# Patient Record
Sex: Female | Born: 1940 | Race: White | Hispanic: No | Smoking: Never smoker
Health system: Southern US, Community
[De-identification: ages and names within clinical notes are randomized; demographics above are authoritative.]

## PROBLEM LIST (undated history)

## (undated) DIAGNOSIS — I10 Essential (primary) hypertension: Secondary | ICD-10-CM

## (undated) DIAGNOSIS — I2510 Atherosclerotic heart disease of native coronary artery without angina pectoris: Secondary | ICD-10-CM

## (undated) DIAGNOSIS — T7840XA Allergy, unspecified, initial encounter: Secondary | ICD-10-CM

## (undated) DIAGNOSIS — E119 Type 2 diabetes mellitus without complications: Secondary | ICD-10-CM

## (undated) DIAGNOSIS — I5020 Unspecified systolic (congestive) heart failure: Secondary | ICD-10-CM

## (undated) DIAGNOSIS — R011 Cardiac murmur, unspecified: Secondary | ICD-10-CM

## (undated) DIAGNOSIS — Z923 Personal history of irradiation: Secondary | ICD-10-CM

## (undated) DIAGNOSIS — C50919 Malignant neoplasm of unspecified site of unspecified female breast: Secondary | ICD-10-CM

## (undated) DIAGNOSIS — B019 Varicella without complication: Secondary | ICD-10-CM

## (undated) HISTORY — DX: Varicella without complication: B01.9

## (undated) HISTORY — DX: Type 2 diabetes mellitus without complications: E11.9

## (undated) HISTORY — DX: Atherosclerotic heart disease of native coronary artery without angina pectoris: I25.10

## (undated) HISTORY — DX: Unspecified systolic (congestive) heart failure: I50.20

## (undated) HISTORY — DX: Allergy, unspecified, initial encounter: T78.40XA

## (undated) HISTORY — DX: Essential (primary) hypertension: I10

## (undated) HISTORY — PX: CARDIAC CATHETERIZATION: SHX172

## (undated) HISTORY — DX: Cardiac murmur, unspecified: R01.1

---

## 2014-07-15 ENCOUNTER — Telehealth: Payer: Self-pay | Admitting: *Deleted

## 2014-07-15 ENCOUNTER — Encounter: Payer: Self-pay | Admitting: Cardiovascular Disease

## 2014-07-15 ENCOUNTER — Ambulatory Visit (INDEPENDENT_AMBULATORY_CARE_PROVIDER_SITE_OTHER): Payer: 59 | Admitting: Cardiovascular Disease

## 2014-07-15 VITALS — BP 198/102 | HR 82 | Ht 65.0 in | Wt 165.8 lb

## 2014-07-15 DIAGNOSIS — I10 Essential (primary) hypertension: Secondary | ICD-10-CM | POA: Insufficient documentation

## 2014-07-15 DIAGNOSIS — R079 Chest pain, unspecified: Secondary | ICD-10-CM

## 2014-07-15 MED ORDER — AMLODIPINE BESYLATE 5 MG PO TABS: 5.0000 mg | ORAL_TABLET | Freq: Every day | ORAL | Status: DC

## 2014-07-15 NOTE — Assessment & Plan Note (Signed)
The patient has exertional chest pain and shortness of breath which is worrisome for new onset angina. She does have nonspecific ST depression on EKG. She is not able to exercise on a treadmill and thus I requested a pharmacologic nuclear stress test.

## 2014-07-15 NOTE — Patient Instructions (Addendum)
[place]  Your caregiver has ordered a Stress Test with nuclear imaging. The purpose of this test is to evaluate the blood supply to your heart muscle. This procedure is referred to as a "Non-Invasive Stress Test." This is because other than having an IV started in your vein, nothing is inserted or "invades" your body. Cardiac stress tests are done to find areas of poor blood flow to the heart by determining the extent of coronary artery disease (CAD). Some patients exercise on a treadmill, which naturally increases the blood flow to your heart, while others who are  unable to walk on a treadmill due to physical limitations have a pharmacologic/chemical stress agent called Lexiscan . This medicine will mimic walking on a treadmill by temporarily increasing your coronary blood flow.   Please note: these test may take anywhere between 2-4 hours to complete  PLEASE REPORT TO [facility] AT THE FIRST DESK WILL DIRECT YOU WHERE TO GO  Date of Procedure:______12/08/15_______________________________  Arrival Time for Procedure:_________0945 am_____________________  Instructions regarding medication:     PLEASE NOTIFY THE OFFICE AT LEAST 24 HOURS IN ADVANCE IF YOU ARE UNABLE TO KEEP YOUR APPOINTMENT.  [phone] AND  PLEASE NOTIFY NUCLEAR MEDICINE AT [facility] AT LEAST 24 HOURS IN ADVANCE IF YOU ARE UNABLE TO KEEP YOUR APPOINTMENT. [phone]  How to prepare for your Myoview test:  1. Do not eat or drink after midnight 2. No caffeine for 24 hours prior to test 3. No smoking 24 hours prior to test. 4. Your medication may be taken with water.  If your doctor stopped a medication because of this test, do not take that medication. 5. Ladies, please do not wear dresses.  Skirts or pants are appropriate. Please wear a short sleeve shirt. 6. No perfume, cologne or lotion. 7. Wear comfortable walking shoes. No heels!       Your physician has recommended you make the  following change in your medication:  Start Amlodipine 5 mg once daily   Your physician wants you to follow-up in: After your stress test

## 2014-07-15 NOTE — Assessment & Plan Note (Signed)
Her blood pressure is significantly elevated. This might be contributing to her chest pain. I added amlodipine 5 mg once daily. She is to follow-up after stress test.

## 2014-07-15 NOTE — Telephone Encounter (Signed)
LVM to schedule patient for follow up appt after stress test with Dr. Fletcher Anon to assess her blood pressure

## 2014-07-15 NOTE — Progress Notes (Signed)
  Primary care physician: Dr. Laurian Brim  HPI  This is a 73 year old female who was referred for evaluation of chest pain. She has no previous cardiac history. She has known history of hypertension. She is not sure about hyperlipidemia. She is not diabetic and there is no history of smoking. She has no family history of coronary artery disease. Over the last 2 weeks, she has experienced recurrent episode of substernal chest tightness with activities but not all the times. It lasts 2-5 minutes and it goes away with rest. It is associated with dyspnea.  She denies orthopnea, PND or lower extremity edema. She reports no previous cardiac workup. She has no chest pain at rest.   No Known Allergies   No current outpatient prescriptions on file prior to visit.   No current facility-administered medications on file prior to visit.     Past Medical History  Diagnosis Date  . Hypertension   . Heart murmur      History reviewed. No pertinent past surgical history.   Family History  Problem Relation Age of Onset  . Heart failure Mother      History   Social History  . Marital Status: Widowed    Spouse Name: N/A    Number of Children: N/A  . Years of Education: N/A   Occupational History  . Not on file.   Social History Main Topics  . Smoking status: Never Smoker   . Smokeless tobacco: Not on file  . Alcohol Use: No  . Drug Use: No  . Sexual Activity: Not on file   Other Topics Concern  . Not on file   Social History Narrative  . No narrative on file     ROS A 10 point review of system was performed. It is negative other than that mentioned in the history of present illness.   PHYSICAL EXAM   BP 198/102 mmHg  Pulse 82  Ht 5\' 5"  (1.651 m)  Wt 165 lb 12 oz (75.184 kg)  BMI 27.58 kg/m2 Constitutional: She is oriented to person, place, and time. She appears well-developed and well-nourished. No distress.  HENT: No nasal discharge.  Head: Normocephalic and  atraumatic.  Eyes: Pupils are equal and round. No discharge.  Neck: Normal range of motion. Neck supple. No JVD present. No thyromegaly present.  Cardiovascular: Normal rate, regular rhythm, normal heart sounds. Exam reveals no gallop and no friction rub. No murmur heard.  Pulmonary/Chest: Effort normal and breath sounds normal. No stridor. No respiratory distress. She has no wheezes. She has no rales. She exhibits no tenderness.  Abdominal: Soft. Bowel sounds are normal. She exhibits no distension. There is no tenderness. There is no rebound and no guarding.  Musculoskeletal: Normal range of motion. She exhibits no edema and no tenderness.  Neurological: She is alert and oriented to person, place, and time. Coordination normal.  Skin: Skin is warm and dry. No rash noted. She is not diaphoretic. No erythema. No pallor.  Psychiatric: She has a normal mood and affect. Her behavior is normal. Judgment and thought content normal.     [id number]  Rhythm  -Nonspecific ST depression  -Nondiagnostic.   ABNORMAL     ASSESSMENT AND PLAN

## 2014-07-19 ENCOUNTER — Ambulatory Visit: Payer: Self-pay | Admitting: Cardiovascular Disease

## 2014-07-19 DIAGNOSIS — R079 Chest pain, unspecified: Secondary | ICD-10-CM

## 2014-07-20 ENCOUNTER — Other Ambulatory Visit: Payer: Self-pay

## 2014-07-20 DIAGNOSIS — R079 Chest pain, unspecified: Secondary | ICD-10-CM

## 2014-08-11 ENCOUNTER — Encounter: Payer: Self-pay | Admitting: Cardiovascular Disease

## 2014-08-11 ENCOUNTER — Ambulatory Visit (INDEPENDENT_AMBULATORY_CARE_PROVIDER_SITE_OTHER): Payer: Medicare PPO | Admitting: Cardiovascular Disease

## 2014-08-11 VITALS — BP 158/86 | HR 83 | Ht 66.0 in | Wt 163.5 lb

## 2014-08-11 DIAGNOSIS — R0789 Other chest pain: Secondary | ICD-10-CM

## 2014-08-11 DIAGNOSIS — I10 Essential (primary) hypertension: Secondary | ICD-10-CM

## 2014-08-11 NOTE — Assessment & Plan Note (Signed)
She reports resolution of chest pain. Nuclear stress test was normal. Continue observation.

## 2014-08-11 NOTE — Progress Notes (Signed)
Primary care physician: Dr. Laurian Brim  HPI  This is a 73 year old female who is here today for follow-up visit regarding chest pain. She has no previous cardiac history. She has known history of hypertension. She is not sure about hyperlipidemia. She is not diabetic and there is no history of smoking. She has no family history of coronary artery disease. She was seen recently for recurrent episode of substernal chest tightness with activities but not all the times.  She was noted to be severely hypertensive with systolic blood pressure greater than 190. I added amlodipine 5 mg once daily. She underwent a pharmacologic nuclear stress test which showed no evidence of ischemia with normal ejection fraction. She reports feeling better and resolution of chest pain.  No Known Allergies   Current Outpatient Prescriptions on File Prior to Visit  Medication Sig Dispense Refill  . amLODipine (NORVASC) 5 MG tablet Take 1 tablet (5 mg total) by mouth daily. 180 tablet 3  . aspirin 81 MG tablet Take 81 mg by mouth daily.    Marland Kitchen atenolol (TENORMIN) 50 MG tablet Take 50 mg by mouth 2 (two) times daily.  3  . hydrochlorothiazide (HYDRODIURIL) 25 MG tablet Take 25 mg by mouth daily.     No current facility-administered medications on file prior to visit.     Past Medical History  Diagnosis Date  . Hypertension   . Heart murmur      History reviewed. No pertinent past surgical history.   Family History  Problem Relation Age of Onset  . Heart failure Mother      History   Social History  . Marital Status: Married    Spouse Name: N/A    Number of Children: N/A  . Years of Education: N/A   Occupational History  . Not on file.   Social History Main Topics  . Smoking status: Never Smoker   . Smokeless tobacco: Not on file  . Alcohol Use: No  . Drug Use: No  . Sexual Activity: Not on file   Other Topics Concern  . Not on file   Social History Narrative     ROS A 10 point  review of system was performed. It is negative other than that mentioned in the history of present illness.   PHYSICAL EXAM   BP 158/86 mmHg  Pulse 83  Ht 5\' 6"  (1.676 m)  Wt 163 lb 8 oz (74.163 kg)  BMI 26.40 kg/m2 Constitutional: She is oriented to person, place, and time. She appears well-developed and well-nourished. No distress.  HENT: No nasal discharge.  Head: Normocephalic and atraumatic.  Eyes: Pupils are equal and round. No discharge.  Neck: Normal range of motion. Neck supple. No JVD present. No thyromegaly present.  Cardiovascular: Normal rate, regular rhythm, normal heart sounds. Exam reveals no gallop and no friction rub. No murmur heard.  Pulmonary/Chest: Effort normal and breath sounds normal. No stridor. No respiratory distress. She has no wheezes. She has no rales. She exhibits no tenderness.  Abdominal: Soft. Bowel sounds are normal. She exhibits no distension. There is no tenderness. There is no rebound and no guarding.  Musculoskeletal: Normal range of motion. She exhibits no edema and no tenderness.  Neurological: She is alert and oriented to person, place, and time. Coordination normal.  Skin: Skin is warm and dry. No rash noted. She is not diaphoretic. No erythema. No pallor.  Psychiatric: She has a normal mood and affect. Her behavior is normal. Judgment and thought content normal.  ASSESSMENT AND PLAN

## 2014-08-11 NOTE — Assessment & Plan Note (Signed)
Blood pressure improved significantly after the addition of amlodipine. Continue atenolol and hydrochlorothiazide as well.

## 2014-08-11 NOTE — Patient Instructions (Signed)
Continue same medications.   Your physician wants you to follow-up in: 6 months.  You will receive a reminder letter in the mail two months in advance. If you don't receive a letter, please call our office to schedule the follow-up appointment.  

## 2015-01-18 ENCOUNTER — Ambulatory Visit (INDEPENDENT_AMBULATORY_CARE_PROVIDER_SITE_OTHER): Payer: Medicare PPO | Admitting: Cardiovascular Disease

## 2015-01-18 ENCOUNTER — Encounter: Payer: Self-pay | Admitting: Cardiovascular Disease

## 2015-01-18 VITALS — BP 148/86 | HR 70 | Ht 66.0 in | Wt 167.5 lb

## 2015-01-18 DIAGNOSIS — R0789 Other chest pain: Secondary | ICD-10-CM | POA: Diagnosis not present

## 2015-01-18 DIAGNOSIS — I10 Essential (primary) hypertension: Secondary | ICD-10-CM

## 2015-01-18 NOTE — Assessment & Plan Note (Signed)
Blood pressure is reasonably controlled on current medications. It is slightly elevated. We can consider adding an ACE inhibitor/ARB or switching atenolol to carvedilol.

## 2015-01-18 NOTE — Patient Instructions (Signed)
Medication Instructions: Stop taking Aspirin. Continue other medications.   Labwork: None.   Procedures/Testing: None.   Follow-Up: 6 months with Dr. Fletcher Anon  Any Additional Special Instructions Will Be Listed Below (If Applicable).

## 2015-01-18 NOTE — Progress Notes (Signed)
Primary care physician: Dr. Laurian Brim  HPI  This is a 74 year old female who is here today for follow-up visit regarding chest pain. She has no previous cardiac history. She has known history of hypertension. She is not sure about hyperlipidemia. She is not diabetic and there is no history of smoking. She has no family history of coronary artery disease. She was seen last year for recurrent episode of substernal chest tightness with activities but not all the times.  She was noted to be severely hypertensive with systolic blood pressure greater than 190. I added amlodipine 5 mg once daily. She underwent a pharmacologic nuclear stress in December 2015 test which showed no evidence of ischemia with normal ejection fraction. No recurrent chest pain since then. She has been doing well.  No Known Allergies   Current Outpatient Prescriptions on File Prior to Visit  Medication Sig Dispense Refill  . amLODipine (NORVASC) 5 MG tablet Take 1 tablet (5 mg total) by mouth daily. 180 tablet 3  . aspirin 81 MG tablet Take 81 mg by mouth daily.    Marland Kitchen atenolol (TENORMIN) 50 MG tablet Take 50 mg by mouth 2 (two) times daily.  3  . hydrochlorothiazide (HYDRODIURIL) 25 MG tablet Take 25 mg by mouth daily.     No current facility-administered medications on file prior to visit.     Past Medical History  Diagnosis Date  . Hypertension   . Heart murmur      History reviewed. No pertinent past surgical history.   Family History  Problem Relation Age of Onset  . Heart failure Mother      History   Social History  . Marital Status: Married    Spouse Name: N/A  . Number of Children: N/A  . Years of Education: N/A   Occupational History  . Not on file.   Social History Main Topics  . Smoking status: Never Smoker   . Smokeless tobacco: Not on file  . Alcohol Use: No  . Drug Use: No  . Sexual Activity: Not on file   Other Topics Concern  . Not on file   Social History Narrative      ROS A 10 point review of system was performed. It is negative other than that mentioned in the history of present illness.   PHYSICAL EXAM   BP 148/86 mmHg  Pulse 70  Ht 5\' 6"  (1.676 m)  Wt 167 lb 8 oz (75.978 kg)  BMI 27.05 kg/m2 Constitutional: She is oriented to person, place, and time. She appears well-developed and well-nourished. No distress.  HENT: No nasal discharge.  Head: Normocephalic and atraumatic.  Eyes: Pupils are equal and round. No discharge.  Neck: Normal range of motion. Neck supple. No JVD present. No thyromegaly present.  Cardiovascular: Normal rate, regular rhythm, normal heart sounds. Exam reveals no gallop and no friction rub. No murmur heard.  Pulmonary/Chest: Effort normal and breath sounds normal. No stridor. No respiratory distress. She has no wheezes. She has no rales. She exhibits no tenderness.  Abdominal: Soft. Bowel sounds are normal. She exhibits no distension. There is no tenderness. There is no rebound and no guarding.  Musculoskeletal: Normal range of motion. She exhibits no edema and no tenderness.  Neurological: She is alert and oriented to person, place, and time. Coordination normal.  Skin: Skin is warm and dry. No rash noted. She is not diaphoretic. No erythema. No pallor.  Psychiatric: She has a normal mood and affect. Her behavior is normal.  Judgment and thought content normal.     [id number]  Rhythm  -  Nonspecific T-abnormality.   ABNORMAL    ASSESSMENT AND PLAN

## 2015-01-18 NOTE — Assessment & Plan Note (Signed)
No recurrent symptoms of chest pain since controlling her blood pressure. I advised her to discontinue aspirin.

## 2015-07-21 ENCOUNTER — Encounter: Payer: Self-pay | Admitting: Cardiovascular Disease

## 2015-07-21 ENCOUNTER — Ambulatory Visit (INDEPENDENT_AMBULATORY_CARE_PROVIDER_SITE_OTHER): Payer: Medicare PPO | Admitting: Cardiovascular Disease

## 2015-07-21 VITALS — BP 158/80 | HR 75 | Ht 64.0 in | Wt 166.5 lb

## 2015-07-21 DIAGNOSIS — E785 Hyperlipidemia, unspecified: Secondary | ICD-10-CM | POA: Diagnosis not present

## 2015-07-21 DIAGNOSIS — I10 Essential (primary) hypertension: Secondary | ICD-10-CM | POA: Diagnosis not present

## 2015-07-21 MED ORDER — CARVEDILOL 12.5 MG PO TABS: 12.5000 mg | ORAL_TABLET | Freq: Two times a day (BID) | ORAL | Status: DC

## 2015-07-21 NOTE — Progress Notes (Signed)
  Primary care physician: Dr. Laurian Brim  HPI  This is a 74 year old female who is here today for follow-up visit regarding hypertension .  She is not diabetic and there is no history of smoking. She has no family history of coronary artery disease. She had a normal nuclear stress test in December 2015 which was done for atypical chest pain.  We have been managing her hypertension. Amlodipine was added last year. Blood pressure continues to be elevated. She denies any recurrent chest pain. She has occasional episodes of right arm numbness. She takes care of her 78 year old son who has cerebral palsy.  No Known Allergies   Current Outpatient Prescriptions on File Prior to Visit  Medication Sig Dispense Refill  . amLODipine (NORVASC) 5 MG tablet Take 1 tablet (5 mg total) by mouth daily. 180 tablet 3  . atenolol (TENORMIN) 50 MG tablet Take 50 mg by mouth 2 (two) times daily.  3  . hydrochlorothiazide (HYDRODIURIL) 25 MG tablet Take 25 mg by mouth daily.     No current facility-administered medications on file prior to visit.     Past Medical History  Diagnosis Date  . Hypertension   . Heart murmur      History reviewed. No pertinent past surgical history.   Family History  Problem Relation Age of Onset  . Heart failure Mother      Social History   Social History  . Marital Status: Married    Spouse Name: N/A  . Number of Children: N/A  . Years of Education: N/A   Occupational History  . Not on file.   Social History Main Topics  . Smoking status: Never Smoker   . Smokeless tobacco: Not on file  . Alcohol Use: No  . Drug Use: No  . Sexual Activity: Not on file   Other Topics Concern  . Not on file   Social History Narrative     ROS A 10 point review of system was performed. It is negative other than that mentioned in the history of present illness.   PHYSICAL EXAM   BP 158/80 mmHg  Pulse 75  Ht 5\' 4"  (1.626 m)  Wt 166 lb 8 oz (75.524 kg)   BMI 28.57 kg/m2 Constitutional: She is oriented to person, place, and time. She appears well-developed and well-nourished. No distress.  HENT: No nasal discharge.  Head: Normocephalic and atraumatic.  Eyes: Pupils are equal and round. No discharge.  Neck: Normal range of motion. Neck supple. No JVD present. No thyromegaly present.  Cardiovascular: Normal rate, regular rhythm, normal heart sounds. Exam reveals no gallop and no friction rub. No murmur heard.  Pulmonary/Chest: Effort normal and breath sounds normal. No stridor. No respiratory distress. She has no wheezes. She has no rales. She exhibits no tenderness.  Abdominal: Soft. Bowel sounds are normal. She exhibits no distension. There is no tenderness. There is no rebound and no guarding.  Musculoskeletal: Normal range of motion. She exhibits no edema and no tenderness.  Neurological: She is alert and oriented to person, place, and time. Coordination normal.  Skin: Skin is warm and dry. No rash noted. She is not diaphoretic. No erythema. No pallor.  Psychiatric: She has a normal mood and affect. Her behavior is normal. Judgment and thought content normal.     [id number]  Rhythm  -  Nonspecific T-abnormality.   ABNORMAL    ASSESSMENT AND PLAN

## 2015-07-21 NOTE — Assessment & Plan Note (Signed)
Blood pressure continues to be elevated. Thus, I elected to switch atenolol to carvedilol 12.5 mg twice daily. The patient has not had any recent labs done and I requested basic metabolic profile, fasting lipid and liver profile.

## 2015-07-21 NOTE — Patient Instructions (Signed)
Medication Instructions:  Your physician has recommended you make the following change in your medication:  STOP taking atenolol START taking coreg 12.5mg  twice per day   Labwork: Fasting liver and lipid profile and BMET  Testing/Procedures: none  Follow-Up: Your physician wants you to follow-up in: 6 months with Dr. Fletcher Anon.  You will receive a reminder letter in the mail two months in advance. If you don't receive a letter, please call our office to schedule the follow-up appointment.   Any Other Special Instructions Will Be Listed Below (If Applicable).     If you need a refill on your cardiac medications before your next appointment, please call your pharmacy.

## 2015-08-15 ENCOUNTER — Other Ambulatory Visit (INDEPENDENT_AMBULATORY_CARE_PROVIDER_SITE_OTHER): Payer: Medicare PPO | Admitting: *Deleted

## 2015-08-15 DIAGNOSIS — I10 Essential (primary) hypertension: Secondary | ICD-10-CM

## 2015-08-15 DIAGNOSIS — E785 Hyperlipidemia, unspecified: Secondary | ICD-10-CM

## 2015-08-16 LAB — LIPID PANEL
CHOLESTEROL TOTAL: 274 mg/dL — AB (ref 100–199)
Chol/HDL Ratio: 7.2 ratio units — ABNORMAL HIGH (ref 0.0–4.4)
HDL: 38 mg/dL — ABNORMAL LOW (ref >39)
LDL Calculated: 184 mg/dL — ABNORMAL HIGH (ref 0–99)
TRIGLYCERIDES: 260 mg/dL — AB (ref 0–149)
VLDL CHOLESTEROL CAL: 52 mg/dL — AB (ref 5–40)

## 2015-08-16 LAB — HEPATIC FUNCTION PANEL
ALBUMIN: 4.3 g/dL (ref 3.5–4.8)
ALK PHOS: 79 IU/L (ref 39–117)
ALT: 26 IU/L (ref 0–32)
AST: 22 IU/L (ref 0–40)
BILIRUBIN TOTAL: 0.6 mg/dL (ref 0.0–1.2)
Bilirubin, Direct: 0.14 mg/dL (ref 0.00–0.40)
Total Protein: 7 g/dL (ref 6.0–8.5)

## 2015-08-16 LAB — BASIC METABOLIC PANEL
BUN / CREAT RATIO: 11 (ref 11–26)
BUN: 11 mg/dL (ref 8–27)
CALCIUM: 9.2 mg/dL (ref 8.7–10.3)
CHLORIDE: 96 mmol/L (ref 96–106)
CO2: 25 mmol/L (ref 18–29)
Creatinine, Ser: 0.96 mg/dL (ref 0.57–1.00)
GFR calc Af Amer: 67 mL/min/{1.73_m2} (ref >59)
GFR calc non Af Amer: 58 mL/min/{1.73_m2} — ABNORMAL LOW (ref >59)
GLUCOSE: 144 mg/dL — AB (ref 65–99)
Potassium: 3.4 mmol/L — ABNORMAL LOW (ref 3.5–5.2)
Sodium: 140 mmol/L (ref 134–144)

## 2015-08-18 ENCOUNTER — Other Ambulatory Visit: Payer: Self-pay

## 2015-08-18 ENCOUNTER — Telehealth: Payer: Self-pay

## 2015-08-18 MED ORDER — POTASSIUM CHLORIDE CRYS ER 20 MEQ PO TBCR: 20.0000 meq | EXTENDED_RELEASE_TABLET | Freq: Every day | ORAL | Status: DC

## 2015-08-18 NOTE — Telephone Encounter (Signed)
Preliminary review of pt labs. Potassium 3.4 Made Janice Liu aware as Dr. Fletcher Anon is out of the office. Per verbal from [place], start potassium 35mEq daily. S/w pt of recommendations. Pt verbalized understanding and is agreeable w/plan Prescription submitted to pharmacy.

## 2015-08-29 ENCOUNTER — Ambulatory Visit (INDEPENDENT_AMBULATORY_CARE_PROVIDER_SITE_OTHER): Payer: Medicare PPO | Admitting: Internal Medicine

## 2015-08-29 ENCOUNTER — Encounter: Payer: Self-pay | Admitting: Internal Medicine

## 2015-08-29 VITALS — BP 150/96 | HR 92 | Temp 98.2°F

## 2015-08-29 DIAGNOSIS — Z91048 Other nonmedicinal substance allergy status: Secondary | ICD-10-CM

## 2015-08-29 DIAGNOSIS — Z9109 Other allergy status, other than to drugs and biological substances: Secondary | ICD-10-CM | POA: Insufficient documentation

## 2015-08-29 DIAGNOSIS — I10 Essential (primary) hypertension: Secondary | ICD-10-CM

## 2015-08-29 NOTE — Assessment & Plan Note (Signed)
BP not at goal ? If Dr. Fletcher Anon should go up on her Amlodipine but will defer to him Continue all current medications at this time

## 2015-08-29 NOTE — Progress Notes (Signed)
HPI  Pt presents to the clinic today to establish care. She is transferring care from Dr. Hardin Negus.  HTN: Her BP today is 150/96. She is on Amlodipine and HCTZ. She follows with Dr. Fletcher Anon, her last appt was 2 weeks ago. Dr. Hardin Negus stopped her Atenolol and put her on Carvedilol. He also added a Potassium supplement to her regimen. She follows up with him in 6 months.  Environmental Allergies: She takes Claritin daily. It seems to help her symptoms.  Past Medical History  Diagnosis Date  . Hypertension   . Heart murmur   . Chicken pox   . Allergy     Current Outpatient Prescriptions  Medication Sig Dispense Refill  . amLODipine (NORVASC) 5 MG tablet Take 1 tablet (5 mg total) by mouth daily. 180 tablet 3  . carvedilol (COREG) 12.5 MG tablet Take 1 tablet (12.5 mg total) by mouth 2 (two) times daily. 60 tablet 6  . hydrochlorothiazide (HYDRODIURIL) 25 MG tablet Take 25 mg by mouth daily.    . potassium chloride SA (K-DUR,KLOR-CON) 20 MEQ tablet Take 1 tablet (20 mEq total) by mouth daily. 30 tablet 3   No current facility-administered medications for this visit.    No Known Allergies  Family History  Problem Relation Age of Onset  . Heart failure Mother   . Diabetes Mother   . Hypertension Mother   . Congestive Heart Failure Brother   . Hypertension Brother   . Cerebral palsy Son   . Congestive Heart Failure Brother     Social History   Social History  . Marital Status: Widowed    Spouse Name: N/A  . Number of Children: N/A  . Years of Education: N/A   Occupational History  . Not on file.   Social History Main Topics  . Smoking status: Never Smoker   . Smokeless tobacco: Not on file  . Alcohol Use: No  . Drug Use: No  . Sexual Activity: Not on file   Other Topics Concern  . Not on file   Social History Narrative    ROS:  Constitutional: Denies fever, malaise, fatigue, headache or abrupt weight changes.  HEENT: Denies eye pain, eye redness, ear pain,  ringing in the ears, wax buildup, runny nose, nasal congestion, bloody nose, or sore throat. Respiratory: Denies difficulty breathing, shortness of breath, cough or sputum production. Cardiovascular: Denies chest pain, chest tightness, palpitations or swelling in the hands or feet.  Neurological: Denies dizziness, difficulty with memory, difficulty with speech or problems with balance and coordination.  Psych: Denies anxiety, depression, SI/HI.  No other specific complaints in a complete review of systems (except as listed in HPI above).  PE:  BP 150/96 mmHg  Pulse 92  Temp(Src) 98.2 F (36.8 C) (Oral)  SpO2 97% Wt Readings from Last 3 Encounters:  07/21/15 166 lb 8 oz (75.524 kg)  01/18/15 167 lb 8 oz (75.978 kg)  08/11/14 163 lb 8 oz (74.163 kg)    General: Appears her stated age, in NAD. Skin: Dry and intact. Cardiovascular: Normal rate and rhythm. S1,S2 noted. Murmur noted.  No rubs or gallops noted.  Pulmonary/Chest: Normal effort and positive vesicular breath sounds. No respiratory distress. No wheezes, rales or ronchi noted.  Neurological: Alert and oriented.  Psychiatric: Mood and affect normal. Behavior is normal. Judgment and thought content normal.    BMET    Component Value Date/Time   NA 140 08/15/2015 0803   K 3.4* 08/15/2015 0803   CL 96 08/15/2015  0803   CO2 25 08/15/2015 0803   GLUCOSE 144* 08/15/2015 0803   BUN 11 08/15/2015 0803   CREATININE 0.96 08/15/2015 0803   CALCIUM 9.2 08/15/2015 0803   GFRNONAA 58* 08/15/2015 0803   GFRAA 67 08/15/2015 0803    Lipid Panel     Component Value Date/Time   CHOL 274* 08/15/2015 0803   TRIG 260* 08/15/2015 0803   HDL 38* 08/15/2015 0803   CHOLHDL 7.2* 08/15/2015 0803   LDLCALC 184* 08/15/2015 0803    CBC No results found for: WBC, RBC, HGB, HCT, PLT, MCV, MCH, MCHC, RDW, LYMPHSABS, MONOABS, EOSABS, BASOSABS  Hgb A1C No results found for: HGBA1C   Assessment and Plan:

## 2015-08-29 NOTE — Patient Instructions (Signed)
Hypertension Hypertension, commonly called high blood pressure, is when the force of blood pumping through your arteries is too strong. Your arteries are the blood vessels that carry blood from your heart throughout your body. A blood pressure reading consists of a higher number over a lower number, such as 110/72. The higher number (systolic) is the pressure inside your arteries when your heart pumps. The lower number (diastolic) is the pressure inside your arteries when your heart relaxes. Ideally you want your blood pressure below 120/80. Hypertension forces your heart to work harder to pump blood. Your arteries may become narrow or stiff. Having untreated or uncontrolled hypertension can cause heart attack, stroke, kidney disease, and other problems. RISK FACTORS Some risk factors for high blood pressure are controllable. Others are not.  Risk factors you cannot control include:   Race. You may be at higher risk if you are African American.  Age. Risk increases with age.  Gender. Men are at higher risk than women before age 45 years. After age 65, women are at higher risk than men. Risk factors you can control include:  Not getting enough exercise or physical activity.  Being overweight.  Getting too much fat, sugar, calories, or salt in your diet.  Drinking too much alcohol. SIGNS AND SYMPTOMS Hypertension does not usually cause signs or symptoms. Extremely high blood pressure (hypertensive crisis) may cause headache, anxiety, shortness of breath, and nosebleed. DIAGNOSIS To check if you have hypertension, your health care provider will measure your blood pressure while you are seated, with your arm held at the level of your heart. It should be measured at least twice using the same arm. Certain conditions can cause a difference in blood pressure between your right and left arms. A blood pressure reading that is higher than normal on one occasion does not mean that you need treatment. If  it is not clear whether you have high blood pressure, you may be asked to return on a different day to have your blood pressure checked again. Or, you may be asked to monitor your blood pressure at home for 1 or more weeks. TREATMENT Treating high blood pressure includes making lifestyle changes and possibly taking medicine. Living a healthy lifestyle can help lower high blood pressure. You may need to change some of your habits. Lifestyle changes may include:  Following the DASH diet. This diet is high in fruits, vegetables, and whole grains. It is low in salt, red meat, and added sugars.  Keep your sodium intake below 2,300 mg per day.  Getting at least 30-45 minutes of aerobic exercise at least 4 times per week.  Losing weight if necessary.  Not smoking.  Limiting alcoholic beverages.  Learning ways to reduce stress. Your health care provider may prescribe medicine if lifestyle changes are not enough to get your blood pressure under control, and if one of the following is true:  You are 18-59 years of age and your systolic blood pressure is above 140.  You are 60 years of age or older, and your systolic blood pressure is above 150.  Your diastolic blood pressure is above 90.  You have diabetes, and your systolic blood pressure is over 140 or your diastolic blood pressure is over 90.  You have kidney disease and your blood pressure is above 140/90.  You have heart disease and your blood pressure is above 140/90. Your personal target blood pressure may vary depending on your medical conditions, your age, and other factors. HOME CARE INSTRUCTIONS    Have your blood pressure rechecked as directed by your health care provider.   Take medicines only as directed by your health care provider. Follow the directions carefully. Blood pressure medicines must be taken as prescribed. The medicine does not work as well when you skip doses. Skipping doses also puts you at risk for  problems.  Do not smoke.   Monitor your blood pressure at home as directed by your health care provider. SEEK MEDICAL CARE IF:   You think you are having a reaction to medicines taken.  You have recurrent headaches or feel dizzy.  You have swelling in your ankles.  You have trouble with your vision. SEEK IMMEDIATE MEDICAL CARE IF:  You develop a severe headache or confusion.  You have unusual weakness, numbness, or feel faint.  You have severe chest or abdominal pain.  You vomit repeatedly.  You have trouble breathing. MAKE SURE YOU:   Understand these instructions.  Will watch your condition.  Will get help right away if you are not doing well or get worse.   This information is not intended to replace advice given to you by your health care provider. Make sure you discuss any questions you have with your health care provider.   Document Released: 07/29/2005 Document Revised: 12/13/2014 Document Reviewed: 05/21/2013 Elsevier Interactive Patient Education 2016 [employer] Inc[employer]  

## 2015-08-29 NOTE — Progress Notes (Signed)
Pre visit review using our clinic review tool, if applicable. No additional management support is needed unless otherwise documented below in the visit note. 

## 2015-08-29 NOTE — Assessment & Plan Note (Signed)
Continue Claritin daily.

## 2015-09-08 ENCOUNTER — Other Ambulatory Visit: Payer: Self-pay | Admitting: *Deleted

## 2015-09-08 MED ORDER — AMLODIPINE BESYLATE 5 MG PO TABS: 5.0000 mg | ORAL_TABLET | Freq: Every day | ORAL | Status: DC

## 2015-12-08 ENCOUNTER — Other Ambulatory Visit: Payer: Self-pay | Admitting: Physician Assistant

## 2016-01-18 ENCOUNTER — Ambulatory Visit: Payer: Medicare PPO | Admitting: Cardiovascular Disease

## 2016-02-07 ENCOUNTER — Other Ambulatory Visit: Payer: Self-pay | Admitting: Cardiovascular Disease

## 2016-02-15 ENCOUNTER — Encounter: Payer: Self-pay | Admitting: Cardiovascular Disease

## 2016-02-15 ENCOUNTER — Ambulatory Visit (INDEPENDENT_AMBULATORY_CARE_PROVIDER_SITE_OTHER): Payer: Medicare PPO | Admitting: Cardiovascular Disease

## 2016-02-15 VITALS — BP 168/98 | HR 85 | Ht 64.0 in | Wt 166.0 lb

## 2016-02-15 DIAGNOSIS — I10 Essential (primary) hypertension: Secondary | ICD-10-CM | POA: Diagnosis not present

## 2016-02-15 MED ORDER — CARVEDILOL 25 MG PO TABS: 25.0000 mg | ORAL_TABLET | Freq: Two times a day (BID) | ORAL | Status: DC

## 2016-02-15 MED ORDER — HYDROCHLOROTHIAZIDE 12.5 MG PO TABS: 12.5000 mg | ORAL_TABLET | Freq: Every day | ORAL | Status: DC

## 2016-02-15 NOTE — Progress Notes (Signed)
Cardiology Office Note   Date:  02/16/2016   ID:  Janice Liu, DOB [birth year], MRN [id number]  PCP:  Webb Silversmith, NP  Cardiologist:   Kathlyn Sacramento, MD   Chief Complaint  Patient presents with  . other    6 month f/u c/o hctz making her use bathroom to much. Meds reviewed verbally with pt.      History of Present Illness: Janice Liu is a 75 y.o. female who presents for a follow-up visit regarding hypertension .  She is not diabetic and there is no history of smoking. She has no family history of coronary artery disease. She had a normal nuclear stress test in December 2015 which was done for atypical chest pain.  We have been managing her hypertension. Amlodipine was added. Atenolol was switched to carvedilol. She ran out of hydrochlorothiazide about 3 weeks ago and did not refill it as it was making her go to the bathroom too much. She has been doing reasonably well and denies any chest pain or shortness of breath. Blood pressure continues to be elevated. She takes care of her 44 year old son who has cerebral palsy.   Past Medical History  Diagnosis Date  . Hypertension   . Heart murmur   . Chicken pox   . Allergy     No past surgical history on file.   Current Outpatient Prescriptions  Medication Sig Dispense Refill  . amLODipine (NORVASC) 5 MG tablet Take 1 tablet (5 mg total) by mouth daily. 180 tablet 3  . hydrochlorothiazide (HYDRODIURIL) 12.5 MG tablet Take 1 tablet (12.5 mg total) by mouth daily. 30 tablet 5  . KLOR-CON M20 20 MEQ tablet TAKE 1 TABLET (20 MEQ TOTAL) BY MOUTH DAILY. 30 tablet 3  . carvedilol (COREG) 25 MG tablet Take 1 tablet (25 mg total) by mouth 2 (two) times daily. 60 tablet 5   No current facility-administered medications for this visit.    Allergies:   Review of patient's allergies indicates no known allergies.    Social History:  The patient  reports that she has never smoked. She does not have any smokeless tobacco history  on file. She reports that she does not drink alcohol or use illicit drugs.   Family History:  The patient's family history includes Cerebral palsy in her son; Congestive Heart Failure in her brother and brother; Diabetes in her mother; Heart failure in her mother; Hypertension in her brother and mother.    ROS:  Please see the history of present illness.   Otherwise, review of systems are positive for none.   All other systems are reviewed and negative.    PHYSICAL EXAM: VS:  BP 168/98 mmHg  Pulse 85  Ht 5\' 4"  (1.626 m)  Wt 166 lb (75.297 kg)  BMI 28.48 kg/m2 , BMI Body mass index is 28.48 kg/(m^2). GEN: Well nourished, well developed, in no acute distress HEENT: normal Neck: no JVD, carotid bruits, or masses Cardiac: RRR; no murmurs, rubs, or gallops,no edema  Respiratory:  clear to auscultation bilaterally, normal work of breathing GI: soft, nontender, nondistended, + BS MS: no deformity or atrophy Skin: warm and dry, no rash Neuro:  Strength and sensation are intact Psych: euthymic mood, full affect   EKG:  EKG is ordered today. The ekg ordered today demonstrates normal sinus rhythm with no significant ST or T wave changes.   Recent Labs: 08/15/2015: ALT 26; BUN 11; Creatinine, Ser 0.96; Potassium 3.4*; Sodium 140  Lipid Panel    Component Value Date/Time   CHOL 274* 08/15/2015 0803   TRIG 260* 08/15/2015 0803   HDL 38* 08/15/2015 0803   CHOLHDL 7.2* 08/15/2015 0803   LDLCALC 184* 08/15/2015 0803      Wt Readings from Last 3 Encounters:  02/15/16 166 lb (75.297 kg)  07/21/15 166 lb 8 oz (75.524 kg)  01/18/15 167 lb 8 oz (75.978 kg)       ASSESSMENT AND PLAN:  1.  Essential hypertension: Blood pressure continues to be elevated. I elected to increase the dose of carvedilol to 25 mg twice daily. I also resumed hydrochlorothiazide but at a lower dose of 12.5 mg once daily given that she has been complaining of urinary frequency.  2. Hyperlipidemia: Previous  lipid profile in January showed an LDL of 184 with triglyceride of 260. She reports not being on treatment in the past. We should repeat her lipid profile within the next 6 months and consider treatment with a statin if LDL continues to be this elevated.    Disposition:   FU with me in 6 months  Signed,  Kathlyn Sacramento, MD  02/16/2016 6:29 PM    [place]

## 2016-02-15 NOTE — Patient Instructions (Addendum)
Medication Instructions:  Your physician has recommended you make the following change in your medication:  INCREASE coreg to 25mg  twice daily START taking hydrochlorothiazide 12.5mg  once daily   Labwork: none  Testing/Procedures: none  Follow-Up: Your physician wants you to follow-up in: six months with Dr. Fletcher Anon.  You will receive a reminder letter in the mail two months in advance. If you don't receive a letter, please call our office to schedule the follow-up appointment.   Any Other Special Instructions Will Be Listed Below (If Applicable).     If you need a refill on your cardiac medications before your next appointment, please call your pharmacy.

## 2016-02-27 ENCOUNTER — Encounter: Payer: Self-pay | Admitting: Internal Medicine

## 2016-02-27 ENCOUNTER — Ambulatory Visit (INDEPENDENT_AMBULATORY_CARE_PROVIDER_SITE_OTHER): Payer: Medicare PPO | Admitting: Internal Medicine

## 2016-02-27 VITALS — BP 134/80 | HR 78 | Temp 98.6°F | Ht 63.33 in | Wt 161.0 lb

## 2016-02-27 DIAGNOSIS — R7889 Finding of other specified substances, not normally found in blood: Secondary | ICD-10-CM

## 2016-02-27 DIAGNOSIS — Z0000 Encounter for general adult medical examination without abnormal findings: Secondary | ICD-10-CM

## 2016-02-27 LAB — LIPID PANEL
CHOL/HDL RATIO: 6
Cholesterol: 241 mg/dL — ABNORMAL HIGH (ref 0–200)
HDL: 39.7 mg/dL (ref >39.00)
NONHDL: 201.4
Triglycerides: 281 mg/dL — ABNORMAL HIGH (ref 0.0–149.0)
VLDL: 56.2 mg/dL — ABNORMAL HIGH (ref 0.0–40.0)

## 2016-02-27 LAB — COMPREHENSIVE METABOLIC PANEL
ALT: 21 U/L (ref 0–35)
AST: 20 U/L (ref 0–37)
Albumin: 4.3 g/dL (ref 3.5–5.2)
Alkaline Phosphatase: 76 U/L (ref 39–117)
BILIRUBIN TOTAL: 0.7 mg/dL (ref 0.2–1.2)
BUN: 12 mg/dL (ref 6–23)
CO2: 31 meq/L (ref 19–32)
CREATININE: 0.79 mg/dL (ref 0.40–1.20)
Calcium: 9.4 mg/dL (ref 8.4–10.5)
Chloride: 100 mEq/L (ref 96–112)
GFR: 75.35 mL/min (ref >60.00)
GLUCOSE: 104 mg/dL — AB (ref 70–99)
Potassium: 3.2 mEq/L — ABNORMAL LOW (ref 3.5–5.1)
Sodium: 139 mEq/L (ref 135–145)
Total Protein: 7.6 g/dL (ref 6.0–8.3)

## 2016-02-27 LAB — VITAMIN D 25 HYDROXY (VIT D DEFICIENCY, FRACTURES): VITD: 33.03 ng/mL (ref 30.00–100.00)

## 2016-02-27 LAB — LDL CHOLESTEROL, DIRECT: Direct LDL: 163 mg/dL

## 2016-02-27 NOTE — Progress Notes (Signed)
HPI:  Pt presents to the clinic today for her Medicare Wellness Exam.  Past Medical History  Diagnosis Date  . Hypertension   . Heart murmur   . Chicken pox   . Allergy     Current Outpatient Prescriptions  Medication Sig Dispense Refill  . amLODipine (NORVASC) 5 MG tablet Take 1 tablet (5 mg total) by mouth daily. 180 tablet 3  . carvedilol (COREG) 25 MG tablet Take 1 tablet (25 mg total) by mouth 2 (two) times daily. 60 tablet 5  . hydrochlorothiazide (HYDRODIURIL) 12.5 MG tablet Take 1 tablet (12.5 mg total) by mouth daily. 30 tablet 5  . KLOR-CON M20 20 MEQ tablet TAKE 1 TABLET (20 MEQ TOTAL) BY MOUTH DAILY. 30 tablet 3   No current facility-administered medications for this visit.    No Known Allergies  Family History  Problem Relation Age of Onset  . Heart failure Mother   . Diabetes Mother   . Hypertension Mother   . Congestive Heart Failure Brother   . Hypertension Brother   . Cerebral palsy Son   . Congestive Heart Failure Brother     Social History   Social History  . Marital Status: Widowed    Spouse Name: N/A  . Number of Children: N/A  . Years of Education: N/A   Occupational History  . Not on file.   Social History Main Topics  . Smoking status: Never Smoker   . Smokeless tobacco: Not on file  . Alcohol Use: No  . Drug Use: No  . Sexual Activity: No   Other Topics Concern  . Not on file   Social History Narrative    Hospitiliaztions: None  Health Maintenance:    Flu: never  Tetanus: never  Pneumovax: never  Prevnar: never  Zostavax: never  Mammogram: never  Pap Smear: never  Bone Density: never  Colon Screening: never, but did do IFOB  Eye Doctor: as needed  Dental Exam: no, dentures   Providers:   PCP: Nicki Reaper, NP-C  Cardiologist: Dr. Kirke Corin    I have personally reviewed and have noted:  1. The patient's medical and social history 2. Their use of alcohol, tobacco or illicit drugs 3. Their current medications and  supplements 4. The patient's functional ability including ADL's, fall risks, home  safety risks and hearing or visual impairment. 5. Diet and physical activities 6. Evidence for depression or mood disorder  Subjective:   Review of Systems:   Constitutional: Denies fever, malaise, fatigue, headache or abrupt weight changes.  HEENT: Pt reports blurred vision. Denies eye pain, eye redness, ear pain, ringing in the ears, wax buildup, runny nose, nasal congestion, bloody nose, or sore throat. Respiratory: Denies difficulty breathing, shortness of breath, cough or sputum production.   Cardiovascular: Denies chest pain, chest tightness, palpitations or swelling in the hands or feet.  Gastrointestinal: Denies abdominal pain, bloating, constipation, diarrhea or blood in the stool.  GU: Pt reports stress incontinence. Denies urgency, frequency, pain with urination, burning sensation, blood in urine, odor or discharge. Musculoskeletal: Denies decrease in range of motion, difficulty with gait, muscle pain or joint pain and swelling.  Skin: Denies redness, rashes, lesions or ulcercations.  Neurological: Denies dizziness, difficulty with memory, difficulty with speech or problems with balance and coordination.  Psych: Denies anxiety, depression, SI/HI.  No other specific complaints in a complete review of systems (except as listed in HPI above).  Objective:  PE:   BP 134/80 mmHg  Pulse 78  Temp(Src)  98.6 F (37 C) (Oral)  Ht 5' 3.33" (1.609 m)  Wt 161 lb (73.029 kg)  BMI 28.21 kg/m2  SpO2 97%  Wt Readings from Last 3 Encounters:  02/15/16 166 lb (75.297 kg)  07/21/15 166 lb 8 oz (75.524 kg)  01/18/15 167 lb 8 oz (75.978 kg)    General: Appears her stated age, well developed, well nourished in NAD. Skin: Warm, dry and intact. Multiple scaly lesions noted on bilateral forearms.  HEENT: Head: normal shape and size; Eyes: sclera white, no icterus, conjunctiva pink, PERRLA and EOMs intact;  Ears: Tm's gray and intact, normal light reflex; Throat/Mouth: Teeth present, mucosa pink and moist, no exudate, lesions or ulcerations noted.  Neck: Neck supple, trachea midline. No masses, lumps or thyromegaly present.  Cardiovascular: Normal rate and rhythm. S1,S2 noted.  No murmur, rubs or gallops noted. No JVD or BLE edema. No carotid bruits noted. Pulmonary/Chest: Normal effort and positive vesicular breath sounds. No respiratory distress. No wheezes, rales or ronchi noted.  Abdomen: Soft and nontender. Normal bowel sounds. No distention or masses noted. Liver, spleen and kidneys non palpable. Musculoskeletal: Normal range of motion. Strength 5/5 BUE/BLE. No signs of joint swelling.  Neurological: Alert and oriented. Cranial nerves II-XII grossly intact. Coordination normal.  Psychiatric: Mood and affect normal. Behavior is normal. Judgment and thought content normal.   EKG:  BMET    Component Value Date/Time   NA 140 08/15/2015 0803   K 3.4* 08/15/2015 0803   CL 96 08/15/2015 0803   CO2 25 08/15/2015 0803   GLUCOSE 144* 08/15/2015 0803   BUN 11 08/15/2015 0803   CREATININE 0.96 08/15/2015 0803   CALCIUM 9.2 08/15/2015 0803   GFRNONAA 58* 08/15/2015 0803   GFRAA 67 08/15/2015 0803    Lipid Panel     Component Value Date/Time   CHOL 274* 08/15/2015 0803   TRIG 260* 08/15/2015 0803   HDL 38* 08/15/2015 0803   CHOLHDL 7.2* 08/15/2015 0803   LDLCALC 184* 08/15/2015 0803    CBC No results found for: WBC, RBC, HGB, HCT, PLT, MCV, MCH, MCHC, RDW, LYMPHSABS, MONOABS, EOSABS, BASOSABS  Hgb A1C No results found for: HGBA1C    Assessment and Plan:   Medicare Annual Wellness Visit:  Diet: Encouraged her to consume a low fat, low salt diet. Physical activity: Sedentary Depression/mood screen: Negative Hearing: Intact to whispered voice Visual acuity: Grossly normal, encouragedannual eye exam  ADLs: Capable Fall risk: Recent fall. Home safety: Good Cognitive  evaluation: Intact to orientation, naming, recall and repetition EOL planning: No adv directives, full code/ I agree  Preventative Medicine:  She declines all vaccines, pap smear, mammogram, bone density and colon cancer screening. Encouraged her to make an appt with a dermatologist for a skin check. Encouraged her to consume a balanced diet and start an exercise regimen. Encouraged her to see an eye doctor annually. Will check CBC, CMET, Lipid today. She declines HIV screening.  Next appointment: 1 year

## 2016-02-27 NOTE — Patient Instructions (Signed)

## 2016-02-27 NOTE — Progress Notes (Signed)
Pre visit review using our clinic review tool, if applicable. No additional management support is needed unless otherwise documented below in the visit note. 

## 2016-02-28 LAB — CBC
HCT: 41.8 % (ref 36.0–46.0)
Hemoglobin: 14 g/dL (ref 12.0–15.0)
MCHC: 33.5 g/dL (ref 30.0–36.0)
MCV: 85.9 fl (ref 78.0–100.0)
Platelets: 385 10*3/uL (ref 150.0–400.0)
RBC: 4.86 Mil/uL (ref 3.87–5.11)
RDW: 13.7 % (ref 11.5–15.5)
WBC: 11 10*3/uL — ABNORMAL HIGH (ref 4.0–10.5)

## 2016-03-01 ENCOUNTER — Other Ambulatory Visit: Payer: Self-pay | Admitting: Internal Medicine

## 2016-03-01 MED ORDER — SIMVASTATIN 20 MG PO TABS: 20.0000 mg | ORAL_TABLET | Freq: Every day | ORAL | Status: DC

## 2016-03-01 MED ORDER — POTASSIUM CHLORIDE CRYS ER 20 MEQ PO TBCR: 20.0000 meq | EXTENDED_RELEASE_TABLET | Freq: Two times a day (BID) | ORAL | Status: DC

## 2016-03-01 NOTE — Addendum Note (Signed)
Addended by: Lurlean Nanny on: 03/01/2016 12:04 PM   Modules accepted: Orders

## 2016-05-09 ENCOUNTER — Other Ambulatory Visit: Payer: Self-pay | Admitting: Internal Medicine

## 2016-05-22 ENCOUNTER — Other Ambulatory Visit: Payer: Self-pay | Admitting: Internal Medicine

## 2016-08-13 ENCOUNTER — Other Ambulatory Visit: Payer: Self-pay | Admitting: Cardiovascular Disease

## 2016-08-19 ENCOUNTER — Encounter: Payer: Self-pay | Admitting: Cardiovascular Disease

## 2016-08-19 ENCOUNTER — Ambulatory Visit (INDEPENDENT_AMBULATORY_CARE_PROVIDER_SITE_OTHER): Payer: Medicare PPO | Admitting: Cardiovascular Disease

## 2016-08-19 VITALS — BP 148/82 | HR 83 | Ht 60.0 in | Wt 162.2 lb

## 2016-08-19 DIAGNOSIS — I10 Essential (primary) hypertension: Secondary | ICD-10-CM

## 2016-08-19 DIAGNOSIS — E785 Hyperlipidemia, unspecified: Secondary | ICD-10-CM

## 2016-08-19 NOTE — Progress Notes (Signed)
Cardiology Office Note   Date:  08/19/2016   ID:  CHALICE LEYDIG, DOB May 31, 1941, MRN [id number]  PCP:  Webb Silversmith, NP  Cardiologist:   Kathlyn Sacramento, MD   Chief Complaint  Patient presents with  . other    6 month follow up. Meds reviewed by the pt. verbally. Pt. had a spell of dizziness 2 weeks ago.       History of Present Illness: Janice Liu is a 76 y.o. female who presents for a follow-up visit regarding hypertension .  She is not diabetic and there is no history of smoking. She has no family history of coronary artery disease. She had a normal nuclear stress test in December 2015 which was done for atypical chest pain.  She takes care of her 62 year old son who has cerebral palsy. She is here for a follow-up regarding management of hypertension. During last visit, I increased carvedilol to 25 mg twice daily and resume small dose hydrochlorothiazide. Simvastatin was added by primary care physician for hyperlipidemia. She has been doing well and denies any chest pain or shortness of breath. She did have an episode of dizziness about 2 weeks ago when she stood up very quickly. She did not have syncope. No recurrent episodes.    Past Medical History:  Diagnosis Date  . Allergy   . Chicken pox   . Heart murmur   . Hypertension     History reviewed. No pertinent surgical history.   Current Outpatient Prescriptions  Medication Sig Dispense Refill  . amLODipine (NORVASC) 5 MG tablet Take 1 tablet (5 mg total) by mouth daily. 180 tablet 3  . carvedilol (COREG) 25 MG tablet TAKE 1 TABLET (25 MG TOTAL) BY MOUTH 2 (TWO) TIMES DAILY. 60 tablet 3  . hydrochlorothiazide (HYDRODIURIL) 12.5 MG tablet Take 1 tablet (12.5 mg total) by mouth daily. 30 tablet 5  . KLOR-CON M20 20 MEQ tablet TAKE 1 TABLET (20 MEQ TOTAL) BY MOUTH 2 (TWO) TIMES DAILY. 60 tablet 4  . simvastatin (ZOCOR) 20 MG tablet Take 1 tablet (20 mg total) by mouth at bedtime. MUST SCHEDULE A LAB ONLY  APPOINTMENT 30 tablet 1   No current facility-administered medications for this visit.     Allergies:   Patient has no known allergies.    Social History:  The patient  reports that she has never smoked. She has never used smokeless tobacco. She reports that she does not drink alcohol or use drugs.   Family History:  The patient's family history includes Cerebral palsy in her son; Congestive Heart Failure in her brother and brother; Diabetes in her mother; Heart failure in her mother; Hypertension in her brother and mother.    ROS:  Please see the history of present illness.   Otherwise, review of systems are positive for none.   All other systems are reviewed and negative.    PHYSICAL EXAM: VS:  BP (!) 148/82 (BP Location: Left Arm, Patient Position: Sitting, Cuff Size: Normal)   Pulse 83   Ht 5' (1.524 m)   Wt 162 lb 4 oz (73.6 kg)   BMI 31.69 kg/m  , BMI Body mass index is 31.69 kg/m. GEN: Well nourished, well developed, in no acute distress  HEENT: normal  Neck: no JVD, carotid bruits, or masses Cardiac: RRR; no murmurs, rubs, or gallops,no edema  Respiratory:  clear to auscultation bilaterally, normal work of breathing GI: soft, nontender, nondistended, + BS MS: no deformity or atrophy  Skin:  warm and dry, no rash Neuro:  Strength and sensation are intact Psych: euthymic mood, full affect   EKG:  EKG is ordered today. The ekg ordered today demonstrates normal sinus rhythm with no significant ST or T wave changes.   Recent Labs: 02/27/2016: ALT 21; BUN 12; Creatinine, Ser 0.79; Hemoglobin 14.0; Platelets 385.0; Potassium 3.2; Sodium 139    Lipid Panel    Component Value Date/Time   CHOL 241 (H) 02/27/2016 1505   CHOL 274 (H) 08/15/2015 0803   TRIG 281.0 (H) 02/27/2016 1505   HDL 39.70 02/27/2016 1505   HDL 38 (L) 08/15/2015 0803   CHOLHDL 6 02/27/2016 1505   VLDL 56.2 (H) 02/27/2016 1505   LDLCALC 184 (H) 08/15/2015 0803   LDLDIRECT 163.0 02/27/2016 1505       Wt Readings from Last 3 Encounters:  08/19/16 162 lb 4 oz (73.6 kg)  02/27/16 161 lb (73 kg)  02/15/16 166 lb (75.3 kg)       ASSESSMENT AND PLAN:  1.  Essential hypertension:  Blood pressure is reasonably controlled on current medications. She had an episodes of what sounds like orthostatic hypotension recently. No recurrent episodes. I elected not to increase her antihypertensive medications and I advised her to avoid sudden standing up.  2. Hyperlipidemia: She was started on simvastatin and is going to follow-up with her primary care provider regarding this.    Disposition:   FU with me in 12 months  Signed,  Kathlyn Sacramento, MD  08/19/2016 1:37 PM    [facility]

## 2016-08-19 NOTE — Patient Instructions (Signed)
Medication Instructions: Continue same medications.   Labwork: None.   Procedures/Testing: None.   Follow-Up: 1 year with Dr. Abigail Teall.   Any Additional Special Instructions Will Be Listed Below (If Applicable).     If you need a refill on your cardiac medications before your next appointment, please call your pharmacy.   

## 2016-09-02 ENCOUNTER — Other Ambulatory Visit: Payer: Self-pay | Admitting: Cardiovascular Disease

## 2016-09-09 ENCOUNTER — Other Ambulatory Visit: Payer: Self-pay | Admitting: Internal Medicine

## 2016-09-19 ENCOUNTER — Other Ambulatory Visit: Payer: Self-pay

## 2016-09-19 MED ORDER — AMLODIPINE BESYLATE 5 MG PO TABS: 5.0000 mg | ORAL_TABLET | Freq: Every day | ORAL | 3 refills | Status: DC

## 2016-10-15 ENCOUNTER — Other Ambulatory Visit: Payer: Self-pay | Admitting: Internal Medicine

## 2017-02-14 ENCOUNTER — Other Ambulatory Visit: Payer: Self-pay | Admitting: Cardiovascular Disease

## 2017-02-28 ENCOUNTER — Ambulatory Visit (INDEPENDENT_AMBULATORY_CARE_PROVIDER_SITE_OTHER): Payer: Medicare PPO | Admitting: Internal Medicine

## 2017-02-28 ENCOUNTER — Encounter: Payer: Self-pay | Admitting: Internal Medicine

## 2017-02-28 VITALS — BP 138/86 | HR 84 | Temp 98.4°F | Ht 63.0 in | Wt 169.2 lb

## 2017-02-28 DIAGNOSIS — E785 Hyperlipidemia, unspecified: Secondary | ICD-10-CM | POA: Insufficient documentation

## 2017-02-28 DIAGNOSIS — Z0000 Encounter for general adult medical examination without abnormal findings: Secondary | ICD-10-CM

## 2017-02-28 DIAGNOSIS — E7800 Pure hypercholesterolemia, unspecified: Secondary | ICD-10-CM | POA: Diagnosis not present

## 2017-02-28 DIAGNOSIS — Z9109 Other allergy status, other than to drugs and biological substances: Secondary | ICD-10-CM

## 2017-02-28 DIAGNOSIS — E1169 Type 2 diabetes mellitus with other specified complication: Secondary | ICD-10-CM | POA: Insufficient documentation

## 2017-02-28 DIAGNOSIS — Z0001 Encounter for general adult medical examination with abnormal findings: Secondary | ICD-10-CM

## 2017-02-28 DIAGNOSIS — I10 Essential (primary) hypertension: Secondary | ICD-10-CM | POA: Diagnosis not present

## 2017-02-28 DIAGNOSIS — F320 Major depressive disorder, single episode, mild: Secondary | ICD-10-CM | POA: Diagnosis not present

## 2017-02-28 LAB — COMPREHENSIVE METABOLIC PANEL
ALBUMIN: 4.3 g/dL (ref 3.5–5.2)
ALK PHOS: 76 U/L (ref 39–117)
ALT: 30 U/L (ref 0–35)
AST: 22 U/L (ref 0–37)
BUN: 13 mg/dL (ref 6–23)
CO2: 28 mEq/L (ref 19–32)
CREATININE: 0.77 mg/dL (ref 0.40–1.20)
Calcium: 9.5 mg/dL (ref 8.4–10.5)
Chloride: 102 mEq/L (ref 96–112)
GFR: 77.4 mL/min (ref >60.00)
GLUCOSE: 127 mg/dL — AB (ref 70–99)
POTASSIUM: 3.8 meq/L (ref 3.5–5.1)
SODIUM: 138 meq/L (ref 135–145)
TOTAL PROTEIN: 7.4 g/dL (ref 6.0–8.3)
Total Bilirubin: 0.7 mg/dL (ref 0.2–1.2)

## 2017-02-28 LAB — LIPID PANEL
Cholesterol: 242 mg/dL — ABNORMAL HIGH (ref 0–200)
HDL: 45.8 mg/dL (ref >39.00)
NonHDL: 196.08
Total CHOL/HDL Ratio: 5
Triglycerides: 338 mg/dL — ABNORMAL HIGH (ref 0.0–149.0)
VLDL: 67.6 mg/dL — AB (ref 0.0–40.0)

## 2017-02-28 LAB — CBC
HEMATOCRIT: 43.2 % (ref 36.0–46.0)
Hemoglobin: 14.3 g/dL (ref 12.0–15.0)
MCHC: 33 g/dL (ref 30.0–36.0)
MCV: 88.8 fl (ref 78.0–100.0)
Platelets: 412 10*3/uL — ABNORMAL HIGH (ref 150.0–400.0)
RBC: 4.87 Mil/uL (ref 3.87–5.11)
RDW: 14.1 % (ref 11.5–15.5)
WBC: 10.2 10*3/uL (ref 4.0–10.5)

## 2017-02-28 LAB — LDL CHOLESTEROL, DIRECT: Direct LDL: 148 mg/dL

## 2017-02-28 MED ORDER — SERTRALINE HCL 25 MG PO TABS: 25.0000 mg | ORAL_TABLET | Freq: Every day | ORAL | 11 refills | Status: DC

## 2017-02-28 NOTE — Assessment & Plan Note (Signed)
Controlled on Carvedilol, Hydralazine and Amlodipine Medications refilld today

## 2017-02-28 NOTE — Assessment & Plan Note (Signed)
Secondary to grief Will start Zoloft 25 mg PO QHS

## 2017-02-28 NOTE — Assessment & Plan Note (Signed)
CMET and Lipid profile today Encouraged her to consume a low fat diet Continue Simvastatin, will adjust if needed based on labs 

## 2017-02-28 NOTE — Patient Instructions (Signed)

## 2017-02-28 NOTE — Assessment & Plan Note (Signed)
Continue daily Claritin

## 2017-02-28 NOTE — Progress Notes (Signed)
HPI:  Pt presents to the clinic today for her Medicare Wellness Exam. She is also due to follow up chronic conditions.  Seasonal Allergies: Worse in the spring but symptoms can occur all year long. She takes Claritin daily with good relief.  HTN: Her BP today is 138/86 . She is taking Carvedilol, HCTZ, Potassium and Amlodipine as prescribed. ECG from 08/2016 reviewed.  HLD: Her last LDL was 163, 08/2016. She is taking Simvastatin as prescribed. She denies myalgias. She does not consume a low fat diet.  She reports she is depressed. She recently lost her son, back in April. She has crying spells. She feels down and depressed. She has never been treated for depression in the past. She denies SI/HI.  Past Medical History:  Diagnosis Date  . Allergy   . Chicken pox   . Heart murmur   . Hypertension     Current Outpatient Prescriptions  Medication Sig Dispense Refill  . amLODipine (NORVASC) 5 MG tablet Take 1 tablet (5 mg total) by mouth daily. 180 tablet 3  . carvedilol (COREG) 25 MG tablet TAKE 1 TABLET (25 MG TOTAL) BY MOUTH 2 (TWO) TIMES DAILY. 60 tablet 3  . hydrochlorothiazide (HYDRODIURIL) 12.5 MG tablet TAKE 1 TABLET (12.5 MG TOTAL) BY MOUTH DAILY. 30 tablet 5  . KLOR-CON M20 20 MEQ tablet TAKE 1 TABLET (20 MEQ TOTAL) BY MOUTH 2 (TWO) TIMES DAILY. 60 tablet 4  . simvastatin (ZOCOR) 20 MG tablet Take 1 tablet (20 mg total) by mouth at bedtime. 30 tablet 4   No current facility-administered medications for this visit.     No Known Allergies  Family History  Problem Relation Age of Onset  . Heart failure Mother   . Diabetes Mother   . Hypertension Mother   . Congestive Heart Failure Brother   . Hypertension Brother   . Cerebral palsy Son   . Congestive Heart Failure Brother     Social History   Social History  . Marital status: Widowed    Spouse name: N/A  . Number of children: N/A  . Years of education: N/A   Occupational History  . Not on file.   Social History  Main Topics  . Smoking status: Never Smoker  . Smokeless tobacco: Never Used  . Alcohol use No  . Drug use: No  . Sexual activity: No   Other Topics Concern  . Not on file   Social History Narrative  . No narrative on file    Hospitiliaztions: None  Health Maintenance:    Flu: never  Tetanus: never  Pneumovax: never  Prevnar: never  Zostavax: never  Shingrix: never  Mammogram: never  Pap Smear: never  Bone Density: never  Colon Screening: never  Eye Doctor: as needed  Dental Exam: dentures   Providers:   PCP: Webb Silversmith, NP-C  Cardiologist: Dr. Fletcher Anon    I have personally reviewed and have noted:  1. The patient's medical and social history 2. Their use of alcohol, tobacco or illicit drugs 3. Their current medications and supplements 4. The patient's functional ability including ADL's, fall risks, home safety risks and hearing or visual impairment. 5. Diet and physical activities 6. Evidence for depression or mood disorder  Subjective:   Review of Systems:   Constitutional: Denies fever, malaise, fatigue, headache or abrupt weight changes.  HEENT: Denies eye pain, eye redness, ear pain, ringing in the ears, wax buildup, runny nose, nasal congestion, bloody nose, or sore throat. Respiratory: Denies difficulty breathing,  shortness of breath, cough or sputum production.   Cardiovascular: Denies chest pain, chest tightness, palpitations or swelling in the hands or feet.  Gastrointestinal: Denies abdominal pain, bloating, constipation, diarrhea or blood in the stool.  GU: Denies urgency, frequency, pain with urination, burning sensation, blood in urine, odor or discharge. Musculoskeletal: Denies decrease in range of motion, difficulty with gait, muscle pain or joint pain and swelling.  Skin: Denies redness, rashes, lesions or ulcercations.  Neurological: Denies dizziness, difficulty with memory, difficulty with speech or problems with balance and coordination.   Psych: Pt reports depression. Denies anxiety, SI/HI.  No other specific complaints in a complete review of systems (except as listed in HPI above).  Objective:  PE:   BP 138/86   Pulse 84   Temp 98.4 F (36.9 C) (Oral)   Ht 5\' 3"  (1.6 m)   Wt 169 lb 4 oz (76.8 kg)   SpO2 97%   BMI 29.98 kg/m   Wt Readings from Last 3 Encounters:  08/19/16 162 lb 4 oz (73.6 kg)  02/27/16 161 lb (73 kg)  02/15/16 166 lb (75.3 kg)    General: Appears her stated age, well developed, well nourished in NAD. Skin: Warm, dry and intact.  HEENT: Head: normal shape and size; Eyes: sclera white, no icterus, conjunctiva pink, PERRLA and EOMs intact; Ears: Tm's gray and intact, normal light reflex; Throat/Mouth: Teeth present, mucosa pink and moist, no exudate, lesions or ulcerations noted.  Neck: Neck supple, trachea midline. No masses, lumps or thyromegaly present.  Cardiovascular: Normal rate and rhythm. S1,S2 noted.  No murmur, rubs or gallops noted. No JVD or BLE edema. No carotid bruits noted. Pulmonary/Chest: Normal effort and positive vesicular breath sounds. No respiratory distress. No wheezes, rales or ronchi noted.  Abdomen: Soft and nontender. Normal bowel sounds. No distention or masses noted. Liver, spleen and kidneys non palpable. Musculoskeletal:  Strength 5/5 BUE/BLE. No signs of joint swelling.  Neurological: Alert and oriented. Cranial nerves II-XII grossly intact. Coordination normal.  Psychiatric: She is tearful today.   BMET    Component Value Date/Time   NA 139 02/27/2016 1505   NA 140 08/15/2015 0803   K 3.2 (L) 02/27/2016 1505   CL 100 02/27/2016 1505   CO2 31 02/27/2016 1505   GLUCOSE 104 (H) 02/27/2016 1505   BUN 12 02/27/2016 1505   BUN 11 08/15/2015 0803   CREATININE 0.79 02/27/2016 1505   CALCIUM 9.4 02/27/2016 1505   GFRNONAA 58 (L) 08/15/2015 0803   GFRAA 67 08/15/2015 0803    Lipid Panel     Component Value Date/Time   CHOL 241 (H) 02/27/2016 1505   CHOL  274 (H) 08/15/2015 0803   TRIG 281.0 (H) 02/27/2016 1505   HDL 39.70 02/27/2016 1505   HDL 38 (L) 08/15/2015 0803   CHOLHDL 6 02/27/2016 1505   VLDL 56.2 (H) 02/27/2016 1505   LDLCALC 184 (H) 08/15/2015 0803    CBC    Component Value Date/Time   WBC 11.0 (H) 02/27/2016 1505   RBC 4.86 02/27/2016 1505   HGB 14.0 02/27/2016 1505   HCT 41.8 02/27/2016 1505   PLT 385.0 02/27/2016 1505   MCV 85.9 02/27/2016 1505   MCHC 33.5 02/27/2016 1505   RDW 13.7 02/27/2016 1505    Hgb A1C No results found for: HGBA1C    Assessment and Plan:   Medicare Annual Wellness Visit:  Diet: She does eat meat. She consumes fruits and veggies. She does eat fried foods. She drinks  mostly water. Physical activity: None Depression/mood screen: Positive, will start meds. Hearing: Intact to whispered voice Visual acuity: Grossly norma ADLs: Capable Fall risk: None Home safety: Good Cognitive evaluation: Intact to orientation, naming, recall and repetition EOL planning: Living will, full code/ I agree  Preventative Medicine: She declines flu, tetanus, pneumovax, prevnar, zostovax, shingrix, mammogram, pap smear, colonoscopy or cologuard, bone density. Encouraged her to consume a balanced diet and exercise regimen. Advised her to see an eye doctor annually. Will check CBC, CMET, Lipid profile today.   Next appointment: 6 weeks follow up depression   Webb Silversmith, NP

## 2017-04-22 ENCOUNTER — Other Ambulatory Visit: Payer: Self-pay | Admitting: Internal Medicine

## 2017-05-19 ENCOUNTER — Other Ambulatory Visit: Payer: Self-pay | Admitting: *Deleted

## 2017-05-19 MED ORDER — HYDROCHLOROTHIAZIDE 12.5 MG PO TABS: 12.5000 mg | ORAL_TABLET | Freq: Every day | ORAL | 5 refills | Status: DC

## 2017-07-06 ENCOUNTER — Other Ambulatory Visit: Payer: Self-pay | Admitting: Cardiovascular Disease

## 2017-07-18 ENCOUNTER — Encounter: Payer: Self-pay | Admitting: Emergency Medicine

## 2017-07-18 ENCOUNTER — Other Ambulatory Visit: Payer: Self-pay

## 2017-07-18 ENCOUNTER — Observation Stay
Admission: EM | Admit: 2017-07-18 | Discharge: 2017-07-19 | Disposition: A | Discharge status: Home / Self Care | Payer: Medicare PPO | Attending: Internal Medicine | Admitting: Internal Medicine

## 2017-07-18 ENCOUNTER — Observation Stay: Payer: Medicare PPO

## 2017-07-18 ENCOUNTER — Emergency Department: Payer: Medicare PPO

## 2017-07-18 DIAGNOSIS — E785 Hyperlipidemia, unspecified: Secondary | ICD-10-CM | POA: Insufficient documentation

## 2017-07-18 DIAGNOSIS — Z79899 Other long term (current) drug therapy: Secondary | ICD-10-CM | POA: Diagnosis not present

## 2017-07-18 DIAGNOSIS — R740 Nonspecific elevation of levels of transaminase and lactic acid dehydrogenase [LDH]: Secondary | ICD-10-CM | POA: Insufficient documentation

## 2017-07-18 DIAGNOSIS — R109 Unspecified abdominal pain: Secondary | ICD-10-CM | POA: Diagnosis not present

## 2017-07-18 DIAGNOSIS — M549 Dorsalgia, unspecified: Secondary | ICD-10-CM | POA: Diagnosis present

## 2017-07-18 DIAGNOSIS — F329 Major depressive disorder, single episode, unspecified: Secondary | ICD-10-CM | POA: Diagnosis not present

## 2017-07-18 DIAGNOSIS — D72829 Elevated white blood cell count, unspecified: Secondary | ICD-10-CM | POA: Diagnosis not present

## 2017-07-18 DIAGNOSIS — I10 Essential (primary) hypertension: Secondary | ICD-10-CM | POA: Insufficient documentation

## 2017-07-18 DIAGNOSIS — R7989 Other specified abnormal findings of blood chemistry: Secondary | ICD-10-CM

## 2017-07-18 DIAGNOSIS — R52 Pain, unspecified: Secondary | ICD-10-CM

## 2017-07-18 DIAGNOSIS — L03319 Cellulitis of trunk, unspecified: Secondary | ICD-10-CM

## 2017-07-18 DIAGNOSIS — E872 Acidosis: Secondary | ICD-10-CM | POA: Diagnosis not present

## 2017-07-18 LAB — URINALYSIS, COMPLETE (UACMP) WITH MICROSCOPIC
Bacteria, UA: NONE SEEN
Bilirubin Urine: NEGATIVE
Glucose, UA: NEGATIVE mg/dL
HGB URINE DIPSTICK: NEGATIVE
Ketones, ur: NEGATIVE mg/dL
Leukocytes, UA: NEGATIVE
Nitrite: NEGATIVE
PROTEIN: NEGATIVE mg/dL
SPECIFIC GRAVITY, URINE: 1.012 (ref 1.005–1.030)
pH: 6 (ref 5.0–8.0)

## 2017-07-18 LAB — CBC
HEMATOCRIT: 43 % (ref 35.0–47.0)
HEMOGLOBIN: 14.2 g/dL (ref 12.0–16.0)
MCH: 28.5 pg (ref 26.0–34.0)
MCHC: 32.9 g/dL (ref 32.0–36.0)
MCV: 86.6 fL (ref 80.0–100.0)
Platelets: 414 10*3/uL (ref 150–440)
RBC: 4.97 MIL/uL (ref 3.80–5.20)
RDW: 13.6 % (ref 11.5–14.5)
WBC: 17.5 10*3/uL — AB (ref 3.6–11.0)

## 2017-07-18 LAB — COMPREHENSIVE METABOLIC PANEL
ALT: 23 U/L (ref 14–54)
ANION GAP: 13 (ref 5–15)
AST: 29 U/L (ref 15–41)
Albumin: 4 g/dL (ref 3.5–5.0)
Alkaline Phosphatase: 126 U/L (ref 38–126)
BUN: 11 mg/dL (ref 6–20)
CALCIUM: 9.3 mg/dL (ref 8.9–10.3)
CHLORIDE: 103 mmol/L (ref 101–111)
CO2: 21 mmol/L — AB (ref 22–32)
CREATININE: 0.73 mg/dL (ref 0.44–1.00)
Glucose, Bld: 160 mg/dL — ABNORMAL HIGH (ref 65–99)
Potassium: 3.7 mmol/L (ref 3.5–5.1)
SODIUM: 137 mmol/L (ref 135–145)
Total Bilirubin: 0.7 mg/dL (ref 0.3–1.2)
Total Protein: 7.8 g/dL (ref 6.5–8.1)

## 2017-07-18 LAB — LACTIC ACID, PLASMA
LACTIC ACID, VENOUS: 2.2 mmol/L — AB (ref 0.5–1.9)
LACTIC ACID, VENOUS: 2.6 mmol/L — AB (ref 0.5–1.9)

## 2017-07-18 MED ORDER — CARVEDILOL 25 MG PO TABS
25.0000 mg | ORAL_TABLET | Freq: Two times a day (BID) | ORAL | Status: DC
  Administered 2017-07-19: 25 mg via ORAL
  Filled 2017-07-18: qty 1

## 2017-07-18 MED ORDER — POTASSIUM CHLORIDE IN NACL 20-0.9 MEQ/L-% IV SOLN
INTRAVENOUS | Status: DC
  Administered 2017-07-19: 03:00:00 via INTRAVENOUS
  Filled 2017-07-18: qty 1000

## 2017-07-18 MED ORDER — HYDRALAZINE HCL 20 MG/ML IJ SOLN: 10.0000 mg | Freq: Four times a day (QID) | INTRAMUSCULAR | Status: DC | PRN

## 2017-07-18 MED ORDER — POTASSIUM CHLORIDE CRYS ER 20 MEQ PO TBCR
20.0000 meq | EXTENDED_RELEASE_TABLET | Freq: Two times a day (BID) | ORAL | Status: DC
  Administered 2017-07-18 – 2017-07-19 (×2): 20 meq via ORAL
  Filled 2017-07-18 (×2): qty 1

## 2017-07-18 MED ORDER — SODIUM CHLORIDE 0.9 % IV BOLUS (SEPSIS)
1000.0000 mL | Freq: Once | INTRAVENOUS | Status: AC
  Administered 2017-07-18: 1000 mL via INTRAVENOUS

## 2017-07-18 MED ORDER — ACETAMINOPHEN 650 MG RE SUPP: 650.0000 mg | Freq: Four times a day (QID) | RECTAL | Status: DC | PRN

## 2017-07-18 MED ORDER — IBUPROFEN 400 MG PO TABS
400.0000 mg | ORAL_TABLET | Freq: Four times a day (QID) | ORAL | Status: DC | PRN
  Administered 2017-07-19 (×2): 400 mg via ORAL
  Filled 2017-07-18 (×2): qty 1

## 2017-07-18 MED ORDER — VANCOMYCIN HCL IN DEXTROSE 1-5 GM/200ML-% IV SOLN
1000.0000 mg | Freq: Once | INTRAVENOUS | Status: DC
  Administered 2017-07-18: 1000 mg via INTRAVENOUS
  Filled 2017-07-18: qty 200

## 2017-07-18 MED ORDER — ENOXAPARIN SODIUM 40 MG/0.4ML ~~LOC~~ SOLN
40.0000 mg | SUBCUTANEOUS | Status: DC
  Administered 2017-07-18: 40 mg via SUBCUTANEOUS
  Filled 2017-07-18: qty 0.4

## 2017-07-18 MED ORDER — HYDROCHLOROTHIAZIDE 25 MG PO TABS
12.5000 mg | ORAL_TABLET | Freq: Every day | ORAL | Status: DC
  Administered 2017-07-19: 12.5 mg via ORAL
  Filled 2017-07-18: qty 1

## 2017-07-18 MED ORDER — ONDANSETRON HCL 4 MG/2ML IJ SOLN: 4.0000 mg | Freq: Four times a day (QID) | INTRAMUSCULAR | Status: DC | PRN

## 2017-07-18 MED ORDER — ONDANSETRON HCL 4 MG PO TABS: 4.0000 mg | ORAL_TABLET | Freq: Four times a day (QID) | ORAL | Status: DC | PRN

## 2017-07-18 MED ORDER — AMLODIPINE BESYLATE 5 MG PO TABS
5.0000 mg | ORAL_TABLET | Freq: Every day | ORAL | Status: DC
  Administered 2017-07-19: 5 mg via ORAL
  Filled 2017-07-18: qty 1

## 2017-07-18 MED ORDER — POLYETHYLENE GLYCOL 3350 17 G PO PACK: 17.0000 g | PACK | Freq: Every day | ORAL | Status: DC | PRN

## 2017-07-18 MED ORDER — IOPAMIDOL (ISOVUE-300) INJECTION 61%
30.0000 mL | Freq: Once | INTRAVENOUS | Status: AC | PRN
  Administered 2017-07-18: 30 mL via ORAL

## 2017-07-18 MED ORDER — SIMVASTATIN 20 MG PO TABS
20.0000 mg | ORAL_TABLET | Freq: Every day | ORAL | Status: DC
  Administered 2017-07-19: 20 mg via ORAL
  Filled 2017-07-18 (×2): qty 1

## 2017-07-18 MED ORDER — SODIUM CHLORIDE 0.9 % IV BOLUS (SEPSIS)
500.0000 mL | Freq: Once | INTRAVENOUS | Status: AC
  Administered 2017-07-18: 500 mL via INTRAVENOUS

## 2017-07-18 MED ORDER — SERTRALINE HCL 50 MG PO TABS
25.0000 mg | ORAL_TABLET | Freq: Every day | ORAL | Status: DC
  Filled 2017-07-18: qty 1

## 2017-07-18 MED ORDER — IOPAMIDOL (ISOVUE-300) INJECTION 61%
100.0000 mL | Freq: Once | INTRAVENOUS | Status: AC | PRN
  Administered 2017-07-18: 100 mL via INTRAVENOUS

## 2017-07-18 MED ORDER — CARVEDILOL 25 MG PO TABS
25.0000 mg | ORAL_TABLET | Freq: Once | ORAL | Status: AC
  Administered 2017-07-18: 25 mg via ORAL
  Filled 2017-07-18: qty 1

## 2017-07-18 MED ORDER — ACETAMINOPHEN 325 MG PO TABS
650.0000 mg | ORAL_TABLET | Freq: Four times a day (QID) | ORAL | Status: DC | PRN
  Administered 2017-07-18: 650 mg via ORAL
  Filled 2017-07-18: qty 2

## 2017-07-18 NOTE — ED Notes (Signed)
Date and time results received: 07/18/17 5:24 PM (use smartphrase ".now" to insert current time)  Test: Lactic Acid Critical Value: 2.2  Name of Provider Notified: Dr. Archie Balboa  Orders Received? Or Actions Taken?: Orders Received - See Orders for details

## 2017-07-18 NOTE — ED Notes (Signed)
Patient transported to X-ray 

## 2017-07-18 NOTE — H&P (Signed)
[facility] NAME: Exie Chrismer    MR#:  [id number]  DATE OF BIRTH:  Oct 18, 1940  DATE OF ADMISSION:  07/18/2017  PRIMARY CARE PHYSICIAN: Jearld Fenton, NP   REQUESTING/REFERRING PHYSICIAN: Dr. Archie Balboa  CHIEF COMPLAINT:   Chief Complaint  Patient presents with  . Back Pain    HISTORY OF PRESENT ILLNESS:  Corine Solorio  is a 76 y.o. female with a known history of hypertension presents to the emergency room complaining of right flank pain since today morning.  The pain tends to radiate into her right buttock area.  No nausea, vomiting, diarrhea, dysuria.  Afebrile.  Here in the emergency room she has been found to have leukocytosis and elevated lactic acid.  Mild tachycardia.  Uncontrolled hypertension.  Patient is being admitted for possible sepsis although no source is identified.  PAST MEDICAL HISTORY:   Past Medical History:  Diagnosis Date  . Allergy   . Chicken pox   . Heart murmur   . Hypertension     PAST SURGICAL HISTORY:  History reviewed. No pertinent surgical history.  SOCIAL HISTORY:   Social History   Tobacco Use  . Smoking status: Never Smoker  . Smokeless tobacco: Never Used  Substance Use Topics  . Alcohol use: No    Alcohol/week: 0.0 oz    FAMILY HISTORY:   Family History  Problem Relation Age of Onset  . Heart failure Mother   . Diabetes Mother   . Hypertension Mother   . Congestive Heart Failure Brother   . Hypertension Brother   . Cerebral palsy Son   . Congestive Heart Failure Brother     DRUG ALLERGIES:  No Known Allergies  REVIEW OF SYSTEMS:   Review of Systems  Constitutional: Positive for malaise/fatigue. Negative for chills and fever.  HENT: Negative for sore throat.   Eyes: Negative for blurred vision, double vision and pain.  Respiratory: Negative for cough, hemoptysis, shortness of breath and wheezing.   Cardiovascular: Negative for chest pain, palpitations, orthopnea and  leg swelling.  Gastrointestinal: Negative for abdominal pain, constipation, diarrhea, heartburn, nausea and vomiting.  Genitourinary: Positive for flank pain. Negative for dysuria and hematuria.  Musculoskeletal: Negative for back pain and joint pain.  Skin: Negative for rash.  Neurological: Positive for weakness. Negative for sensory change, speech change, focal weakness and headaches.  Endo/Heme/Allergies: Does not bruise/bleed easily.  Psychiatric/Behavioral: Negative for depression. The patient is not nervous/anxious.     MEDICATIONS AT HOME:   Prior to Admission medications   Medication Sig Start Date End Date Taking? Authorizing Provider  amLODipine (NORVASC) 5 MG tablet Take 1 tablet (5 mg total) by mouth daily. 09/19/16  Yes Wellington Hampshire, MD  carvedilol (COREG) 25 MG tablet TAKE 1 TABLET BY MOUTH TWICE A DAY 07/07/17  Yes Wellington Hampshire, MD  hydrochlorothiazide (HYDRODIURIL) 12.5 MG tablet Take 1 tablet (12.5 mg total) by mouth daily. 05/19/17  Yes Arida, Mertie Clause, MD  KLOR-CON M20 20 MEQ tablet TAKE 1 TABLET (20 MEQ TOTAL) BY MOUTH 2 (TWO) TIMES DAILY. 04/22/17  Yes Jearld Fenton, NP  sertraline (ZOLOFT) 25 MG tablet Take 1 tablet (25 mg total) by mouth at bedtime. 02/28/17  Yes Jearld Fenton, NP  simvastatin (ZOCOR) 20 MG tablet Take 1 tablet (20 mg total) by mouth at bedtime. 09/09/16  Yes Baity, Coralie Keens, NP     VITAL SIGNS:  Blood pressure (!) 182/82, pulse 98, temperature 98  F (36.7 C), temperature source Oral, resp. rate 16, height 5\' 7"  (1.702 m), weight 74.8 kg (165 lb), SpO2 96 %.  PHYSICAL EXAMINATION:  Physical Exam  GENERAL:  76 y.o.-year-old patient lying in the bed with no acute distress.  EYES: Pupils equal, round, reactive to light and accommodation. No scleral icterus. Extraocular muscles intact.  HEENT: Head atraumatic, normocephalic. Oropharynx and nasopharynx clear. No oropharyngeal erythema, moist oral mucosa  NECK:  Supple, no jugular venous  distention. No thyroid enlargement, no tenderness.  LUNGS: Normal breath sounds bilaterally, no wheezing, rales, rhonchi. No use of accessory muscles of respiration.  CARDIOVASCULAR: S1, S2 normal. No murmurs, rubs, or gallops.  ABDOMEN: Soft, nontender, nondistended. Bowel sounds present. No organomegaly or mass.  EXTREMITIES: No pedal edema, cyanosis, or clubbing. + 2 pedal & radial pulses b/l.   NEUROLOGIC: Cranial nerves II through XII are intact. No focal Motor or sensory deficits appreciated b/l PSYCHIATRIC: The patient is alert and oriented x 3. Good affect.  SKIN: Mild redness over lower back  LABORATORY PANEL:   CBC Recent Labs  Lab 07/18/17 1548  WBC 17.5*  HGB 14.2  HCT 43.0  PLT 414   ------------------------------------------------------------------------------------------------------------------  Chemistries  Recent Labs  Lab 07/18/17 1548  NA 137  K 3.7  CL 103  CO2 21*  GLUCOSE 160*  BUN 11  CREATININE 0.73  CALCIUM 9.3  AST 29  ALT 23  ALKPHOS 126  BILITOT 0.7   ------------------------------------------------------------------------------------------------------------------  Cardiac Enzymes No results for input(s): TROPONINI in the last 168 hours. ------------------------------------------------------------------------------------------------------------------  RADIOLOGY:  Dg Hip Unilat With Pelvis 2-3 Views Right  Result Date: 07/18/2017 CLINICAL DATA:  Low back and RIGHT hip pain since yesterday. No injury. EXAM: DG HIP (WITH OR WITHOUT PELVIS) 2-3V RIGHT COMPARISON:  None. FINDINGS: Femoral heads are well formed and located. Hip joint spaces are intact. No advanced arthropathy. Mild degenerative change of the sacroiliac joints . Sacroiliac joints are symmetric. No destructive bony lesions. Included soft tissue planes are non-suspicious. Mild vascular calcifications. IMPRESSION: Negative. Electronically Signed   By: Elon Alas M.D.   On:  07/18/2017 16:23     IMPRESSION AND PLAN:   *Right flank pain.  Etiology unclear at this time.  She does have elevated leukocytosis and lactic acid. UA is clear.  Will check CT scan of the abdomen and pelvis with contrast. IV fluids.  No antibiotics.  No signs of infection.  *Hypertension, uncontrolled.  Will give her evening dose of medications.  Add IV hydralazine.  * Lower back redness due to hot pack likely  *DVT prophylaxis with Lovenox.  All the records are reviewed and case discussed with ED provider. Management plans discussed with the patient, family and they are in agreement.  CODE STATUS: FULL CODE  TOTAL TIME TAKING CARE OF THIS PATIENT: 40 minutes.   Leia Alf Lilla Callejo M.D on 07/18/2017 at 6:08 PM  Between 7am to 6pm - Pager - [phone]  After 6pm go to [email or web address] - password [email or web address] [facility]  Office  [phone]  CC: Primary care physician; Jearld Fenton, NP  Note: This dictation was prepared with Dragon dictation along with smaller phrase technology. Any transcriptional errors that result from this process are unintentional.

## 2017-07-18 NOTE — Progress Notes (Signed)
Dr Juanna Cao paged regarding lactic acid 2.6. Orders given

## 2017-07-18 NOTE — ED Triage Notes (Signed)
Pt to ED via EMS from home c/o upper right back pain radiating down right side and into right leg.  States feels like pulled muscle.  Pt is A&Ox4, denies chest pain or SOB.

## 2017-07-18 NOTE — ED Provider Notes (Signed)
[facility] Emergency Department Provider Note    ____________________________________________   I have reviewed the triage vital signs and the nursing notes.   HISTORY  Chief Complaint Back Pain   History limited by: Not Limited   HPI Janice Liu is a 76 y.o. female who presents to the emergency department today because of concern for back and hip pain.   LOCATION:right sided DURATION:roughly 12 hours TIMING: constant SEVERITY: severe QUALITY: ache CONTEXT: patient denies any recent trauma or falls. States the pain started in her right back last night. Started radiating down through her right hip and into her leg.  MODIFYING FACTORS: worse with movement ASSOCIATED SYMPTOMS: denies any change in urination or defecation. Denies any fevers.   Per medical record review patient has a history of htn.  Past Medical History:  Diagnosis Date  . Allergy   . Chicken pox   . Heart murmur   . Hypertension     Patient Active Problem List   Diagnosis Date Noted  . HLD (hyperlipidemia) 02/28/2017  . 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 Environmental allergies 08/29/2015  . Essential hypertension 07/15/2014    History reviewed. No pertinent surgical history.  Prior to Admission medications   Medication Sig Start Date End Date Taking? Authorizing Provider  amLODipine (NORVASC) 5 MG tablet Take 1 tablet (5 mg total) by mouth daily. 09/19/16   Wellington Hampshire, MD  carvedilol (COREG) 25 MG tablet TAKE 1 TABLET BY MOUTH TWICE A DAY 07/07/17   Wellington Hampshire, MD  hydrochlorothiazide (HYDRODIURIL) 12.5 MG tablet Take 1 tablet (12.5 mg total) by mouth daily. 05/19/17   Wellington Hampshire, MD  KLOR-CON M20 20 MEQ tablet TAKE 1 TABLET (20 MEQ TOTAL) BY MOUTH 2 (TWO) TIMES DAILY. 04/22/17   Jearld Fenton, NP  sertraline (ZOLOFT) 25 MG tablet Take 1 tablet (25 mg total) by mouth at bedtime. 02/28/17   Jearld Fenton, NP  simvastatin  (ZOCOR) 20 MG tablet Take 1 tablet (20 mg total) by mouth at bedtime. 09/09/16   Jearld Fenton, NP    Allergies Patient has no known allergies.  Family History  Problem Relation Age of Onset  . Heart failure Mother   . Diabetes Mother   . Hypertension Mother   . Congestive Heart Failure Brother   . Hypertension Brother   . Cerebral palsy Son   . Congestive Heart Failure Brother     Social History Social History   Tobacco Use  . Smoking status: Never Smoker  . Smokeless tobacco: Never Used  Substance Use Topics  . Alcohol use: No    Alcohol/week: 0.0 oz  . Drug use: No    Review of Systems Constitutional: No fever/chills Eyes: No visual changes. ENT: No sore throat. Cardiovascular: Denies chest pain. Respiratory: Denies shortness of breath. Gastrointestinal: No abdominal pain.  No nausea, no vomiting.  No diarrhea.   Genitourinary: Negative for dysuria. Musculoskeletal: Back pain, right hip pain. Skin: Negative for rash. Neurological: Negative for headaches, focal weakness or numbness.  ____________________________________________   PHYSICAL EXAM:  VITAL SIGNS: ED Triage Vitals  Enc Vitals Group     BP 07/18/17 1500 (!) 190/90     Pulse Rate 07/18/17 1501 (!) 104     Resp 07/18/17 1504 16     Temp 07/18/17 1504 98 F (36.7 C)     Temp Source 07/18/17 1504 Oral     SpO2 07/18/17 1501 95 %  Weight 07/18/17 1505 165 lb (74.8 kg)     Height 07/18/17 1505 5\' 7"  (1.702 m)   Constitutional: Alert and oriented. Well appearing and in no distress. Eyes: Conjunctivae are normal.  ENT   Head: Normocephalic and atraumatic.   Nose: No congestion/rhinnorhea.   Mouth/Throat: Mucous membranes are moist.   Neck: No stridor. Hematological/Lymphatic/Immunilogical: No cervical lymphadenopathy. Cardiovascular: Normal rate, regular rhythm.  No murmurs, rubs, or gallops.  Respiratory: Normal respiratory effort without tachypnea nor retractions. Breath sounds  are clear and equal bilaterally. No wheezes/rales/rhonchi. Gastrointestinal: Soft and non tender. No rebound. No guarding.  Genitourinary: Deferred Musculoskeletal: Normal range of motion in all extremities. No lower extremity edema. Neurologic:  Normal speech and language. No gross focal neurologic deficits are appreciated.  Skin:  Slight erythema noted to back. Psychiatric: Mood and affect are normal. Speech and behavior are normal. Patient exhibits appropriate insight and judgment.  ____________________________________________    LABS (pertinent positives/negatives)  Lactic 2.2 CMP wnl except co2 21, glu 160 CBC wbc 17.5, hgb 14.2 UA not consistent with infection  ____________________________________________   EKG  None  ____________________________________________    RADIOLOGY  Right hip negative  ____________________________________________   PROCEDURES  Procedures  ____________________________________________   INITIAL IMPRESSION / ASSESSMENT AND PLAN / ED COURSE  Pertinent labs & imaging results that were available during my care of the patient were reviewed by me and considered in my medical decision making (see chart for details).  Patient presented to the emergency department today with concern for right sided back pain, hip pain. Worse with movement. Differential would be proud including MSK causes of the pain, shingles, cellulitis, intraabdominal infection, pneumonia, septic joint, hip fracture amongst other etiologies. Exam is concerning for possible cellulitis and blood work does have an elevated WBC and lactic acidosis. Will plan on starting medication. Will plan on admission to the hospitalist service. Discussed findings and plan with patient and family.  ____________________________________________   FINAL CLINICAL IMPRESSION(S) / ED DIAGNOSES  Final diagnoses:  Cellulitis of trunk, unspecified site of trunk  Leukocytosis, unspecified type   Elevated lactic acid level     Note: This dictation was prepared with Dragon dictation. Any transcriptional errors that result from this process are unintentional     Nance Pear, MD 07/18/17 [phone]

## 2017-07-19 LAB — BASIC METABOLIC PANEL
ANION GAP: 9 (ref 5–15)
BUN: 7 mg/dL (ref 6–20)
CALCIUM: 8.3 mg/dL — AB (ref 8.9–10.3)
CHLORIDE: 107 mmol/L (ref 101–111)
CO2: 25 mmol/L (ref 22–32)
CREATININE: 0.63 mg/dL (ref 0.44–1.00)
GFR calc non Af Amer: >60 mL/min (ref >60)
Glucose, Bld: 129 mg/dL — ABNORMAL HIGH (ref 65–99)
Potassium: 3.8 mmol/L (ref 3.5–5.1)
SODIUM: 141 mmol/L (ref 135–145)

## 2017-07-19 LAB — CBC
HCT: 39.9 % (ref 35.0–47.0)
HEMOGLOBIN: 13.4 g/dL (ref 12.0–16.0)
MCH: 29.1 pg (ref 26.0–34.0)
MCHC: 33.5 g/dL (ref 32.0–36.0)
MCV: 86.9 fL (ref 80.0–100.0)
PLATELETS: 377 10*3/uL (ref 150–440)
RBC: 4.59 MIL/uL (ref 3.80–5.20)
RDW: 13.5 % (ref 11.5–14.5)
WBC: 12.5 10*3/uL — AB (ref 3.6–11.0)

## 2017-07-23 ENCOUNTER — Telehealth: Payer: Self-pay | Admitting: *Deleted

## 2017-07-23 NOTE — Telephone Encounter (Signed)
Transition Care Management Follow-up Telephone Call   Date discharged? 07/19/2017    How have you been since you were released from the hospital? "ok but in a little pain. The ibuprofen isn't working."   Do you understand why you were in the hospital? yes   Do you understand the discharge instructions? yes   Where were you discharged to? home   Items Reviewed:  Medications reviewed: yes  Allergies reviewed: yes  Dietary changes reviewed: n/a  Referrals reviewed: n/a   Functional Questionnaire:   Activities of Daily Living (ADLs):   She states they are independent in the following: independent in all areas     Any transportation issues/concerns?: no   Any patient concerns? no   Confirmed importance and date/time of follow-up visits scheduled yes  Provider Appointment booked [facility] 12/17  Confirmed with patient if condition begins to worsen call PCP or go to the ER.  Patient was given the office number and encouraged to call back with question or concerns.  : yes

## 2017-07-28 ENCOUNTER — Ambulatory Visit: Payer: Medicare PPO | Admitting: Internal Medicine

## 2017-07-28 NOTE — Discharge Summary (Signed)
[facility] [place] NAME: Janice Liu    MR#:  [id number]  DATE OF BIRTH:  [birth year]  DATE OF ADMISSION:  07/18/2017 ADMITTING PHYSICIAN: Hillary Bow, MD  DATE OF DISCHARGE: 07/19/2017 12:03 PM  PRIMARY CARE PHYSICIAN: Jearld Fenton, NP   ADMISSION DIAGNOSIS:  Pain [R52] Elevated lactic acid level [R79.89] Leukocytosis, unspecified type [D72.829] Cellulitis of trunk, unspecified site of trunk [L03.319]  DISCHARGE DIAGNOSIS:  Active Problems:   Right flank pain   SECONDARY DIAGNOSIS:   Past Medical History:  Diagnosis Date  . Allergy   . Chicken pox   . Heart murmur   . Hypertension      ADMITTING HISTORY  HISTORY OF PRESENT ILLNESS:  Janice Liu  is a 76 y.o. female with a known history of hypertension presents to the emergency room complaining of right flank pain since today morning.  The pain tends to radiate into her right buttock area.  No nausea, vomiting, diarrhea, dysuria.  Afebrile.  Here in the emergency room she has been found to have leukocytosis and elevated lactic acid.  Mild tachycardia.  Uncontrolled hypertension.  Patient is being admitted for possible sepsis although no source is identified.     HOSPITAL COURSE:   *Right flank pain likely muscular pain.  CT scan of the abdomen and pelvis showed nothing acute.  She had mild lactic acidosis along with leukocytosis.  Improved with IV fluids.  Cultures remain negative and she was afebrile.  Did not need IV antibiotics.  Hypertension stable.  Patient was discharged home in stable condition to follow-up with the primary care physician.  CONSULTS OBTAINED:    DRUG ALLERGIES:  No Known Allergies  DISCHARGE MEDICATIONS:   Allergies as of 07/19/2017   No Known Allergies     Medication List    STOP taking these medications   sertraline 25 MG tablet Commonly known as:  ZOLOFT     TAKE these medications   amLODipine 5 MG tablet Commonly known  as:  NORVASC Take 1 tablet (5 mg total) by mouth daily.   carvedilol 25 MG tablet Commonly known as:  COREG TAKE 1 TABLET BY MOUTH TWICE A DAY   hydrochlorothiazide 12.5 MG tablet Commonly known as:  HYDRODIURIL Take 1 tablet (12.5 mg total) by mouth daily.   KLOR-CON M20 20 MEQ tablet Generic drug:  potassium chloride SA TAKE 1 TABLET (20 MEQ TOTAL) BY MOUTH 2 (TWO) TIMES DAILY.   simvastatin 20 MG tablet Commonly known as:  ZOCOR Take 1 tablet (20 mg total) by mouth at bedtime.       Today   VITAL SIGNS:  Blood pressure (!) 148/64, pulse 87, temperature 98.3 F (36.8 C), temperature source Oral, resp. rate 18, height 5\' 7"  (1.702 m), weight 74.7 kg (164 lb 11.2 oz), SpO2 97 %.  I/O:  No intake or output data in the 24 hours ending 07/28/17 1620  PHYSICAL EXAMINATION:  Physical Exam  GENERAL:  76 y.o.-year-old patient lying in the bed with no acute distress.  LUNGS: Normal breath sounds bilaterally, no wheezing, rales,rhonchi or crepitation. No use of accessory muscles of respiration.  CARDIOVASCULAR: S1, S2 normal. No murmurs, rubs, or gallops.  ABDOMEN: Soft, non-tender, non-distended. Bowel sounds present. No organomegaly or mass.  NEUROLOGIC: Moves all 4 extremities. PSYCHIATRIC: The patient is alert and oriented x 3.  SKIN: No obvious rash, lesion, or ulcer.   DATA REVIEW:   CBC No results for input(s): WBC, HGB,  HCT, PLT in the last 168 hours.  Chemistries  No results for input(s): NA, K, CL, CO2, GLUCOSE, BUN, CREATININE, CALCIUM, MG, AST, ALT, ALKPHOS, BILITOT in the last 168 hours.  Invalid input(s): GFRCGP  Cardiac Enzymes No results for input(s): TROPONINI in the last 168 hours.  Microbiology Results  No results found for this or any previous visit.  RADIOLOGY:  No results found.  Follow up with PCP in 1 week.  Management plans discussed with the patient, family and they are in agreement.  CODE STATUS:  Code Status History    Date Active  Date Inactive Code Status Order ID Comments User Context   07/18/2017 18:06 07/19/2017 15:03 Full Code [id number]  Hillary Bow, MD ED    Advance Directive Documentation     Most Recent Value  Type of Advance Directive  Healthcare Power of Attorney, Living will  Pre-existing out of facility DNR order (yellow form or pink MOST form)  No data  "MOST" Form in Place?  No data      TOTAL TIME TAKING CARE OF THIS PATIENT ON DAY OF DISCHARGE: more than 30 minutes.   [name] M.D on 07/28/2017 at 4:20 PM  Between 7am to 6pm - Pager - [phone]  After 6pm go to [email or web address] - password [email or web address] [facility]  Office  [phone]  CC: Primary care physician; Jearld Fenton, NP  Note: This dictation was prepared with Dragon dictation along with smaller phrase technology. Any transcriptional errors that result from this process are unintentional.

## 2017-07-31 ENCOUNTER — Encounter: Payer: Self-pay | Admitting: Internal Medicine

## 2017-07-31 ENCOUNTER — Ambulatory Visit: Payer: Medicare PPO | Admitting: Internal Medicine

## 2017-07-31 VITALS — BP 140/84 | HR 85 | Temp 98.0°F | Wt 161.0 lb

## 2017-07-31 DIAGNOSIS — M546 Pain in thoracic spine: Secondary | ICD-10-CM

## 2017-07-31 DIAGNOSIS — Z23 Encounter for immunization: Secondary | ICD-10-CM | POA: Diagnosis not present

## 2017-07-31 MED ORDER — METHOCARBAMOL 500 MG PO TABS
500.0000 mg | ORAL_TABLET | Freq: Three times a day (TID) | ORAL | 0 refills | Status: DC | PRN
Start: 2017-07-31 — End: 2017-08-07

## 2017-07-31 NOTE — Patient Instructions (Signed)

## 2017-08-01 ENCOUNTER — Encounter: Payer: Self-pay | Admitting: Internal Medicine

## 2017-08-01 NOTE — Progress Notes (Signed)
Subjective:    Patient ID: Janice Liu, female    DOB: [birth year], 76 y.o.   MRN: [id number]  HPI  Pt presents to the clinic today for [facility] Follow Up. She went to the ER via EMS 12/7 for severe back pain. In the ER, they noted her WBC and lactic acid were elevated.They admitted her for sepsis workup. CT abdomen and pelvis was normal. Blood cultures were negative. She was treated with 1 dose of IV antibiotics and IV fluids. She was discharged and advised to take Tylenol and Ibuprofen as needed. She reports persistent back pain, but reports it has improved. She denies any injury to the area, but reports she has been moving boxes by herself. She has tried a heating pad as well. She denies radiation into her legs. She denies numbness, tingling or weakness in her legs. She denies loss of bowel or bladder.   Review of Systems  Past Medical History:  Diagnosis Date  . Allergy   . Chicken pox   . Heart murmur   . Hypertension     Current Outpatient Medications  Medication Sig Dispense Refill  . amLODipine (NORVASC) 5 MG tablet Take 1 tablet (5 mg total) by mouth daily. 180 tablet 3  . carvedilol (COREG) 25 MG tablet TAKE 1 TABLET BY MOUTH TWICE A DAY 60 tablet 3  . hydrochlorothiazide (HYDRODIURIL) 12.5 MG tablet Take 1 tablet (12.5 mg total) by mouth daily. 30 tablet 5  . KLOR-CON M20 20 MEQ tablet TAKE 1 TABLET (20 MEQ TOTAL) BY MOUTH 2 (TWO) TIMES DAILY. 240 tablet 4  . simvastatin (ZOCOR) 20 MG tablet Take 1 tablet (20 mg total) by mouth at bedtime. 30 tablet 4  . methocarbamol (ROBAXIN) 500 MG tablet Take 1 tablet (500 mg total) by mouth every 8 (eight) hours as needed for muscle spasms. 15 tablet 0   No current facility-administered medications for this visit.     No Known Allergies  Family History  Problem Relation Age of Onset  . Heart failure Mother   . Diabetes Mother   . Hypertension Mother   . Congestive Heart Failure Brother   . Hypertension Brother   .  Cerebral palsy Son   . Congestive Heart Failure Brother     Social History   Socioeconomic History  . Marital status: Widowed    Spouse name: Not on file  . Number of children: Not on file  . Years of education: Not on file  . Highest education level: Not on file  Social Needs  . Financial resource strain: Not on file  . Food insecurity - worry: Not on file  . Food insecurity - inability: Not on file  . Transportation needs - medical: Not on file  . Transportation needs - non-medical: Not on file  Occupational History  . Not on file  Tobacco Use  . Smoking status: Never Smoker  . Smokeless tobacco: Never Used  Substance and Sexual Activity  . Alcohol use: No    Alcohol/week: 0.0 oz  . Drug use: No  . Sexual activity: No  Other Topics Concern  . Not on file  Social History Narrative  . Not on file     Constitutional: Denies fever, malaise, fatigue, headache or abrupt weight changes.  Gastrointestinal: Denies abdominal pain, bloating, constipation, diarrhea or blood in the stool.  GU: Denies urgency, frequency, pain with urination, burning sensation, blood in urine, odor or discharge. Musculoskeletal: Pt reports back pain. Denies decrease in range  of motion, difficulty with gait,  or joint pain and swelling.    No other specific complaints in a complete review of systems (except as listed in HPI above).     Objective:   Physical Exam   BP 140/84   Pulse 85   Temp 98 F (36.7 C) (Oral)   Wt 161 lb (73 kg)   SpO2 98%   BMI 25.22 kg/m  Wt Readings from Last 3 Encounters:  07/31/17 161 lb (73 kg)  07/19/17 164 lb 11.2 oz (74.7 kg)  02/28/17 169 lb 4 oz (76.8 kg)    General: Appears her stated age, in NAD. Musculoskeletal: Normal flexion, extension and rotation of the spine. No bony tenderness noted over the spine. Pain with palpation of the right para thoracic muscles. No difficulty with gait.  Neurological: Alert and oriented.   BMET    Component Value  Date/Time   NA 141 07/19/2017 0317   NA 140 08/15/2015 0803   K 3.8 07/19/2017 0317   CL 107 07/19/2017 0317   CO2 25 07/19/2017 0317   GLUCOSE 129 (H) 07/19/2017 0317   BUN 7 07/19/2017 0317   BUN 11 08/15/2015 0803   CREATININE 0.63 07/19/2017 0317   CALCIUM 8.3 (L) 07/19/2017 0317   GFRNONAA >60 07/19/2017 0317   GFRAA >60 07/19/2017 0317    Lipid Panel     Component Value Date/Time   CHOL 242 (H) 02/28/2017 1454   CHOL 274 (H) 08/15/2015 0803   TRIG 338.0 (H) 02/28/2017 1454   HDL 45.80 02/28/2017 1454   HDL 38 (L) 08/15/2015 0803   CHOLHDL 5 02/28/2017 1454   VLDL 67.6 (H) 02/28/2017 1454   LDLCALC 184 (H) 08/15/2015 0803    CBC    Component Value Date/Time   WBC 12.5 (H) 07/19/2017 0317   RBC 4.59 07/19/2017 0317   HGB 13.4 07/19/2017 0317   HCT 39.9 07/19/2017 0317   PLT 377 07/19/2017 0317   MCV 86.9 07/19/2017 0317   MCH 29.1 07/19/2017 0317   MCHC 33.5 07/19/2017 0317   RDW 13.5 07/19/2017 0317    Hgb A1C No results found for: HGBA1C         Assessment & Plan:   [facility] Follow Up for Thoracic Back Pain:  Hospital notes, labs and imagine reviewed This is muscular in nature Encouraged heat, Ibuprofen Stretching exercises given eRx for Robaxin 500 mg PO QHS- sedation caution given  Return precautions discussed Webb Silversmith, NP

## 2017-08-07 ENCOUNTER — Other Ambulatory Visit: Payer: Self-pay | Admitting: Internal Medicine

## 2017-08-19 ENCOUNTER — Encounter: Payer: Self-pay | Admitting: Cardiovascular Disease

## 2017-08-19 ENCOUNTER — Ambulatory Visit (INDEPENDENT_AMBULATORY_CARE_PROVIDER_SITE_OTHER): Payer: Medicare HMO | Admitting: Cardiovascular Disease

## 2017-08-19 VITALS — BP 158/90 | HR 94 | Ht 62.0 in | Wt 165.5 lb

## 2017-08-19 DIAGNOSIS — I10 Essential (primary) hypertension: Secondary | ICD-10-CM

## 2017-08-19 DIAGNOSIS — E785 Hyperlipidemia, unspecified: Secondary | ICD-10-CM | POA: Diagnosis not present

## 2017-08-19 NOTE — Progress Notes (Signed)
Cardiology Office Note   Date:  08/19/2017   ID:  Janice Liu, DOB [birth year], MRN [id number]  PCP:  Jearld Fenton, NP  Cardiologist:   Kathlyn Sacramento, MD   Chief Complaint  Patient presents with  . other    12 month f/u no complaints today. Meds reviewed verbally with pt.      History of Present Illness: Janice Liu is a 77 y.o. female who presents for a follow-up visit regarding hypertension .  She is not diabetic and there is no history of smoking. She has no family history of coronary artery disease. She had a normal nuclear stress test in December 2015 which was done for atypical chest pain.  Unfortunately, her son died recently due to cerebral palsy.  She has been under increased stress since then but overall she has been doing reasonably well with no chest pain, shortness of breath or palpitations. Blood pressure is mildly elevated today but it has been reasonably controlled and her most recent visits with primary care physician.   Past Medical History:  Diagnosis Date  . Allergy   . Chicken pox   . Heart murmur   . Hypertension     History reviewed. No pertinent surgical history.   Current Outpatient Medications  Medication Sig Dispense Refill  . amLODipine (NORVASC) 5 MG tablet Take 1 tablet (5 mg total) by mouth daily. 180 tablet 3  . carvedilol (COREG) 25 MG tablet TAKE 1 TABLET BY MOUTH TWICE A DAY 60 tablet 3  . hydrochlorothiazide (HYDRODIURIL) 12.5 MG tablet Take 1 tablet (12.5 mg total) by mouth daily. 30 tablet 5  . KLOR-CON M20 20 MEQ tablet TAKE 1 TABLET (20 MEQ TOTAL) BY MOUTH 2 (TWO) TIMES DAILY. 240 tablet 4  . methocarbamol (ROBAXIN) 500 MG tablet TAKE 1 TABLET (500 MG TOTAL) BY MOUTH EVERY 8 (EIGHT) HOURS AS NEEDED FOR MUSCLE SPASMS. 15 tablet 0  . simvastatin (ZOCOR) 20 MG tablet Take 1 tablet (20 mg total) by mouth at bedtime. 30 tablet 4   No current facility-administered medications for this visit.     Allergies:   Patient has no  known allergies.    Social History:  The patient  reports that  has never smoked. she has never used smokeless tobacco. She reports that she does not drink alcohol or use drugs.   Family History:  The patient's family history includes Cerebral palsy in her son; Congestive Heart Failure in her brother and brother; Diabetes in her mother; Heart failure in her mother; Hypertension in her brother and mother.    ROS:  Please see the history of present illness.   Otherwise, review of systems are positive for none.   All other systems are reviewed and negative.    PHYSICAL EXAM: VS:  BP (!) 158/90 (BP Location: Left Arm, Patient Position: Sitting, Cuff Size: Normal)   Pulse 94   Ht 5\' 2"  (1.575 m)   Wt 165 lb 8 oz (75.1 kg)   BMI 30.27 kg/m  , BMI Body mass index is 30.27 kg/m. GEN: Well nourished, well developed, in no acute distress  HEENT: normal  Neck: no JVD, carotid bruits, or masses Cardiac: RRR; no murmurs, rubs, or gallops,no edema  Respiratory:  clear to auscultation bilaterally, normal work of breathing GI: soft, nontender, nondistended, + BS MS: no deformity or atrophy  Skin: warm and dry, no rash Neuro:  Strength and sensation are intact Psych: euthymic mood, full affect   EKG:  EKG is ordered today. The ekg ordered today demonstrates normal sinus rhythm with no significant ST or T wave changes.   Recent Labs: 07/18/2017: ALT 23 07/19/2017: BUN 7; Creatinine, Ser 0.63; Hemoglobin 13.4; Platelets 377; Potassium 3.8; Sodium 141    Lipid Panel    Component Value Date/Time   CHOL 242 (H) 02/28/2017 1454   CHOL 274 (H) 08/15/2015 0803   TRIG 338.0 (H) 02/28/2017 1454   HDL 45.80 02/28/2017 1454   HDL 38 (L) 08/15/2015 0803   CHOLHDL 5 02/28/2017 1454   VLDL 67.6 (H) 02/28/2017 1454   LDLCALC 184 (H) 08/15/2015 0803   LDLDIRECT 148.0 02/28/2017 1454      Wt Readings from Last 3 Encounters:  08/19/17 165 lb 8 oz (75.1 kg)  07/31/17 161 lb (73 kg)  07/19/17 164  lb 11.2 oz (74.7 kg)       ASSESSMENT AND PLAN:  1.  Essential hypertension:  Blood pressure is mildly elevated has been reasonably controlled recently.  She continues to be on amlodipine 5 mg daily, carvedilol 25 mg twice daily and hydrochlorothiazide 12.5 mg once daily.  I made no changes in her medications.  2. Hyperlipidemia: On simvastatin   Disposition:   She can follow-up with me as needed if she develops cardiac symptoms or blood pressure becomes difficult to control again.  Signed,  Kathlyn Sacramento, MD  08/19/2017 2:07 PM    [place]

## 2017-08-19 NOTE — Patient Instructions (Signed)
Medication Instructions: No changes.   Labwork: None.   Procedures/Testing: None.   Follow-Up: As needed with Dr. Fletcher Anon.   Any Additional Special Instructions Will Be Listed Below (If Applicable).     If you need a refill on your cardiac medications before your next appointment, please call your pharmacy.

## 2017-09-28 ENCOUNTER — Other Ambulatory Visit: Payer: Self-pay | Admitting: Cardiovascular Disease

## 2017-11-11 ENCOUNTER — Other Ambulatory Visit: Payer: Self-pay | Admitting: Internal Medicine

## 2017-12-09 ENCOUNTER — Other Ambulatory Visit: Payer: Self-pay | Admitting: Cardiovascular Disease

## 2018-02-10 ENCOUNTER — Other Ambulatory Visit: Payer: Self-pay | Admitting: Internal Medicine

## 2018-02-19 ENCOUNTER — Ambulatory Visit: Payer: Medicare PPO | Admitting: Internal Medicine

## 2018-02-20 ENCOUNTER — Other Ambulatory Visit: Payer: Self-pay | Admitting: Cardiovascular Disease

## 2018-03-02 ENCOUNTER — Ambulatory Visit (INDEPENDENT_AMBULATORY_CARE_PROVIDER_SITE_OTHER): Payer: Medicare HMO | Admitting: Internal Medicine

## 2018-03-02 ENCOUNTER — Encounter: Payer: Self-pay | Admitting: Internal Medicine

## 2018-03-02 VITALS — BP 138/86 | HR 84 | Temp 98.4°F | Resp 16 | Ht 62.0 in | Wt 165.0 lb

## 2018-03-02 DIAGNOSIS — E7800 Pure hypercholesterolemia, unspecified: Secondary | ICD-10-CM | POA: Diagnosis not present

## 2018-03-02 DIAGNOSIS — I10 Essential (primary) hypertension: Secondary | ICD-10-CM | POA: Diagnosis not present

## 2018-03-02 DIAGNOSIS — Z0000 Encounter for general adult medical examination without abnormal findings: Secondary | ICD-10-CM

## 2018-03-02 DIAGNOSIS — E559 Vitamin D deficiency, unspecified: Secondary | ICD-10-CM | POA: Diagnosis not present

## 2018-03-02 LAB — COMPREHENSIVE METABOLIC PANEL
ALT: 23 U/L (ref 0–35)
AST: 19 U/L (ref 0–37)
Albumin: 4.6 g/dL (ref 3.5–5.2)
Alkaline Phosphatase: 76 U/L (ref 39–117)
BILIRUBIN TOTAL: 0.6 mg/dL (ref 0.2–1.2)
BUN: 10 mg/dL (ref 6–23)
CALCIUM: 9.6 mg/dL (ref 8.4–10.5)
CO2: 28 meq/L (ref 19–32)
Chloride: 101 mEq/L (ref 96–112)
Creatinine, Ser: 0.86 mg/dL (ref 0.40–1.20)
GFR: 67.95 mL/min (ref >60.00)
Glucose, Bld: 109 mg/dL — ABNORMAL HIGH (ref 70–99)
POTASSIUM: 4.2 meq/L (ref 3.5–5.1)
Sodium: 138 mEq/L (ref 135–145)
Total Protein: 8.1 g/dL (ref 6.0–8.3)

## 2018-03-02 LAB — CBC
HEMATOCRIT: 41.2 % (ref 36.0–46.0)
HEMOGLOBIN: 13.7 g/dL (ref 12.0–15.0)
MCHC: 33.3 g/dL (ref 30.0–36.0)
MCV: 86.7 fl (ref 78.0–100.0)
PLATELETS: 436 10*3/uL — AB (ref 150.0–400.0)
RBC: 4.76 Mil/uL (ref 3.87–5.11)
RDW: 14 % (ref 11.5–15.5)
WBC: 12 10*3/uL — ABNORMAL HIGH (ref 4.0–10.5)

## 2018-03-02 LAB — LIPID PANEL
Cholesterol: 206 mg/dL — ABNORMAL HIGH (ref 0–200)
HDL: 49.7 mg/dL (ref >39.00)
NONHDL: 156.54
TRIGLYCERIDES: 230 mg/dL — AB (ref 0.0–149.0)
Total CHOL/HDL Ratio: 4
VLDL: 46 mg/dL — AB (ref 0.0–40.0)

## 2018-03-02 LAB — LDL CHOLESTEROL, DIRECT: Direct LDL: 121 mg/dL

## 2018-03-02 LAB — VITAMIN D 25 HYDROXY (VIT D DEFICIENCY, FRACTURES): VITD: 21.2 ng/mL — ABNORMAL LOW (ref 30.00–100.00)

## 2018-03-02 NOTE — Patient Instructions (Signed)
Health Maintenance for Postmenopausal Women Menopause is a normal process in which your reproductive ability comes to an end. This process happens gradually over a span of months to years, usually between the ages of 22 and 9. Menopause is complete when you have missed 12 consecutive menstrual periods. It is important to talk with your health care provider about some of the most common conditions that affect postmenopausal women, such as heart disease, cancer, and bone loss (osteoporosis). Adopting a healthy lifestyle and getting preventive care can help to promote your health and wellness. Those actions can also lower your chances of developing some of these common conditions. What should I know about menopause? During menopause, you may experience a number of symptoms, such as:  Moderate-to-severe hot flashes.  Night sweats.  Decrease in sex drive.  Mood swings.  Headaches.  Tiredness.  Irritability.  Memory problems.  Insomnia.  Choosing to treat or not to treat menopausal changes is an individual decision that you make with your health care provider. What should I know about hormone replacement therapy and supplements? Hormone therapy products are effective for treating symptoms that are associated with menopause, such as hot flashes and night sweats. Hormone replacement carries certain risks, especially as you become older. If you are thinking about using estrogen or estrogen with progestin treatments, discuss the benefits and risks with your health care provider. What should I know about heart disease and stroke? Heart disease, heart attack, and stroke become more likely as you age. This may be due, in part, to the hormonal changes that your body experiences during menopause. These can affect how your body processes dietary fats, triglycerides, and cholesterol. Heart attack and stroke are both medical emergencies. There are many things that you can do to help prevent heart disease  and stroke:  Have your blood pressure checked at least every 1-2 years. High blood pressure causes heart disease and increases the risk of stroke.  If you are 53-22 years old, ask your health care provider if you should take aspirin to prevent a heart attack or a stroke.  Do not use any tobacco products, including cigarettes, chewing tobacco, or electronic cigarettes. If you need help quitting, ask your health care provider.  It is important to eat a healthy diet and maintain a healthy weight. ? Be sure to include plenty of vegetables, fruits, low-fat dairy products, and lean protein. ? Avoid eating foods that are high in solid fats, added sugars, or salt (sodium).  Get regular exercise. This is one of the most important things that you can do for your health. ? Try to exercise for at least 150 minutes each week. The type of exercise that you do should increase your heart rate and make you sweat. This is known as moderate-intensity exercise. ? Try to do strengthening exercises at least twice each week. Do these in addition to the moderate-intensity exercise.  Know your numbers.Ask your health care provider to check your cholesterol and your blood glucose. Continue to have your blood tested as directed by your health care provider.  What should I know about cancer screening? There are several types of cancer. Take the following steps to reduce your risk and to catch any cancer development as early as possible. Breast Cancer  Practice breast self-awareness. ? This means understanding how your breasts normally appear and feel. ? It also means doing regular breast self-exams. Let your health care provider know about any changes, no matter how small.  If you are 40  or older, have a clinician do a breast exam (clinical breast exam or CBE) every year. Depending on your age, family history, and medical history, it may be recommended that you also have a yearly breast X-ray (mammogram).  If you  have a family history of breast cancer, talk with your health care provider about genetic screening.  If you are at high risk for breast cancer, talk with your health care provider about having an MRI and a mammogram every year.  Breast cancer (BRCA) gene test is recommended for women who have family members with BRCA-related cancers. Results of the assessment will determine the need for genetic counseling and BRCA1 and for BRCA2 testing. BRCA-related cancers include these types: ? Breast. This occurs in males or females. ? Ovarian. ? Tubal. This may also be called fallopian tube cancer. ? Cancer of the abdominal or pelvic lining (peritoneal cancer). ? Prostate. ? Pancreatic.  Cervical, Uterine, and Ovarian Cancer Your health care provider may recommend that you be screened regularly for cancer of the pelvic organs. These include your ovaries, uterus, and vagina. This screening involves a pelvic exam, which includes checking for microscopic changes to the surface of your cervix (Pap test).  For women ages 21-65, health care providers may recommend a pelvic exam and a Pap test every three years. For women ages 79-65, they may recommend the Pap test and pelvic exam, combined with testing for human papilloma virus (HPV), every five years. Some types of HPV increase your risk of cervical cancer. Testing for HPV may also be done on women of any age who have unclear Pap test results.  Other health care providers may not recommend any screening for nonpregnant women who are considered low risk for pelvic cancer and have no symptoms. Ask your health care provider if a screening pelvic exam is right for you.  If you have had past treatment for cervical cancer or a condition that could lead to cancer, you need Pap tests and screening for cancer for at least 20 years after your treatment. If Pap tests have been discontinued for you, your risk factors (such as having a new sexual partner) need to be  reassessed to determine if you should start having screenings again. Some women have medical problems that increase the chance of getting cervical cancer. In these cases, your health care provider may recommend that you have screening and Pap tests more often.  If you have a family history of uterine cancer or ovarian cancer, talk with your health care provider about genetic screening.  If you have vaginal bleeding after reaching menopause, tell your health care provider.  There are currently no reliable tests available to screen for ovarian cancer.  Lung Cancer Lung cancer screening is recommended for adults 69-62 years old who are at high risk for lung cancer because of a history of smoking. A yearly low-dose CT scan of the lungs is recommended if you:  Currently smoke.  Have a history of at least 30 pack-years of smoking and you currently smoke or have quit within the past 15 years. A pack-year is smoking an average of one pack of cigarettes per day for one year.  Yearly screening should:  Continue until it has been 15 years since you quit.  Stop if you develop a health problem that would prevent you from having lung cancer treatment.  Colorectal Cancer  This type of cancer can be detected and can often be prevented.  Routine colorectal cancer screening usually begins at  age 42 and continues through age 45.  If you have risk factors for colon cancer, your health care provider may recommend that you be screened at an earlier age.  If you have a family history of colorectal cancer, talk with your health care provider about genetic screening.  Your health care provider may also recommend using home test kits to check for hidden blood in your stool.  A small camera at the end of a tube can be used to examine your colon directly (sigmoidoscopy or colonoscopy). This is done to check for the earliest forms of colorectal cancer.  Direct examination of the colon should be repeated every  5-10 years until age 71. However, if early forms of precancerous polyps or small growths are found or if you have a family history or genetic risk for colorectal cancer, you may need to be screened more often.  Skin Cancer  Check your skin from head to toe regularly.  Monitor any moles. Be sure to tell your health care provider: ? About any new moles or changes in moles, especially if there is a change in a mole's shape or color. ? If you have a mole that is larger than the size of a pencil eraser.  If any of your family members has a history of skin cancer, especially at a young age, talk with your health care provider about genetic screening.  Always use sunscreen. Apply sunscreen liberally and repeatedly throughout the day.  Whenever you are outside, protect yourself by wearing long sleeves, pants, a wide-brimmed hat, and sunglasses.  What should I know about osteoporosis? Osteoporosis is a condition in which bone destruction happens more quickly than new bone creation. After menopause, you may be at an increased risk for osteoporosis. To help prevent osteoporosis or the bone fractures that can happen because of osteoporosis, the following is recommended:  If you are 46-71 years old, get at least 1,000 mg of calcium and at least 600 mg of vitamin D per day.  If you are older than age 55 but younger than age 65, get at least 1,200 mg of calcium and at least 600 mg of vitamin D per day.  If you are older than age 54, get at least 1,200 mg of calcium and at least 800 mg of vitamin D per day.  Smoking and excessive alcohol intake increase the risk of osteoporosis. Eat foods that are rich in calcium and vitamin D, and do weight-bearing exercises several times each week as directed by your health care provider. What should I know about how menopause affects my mental health? Depression may occur at any age, but it is more common as you become older. Common symptoms of depression  include:  Low or sad mood.  Changes in sleep patterns.  Changes in appetite or eating patterns.  Feeling an overall lack of motivation or enjoyment of activities that you previously enjoyed.  Frequent crying spells.  Talk with your health care provider if you think that you are experiencing depression. What should I know about immunizations? It is important that you get and maintain your immunizations. These include:  Tetanus, diphtheria, and pertussis (Tdap) booster vaccine.  Influenza every year before the flu season begins.  Pneumonia vaccine.  Shingles vaccine.  Your health care provider may also recommend other immunizations. This information is not intended to replace advice given to you by your health care provider. Make sure you discuss any questions you have with your health care provider. Document Released: 09/20/2005  Document Revised: 02/16/2016 Document Reviewed: 05/02/2015 Elsevier Interactive Patient Education  2018 [employer]  

## 2018-03-02 NOTE — Progress Notes (Signed)
HPI:  Pt presents to the clinic today for her Medicare Wellness Exam. She is also due to follow up chronic conditions.  HTN: Her BP today is 138/86. She is taking Amlodipine, Carvedilol, HCTZ, and Potassium as prescribed. She follows with Dr. Fletcher Anon. ECG from 08/2017 reviewed.  HLD: Her last LDL was 148, 02/2017. She denies myalgias on Simvastatin. She tries to consume a low fat diet.  Past Medical History:  Diagnosis Date  . Allergy   . Chicken pox   . Heart murmur   . Hypertension     Current Outpatient Medications  Medication Sig Dispense Refill  . amLODipine (NORVASC) 5 MG tablet TAKE 1 TABLET (5 MG TOTAL) BY MOUTH DAILY. 180 tablet 3  . carvedilol (COREG) 25 MG tablet TAKE 1 TABLET BY MOUTH TWICE A DAY 60 tablet 3  . hydrochlorothiazide (HYDRODIURIL) 12.5 MG tablet Take 1 tablet (12.5 mg total) by mouth daily. 30 tablet 5  . KLOR-CON M20 20 MEQ tablet TAKE 1 TABLET (20 MEQ TOTAL) BY MOUTH 2 (TWO) TIMES DAILY. 240 tablet 4  . methocarbamol (ROBAXIN) 500 MG tablet TAKE 1 TABLET (500 MG TOTAL) BY MOUTH EVERY 8 (EIGHT) HOURS AS NEEDED FOR MUSCLE SPASMS. 15 tablet 0  . simvastatin (ZOCOR) 20 MG tablet TAKE 1 TABLET (20 MG TOTAL) BY MOUTH AT BEDTIME. 30 tablet 0   No current facility-administered medications for this visit.     No Known Allergies  Family History  Problem Relation Age of Onset  . Heart failure Mother   . Diabetes Mother   . Hypertension Mother   . Congestive Heart Failure Brother   . Hypertension Brother   . Cerebral palsy Son   . Congestive Heart Failure Brother     Social History   Socioeconomic History  . Marital status: Widowed    Spouse name: Not on file  . Number of children: Not on file  . Years of education: Not on file  . Highest education level: Not on file  Occupational History  . Not on file  Social Needs  . Financial resource strain: Not on file  . Food insecurity:    Worry: Not on file    Inability: Not on file  . Transportation needs:     Medical: Not on file    Non-medical: Not on file  Tobacco Use  . Smoking status: Never Smoker  . Smokeless tobacco: Never Used  Substance and Sexual Activity  . Alcohol use: No    Alcohol/week: 0.0 oz  . Drug use: No  . Sexual activity: Never  Lifestyle  . Physical activity:    Days per week: Not on file    Minutes per session: Not on file  . Stress: Not on file  Relationships  . Social connections:    Talks on phone: Not on file    Gets together: Not on file    Attends religious service: Not on file    Active member of club or organization: Not on file    Attends meetings of clubs or organizations: Not on file    Relationship status: Not on file  . Intimate partner violence:    Fear of current or ex partner: Not on file    Emotionally abused: Not on file    Physically abused: Not on file    Forced sexual activity: Not on file  Other Topics Concern  . Not on file  Social History Narrative  . Not on file    Hospitiliaztions: None  Health  Maintenance:    Flu: 07/2017  Tetanus: never  Pneumovax: never  Prevnar: never  Zostavax: never  Shingrix: never  Mammogram: never  Pap Smear: never  Bone Density: never  Colon Screening: never  Eye Doctor:  Dental Exam:   Providers:   PCP: Webb Silversmith, NP-C  Cardiologist: Dr. Fletcher Anon    I have personally reviewed and have noted:  1. The patient's medical and social history 2. Their use of alcohol, tobacco or illicit drugs 3. Their current medications and supplements 4. The patient's functional ability including ADL's, fall risks, home safety risks and hearing or visual impairment. 5. Diet and physical activities 6. Evidence for depression or mood disorder  Subjective:   Review of Systems:   Constitutional: Denies fever, malaise, fatigue, headache or abrupt weight changes.  HEENT: Denies eye pain, eye redness, ear pain, ringing in the ears, wax buildup, runny nose, nasal congestion, bloody nose, or sore  throat. Respiratory: Denies difficulty breathing, shortness of breath, cough or sputum production.   Cardiovascular: Denies chest pain, chest tightness, palpitations or swelling in the hands or feet.  Gastrointestinal: Denies abdominal pain, bloating, constipation, diarrhea or blood in the stool.  GU: Denies urgency, frequency, pain with urination, burning sensation, blood in urine, odor or discharge. Musculoskeletal: Denies decrease in range of motion, difficulty with gait, muscle pain or joint pain and swelling.  Skin: Denies redness, rashes, lesions or ulcercations.  Neurological: Denies dizziness, difficulty with memory, difficulty with speech or problems with balance and coordination.  Psych: Denies anxiety, depression, SI/HI.  No other specific complaints in a complete review of systems (except as listed in HPI above).  Objective:  PE:   BP 138/86 (BP Location: Left Arm, Patient Position: Sitting, Cuff Size: Small)   Pulse 84   Temp 98.4 F (36.9 C) (Oral)   Resp 16   Ht 5\' 2"  (1.575 m)   Wt 165 lb (74.8 kg)   SpO2 96%   BMI 30.18 kg/m   Wt Readings from Last 3 Encounters:  08/19/17 165 lb 8 oz (75.1 kg)  07/31/17 161 lb (73 kg)  07/19/17 164 lb 11.2 oz (74.7 kg)    General: Appears her stated age, well developed, well nourished in NAD. Skin: Warm, dry and intact.  HEENT: Head: normal shape and size; Eyes: sclera white, no icterus, conjunctiva pink, PERRLA and EOMs intact; Ears: Tm's gray and intact, normal light reflex; Throat/Mouth: Teeth present, mucosa pink and moist, no exudate, lesions or ulcerations noted.  Neck: Neck supple, trachea midline. No masses, lumps or thyromegaly present.  Cardiovascular: Normal rate and rhythm. S1,S2 noted.  No murmur, rubs or gallops noted. No JVD or BLE edema. No carotid bruits noted. Pulmonary/Chest: Normal effort and positive vesicular breath sounds. No respiratory distress. No wheezes, rales or ronchi noted.  Abdomen: Soft and  nontender. Normal bowel sounds. No distention or masses noted. Liver, spleen and kidneys non palpable. Musculoskeletal: Strength 5/5 BUE/BLE. No signs of joint swelling.  Neurological: Alert and oriented. Cranial nerves II-XII grossly intact. Coordination normal.  Psychiatric: Mood and affect normal. Behavior is normal. Judgment and thought content normal.     BMET    Component Value Date/Time   NA 141 07/19/2017 0317   NA 140 08/15/2015 0803   K 3.8 07/19/2017 0317   CL 107 07/19/2017 0317   CO2 25 07/19/2017 0317   GLUCOSE 129 (H) 07/19/2017 0317   BUN 7 07/19/2017 0317   BUN 11 08/15/2015 0803   CREATININE 0.63 07/19/2017 0317  CALCIUM 8.3 (L) 07/19/2017 0317   GFRNONAA >60 07/19/2017 0317   GFRAA >60 07/19/2017 0317    Lipid Panel     Component Value Date/Time   CHOL 242 (H) 02/28/2017 1454   CHOL 274 (H) 08/15/2015 0803   TRIG 338.0 (H) 02/28/2017 1454   HDL 45.80 02/28/2017 1454   HDL 38 (L) 08/15/2015 0803   CHOLHDL 5 02/28/2017 1454   VLDL 67.6 (H) 02/28/2017 1454   LDLCALC 184 (H) 08/15/2015 0803    CBC    Component Value Date/Time   WBC 12.5 (H) 07/19/2017 0317   RBC 4.59 07/19/2017 0317   HGB 13.4 07/19/2017 0317   HCT 39.9 07/19/2017 0317   PLT 377 07/19/2017 0317   MCV 86.9 07/19/2017 0317   MCH 29.1 07/19/2017 0317   MCHC 33.5 07/19/2017 0317   RDW 13.5 07/19/2017 0317    Hgb A1C No results found for: HGBA1C    Assessment and Plan:   Medicare Annual Wellness Visit:  Diet: She does eat meat. She does eat fruits and veggies daily. She tries to avoid fried foods. She mostly water, decaf tea Physical activity: Yard work Depression/mood screen: negative Hearing: Intact to Dietitian acuity: Grossly normal ADLs: Capable Fall risk: None Home safety: Good Cognitive evaluation: Intact to orientation, naming, recall and repetition EOL planning: No adv directives, full code/ I agree  Preventative Medicine: She declines flu,  tetanus, pneumovax, prevnar, zostovax, shingrix, mammogram, pap smear, bone density, colon screening. Encouraged her to consume a balanced diet and exercise regimen. Advised her to see an eye doctor and dentist annually. Will check CBC, CMET, Lipid and Vit D today.   Next appointment: 1 year, Medicare Wellness Exam   Webb Silversmith, NP

## 2018-03-02 NOTE — Assessment & Plan Note (Signed)
Controlled on Amlodipine, Carvedilol, HCTZ and Potassium CBC and CMET today Reinforced DASH diet

## 2018-03-02 NOTE — Assessment & Plan Note (Signed)
CMET and lipid profile today Encouraged her to consume a low fat diet Continue Simvastatin

## 2018-03-03 ENCOUNTER — Other Ambulatory Visit: Payer: Self-pay | Admitting: Internal Medicine

## 2018-03-03 DIAGNOSIS — E559 Vitamin D deficiency, unspecified: Secondary | ICD-10-CM

## 2018-03-03 DIAGNOSIS — E782 Mixed hyperlipidemia: Secondary | ICD-10-CM

## 2018-03-03 MED ORDER — VITAMIN D (ERGOCALCIFEROL) 1.25 MG (50000 UNIT) PO CAPS: 50000.0000 [IU] | ORAL_CAPSULE | ORAL | 0 refills | Status: DC

## 2018-03-09 ENCOUNTER — Other Ambulatory Visit: Payer: Self-pay | Admitting: Internal Medicine

## 2018-03-28 ENCOUNTER — Other Ambulatory Visit: Payer: Self-pay | Admitting: Cardiovascular Disease

## 2018-04-05 ENCOUNTER — Other Ambulatory Visit: Payer: Self-pay | Admitting: Internal Medicine

## 2018-05-26 ENCOUNTER — Other Ambulatory Visit: Payer: Self-pay | Admitting: Internal Medicine

## 2018-06-09 ENCOUNTER — Other Ambulatory Visit (INDEPENDENT_AMBULATORY_CARE_PROVIDER_SITE_OTHER): Payer: Medicare HMO

## 2018-06-09 ENCOUNTER — Ambulatory Visit (INDEPENDENT_AMBULATORY_CARE_PROVIDER_SITE_OTHER): Payer: Medicare HMO

## 2018-06-09 DIAGNOSIS — Z23 Encounter for immunization: Secondary | ICD-10-CM

## 2018-06-09 DIAGNOSIS — E559 Vitamin D deficiency, unspecified: Secondary | ICD-10-CM

## 2018-06-09 DIAGNOSIS — E782 Mixed hyperlipidemia: Secondary | ICD-10-CM | POA: Diagnosis not present

## 2018-06-09 LAB — VITAMIN D 25 HYDROXY (VIT D DEFICIENCY, FRACTURES): VITD: 32.45 ng/mL (ref 30.00–100.00)

## 2018-06-09 LAB — LIPID PANEL
Cholesterol: 208 mg/dL — ABNORMAL HIGH (ref 0–200)
HDL: 43.6 mg/dL (ref >39.00)
NONHDL: 164.27
Total CHOL/HDL Ratio: 5
Triglycerides: 282 mg/dL — ABNORMAL HIGH (ref 0.0–149.0)
VLDL: 56.4 mg/dL — ABNORMAL HIGH (ref 0.0–40.0)

## 2018-06-09 LAB — LDL CHOLESTEROL, DIRECT: Direct LDL: 129 mg/dL

## 2018-06-11 ENCOUNTER — Other Ambulatory Visit: Payer: Self-pay | Admitting: Internal Medicine

## 2018-06-29 ENCOUNTER — Other Ambulatory Visit: Payer: Self-pay | Admitting: Internal Medicine

## 2018-06-29 NOTE — Telephone Encounter (Signed)
Called pt in reference to lab results from Oct and refill request, no answer and unable to lmovm

## 2018-07-17 ENCOUNTER — Other Ambulatory Visit: Payer: Self-pay | Admitting: Internal Medicine

## 2018-07-23 ENCOUNTER — Other Ambulatory Visit: Payer: Self-pay | Admitting: Cardiovascular Disease

## 2018-07-29 ENCOUNTER — Other Ambulatory Visit: Payer: Self-pay | Admitting: Internal Medicine

## 2018-07-29 NOTE — Telephone Encounter (Signed)
Received refill request for Simvastatin and see that pt never called the office back from 06/2018...  Called pt no answer and VM not set up

## 2018-12-21 ENCOUNTER — Other Ambulatory Visit: Payer: Self-pay | Admitting: Cardiovascular Disease

## 2019-01-23 ENCOUNTER — Other Ambulatory Visit: Payer: Self-pay | Admitting: Cardiovascular Disease

## 2019-02-20 ENCOUNTER — Other Ambulatory Visit: Payer: Self-pay | Admitting: Cardiovascular Disease

## 2019-03-03 ENCOUNTER — Other Ambulatory Visit: Payer: Self-pay | Admitting: Cardiovascular Disease

## 2019-03-03 NOTE — Telephone Encounter (Signed)
Pt overdue for yearly follow up pt needing refills. Please contact pt for follow up appointment.

## 2019-03-03 NOTE — Telephone Encounter (Signed)
No ans no vm   °

## 2019-03-09 ENCOUNTER — Encounter: Payer: Self-pay | Admitting: Internal Medicine

## 2019-03-09 ENCOUNTER — Ambulatory Visit (INDEPENDENT_AMBULATORY_CARE_PROVIDER_SITE_OTHER): Payer: Medicare HMO | Admitting: Internal Medicine

## 2019-03-09 ENCOUNTER — Other Ambulatory Visit: Payer: Self-pay

## 2019-03-09 VITALS — BP 145/90 | HR 96 | Temp 98.6°F | Wt 163.0 lb

## 2019-03-09 DIAGNOSIS — E7800 Pure hypercholesterolemia, unspecified: Secondary | ICD-10-CM | POA: Diagnosis not present

## 2019-03-09 DIAGNOSIS — E559 Vitamin D deficiency, unspecified: Secondary | ICD-10-CM | POA: Diagnosis not present

## 2019-03-09 DIAGNOSIS — I10 Essential (primary) hypertension: Secondary | ICD-10-CM | POA: Diagnosis not present

## 2019-03-09 DIAGNOSIS — Z0000 Encounter for general adult medical examination without abnormal findings: Secondary | ICD-10-CM

## 2019-03-09 DIAGNOSIS — Z9109 Other allergy status, other than to drugs and biological substances: Secondary | ICD-10-CM

## 2019-03-09 DIAGNOSIS — R7309 Other abnormal glucose: Secondary | ICD-10-CM

## 2019-03-09 LAB — VITAMIN D 25 HYDROXY (VIT D DEFICIENCY, FRACTURES): VITD: 24.23 ng/mL — ABNORMAL LOW (ref 30.00–100.00)

## 2019-03-09 LAB — COMPREHENSIVE METABOLIC PANEL
ALT: 22 U/L (ref 0–35)
AST: 20 U/L (ref 0–37)
Albumin: 4.3 g/dL (ref 3.5–5.2)
Alkaline Phosphatase: 71 U/L (ref 39–117)
BUN: 13 mg/dL (ref 6–23)
CO2: 26 mEq/L (ref 19–32)
Calcium: 9.5 mg/dL (ref 8.4–10.5)
Chloride: 102 mEq/L (ref 96–112)
Creatinine, Ser: 0.74 mg/dL (ref 0.40–1.20)
GFR: 75.84 mL/min (ref >60.00)
Glucose, Bld: 135 mg/dL — ABNORMAL HIGH (ref 70–99)
Potassium: 4.3 mEq/L (ref 3.5–5.1)
Sodium: 139 mEq/L (ref 135–145)
Total Bilirubin: 0.7 mg/dL (ref 0.2–1.2)
Total Protein: 7.3 g/dL (ref 6.0–8.3)

## 2019-03-09 LAB — CBC
HCT: 42.7 % (ref 36.0–46.0)
Hemoglobin: 14.2 g/dL (ref 12.0–15.0)
MCHC: 33.3 g/dL (ref 30.0–36.0)
MCV: 87.5 fl (ref 78.0–100.0)
Platelets: 446 10*3/uL — ABNORMAL HIGH (ref 150.0–400.0)
RBC: 4.88 Mil/uL (ref 3.87–5.11)
RDW: 13.8 % (ref 11.5–15.5)
WBC: 9.6 10*3/uL (ref 4.0–10.5)

## 2019-03-09 LAB — LIPID PANEL
Cholesterol: 283 mg/dL — ABNORMAL HIGH (ref 0–200)
HDL: 45 mg/dL (ref >39.00)
NonHDL: 238.16
Total CHOL/HDL Ratio: 6
Triglycerides: 363 mg/dL — ABNORMAL HIGH (ref 0.0–149.0)
VLDL: 72.6 mg/dL — ABNORMAL HIGH (ref 0.0–40.0)

## 2019-03-09 LAB — LDL CHOLESTEROL, DIRECT: Direct LDL: 174 mg/dL

## 2019-03-09 MED ORDER — HYDROCHLOROTHIAZIDE 12.5 MG PO TABS: 12.5000 mg | ORAL_TABLET | Freq: Every day | ORAL | 3 refills | Status: DC

## 2019-03-09 MED ORDER — SIMVASTATIN 20 MG PO TABS: ORAL_TABLET | ORAL | 3 refills | Status: DC

## 2019-03-09 MED ORDER — AMLODIPINE BESYLATE 5 MG PO TABS: 5.0000 mg | ORAL_TABLET | Freq: Every day | ORAL | 3 refills | Status: DC

## 2019-03-09 MED ORDER — CARVEDILOL 25 MG PO TABS: 25.0000 mg | ORAL_TABLET | Freq: Two times a day (BID) | ORAL | 3 refills | Status: DC

## 2019-03-09 MED ORDER — POTASSIUM CHLORIDE CRYS ER 20 MEQ PO TBCR: 20.0000 meq | EXTENDED_RELEASE_TABLET | Freq: Two times a day (BID) | ORAL | 3 refills | Status: DC

## 2019-03-09 NOTE — Patient Instructions (Signed)

## 2019-03-09 NOTE — Assessment & Plan Note (Signed)
Encouraged her to consume a low fat diet CMET and lipid profile today Continue Simvastatin for now

## 2019-03-09 NOTE — Assessment & Plan Note (Signed)
CBC and CMET today Continue Amlodipine, HCTZ, Potassium Advised her to increase Carvedilol to BID as prescribed Reinforced DASH diet and exercise for weight loss Will monitr

## 2019-03-09 NOTE — Progress Notes (Signed)
Subjective:    Patient ID: Janice Liu, female    DOB: 23-Feb-1941, 78 y.o.   MRN: [id number]  HPI:  Pt presents to the clinic today for her subsequent Medicare Wellness Exam. She is also due to follow up chronic conditions.  HTN: Her BP today is 145/90. She is taking Amlodipine, HCTZ and Potassium as prescribed. She reports she is only taking Carvedilol 1 x day as opposed to 2 as prescribed. ECG from 08/2017 reviewed.  HLD: Her last LDL was 129, 05/2018. She denies myalgias on Simvastatin. She tries to consume a low fat diet.  Vit D Deficiency: She is no longer taking Ergocalciferol. She is not taking any Vit D or Calcium OTC.  Past Medical History:  Diagnosis Date  . Allergy   . Chicken pox   . Heart murmur   . Hypertension     Current Outpatient Medications  Medication Sig Dispense Refill  . amLODipine (NORVASC) 5 MG tablet TAKE 1 TABLET BY MOUTH EVERY DAY 30 tablet 0  . carvedilol (COREG) 25 MG tablet TAKE 1 TABLET BY MOUTH TWICE A DAY 180 tablet 1  . hydrochlorothiazide (HYDRODIURIL) 12.5 MG tablet TAKE 1 TABLET BY MOUTH EVERY DAY 90 tablet 2  . KLOR-CON M20 20 MEQ tablet TAKE 1 TABLET (20 MEQ TOTAL) BY MOUTH 2 (TWO) TIMES DAILY. 180 tablet 1  . simvastatin (ZOCOR) 20 MG tablet TAKE 1 TABLET BY MOUTH EVERYDAY AT BEDTIME 90 tablet 0   No current facility-administered medications for this visit.     No Known Allergies  Family History  Problem Relation Age of Onset  . Heart failure Mother   . Diabetes Mother   . Hypertension Mother   . Congestive Heart Failure Brother   . Hypertension Brother   . Cerebral palsy Son   . Congestive Heart Failure Brother     Social History   Socioeconomic History  . Marital status: Widowed    Spouse name: Not on file  . Number of children: Not on file  . Years of education: Not on file  . Highest education level: Not on file  Occupational History  . Not on file  Social Needs  . Financial resource strain: Not on file  . Food  insecurity    Worry: Not on file    Inability: Not on file  . Transportation needs    Medical: Not on file    Non-medical: Not on file  Tobacco Use  . Smoking status: Never Smoker  . Smokeless tobacco: Never Used  Substance and Sexual Activity  . Alcohol use: No    Alcohol/week: 0.0 standard drinks  . Drug use: No  . Sexual activity: Never  Lifestyle  . Physical activity    Days per week: Not on file    Minutes per session: Not on file  . Stress: Not on file  Relationships  . Social Herbalist on phone: Not on file    Gets together: Not on file    Attends religious service: Not on file    Active member of club or organization: Not on file    Attends meetings of clubs or organizations: Not on file    Relationship status: Not on file  . Intimate partner violence    Fear of current or ex partner: Not on file    Emotionally abused: Not on file    Physically abused: Not on file    Forced sexual activity: Not on file  Other Topics  Concern  . Not on file  Social History Narrative  . Not on file    Hospitiliaztions: None  Health Maintenance:    Flu: 05/2018  Tetanus: never  Pneumovax: never  Prevnar: never  Zostavax: never  Mammogram: never  Pap Smear: never  Bone Density: never  Colon Screening: never  Eye Doctor: as needed  Dental Exam: as needed   Providers:   PCP: Webb Silversmith, NP-C  I have personally reviewed and have noted:  1. The patient's medical and social history 2. Their use of alcohol, tobacco or illicit drugs 3. Their current medications and supplements 4. The patient's functional ability including ADL's, fall risks, home safety risks and hearing or visual impairment. 5. Diet and physical activities 6. Evidence for depression or mood disorder  Subjective:   Review of Systems:   Constitutional: Denies fever, malaise, fatigue, headache or abrupt weight changes.  HEENT: Denies eye pain, eye redness, ear pain, ringing in the ears,  wax buildup, runny nose, nasal congestion, bloody nose, or sore throat. Respiratory: Denies difficulty breathing, shortness of breath, cough or sputum production.   Cardiovascular: Denies chest pain, chest tightness, palpitations or swelling in the hands or feet.  Gastrointestinal: Denies abdominal pain, bloating, constipation, diarrhea or blood in the stool.  GU: Denies urgency, frequency, pain with urination, burning sensation, blood in urine, odor or discharge. Musculoskeletal: Denies decrease in range of motion, difficulty with gait, muscle pain or joint pain and swelling.  Skin: Denies redness, rashes, lesions or ulcercations.  Neurological: Denies dizziness, difficulty with memory, difficulty with speech or problems with balance and coordination.  Psych: Denies anxiety, depression, SI/HI.  No other specific complaints in a complete review of systems (except as listed in HPI above).  Objective:  PE:   BP (!) 145/90   Pulse 96   Temp 98.6 F (37 C) (Temporal)   Wt 163 lb (73.9 kg)   SpO2 98%   BMI 29.81 kg/m  Wt Readings from Last 3 Encounters:  03/09/19 163 lb (73.9 kg)  03/02/18 165 lb (74.8 kg)  08/19/17 165 lb 8 oz (75.1 kg)    General: Appears her stated age, well developed, well nourished in NAD. Skin: Warm, dry and intact. No rashes noted. HEENT: Head: normal shape and size; Eyes: sclera white, no icterus, conjunctiva pink, PERRLA and EOMs intact; Ears: Tm's gray and intact, normal light reflex; Throat/Mouth: Teeth present, mucosa pink and moist, no exudate, lesions or ulcerations noted.  Neck: Neck supple, trachea midline. No masses, lumps or thyromegaly present.  Cardiovascular: Normal rate and rhythm. S1,S2 noted.  No murmur, rubs or gallops noted. No JVD or BLE edema. No carotid bruits noted. Pulmonary/Chest: Normal effort and positive vesicular breath sounds. No respiratory distress. No wheezes, rales or ronchi noted.  Abdomen: Soft and nontender. Normal bowel  sounds. No distention or masses noted. Liver, spleen and kidneys non palpable. Musculoskeletal: Strength 5/5 BUE/BLE. No difficulty with gait. Neurological: Alert and oriented. Cranial nerves II-XII grossly intact. Coordination normal.  Psychiatric: Mood and affect normal. Behavior is normal. Judgment and thought content normal.     BMET    Component Value Date/Time   NA 138 03/02/2018 1438   NA 140 08/15/2015 0803   K 4.2 03/02/2018 1438   CL 101 03/02/2018 1438   CO2 28 03/02/2018 1438   GLUCOSE 109 (H) 03/02/2018 1438   BUN 10 03/02/2018 1438   BUN 11 08/15/2015 0803   CREATININE 0.86 03/02/2018 1438   CALCIUM 9.6 03/02/2018 1438  GFRNONAA >60 07/19/2017 0317   GFRAA >60 07/19/2017 0317    Lipid Panel     Component Value Date/Time   CHOL 208 (H) 06/09/2018 0847   CHOL 274 (H) 08/15/2015 0803   TRIG 282.0 (H) 06/09/2018 0847   HDL 43.60 06/09/2018 0847   HDL 38 (L) 08/15/2015 0803   CHOLHDL 5 06/09/2018 0847   VLDL 56.4 (H) 06/09/2018 0847   LDLCALC 184 (H) 08/15/2015 0803    CBC    Component Value Date/Time   WBC 12.0 (H) 03/02/2018 1438   RBC 4.76 03/02/2018 1438   HGB 13.7 03/02/2018 1438   HCT 41.2 03/02/2018 1438   PLT 436.0 (H) 03/02/2018 1438   MCV 86.7 03/02/2018 1438   MCH 29.1 07/19/2017 0317   MCHC 33.3 03/02/2018 1438   RDW 14.0 03/02/2018 1438    Hgb A1C No results found for: HGBA1C    Assessment and Plan:   Medicare Annual Wellness Visit:  Diet: She does eat meat. She consumes fruits and veggies daily. She does not eat fried foods. She drinks mostly water. Physical activity: Yard work Depression/mood screen: Negative Hearing: Intact to Dietitian acuity: Grossly normal, performs ADLs: Capable Fall risk: None Home safety: Good Cognitive evaluation: Intact to orientation, naming, recall and repetition EOL planning: Adv directives, full code/ I agree  Preventative Medicine: Encouraged her to get a flu shot in the fall.  She declines tetanus, pneumovax, prevnar, zostovax, shingrix. She declines mammogram, pap smear, bone density, colon screening. Encouraged him to consume a balanced diet and exercise regimen. Advised her to see an eye doctor and dentist annually. Will check CBC, CMET, Lipid, Vit D deficiency.   Next appointment: 1 year, Medicare Wellness Exam   Webb Silversmith, NP

## 2019-04-16 MED ORDER — ROSUVASTATIN CALCIUM 10 MG PO TABS: 10.0000 mg | ORAL_TABLET | Freq: Every day | ORAL | 0 refills | Status: DC

## 2019-04-16 MED ORDER — VITAMIN D (ERGOCALCIFEROL) 1.25 MG (50000 UNIT) PO CAPS: 50000.0000 [IU] | ORAL_CAPSULE | ORAL | 0 refills | Status: DC

## 2019-04-16 NOTE — Addendum Note (Signed)
Addended by: Lurlean Nanny on: 04/16/2019 08:49 AM   Modules accepted: Orders

## 2019-05-21 ENCOUNTER — Telehealth: Payer: Self-pay | Admitting: Internal Medicine

## 2019-05-21 DIAGNOSIS — R69 Illness, unspecified: Secondary | ICD-10-CM | POA: Diagnosis not present

## 2019-05-21 NOTE — Telephone Encounter (Signed)
Patient received letter in the mail about her labs. She stated that she was fasting for those labs.   She schedule a lab recheck in November as advised to do in letter she received

## 2019-05-26 NOTE — Telephone Encounter (Signed)
No ans no vm   °

## 2019-06-01 NOTE — Telephone Encounter (Signed)
No ans no vm .  3 attempts to schedule.  Closing encounter.

## 2019-07-01 ENCOUNTER — Other Ambulatory Visit (INDEPENDENT_AMBULATORY_CARE_PROVIDER_SITE_OTHER): Payer: Medicare HMO

## 2019-07-01 DIAGNOSIS — E559 Vitamin D deficiency, unspecified: Secondary | ICD-10-CM | POA: Diagnosis not present

## 2019-07-01 DIAGNOSIS — I10 Essential (primary) hypertension: Secondary | ICD-10-CM | POA: Diagnosis not present

## 2019-07-01 DIAGNOSIS — R7309 Other abnormal glucose: Secondary | ICD-10-CM

## 2019-07-01 DIAGNOSIS — E7800 Pure hypercholesterolemia, unspecified: Secondary | ICD-10-CM | POA: Diagnosis not present

## 2019-07-01 LAB — COMPREHENSIVE METABOLIC PANEL
ALT: 18 U/L (ref 0–35)
AST: 22 U/L (ref 0–37)
Albumin: 4.6 g/dL (ref 3.5–5.2)
Alkaline Phosphatase: 57 U/L (ref 39–117)
BUN: 11 mg/dL (ref 6–23)
CO2: 26 mEq/L (ref 19–32)
Calcium: 9.6 mg/dL (ref 8.4–10.5)
Chloride: 102 mEq/L (ref 96–112)
Creatinine, Ser: 0.8 mg/dL (ref 0.40–1.20)
GFR: 69.26 mL/min (ref >60.00)
Glucose, Bld: 142 mg/dL — ABNORMAL HIGH (ref 70–99)
Potassium: 3.5 mEq/L (ref 3.5–5.1)
Sodium: 139 mEq/L (ref 135–145)
Total Bilirubin: 1 mg/dL (ref 0.2–1.2)
Total Protein: 8 g/dL (ref 6.0–8.3)

## 2019-07-01 LAB — HEMOGLOBIN A1C: Hgb A1c MFr Bld: 7.2 % — ABNORMAL HIGH (ref 4.6–6.5)

## 2019-07-01 LAB — LIPID PANEL
Cholesterol: 171 mg/dL (ref 0–200)
HDL: 45.6 mg/dL (ref >39.00)
NonHDL: 124.92
Total CHOL/HDL Ratio: 4
Triglycerides: 269 mg/dL — ABNORMAL HIGH (ref 0.0–149.0)
VLDL: 53.8 mg/dL — ABNORMAL HIGH (ref 0.0–40.0)

## 2019-07-01 LAB — LDL CHOLESTEROL, DIRECT: Direct LDL: 87 mg/dL

## 2019-07-01 LAB — VITAMIN D 25 HYDROXY (VIT D DEFICIENCY, FRACTURES): VITD: 40.64 ng/mL (ref 30.00–100.00)

## 2019-07-05 ENCOUNTER — Telehealth: Payer: Self-pay

## 2019-07-05 NOTE — Telephone Encounter (Signed)
Patient calling about her lab results. Please call back. Thank you

## 2019-07-07 MED ORDER — FENOFIBRATE 54 MG PO TABS: 54.0000 mg | ORAL_TABLET | Freq: Every day | ORAL | 2 refills | Status: DC

## 2019-07-07 NOTE — Addendum Note (Signed)
Addended by: Lurlean Nanny on: 07/07/2019 11:53 AM   Modules accepted: Orders

## 2019-08-08 ENCOUNTER — Other Ambulatory Visit: Payer: Self-pay

## 2019-08-08 ENCOUNTER — Inpatient Hospital Stay
Admission: EM | Admit: 2019-08-08 | Discharge: 2019-08-10 | DRG: 247 | Disposition: A | Discharge status: Home / Self Care | Payer: Medicare HMO | Attending: Internal Medicine | Admitting: Internal Medicine

## 2019-08-08 ENCOUNTER — Emergency Department: Payer: Medicare HMO

## 2019-08-08 DIAGNOSIS — Z66 Do not resuscitate: Secondary | ICD-10-CM | POA: Diagnosis present

## 2019-08-08 DIAGNOSIS — I25119 Atherosclerotic heart disease of native coronary artery with unspecified angina pectoris: Secondary | ICD-10-CM | POA: Diagnosis present

## 2019-08-08 DIAGNOSIS — Z8249 Family history of ischemic heart disease and other diseases of the circulatory system: Secondary | ICD-10-CM

## 2019-08-08 DIAGNOSIS — R079 Chest pain, unspecified: Secondary | ICD-10-CM | POA: Diagnosis not present

## 2019-08-08 DIAGNOSIS — E785 Hyperlipidemia, unspecified: Secondary | ICD-10-CM | POA: Diagnosis present

## 2019-08-08 DIAGNOSIS — E876 Hypokalemia: Secondary | ICD-10-CM | POA: Diagnosis present

## 2019-08-08 DIAGNOSIS — I259 Chronic ischemic heart disease, unspecified: Secondary | ICD-10-CM

## 2019-08-08 DIAGNOSIS — I10 Essential (primary) hypertension: Secondary | ICD-10-CM | POA: Diagnosis present

## 2019-08-08 DIAGNOSIS — I214 Non-ST elevation (NSTEMI) myocardial infarction: Secondary | ICD-10-CM | POA: Diagnosis not present

## 2019-08-08 DIAGNOSIS — Z833 Family history of diabetes mellitus: Secondary | ICD-10-CM

## 2019-08-08 DIAGNOSIS — E119 Type 2 diabetes mellitus without complications: Secondary | ICD-10-CM | POA: Diagnosis present

## 2019-08-08 DIAGNOSIS — I493 Ventricular premature depolarization: Secondary | ICD-10-CM | POA: Diagnosis present

## 2019-08-08 DIAGNOSIS — I480 Paroxysmal atrial fibrillation: Secondary | ICD-10-CM | POA: Diagnosis present

## 2019-08-08 DIAGNOSIS — Z79899 Other long term (current) drug therapy: Secondary | ICD-10-CM

## 2019-08-08 DIAGNOSIS — I110 Hypertensive heart disease with heart failure: Secondary | ICD-10-CM | POA: Diagnosis present

## 2019-08-08 DIAGNOSIS — Z20828 Contact with and (suspected) exposure to other viral communicable diseases: Secondary | ICD-10-CM | POA: Diagnosis present

## 2019-08-08 DIAGNOSIS — I472 Ventricular tachycardia: Secondary | ICD-10-CM | POA: Diagnosis present

## 2019-08-08 DIAGNOSIS — I5020 Unspecified systolic (congestive) heart failure: Secondary | ICD-10-CM | POA: Diagnosis present

## 2019-08-08 DIAGNOSIS — I255 Ischemic cardiomyopathy: Secondary | ICD-10-CM

## 2019-08-08 LAB — CBC
HCT: 41.1 % (ref 36.0–46.0)
Hemoglobin: 14.2 g/dL (ref 12.0–15.0)
MCH: 28.7 pg (ref 26.0–34.0)
MCHC: 34.5 g/dL (ref 30.0–36.0)
MCV: 83.2 fL (ref 80.0–100.0)
Platelets: 420 10*3/uL — ABNORMAL HIGH (ref 150–400)
RBC: 4.94 MIL/uL (ref 3.87–5.11)
RDW: 13.2 % (ref 11.5–15.5)
WBC: 14.3 10*3/uL — ABNORMAL HIGH (ref 4.0–10.5)
nRBC: 0 % (ref 0.0–0.2)

## 2019-08-08 LAB — BASIC METABOLIC PANEL
Anion gap: 12 (ref 5–15)
BUN: 14 mg/dL (ref 8–23)
CO2: 22 mmol/L (ref 22–32)
Calcium: 9.3 mg/dL (ref 8.9–10.3)
Chloride: 105 mmol/L (ref 98–111)
Creatinine, Ser: 0.77 mg/dL (ref 0.44–1.00)
GFR calc Af Amer: >60 mL/min (ref >60)
GFR calc non Af Amer: >60 mL/min (ref >60)
Glucose, Bld: 226 mg/dL — ABNORMAL HIGH (ref 70–99)
Potassium: 3.4 mmol/L — ABNORMAL LOW (ref 3.5–5.1)
Sodium: 139 mmol/L (ref 135–145)

## 2019-08-08 LAB — TROPONIN I (HIGH SENSITIVITY): Troponin I (High Sensitivity): 1140 ng/L (ref <18)

## 2019-08-08 NOTE — ED Notes (Signed)
Pt reports history of a fib

## 2019-08-08 NOTE — ED Provider Notes (Signed)
[facility] Emergency Department Provider Note   ____________________________________________   First MD Initiated Contact with Patient 08/08/19 2358     (approximate)  I have reviewed the triage vital signs and the nursing notes.   HISTORY  Chief Complaint Chest Pain    HPI Janice Liu is a 78 y.o. female who presents to the ED from home with a chief complaint of chest pain.  Patient has a history of hypertension, atrial fibrillation not on anticoagulation who reports left-sided chest pressure onset approximately 8 PM.  Felt rapid and irregular heartbeat associated with it.  Associated symptoms of diaphoresis.  Denies shortness of breath, nausea/vomiting, dizziness.  Denies recent fever, cough, abdominal pain, diarrhea.  Denies recent travel or trauma.      Past Medical History:  Diagnosis Date  . Allergy   . Chicken pox   . Heart murmur   . Hypertension     Patient Active Problem List   Diagnosis Date Noted  . HLD (hyperlipidemia) 02/28/2017  . Environmental allergies 08/29/2015  . Essential hypertension 07/15/2014    History reviewed. No pertinent surgical history.  Prior to Admission medications   Medication Sig Start Date End Date Taking? Authorizing Provider  amLODipine (NORVASC) 5 MG tablet Take 1 tablet (5 mg total) by mouth daily. 03/09/19   Jearld Fenton, NP  carvedilol (COREG) 25 MG tablet Take 1 tablet (25 mg total) by mouth 2 (two) times daily. 03/09/19   Jearld Fenton, NP  fenofibrate 54 MG tablet Take 1 tablet (54 mg total) by mouth daily. 07/07/19   Jearld Fenton, NP  hydrochlorothiazide (HYDRODIURIL) 12.5 MG tablet Take 1 tablet (12.5 mg total) by mouth daily. 03/09/19   Jearld Fenton, NP  potassium chloride SA (KLOR-CON M20) 20 MEQ tablet Take 1 tablet (20 mEq total) by mouth 2 (two) times daily. 03/09/19   Jearld Fenton, NP  rosuvastatin (CRESTOR) 10 MG tablet Take 1 tablet (10 mg total) by mouth daily. 04/16/19    Jearld Fenton, NP  Vitamin D, Ergocalciferol, (DRISDOL) 1.25 MG (50000 UT) CAPS capsule Take 1 capsule (50,000 Units total) by mouth every 7 (seven) days. 04/16/19   Jearld Fenton, NP    Allergies Patient has no known allergies.  Family History  Problem Relation Age of Onset  . Heart failure Mother   . Diabetes Mother   . Hypertension Mother   . Congestive Heart Failure Brother   . Hypertension Brother   . Cerebral palsy Son   . Congestive Heart Failure Brother     Social History Social History   Tobacco Use  . Smoking status: Never Smoker  . Smokeless tobacco: Never Used  Substance Use Topics  . Alcohol use: No    Alcohol/week: 0.0 standard drinks  . Drug use: No    Review of Systems  Constitutional: No fever/chills Eyes: No visual changes. ENT: No sore throat. Cardiovascular: Positive for palpitations and chest pain. Respiratory: Denies shortness of breath. Gastrointestinal: No abdominal pain.  No nausea, no vomiting.  No diarrhea.  No constipation. Genitourinary: Negative for dysuria. Musculoskeletal: Negative for back pain. Skin: Negative for rash. Neurological: Negative for headaches, focal weakness or numbness.   ____________________________________________   PHYSICAL EXAM:  VITAL SIGNS: ED Triage Vitals [08/08/19 2257]  Enc Vitals Group     BP 140/68     Pulse Rate 80     Resp 18     Temp 98 F (36.7 C)  Temp src      SpO2 98 %     Weight 165 lb (74.8 kg)     Height 5\' 4"  (1.626 m)     Head Circumference      Peak Flow      Pain Score 8     Pain Loc      Pain Edu?      Excl. in [place]?     Constitutional: Alert and oriented. Well appearing and in no acute distress. Eyes: Conjunctivae are normal. PERRL. EOMI. Head: Atraumatic. Nose: No congestion/rhinnorhea. Mouth/Throat: Mucous membranes are moist.  Oropharynx non-erythematous. Neck: No stridor.   Cardiovascular: Normal rate, regular rhythm. Grossly normal heart sounds.  Good  peripheral circulation. Respiratory: Normal respiratory effort.  No retractions. Lungs CTAB. Gastrointestinal: Soft and nontender. No distention. No abdominal bruits. No CVA tenderness. Musculoskeletal: No lower extremity tenderness nor edema.  No joint effusions. Neurologic:  Normal speech and language. No gross focal neurologic deficits are appreciated.  Skin:  Skin is warm, dry and intact. No rash noted. Psychiatric: Mood and affect are normal. Speech and behavior are normal.  ____________________________________________   LABS (all labs ordered are listed, but only abnormal results are displayed)  Labs Reviewed  BASIC METABOLIC PANEL - Abnormal; Notable for the following components:      Result Value   Potassium 3.4 (*)    Glucose, Bld 226 (*)    All other components within normal limits  CBC - Abnormal; Notable for the following components:   WBC 14.3 (*)    Platelets 420 (*)    All other components within normal limits  TROPONIN I (HIGH SENSITIVITY) - Abnormal; Notable for the following components:   Troponin I (High Sensitivity) 1,140 (*)    All other components within normal limits  SARS CORONAVIRUS 2 (TAT 6-24 HRS)   ____________________________________________  EKG  ED ECG REPORT I, Kelliann Pendergraph J, the attending physician, personally viewed and interpreted this ECG.   Date: 08/09/2019  EKG Time: 2245  Rate: 79  Rhythm: normal EKG, normal sinus rhythm  Axis: Normal  Intervals:none  ST&T Change: Nonspecific  ____________________________________________  RADIOLOGY  ED MD interpretation: No acute cardiopulmonary process  Official radiology report(s): DG Chest 2 View  Result Date: 08/08/2019 CLINICAL DATA:  Chest pain EXAM: CHEST - 2 VIEW COMPARISON:  None. FINDINGS: The heart size and mediastinal contours are within normal limits. Both lungs are clear. Chronic appearing slight superior compression deformity of T6 through T9 vertebral bodies is seen. There is  also anterior compression deformity of T12 vertebral body with 50% loss of vertebral body height, slightly progressed since the prior CT. IMPRESSION: No active cardiopulmonary disease. Electronically Signed   By: Prudencio Pair M.D.   On: 08/08/2019 23:22    ____________________________________________   PROCEDURES  Procedure(s) performed (including Critical Care):  Procedures  CRITICAL CARE Performed by: Paulette Blanch   Total critical care time: 45 minutes  Critical care time was exclusive of separately billable procedures and treating other patients.  Critical care was necessary to treat or prevent imminent or life-threatening deterioration.  Critical care was time spent personally by me on the following activities: development of treatment plan with patient and/or surrogate as well as nursing, discussions with consultants, evaluation of patient's response to treatment, examination of patient, obtaining history from patient or surrogate, ordering and performing treatments and interventions, ordering and review of laboratory studies, ordering and review of radiographic studies, pulse oximetry and re-evaluation of patient's condition.  ____________________________________________  INITIAL IMPRESSION / ASSESSMENT AND PLAN / ED COURSE  As part of my medical decision making, I reviewed the following data within the [facility] notes reviewed and incorporated, Labs reviewed, EKG interpreted, Old chart reviewed, Radiograph reviewed, Discussed with admitting physician and Notes from prior ED visits     Janice Liu was evaluated in Emergency Department on 08/09/2019 for the symptoms described in the history of present illness. She was evaluated in the context of the global COVID-19 pandemic, which necessitated consideration that the patient might be at risk for infection with the SARS-CoV-2 virus that causes COVID-19. Institutional protocols and algorithms that  pertain to the evaluation of patients at risk for COVID-19 are in a state of rapid change based on information released by regulatory bodies including the CDC and federal and state organizations. These policies and algorithms were followed during the patient's care in the ED.    78 year old female with atrial fibrillation not on anticoagulation who presents to the ED with a chief complaint of chest pain. Differential diagnosis includes, but is not limited to, ACS, aortic dissection, pulmonary embolism, cardiac tamponade, pneumothorax, pneumonia, pericarditis, myocarditis, GI-related causes including esophagitis/gastritis, and musculoskeletal chest wall pain.    Initial troponin very elevated; will initiate heparin bolus with drip.  Nitroglycerin for chest discomfort.  Will discuss with hospitalist services to evaluate patient in the emergency department for admission.      ____________________________________________   FINAL CLINICAL IMPRESSION(S) / ED DIAGNOSES  Final diagnoses:  Chest pain due to myocardial ischemia, unspecified ischemic chest pain type  NSTEMI (non-ST elevated myocardial infarction) [facility])     ED Discharge Orders    None       Note:  This document was prepared using Dragon voice recognition software and may include unintentional dictation errors.   Paulette Blanch, MD 08/09/19 [phone]

## 2019-08-08 NOTE — ED Notes (Signed)
Dawn, rn notified of pts' troponin.

## 2019-08-08 NOTE — ED Notes (Signed)
Pt's son Shanon Brow [phone]

## 2019-08-08 NOTE — ED Triage Notes (Signed)
Pt arrives from home via POV with complaint of chest pain unrelieved by 4 baby aspirin. Onset at approx 2000. Pt denies N/V. Pt a&o x4

## 2019-08-09 ENCOUNTER — Other Ambulatory Visit: Payer: Self-pay

## 2019-08-09 ENCOUNTER — Encounter: Payer: Self-pay | Admitting: Cardiology

## 2019-08-09 ENCOUNTER — Inpatient Hospital Stay (HOSPITAL_COMMUNITY)
Admit: 2019-08-09 | Discharge: 2019-08-09 | Disposition: A | Discharge status: Home / Self Care | Payer: Medicare HMO | Attending: Internal Medicine | Admitting: Internal Medicine

## 2019-08-09 ENCOUNTER — Encounter
Admission: EM | Disposition: A | Discharge status: Home / Self Care | Payer: Self-pay | Source: Home / Self Care | Attending: Internal Medicine

## 2019-08-09 DIAGNOSIS — I255 Ischemic cardiomyopathy: Secondary | ICD-10-CM | POA: Diagnosis not present

## 2019-08-09 DIAGNOSIS — I493 Ventricular premature depolarization: Secondary | ICD-10-CM | POA: Diagnosis present

## 2019-08-09 DIAGNOSIS — Z833 Family history of diabetes mellitus: Secondary | ICD-10-CM | POA: Diagnosis not present

## 2019-08-09 DIAGNOSIS — I25119 Atherosclerotic heart disease of native coronary artery with unspecified angina pectoris: Secondary | ICD-10-CM | POA: Diagnosis not present

## 2019-08-09 DIAGNOSIS — I472 Ventricular tachycardia: Secondary | ICD-10-CM | POA: Diagnosis not present

## 2019-08-09 DIAGNOSIS — I110 Hypertensive heart disease with heart failure: Secondary | ICD-10-CM | POA: Diagnosis not present

## 2019-08-09 DIAGNOSIS — I2510 Atherosclerotic heart disease of native coronary artery without angina pectoris: Secondary | ICD-10-CM | POA: Diagnosis not present

## 2019-08-09 DIAGNOSIS — I10 Essential (primary) hypertension: Secondary | ICD-10-CM

## 2019-08-09 DIAGNOSIS — E876 Hypokalemia: Secondary | ICD-10-CM | POA: Diagnosis not present

## 2019-08-09 DIAGNOSIS — Z20828 Contact with and (suspected) exposure to other viral communicable diseases: Secondary | ICD-10-CM | POA: Diagnosis not present

## 2019-08-09 DIAGNOSIS — Z8249 Family history of ischemic heart disease and other diseases of the circulatory system: Secondary | ICD-10-CM | POA: Diagnosis not present

## 2019-08-09 DIAGNOSIS — E782 Mixed hyperlipidemia: Secondary | ICD-10-CM | POA: Diagnosis not present

## 2019-08-09 DIAGNOSIS — Z79899 Other long term (current) drug therapy: Secondary | ICD-10-CM | POA: Diagnosis not present

## 2019-08-09 DIAGNOSIS — I214 Non-ST elevation (NSTEMI) myocardial infarction: Secondary | ICD-10-CM | POA: Diagnosis not present

## 2019-08-09 DIAGNOSIS — I480 Paroxysmal atrial fibrillation: Secondary | ICD-10-CM | POA: Diagnosis not present

## 2019-08-09 DIAGNOSIS — E119 Type 2 diabetes mellitus without complications: Secondary | ICD-10-CM | POA: Diagnosis present

## 2019-08-09 DIAGNOSIS — E785 Hyperlipidemia, unspecified: Secondary | ICD-10-CM | POA: Diagnosis present

## 2019-08-09 DIAGNOSIS — Z66 Do not resuscitate: Secondary | ICD-10-CM | POA: Diagnosis not present

## 2019-08-09 DIAGNOSIS — I5020 Unspecified systolic (congestive) heart failure: Secondary | ICD-10-CM | POA: Diagnosis not present

## 2019-08-09 HISTORY — PX: CORONARY STENT INTERVENTION: CATH118234

## 2019-08-09 HISTORY — PX: LEFT HEART CATH AND CORONARY ANGIOGRAPHY: CATH118249

## 2019-08-09 LAB — APTT: aPTT: 30 seconds (ref 24–36)

## 2019-08-09 LAB — SARS CORONAVIRUS 2 (TAT 6-24 HRS): SARS Coronavirus 2: NEGATIVE

## 2019-08-09 LAB — LIPID PANEL
Cholesterol: 144 mg/dL (ref 0–200)
HDL: 48 mg/dL (ref >40)
LDL Cholesterol: 60 mg/dL (ref 0–99)
Total CHOL/HDL Ratio: 3 RATIO
Triglycerides: 181 mg/dL — ABNORMAL HIGH (ref <150)
VLDL: 36 mg/dL (ref 0–40)

## 2019-08-09 LAB — RESPIRATORY PANEL BY RT PCR (FLU A&B, COVID)
Influenza A by PCR: NEGATIVE
Influenza B by PCR: NEGATIVE
SARS Coronavirus 2 by RT PCR: NEGATIVE

## 2019-08-09 LAB — POCT ACTIVATED CLOTTING TIME
Activated Clotting Time: 252 seconds
Activated Clotting Time: 263 seconds

## 2019-08-09 LAB — MRSA PCR SCREENING: MRSA by PCR: NEGATIVE

## 2019-08-09 LAB — PROTIME-INR
INR: 0.9 (ref 0.8–1.2)
Prothrombin Time: 12.1 seconds (ref 11.4–15.2)

## 2019-08-09 LAB — MAGNESIUM: Magnesium: 2 mg/dL (ref 1.7–2.4)

## 2019-08-09 LAB — HEPARIN LEVEL (UNFRACTIONATED): Heparin Unfractionated: 0.49 IU/mL (ref 0.30–0.70)

## 2019-08-09 LAB — GLUCOSE, CAPILLARY
Glucose-Capillary: 133 mg/dL — ABNORMAL HIGH (ref 70–99)
Glucose-Capillary: 126 mg/dL — ABNORMAL HIGH (ref 70–99)
Glucose-Capillary: 133 mg/dL — ABNORMAL HIGH (ref 70–99)
Glucose-Capillary: 116 mg/dL — ABNORMAL HIGH (ref 70–99)

## 2019-08-09 LAB — TROPONIN I (HIGH SENSITIVITY): Troponin I (High Sensitivity): >27000 ng/L (ref <18)

## 2019-08-09 LAB — TSH: TSH: 1.892 u[IU]/mL (ref 0.350–4.500)

## 2019-08-09 SURGERY — LEFT HEART CATH AND CORONARY ANGIOGRAPHY
Anesthesia: Moderate Sedation

## 2019-08-09 MED ORDER — SODIUM CHLORIDE 0.9 % WEIGHT BASED INFUSION: 1.0000 mL/kg/h | INTRAVENOUS | Status: DC

## 2019-08-09 MED ORDER — ROSUVASTATIN CALCIUM 10 MG PO TABS
10.0000 mg | ORAL_TABLET | Freq: Every day | ORAL | Status: DC
  Administered 2019-08-09 – 2019-08-10 (×2): 10 mg via ORAL
  Filled 2019-08-09 (×3): qty 1

## 2019-08-09 MED ORDER — HEPARIN (PORCINE) IN NACL 1000-0.9 UT/500ML-% IV SOLN
INTRAVENOUS | Status: AC
  Filled 2019-08-09: qty 1000

## 2019-08-09 MED ORDER — HEPARIN SODIUM (PORCINE) 5000 UNIT/ML IJ SOLN: 4000.0000 [IU] | Freq: Once | INTRAMUSCULAR | Status: DC

## 2019-08-09 MED ORDER — HEPARIN (PORCINE) 25000 UT/250ML-% IV SOLN
900.0000 [IU]/h | INTRAVENOUS | Status: DC
  Administered 2019-08-09: 900 [IU]/h via INTRAVENOUS
  Filled 2019-08-09: qty 250

## 2019-08-09 MED ORDER — ASPIRIN 81 MG PO CHEW: 324.0000 mg | CHEWABLE_TABLET | ORAL | Status: AC

## 2019-08-09 MED ORDER — ACETAMINOPHEN 325 MG PO TABS
650.0000 mg | ORAL_TABLET | ORAL | Status: DC | PRN
  Administered 2019-08-09: 650 mg via ORAL
  Filled 2019-08-09: qty 2

## 2019-08-09 MED ORDER — SODIUM CHLORIDE 0.9 % WEIGHT BASED INFUSION
3.0000 mL/kg/h | INTRAVENOUS | Status: DC
  Administered 2019-08-09: 3 mL/kg/h via INTRAVENOUS

## 2019-08-09 MED ORDER — SODIUM CHLORIDE 0.9% FLUSH: 3.0000 mL | INTRAVENOUS | Status: DC | PRN

## 2019-08-09 MED ORDER — HEPARIN (PORCINE) IN NACL 1000-0.9 UT/500ML-% IV SOLN
INTRAVENOUS | Status: DC | PRN
  Administered 2019-08-09: 500 mL

## 2019-08-09 MED ORDER — IOHEXOL 300 MG/ML  SOLN
INTRAMUSCULAR | Status: DC | PRN
  Administered 2019-08-09: 120 mL

## 2019-08-09 MED ORDER — NITROGLYCERIN 1 MG/10 ML FOR IR/CATH LAB
INTRA_ARTERIAL | Status: DC | PRN
  Administered 2019-08-09: 200 ug
  Administered 2019-08-09: 200 ug via INTRACORONARY

## 2019-08-09 MED ORDER — HYDROCHLOROTHIAZIDE 25 MG PO TABS
12.5000 mg | ORAL_TABLET | Freq: Every day | ORAL | Status: DC
  Administered 2019-08-09: 12.5 mg via ORAL
  Filled 2019-08-09: qty 1

## 2019-08-09 MED ORDER — HEPARIN BOLUS VIA INFUSION
4000.0000 [IU] | Freq: Once | INTRAVENOUS | Status: DC
  Filled 2019-08-09: qty 4000

## 2019-08-09 MED ORDER — POTASSIUM CHLORIDE CRYS ER 20 MEQ PO TBCR
40.0000 meq | EXTENDED_RELEASE_TABLET | Freq: Once | ORAL | Status: AC
  Administered 2019-08-09: 40 meq via ORAL
  Filled 2019-08-09: qty 2

## 2019-08-09 MED ORDER — ASPIRIN 81 MG PO CHEW: 81.0000 mg | CHEWABLE_TABLET | ORAL | Status: DC

## 2019-08-09 MED ORDER — ROSUVASTATIN CALCIUM 10 MG PO TABS: 10.0000 mg | ORAL_TABLET | Freq: Every day | ORAL | Status: DC

## 2019-08-09 MED ORDER — POTASSIUM CHLORIDE CRYS ER 20 MEQ PO TBCR
20.0000 meq | EXTENDED_RELEASE_TABLET | Freq: Once | ORAL | Status: AC
  Administered 2019-08-09: 20 meq via ORAL
  Filled 2019-08-09: qty 1

## 2019-08-09 MED ORDER — NITROGLYCERIN 2 % TD OINT
1.0000 [in_us] | TOPICAL_OINTMENT | Freq: Once | TRANSDERMAL | Status: AC
  Administered 2019-08-09: 1 [in_us] via TOPICAL
  Filled 2019-08-09: qty 1

## 2019-08-09 MED ORDER — CHLORHEXIDINE GLUCONATE CLOTH 2 % EX PADS
6.0000 | MEDICATED_PAD | Freq: Every day | CUTANEOUS | Status: DC
  Administered 2019-08-09 – 2019-08-10 (×2): 6 via TOPICAL

## 2019-08-09 MED ORDER — HEPARIN SODIUM (PORCINE) 1000 UNIT/ML IJ SOLN
INTRAMUSCULAR | Status: DC | PRN
  Administered 2019-08-09: 2500 [IU] via INTRAVENOUS
  Administered 2019-08-09: 3500 [IU] via INTRAVENOUS
  Administered 2019-08-09: 4000 [IU] via INTRAVENOUS

## 2019-08-09 MED ORDER — SODIUM CHLORIDE 0.9 % IV SOLN: 250.0000 mL | INTRAVENOUS | Status: DC | PRN

## 2019-08-09 MED ORDER — ASPIRIN 300 MG RE SUPP: 300.0000 mg | RECTAL | Status: AC

## 2019-08-09 MED ORDER — SODIUM CHLORIDE 0.9% FLUSH
3.0000 mL | Freq: Two times a day (BID) | INTRAVENOUS | Status: DC
  Administered 2019-08-09 (×2): 3 mL via INTRAVENOUS

## 2019-08-09 MED ORDER — NITROGLYCERIN 0.4 MG SL SUBL: 0.4000 mg | SUBLINGUAL_TABLET | SUBLINGUAL | Status: DC | PRN

## 2019-08-09 MED ORDER — TICAGRELOR 90 MG PO TABS
ORAL_TABLET | ORAL | Status: DC | PRN
  Administered 2019-08-09: 180 mg via ORAL

## 2019-08-09 MED ORDER — FENTANYL CITRATE (PF) 100 MCG/2ML IJ SOLN
INTRAMUSCULAR | Status: AC
  Filled 2019-08-09: qty 2

## 2019-08-09 MED ORDER — ASPIRIN 81 MG PO CHEW
CHEWABLE_TABLET | ORAL | Status: AC
  Filled 2019-08-09: qty 1

## 2019-08-09 MED ORDER — VERAPAMIL HCL 2.5 MG/ML IV SOLN
INTRAVENOUS | Status: AC
  Filled 2019-08-09: qty 2

## 2019-08-09 MED ORDER — FENOFIBRATE 54 MG PO TABS
54.0000 mg | ORAL_TABLET | Freq: Every day | ORAL | Status: DC
  Filled 2019-08-09: qty 1

## 2019-08-09 MED ORDER — HEPARIN SODIUM (PORCINE) 1000 UNIT/ML IJ SOLN
INTRAMUSCULAR | Status: AC
  Filled 2019-08-09: qty 1

## 2019-08-09 MED ORDER — MIDAZOLAM HCL 2 MG/2ML IJ SOLN
INTRAMUSCULAR | Status: AC
  Filled 2019-08-09: qty 2

## 2019-08-09 MED ORDER — TICAGRELOR 90 MG PO TABS
90.0000 mg | ORAL_TABLET | Freq: Two times a day (BID) | ORAL | Status: DC
  Administered 2019-08-09 – 2019-08-10 (×2): 90 mg via ORAL
  Filled 2019-08-09 (×2): qty 1

## 2019-08-09 MED ORDER — HEART ATTACK BOUNCING BOOK: Freq: Once | Status: DC

## 2019-08-09 MED ORDER — ENOXAPARIN SODIUM 40 MG/0.4ML ~~LOC~~ SOLN
40.0000 mg | SUBCUTANEOUS | Status: DC
  Administered 2019-08-10: 40 mg via SUBCUTANEOUS
  Filled 2019-08-09: qty 0.4

## 2019-08-09 MED ORDER — INSULIN ASPART 100 UNIT/ML ~~LOC~~ SOLN
0.0000 [IU] | Freq: Three times a day (TID) | SUBCUTANEOUS | Status: DC
  Administered 2019-08-10: 1 [IU] via SUBCUTANEOUS
  Filled 2019-08-09: qty 1

## 2019-08-09 MED ORDER — HYDRALAZINE HCL 20 MG/ML IJ SOLN: 10.0000 mg | INTRAMUSCULAR | Status: AC | PRN

## 2019-08-09 MED ORDER — MIDAZOLAM HCL 2 MG/2ML IJ SOLN
INTRAMUSCULAR | Status: DC | PRN
  Administered 2019-08-09: 1 mg via INTRAVENOUS

## 2019-08-09 MED ORDER — FENTANYL CITRATE (PF) 100 MCG/2ML IJ SOLN
INTRAMUSCULAR | Status: DC | PRN
  Administered 2019-08-09: 25 ug via INTRAVENOUS

## 2019-08-09 MED ORDER — AMLODIPINE BESYLATE 5 MG PO TABS
5.0000 mg | ORAL_TABLET | Freq: Every day | ORAL | Status: DC
  Administered 2019-08-09: 5 mg via ORAL
  Filled 2019-08-09: qty 1

## 2019-08-09 MED ORDER — HEPARIN (PORCINE) 25000 UT/250ML-% IV SOLN: 14.0000 [IU]/kg/h | INTRAVENOUS | Status: DC

## 2019-08-09 MED ORDER — CARVEDILOL 25 MG PO TABS
25.0000 mg | ORAL_TABLET | Freq: Two times a day (BID) | ORAL | Status: DC
  Administered 2019-08-09 – 2019-08-10 (×3): 25 mg via ORAL
  Filled 2019-08-09 (×3): qty 1

## 2019-08-09 MED ORDER — FENOFIBRATE 54 MG PO TABS
54.0000 mg | ORAL_TABLET | Freq: Every day | ORAL | Status: DC
  Administered 2019-08-09 – 2019-08-10 (×2): 54 mg via ORAL
  Filled 2019-08-09 (×2): qty 1

## 2019-08-09 MED ORDER — ONDANSETRON HCL 4 MG/2ML IJ SOLN: 4.0000 mg | Freq: Four times a day (QID) | INTRAMUSCULAR | Status: DC | PRN

## 2019-08-09 MED ORDER — TICAGRELOR 90 MG PO TABS
ORAL_TABLET | ORAL | Status: AC
  Filled 2019-08-09: qty 2

## 2019-08-09 MED ORDER — LABETALOL HCL 5 MG/ML IV SOLN: 10.0000 mg | INTRAVENOUS | Status: AC | PRN

## 2019-08-09 MED ORDER — ASPIRIN EC 81 MG PO TBEC
81.0000 mg | DELAYED_RELEASE_TABLET | Freq: Every day | ORAL | Status: DC
  Administered 2019-08-10: 81 mg via ORAL
  Filled 2019-08-09: qty 1

## 2019-08-09 MED ORDER — SODIUM CHLORIDE 0.9% FLUSH
3.0000 mL | Freq: Two times a day (BID) | INTRAVENOUS | Status: DC
  Administered 2019-08-09 – 2019-08-10 (×3): 3 mL via INTRAVENOUS

## 2019-08-09 MED ORDER — ASPIRIN 81 MG PO CHEW: 324.0000 mg | CHEWABLE_TABLET | Freq: Once | ORAL | Status: DC

## 2019-08-09 SURGICAL SUPPLY — 15 items
BALLN MINITREK RX 2.0X12 (BALLOONS) ×2
BALLN ~~LOC~~ TREK RX 2.5X12 (BALLOONS) ×2
BALLOON MINITREK RX 2.0X12 (BALLOONS) ×1 IMPLANT
BALLOON ~~LOC~~ TREK RX 2.5X12 (BALLOONS) ×1 IMPLANT
CATH 5F 110X4 TIG (CATHETERS) ×2 IMPLANT
CATH INFINITI 5FR ANG PIGTAIL (CATHETERS) ×2 IMPLANT
CATH LAUNCHER 6FR EBU3.5 (CATHETERS) ×2 IMPLANT
DEVICE INFLAT 30 PLUS (MISCELLANEOUS) ×2 IMPLANT
DEVICE RAD TR BAND REGULAR (VASCULAR PRODUCTS) ×2 IMPLANT
GLIDESHEATH SLEND SS 6F .021 (SHEATH) ×2 IMPLANT
KIT MANI 3VAL PERCEP (MISCELLANEOUS) ×2 IMPLANT
PACK CARDIAC CATH (CUSTOM PROCEDURE TRAY) ×2 IMPLANT
STENT SYNERGY DES 2.5X16 (Permanent Stent) ×2 IMPLANT
WIRE ROSEN-J .035X260CM (WIRE) ×2 IMPLANT
WIRE RUNTHROUGH .014X180CM (WIRE) ×2 IMPLANT

## 2019-08-09 NOTE — ED Notes (Signed)
Patient resting quietly at this time, no acute distress noted. Patient reports being pain free.

## 2019-08-09 NOTE — ED Notes (Signed)
Dr. Beather Arbour notified of pt's elevated troponin 1140. No new orders received, however pt is in process of being taken to room at this time.

## 2019-08-09 NOTE — Interval H&P Note (Signed)
History and Physical Interval Note:  08/09/2019 1:06 PM  [name]  has presented today for surgery, with the diagnosis of Non ST Elevation Myocardial Infarction.  The various methods of treatment have been discussed with the patient and family. After consideration of risks, benefits and other options for treatment, the patient has consented to  Procedure(s): LEFT HEART CATH AND CORONARY ANGIOGRAPHY (N/A) as a surgical intervention.  The patient's history has been reviewed, patient examined, no change in status, stable for surgery.  I have reviewed the patient's chart and labs.  Questions were answered to the patient's satisfaction.    The patient has agreed to rescind her DNR/DNI during the procedure.  Cath Lab Visit (complete for each Cath Lab visit)  Clinical Evaluation Leading to the Procedure:   ACS: Yes.    Non-ACS:  N/A   Janice Liu

## 2019-08-09 NOTE — H&P (Signed)
History and Physical    Janice Liu [id number] DOB: June 27, 1941 DOA: 08/08/2019  PCP: Jearld Fenton, NP  Patient coming from:home  I have personally briefly reviewed patient's old medical records in [place]  Chief Complaint: chest pain  HPI: Janice Liu is a 78 y.o. female with medical history significant for hypertension and atrial fibrillation not on anticoagulation who was in her usual state of health until couple hours prior to presentation when she started experiencing left-sided chest pressure described as an ache moderate intensity and radiating to the back.  Was associated with palpitations but she denies shortness of breath nausea vomiting or diaphoresis.  Denies cough fever abdominal pain or change in bowel habits.  She states she took 4 baby aspirin but did not experience relief in the pain   ED Course: On arrival to the emergency room vitals were stable with blood pressure 140/68 heart rate 80 O2 sat 98% on room air and she was afebrile.  EKG showed normal sinus rhythm with nonspecific ST-T wave changes.  [name] was elevated at 1140 other labs were unremarkable.  Chest x-ray showed no acute findings.  Patient was placed on nitroglycerin ointment with improvement in her pain and started on a heparin infusion and hospitalist consulted for admission Review of Systems: As per HPI otherwise 10 point review of systems negative.    Past Medical History:  Diagnosis Date  . Allergy   . Chicken pox   . Heart murmur   . Hypertension     History reviewed. No pertinent surgical history.   reports that she has never smoked. She has never used smokeless tobacco. She reports that she does not drink alcohol or use drugs.  No Known Allergies  Family History  Problem Relation Age of Onset  . Heart failure Mother   . Diabetes Mother   . Hypertension Mother   . Congestive Heart Failure Brother   . Hypertension Brother   . Cerebral palsy Son   . Congestive Heart  Failure Brother      Prior to Admission medications   Medication Sig Start Date End Date Taking? Authorizing Provider  amLODipine (NORVASC) 5 MG tablet Take 1 tablet (5 mg total) by mouth daily. 03/09/19  Yes Baity, Coralie Keens, NP  calcium carbonate (OS-CAL - DOSED IN MG OF ELEMENTAL CALCIUM) 1250 (500 Ca) MG tablet Take 1 tablet by mouth daily.   Yes [provider]  carvedilol (COREG) 25 MG tablet Take 1 tablet (25 mg total) by mouth 2 (two) times daily. 03/09/19  Yes Jearld Fenton, NP  fenofibrate 54 MG tablet Take 1 tablet (54 mg total) by mouth daily. 07/07/19  Yes Baity, Coralie Keens, NP  hydrochlorothiazide (HYDRODIURIL) 12.5 MG tablet Take 1 tablet (12.5 mg total) by mouth daily. 03/09/19  Yes Baity, Coralie Keens, NP  Omega-3 Fatty Acids (FISH OIL) 500 MG CAPS Take 500 mg by mouth daily.   Yes [provider]  potassium chloride SA (KLOR-CON M20) 20 MEQ tablet Take 1 tablet (20 mEq total) by mouth 2 (two) times daily. 03/09/19  Yes Jearld Fenton, NP  rosuvastatin (CRESTOR) 10 MG tablet Take 1 tablet (10 mg total) by mouth daily. 04/16/19  Yes Baity, Coralie Keens, NP  Vitamin D, Ergocalciferol, (DRISDOL) 1.25 MG (50000 UT) CAPS capsule Take 1 capsule (50,000 Units total) by mouth every 7 (seven) days. 04/16/19  Yes Jearld Fenton, NP    Physical Exam: Vitals:   08/09/19 0200 08/09/19 0230 08/09/19  0300 08/09/19 0330  BP: 130/73 126/72 132/71 124/70  Pulse: 77 77 76 72  Resp: (!) 23 18 (!) 23 (!) 23  Temp:      SpO2: 93% 97% 95% 97%  Weight:      Height:         Vitals:   08/09/19 0200 08/09/19 0230 08/09/19 0300 08/09/19 0330  BP: 130/73 126/72 132/71 124/70  Pulse: 77 77 76 72  Resp: (!) 23 18 (!) 23 (!) 23  Temp:      SpO2: 93% 97% 95% 97%  Weight:      Height:        Constitutional: NAD, alert and oriented x 3 Eyes: PERRL, lids and conjunctivae normal ENMT: Mucous membranes are moist.  Neck: normal, supple, no masses, no thyromegaly Respiratory: clear to  auscultation bilaterally, no wheezing, no crackles. Normal respiratory effort. No accessory muscle use.  Cardiovascular: Regular rate and rhythm, no murmurs / rubs / gallops. No extremity edema. 2+ pedal pulses. No carotid bruits.  Abdomen: no tenderness, no masses palpated. No hepatosplenomegaly. Bowel sounds positive.  Musculoskeletal: no clubbing / cyanosis. No joint deformity upper and lower extremities.  Skin: no rashes, lesions, ulcers.  Neurologic: No gross focal neurologic deficit. Psychiatric: Normal mood and affect.   Labs on Admission: I have personally reviewed following labs and imaging studies  CBC: Recent Labs  Lab 08/08/19 2310  WBC 14.3*  HGB 14.2  HCT 41.1  MCV 83.2  PLT 0000000*   Basic Metabolic Panel: Recent Labs  Lab 08/08/19 2310  NA 139  K 3.4*  CL 105  CO2 22  GLUCOSE 226*  BUN 14  CREATININE 0.77  CALCIUM 9.3   GFR: Estimated Creatinine Clearance: 57.4 mL/min (by C-G formula based on SCr of 0.77 mg/dL). Liver Function Tests: No results for input(s): AST, ALT, ALKPHOS, BILITOT, PROT, ALBUMIN in the last 168 hours. No results for input(s): LIPASE, AMYLASE in the last 168 hours. No results for input(s): AMMONIA in the last 168 hours. Coagulation Profile: Recent Labs  Lab 08/08/19 2310  INR 0.9   Cardiac Enzymes: No results for input(s): CKTOTAL, CKMB, CKMBINDEX, TROPONINI in the last 168 hours. BNP (last 3 results) No results for input(s): PROBNP in the last 8760 hours. HbA1C: No results for input(s): HGBA1C in the last 72 hours. CBG: No results for input(s): GLUCAP in the last 168 hours. Lipid Profile: No results for input(s): CHOL, HDL, LDLCALC, TRIG, CHOLHDL, LDLDIRECT in the last 72 hours. Thyroid Function Tests: No results for input(s): TSH, T4TOTAL, FREET4, T3FREE, THYROIDAB in the last 72 hours. Anemia Panel: No results for input(s): VITAMINB12, FOLATE, FERRITIN, TIBC, IRON, RETICCTPCT in the last 72 hours. Urine analysis:      Component Value Date/Time   COLORURINE YELLOW (A) 07/18/2017 1548   APPEARANCEUR CLEAR (A) 07/18/2017 1548   LABSPEC 1.012 07/18/2017 1548   PHURINE 6.0 07/18/2017 1548   GLUCOSEU NEGATIVE 07/18/2017 1548   HGBUR NEGATIVE 07/18/2017 1548   BILIRUBINUR NEGATIVE 07/18/2017 1548   KETONESUR NEGATIVE 07/18/2017 1548   PROTEINUR NEGATIVE 07/18/2017 1548   NITRITE NEGATIVE 07/18/2017 1548   LEUKOCYTESUR NEGATIVE 07/18/2017 1548    Radiological Exams on Admission: DG Chest 2 View  Result Date: 08/08/2019 CLINICAL DATA:  Chest pain EXAM: CHEST - 2 VIEW COMPARISON:  None. FINDINGS: The heart size and mediastinal contours are within normal limits. Both lungs are clear. Chronic appearing slight superior compression deformity of T6 through T9 vertebral bodies is seen. There is also anterior  compression deformity of T12 vertebral body with 50% loss of vertebral body height, slightly progressed since the prior CT. IMPRESSION: No active cardiopulmonary disease. Electronically Signed   By: Prudencio Pair M.D.   On: 08/08/2019 23:22    EKG: Independently reviewed.   Assessment/Plan Principal Problem:   NSTEMI (non-ST elevated myocardial infarction) (HCC) --Continue aspirin, statins, beta-blockers --Continue heparin infusion --Cardiology consult for further restratification --  atrial fibrillation --Currently on anticoagulation for uncertain reason --Continue rate control agents    Essential hypertension --Blood pressure control      DVT prophylaxis: Full dose heparin Code Status: DNR,  expresses she does not want to be resuscitated Disposition Plan: Back to previous home environment Consults called: Dr Cecelia Byars MD [facility]     08/09/2019, 4:27 AM

## 2019-08-09 NOTE — Progress Notes (Signed)
*  PRELIMINARY RESULTS* Echocardiogram 2D Echocardiogram has been performed.  Janice Liu Janice Liu 08/09/2019, 8:08 PM

## 2019-08-09 NOTE — Progress Notes (Signed)
PROGRESS NOTE    Janice Liu  [id number] DOB: 05/31/1941 DOA: 08/08/2019 PCP: Jearld Fenton, NP      Assessment & Plan:   Principal Problem:   NSTEMI (non-ST elevated myocardial infarction) [facility]) Active Problems:   Essential hypertension   NSTEMI: Continue aspirin, statin, carvedilol. Continue on IV heparin. Continue on tele. Will go for cardiac cath as per cardio. Cardio following and recs apprec   A. 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 Not on anticoagulation at home, question of possible fall risk. Continue on home dose of carvedilol. Continue on tele   Essential hypertension: continue on home dose of carvedilol & amlodpine    Leukocytosis: likely reactive. Will continue to monitor  Thrombocytosis: likely reactive. Will continue to monitor     DVT prophylaxis: IV heparin  Code Status: DNR Family Communication:  Disposition Plan:    Consultants:   cardio   Procedures: cardiac cath today    Antimicrobials: n/a    Subjective: Pt c/o malaise   Objective: Vitals:   08/09/19 1138 08/09/19 1143 08/09/19 1230 08/09/19 1258  BP: 108/68  111/73   Pulse:  80    Resp: 14 18 17    Temp:      SpO2:  96% 97% 96%  Weight:   74.8 kg   Height:   5\' 2"  (1.575 m)     Intake/Output Summary (Last 24 hours) at 08/09/2019 1349 Last data filed at 08/09/2019 1202 Gross per 24 hour  Intake 97.52 ml  Output --  Net 97.52 ml   Filed Weights   08/08/19 2257 08/09/19 1230  Weight: 74.8 kg 74.8 kg    Examination:  General exam: Appears calm and comfortable  Respiratory system: diminished breath sounds b/l otherwise clear. No rales Cardiovascular system: S1 & S2 normal. No , rubs, gallops or clicks.  Gastrointestinal system: Abdomen is nondistended, soft and nontender.. Normal bowel sounds heard. Central nervous system: Alert and oriented. Moves all 4 extremities. Psychiatry: Judgement and insight appear normal. Flat mood and affect     Data Reviewed: I have personally  reviewed following labs and imaging studies  CBC: Recent Labs  Lab 08/08/19 2310  WBC 14.3*  HGB 14.2  HCT 41.1  MCV 83.2  PLT 0000000*   Basic Metabolic Panel: Recent Labs  Lab 08/08/19 2310 08/09/19 0827  NA 139  --   K 3.4*  --   CL 105  --   CO2 22  --   GLUCOSE 226*  --   BUN 14  --   CREATININE 0.77  --   CALCIUM 9.3  --   MG  --  2.0   GFR: Estimated Creatinine Clearance: 54.9 mL/min (by C-G formula based on SCr of 0.77 mg/dL). Liver Function Tests: No results for input(s): AST, ALT, ALKPHOS, BILITOT, PROT, ALBUMIN in the last 168 hours. No results for input(s): LIPASE, AMYLASE in the last 168 hours. No results for input(s): AMMONIA in the last 168 hours. Coagulation Profile: Recent Labs  Lab 08/08/19 2310  INR 0.9   Cardiac Enzymes: No results for input(s): CKTOTAL, CKMB, CKMBINDEX, TROPONINI in the last 168 hours. BNP (last 3 results) No results for input(s): PROBNP in the last 8760 hours. HbA1C: No results for input(s): HGBA1C in the last 72 hours. CBG: Recent Labs  Lab 08/09/19 1244  GLUCAP 133*   Lipid Profile: Recent Labs    08/09/19 0827  CHOL 144  HDL 48  LDLCALC 60  TRIG 181*  CHOLHDL 3.0   Thyroid Function Tests:  Recent Labs    08/09/19 0827  TSH 1.892   Anemia Panel: No results for input(s): VITAMINB12, FOLATE, FERRITIN, TIBC, IRON, RETICCTPCT in the last 72 hours. Sepsis Labs: No results for input(s): PROCALCITON, LATICACIDVEN in the last 168 hours.  Recent Results (from the past 240 hour(s))  SARS CORONAVIRUS 2 (TAT 6-24 HRS) Nasopharyngeal Nasopharyngeal Swab     Status: None   Collection Time: 08/09/19 12:42 AM   Specimen: Nasopharyngeal Swab  Result Value Ref Range Status   SARS Coronavirus 2 NEGATIVE NEGATIVE Final    Comment: (NOTE) SARS-CoV-2 target nucleic acids are NOT DETECTED. The SARS-CoV-2 RNA is generally detectable in upper and lower respiratory specimens during the acute phase of infection.  Negative results do not preclude SARS-CoV-2 infection, do not rule out co-infections with other pathogens, and should not be used as the sole basis for treatment or other patient management decisions. Negative results must be combined with clinical observations, patient history, and epidemiological information. The expected result is Negative. Fact Sheet for Patients: SugarRoll.be Fact Sheet for Healthcare Providers: https://www.woods-mathews.com/ This test is not yet approved or cleared by the [place] FDA and  has been authorized for detection and/or diagnosis of SARS-CoV-2 by FDA under an Emergency Use Authorization (EUA). This EUA will remain  in effect (meaning this test can be used) for the duration of the COVID-19 declaration under Section 56 4(b)(1) of the Act, 21 U.S.C. section 360bbb-3(b)(1), unless the authorization is terminated or revoked sooner. Performed at [facility], [place] [address], [place], [place] [address]   Respiratory Panel by RT PCR (Flu A&B, Covid) - Nasopharyngeal Swab     Status: None   Collection Time: 08/09/19 10:07 AM   Specimen: Nasopharyngeal Swab  Result Value Ref Range Status   SARS Coronavirus 2 by RT PCR NEGATIVE NEGATIVE Final    Comment: (NOTE) SARS-CoV-2 target nucleic acids are NOT DETECTED. The SARS-CoV-2 RNA is generally detectable in upper respiratoy specimens during the acute phase of infection. The lowest concentration of SARS-CoV-2 viral copies this assay can detect is 131 copies/mL. A negative result does not preclude SARS-Cov-2 infection and should not be used as the sole basis for treatment or other patient management decisions. A negative result may occur with  improper specimen collection/handling, submission of specimen other than nasopharyngeal swab, presence of viral mutation(s) within the areas targeted by this assay, and inadequate number of viral copies (<131 copies/mL). A  negative result must be combined with clinical observations, patient history, and epidemiological information. The expected result is Negative. Fact Sheet for Patients:  PinkCheek.be Fact Sheet for Healthcare Providers:  GravelBags.it This test is not yet ap proved or cleared by the [place] FDA and  has been authorized for detection and/or diagnosis of SARS-CoV-2 by FDA under an Emergency Use Authorization (EUA). This EUA will remain  in effect (meaning this test can be used) for the duration of the COVID-19 declaration under Section 564(b)(1) of the Act, 21 U.S.C. section 360bbb-3(b)(1), unless the authorization is terminated or revoked sooner.    Influenza A by PCR NEGATIVE NEGATIVE Final   Influenza B by PCR NEGATIVE NEGATIVE Final    Comment: (NOTE) The Xpert Xpress SARS-CoV-2/FLU/RSV assay is intended as an aid in  the diagnosis of influenza from Nasopharyngeal swab specimens and  should not be used as a sole basis for treatment. Nasal washings and  aspirates are unacceptable for Xpert Xpress SARS-CoV-2/FLU/RSV  testing. Fact Sheet for Patients: PinkCheek.be Fact Sheet for Healthcare Providers:  GravelBags.it This test is not yet approved or cleared by the [place] and  has been authorized for detection and/or diagnosis of SARS-CoV-2 by  FDA under an Emergency Use Authorization (EUA). This EUA will remain  in effect (meaning this test can be used) for the duration of the  Covid-19 declaration under Section 564(b)(1) of the Act, 21  U.S.C. section 360bbb-3(b)(1), unless the authorization is  terminated or revoked. Performed at [facility], [address] [place], [place] [address]          Radiology Studies: DG Chest 2 View  Result Date: 08/08/2019 CLINICAL DATA:  Chest pain EXAM: CHEST - 2 VIEW COMPARISON:  None. FINDINGS: The  heart size and mediastinal contours are within normal limits. Both lungs are clear. Chronic appearing slight superior compression deformity of T6 through T9 vertebral bodies is seen. There is also anterior compression deformity of T12 vertebral body with 50% loss of vertebral body height, slightly progressed since the prior CT. IMPRESSION: No active cardiopulmonary disease. Electronically Signed   By: Prudencio Pair M.D.   On: 08/08/2019 23:22        Scheduled Meds: . [MAR Hold] amLODipine  5 mg Oral Daily  . [MAR Hold] aspirin  324 mg Oral NOW   Or  . [MAR Hold] aspirin  300 mg Rectal NOW  . [START ON 08/10/2019] aspirin  81 mg Oral Pre-Cath  . [MAR Hold] aspirin EC  81 mg Oral Daily  . [MAR Hold] carvedilol  25 mg Oral BID  . [MAR Hold] fenofibrate  54 mg Oral q1800  . [MAR Hold] heparin  4,000 Units Intravenous Once  . [MAR Hold] hydrochlorothiazide  12.5 mg Oral Daily  . [MAR Hold] potassium chloride  20 mEq Oral Once  . [MAR Hold] rosuvastatin  10 mg Oral q1800  . [MAR Hold] sodium chloride flush  3 mL Intravenous Q12H   Continuous Infusions: . sodium chloride    . [START ON 08/10/2019] sodium chloride 3 mL/kg/hr (08/09/19 1240)   Followed by  . [START ON 08/10/2019] sodium chloride    . [MAR Hold] heparin Stopped (08/09/19 1237)     LOS: 0 days    Time spent: 33 mins     Wyvonnia Dusky, MD [facility] Pager [phone]  If 7PM-7AM, please contact night-coverage [email or web address] Password [email or web address] 08/09/2019, 1:49 PM

## 2019-08-09 NOTE — ED Notes (Signed)
Patient resting quietly at this time.  Reports pain is better, rated 6/10.

## 2019-08-09 NOTE — ED Notes (Signed)
Patient resting quietly with eyes closed at this time.

## 2019-08-09 NOTE — Consult Note (Signed)
Cardiology Consultation:   Patient ID: Janice Liu MRN: [id number]; DOB: 1941-05-03  Admit date: 08/08/2019 Date of Consult: 08/09/2019  Primary Care Provider: Jearld Fenton, NP Primary Cardiologist: Janice Sacramento, MD  Primary Electrophysiologist:  None   Patient Profile:   Janice Liu is a 78 y.o. female with a hx of HTN, HLD, recently diagnosed DM2 (Hgb A1C 7.2), and patient reported family history of heart failure who is being seen today for the evaluation of NSTEMI at the request of Dr. Damita Liu.  History of Present Illness:   Janice Liu is a 78 yo female with history of HTN and HLD. She denies any past history of smoking.  She does not currently drink alcohol.  She reports a family history of heart failure but no family history of early cardiac death. She also reports a h/o atrial fibrillation; however, on review of her chart, she has never been seen for this in the past and is not on any anticoagulation or has any documentation to confirm this diagnosis. In December 2015, she underwent nuclear stress test that was ruled low risk.  In 11/28/16, her son passed away after a lifetime of cerebral palsy.  She reported increased stress at that time.  She has been followed by Janice Liu for HTN and last seen 08/19/2016 by her primary cardiologist and doing well from a cardiac standpoint.  During the evening of 08/08/2019, she reported chest pain that was described as centrally located and aching in quality, radiating to her back, lasting approximately 1-2 hours, and rated 9/10 in severity.  The pain was further described as nonpositional and nonpleuritic.  Associated symptoms included some shortness of breath nausea.  She denied any associated emesis, diarrhea, diaphoresis, feelings of racing heart rate, or palpitations.  No presyncope or syncope.  She had never experienced chest pain like this before in the past.  She reported improvement in the chest pain with receipt of nitro and upon arrival  in the emergency department.  Since that time, she has not had any recurrence of this chest pain. In the ED, labs significant for sodium 139, potassium 3.4, glucose 226, creatinine 0.77, BUN 14, WBC 14.3, hemoglobin 14.2, RBC 4.94, and HgbA1C 7.2.  COVID-19 results pending. EKG showed NSR, 74 bpm, ST upsloping changes noted in the lateral and anterior precordial leads but not meeting criteria. Initial troponin 1,140  >27,000. CXR without active cardiopulmonary dz.  Of note, patient is normally a DNR but willing to rescind this temporarily for cardiac catheterization. Also of note, the patient reports staying home safe over the last year and without any known exposure to COVID-19.  No current CP.  Heart Pathway Score:     Past Medical History:  Diagnosis Date  . Allergy   . Chicken pox   . Heart murmur   . Hypertension     History reviewed. No pertinent surgical history.   Home Medications:  Prior to Admission medications   Medication Sig Start Date End Date Taking? Authorizing Provider  amLODipine (NORVASC) 5 MG tablet Take 1 tablet (5 mg total) by mouth daily. 03/09/19  Yes Liu, Janice Keens, NP  calcium carbonate (OS-CAL - DOSED IN MG OF ELEMENTAL CALCIUM) 1250 (500 Ca) MG tablet Take 1 tablet by mouth daily.   Yes [provider]  carvedilol (COREG) 25 MG tablet Take 1 tablet (25 mg total) by mouth 2 (two) times daily. 03/09/19  Yes Janice Fenton, NP  fenofibrate 54 MG tablet Take 1 tablet (54 mg  total) by mouth daily. 07/07/19  Yes Liu, Janice Keens, NP  hydrochlorothiazide (HYDRODIURIL) 12.5 MG tablet Take 1 tablet (12.5 mg total) by mouth daily. 03/09/19  Yes Liu, Janice Keens, NP  Omega-3 Fatty Acids (FISH OIL) 500 MG CAPS Take 500 mg by mouth daily.   Yes [provider]  potassium chloride SA (KLOR-CON M20) 20 MEQ tablet Take 1 tablet (20 mEq total) by mouth 2 (two) times daily. 03/09/19  Yes Janice Fenton, NP  rosuvastatin (CRESTOR) 10 MG tablet Take 1 tablet (10 mg  total) by mouth daily. 04/16/19  Yes Liu, Janice Keens, NP  Vitamin D, Ergocalciferol, (DRISDOL) 1.25 MG (50000 UT) CAPS capsule Take 1 capsule (50,000 Units total) by mouth every 7 (seven) days. 04/16/19  Yes Janice Fenton, NP    Inpatient Medications: Scheduled Meds: . amLODipine  5 mg Oral Daily  . aspirin  324 mg Oral NOW   Or  . aspirin  300 mg Rectal NOW  . [START ON 08/10/2019] aspirin EC  81 mg Oral Daily  . carvedilol  25 mg Oral BID  . fenofibrate  54 mg Oral q1800  . heparin  4,000 Units Intravenous Once  . hydrochlorothiazide  12.5 mg Oral Daily  . potassium chloride  20 mEq Oral Once  . rosuvastatin  10 mg Oral q1800   Continuous Infusions: . heparin 900 Units/hr (08/09/19 0109)   PRN Meds: acetaminophen, nitroGLYCERIN, ondansetron (ZOFRAN) IV  Allergies:   No Known Allergies  Social History:   Social History   Socioeconomic History  . Marital status: Widowed    Spouse name: Not on file  . Number of children: Not on file  . Years of education: Not on file  . Highest education level: Not on file  Occupational History  . Not on file  Tobacco Use  . Smoking status: Never Smoker  . Smokeless tobacco: Never Used  Substance and Sexual Activity  . Alcohol use: No    Alcohol/week: 0.0 standard drinks  . Drug use: No  . Sexual activity: Never  Other Topics Concern  . Not on file  Social History Narrative  . Not on file   Social Determinants of Health   Financial Resource Strain:   . Difficulty of Paying Living Expenses: Not on file  Food Insecurity:   . Worried About Charity fundraiser in the Last Year: Not on file  . Ran Out of Food in the Last Year: Not on file  Transportation Needs:   . Lack of Transportation (Medical): Not on file  . Lack of Transportation (Non-Medical): Not on file  Physical Activity:   . Days of Exercise per Week: Not on file  . Minutes of Exercise per Session: Not on file  Stress:   . Feeling of Stress : Not on file  Social  Connections:   . Frequency of Communication with Friends and Family: Not on file  . Frequency of Social Gatherings with Friends and Family: Not on file  . Attends Religious Services: Not on file  . Active Member of Clubs or Organizations: Not on file  . Attends Archivist Meetings: Not on file  . Marital Status: Not on file  Intimate Partner Violence:   . Fear of Current or Ex-Partner: Not on file  . Emotionally Abused: Not on file  . Physically Abused: Not on file  . Sexually Abused: Not on file    Family History:    Family History  Problem Relation Age of  Onset  . Heart failure Mother   . Diabetes Mother   . Hypertension Mother   . Congestive Heart Failure Brother   . Hypertension Brother   . Cerebral palsy Son   . Congestive Heart Failure Brother      ROS:  Please see the history of present illness.  Review of Systems  Constitutional: Negative for chills, diaphoresis, fever, malaise/fatigue and weight loss.  Respiratory: Positive for shortness of breath. Negative for cough, hemoptysis and wheezing.   Cardiovascular: Positive for chest pain. Negative for palpitations, orthopnea and leg swelling.  Gastrointestinal: Positive for nausea. Negative for abdominal pain, blood in stool, constipation, diarrhea, melena and vomiting.  Genitourinary: Negative for dysuria.  Musculoskeletal: Negative for falls.  Neurological: Negative for dizziness, loss of consciousness and weakness.  All other systems reviewed and are negative.   All other ROS reviewed and negative.     Physical Exam/Data:   Vitals:   08/09/19 0800 08/09/19 0815 08/09/19 0830 08/09/19 0845  BP: 138/70  140/81   Pulse: 71 78 71 73  Resp: 17 20 (!) 21 18  Temp:      SpO2: 95% 99% 97% 97%  Weight:      Height:       No intake or output data in the 24 hours ending 08/09/19 1010 Last 3 Weights 08/08/2019 03/09/2019 03/02/2018  Weight (lbs) 165 lb 163 lb 165 lb  Weight (kg) 74.844 kg 73.936 kg 74.844 kg      Body mass index is 28.32 kg/m.  General:  Elderly  Female, no acute distress, mask in place HEENT: normal Neck: no JVD Vascular: No carotid bruits; radial pulses 2+ bilaterally Cardiac:  normal S1, S2; RRR; no murmur   Lungs:  clear to auscultation bilaterally, no wheezing, rhonchi or rales  Abd: soft, nontender, no hepatomegaly  Ext: no edema Musculoskeletal:  No deformities, BUE and BLE strength normal and equal Skin: warm and dry  Neuro:  No focal abnormalities noted Psych:  Normal affect   EKG:  The EKG was personally reviewed and demonstrates: NSR, 74 bpm, ST upsloping changes noted in the lateral and anterior precordial leads but not meeting criteria.  Telemetry:  Telemetry was personally reviewed and demonstrates:  NSR, PVCs, NSVT of 4beats, 70s  Relevant CV Studies:   Myoview 07/20/2014 Per result note, normal study   Laboratory Data:  High Sensitivity Troponin:   Recent Labs  Lab 08/08/19 2310 08/09/19 0827  TROPONINIHS 1,140* >27,000*     Cardiac EnzymesNo results for input(s): TROPONINI in the last 168 hours. No results for input(s): TROPIPOC in the last 168 hours.  Chemistry Recent Labs  Lab 08/08/19 2310  NA 139  K 3.4*  CL 105  CO2 22  GLUCOSE 226*  BUN 14  CREATININE 0.77  CALCIUM 9.3  GFRNONAA >60  GFRAA >60  ANIONGAP 12    No results for input(s): PROT, ALBUMIN, AST, ALT, ALKPHOS, BILITOT in the last 168 hours. Hematology Recent Labs  Lab 08/08/19 2310  WBC 14.3*  RBC 4.94  HGB 14.2  HCT 41.1  MCV 83.2  MCH 28.7  MCHC 34.5  RDW 13.2  PLT 420*   BNPNo results for input(s): BNP, PROBNP in the last 168 hours.  DDimer No results for input(s): DDIMER in the last 168 hours.   Radiology/Studies:  DG Chest 2 View  Result Date: 08/08/2019 CLINICAL DATA:  Chest pain EXAM: CHEST - 2 VIEW COMPARISON:  None. FINDINGS: The heart size and mediastinal contours are within  normal limits. Both lungs are clear. Chronic appearing slight  superior compression deformity of T6 through T9 vertebral bodies is seen. There is also anterior compression deformity of T12 vertebral body with 50% loss of vertebral body height, slightly progressed since the prior CT. IMPRESSION: No active cardiopulmonary disease. Electronically Signed   By: Prudencio Pair M.D.   On: 08/08/2019 23:22    Assessment and Plan:   NSTEMI --No current CP with report of chest pain 08/08/2019 that was rated 9/10 in severity and started at rest without any clear triggers.  She has never had pain like this before in the past.  Risk factors for cardiac etiology include age, hypertension, hyperlipidemia, DM2, and family history of heart disease/CAD. She does not drink and has never smoked tobacco. Further labs for risk stratification pending.  High-sensitivity troponin 1140  >27,000.  EKG with upsloping ST changes in the anterolateral leads but not meeting criteria for STEMI. --Pending results of COVID-19 test, scheduled for LHC today 12/28 given the above risk factors and CP story concerning for cardiac etiology. HS Tn elevation consistent with ACS and EKG showing changes from that of previous. Of note, as above, patient temporarily rescinded DNR for cath after phone discussion between son and cardiologist today in the ED. Risks and benefits of cardiac catheterization have been discussed with the patient.  These include bleeding, infection, kidney damage, stroke, heart attack, death.  The patient understands these risks and is willing to proceed. Keep NPO in preparation for cath. Continue on IV heparin gtt.  Continue ASA 81mg  daily, BB with Coreg, statin, PRN SL nitro and nitro paste for CP. Replete potassium as below. Plan for echocardiogram after LHC. Further recommendations at that time and including possible addition of ACE/ARB/ARNI/spiro following cath / echo.  Hypokalemia --Replete with goal 4.0. Labs pending for Mg with goal 2.0. Daily BMET.  HTN --BP sub-optimal 140/68.  Continue to titrate medications as needed for BP and HR control. Continue PTA medications include amlodipine 5 mg daily, Coreg 25 mg twice daily, hydrochlorothiazide 12.5 mg daily. Of note, patient may need discharged home with daily potassium on hydrochlorothiazide given potassium at presentation as above.  HLD --Updated lipid panel pending.  His LDL 184 in 2017.  Continue statin.  DM2 --Newly diagnosed this admission. Hgb A1C 7.2. Per IM.   For questions or updates, please contact [place] Please consult [email or web address] for contact info under     Signed, Arvil Chaco, PA-C  08/09/2019 10:10 AM

## 2019-08-09 NOTE — Progress Notes (Signed)
ANTICOAGULATION CONSULT NOTE - Initial Consult  Pharmacy Consult for Heparin Indication: chest pain/ACS  No Known Allergies  Patient Measurements: Height: 5\' 4"  (162.6 cm) Weight: 165 lb (74.8 kg) IBW/kg (Calculated) : 54.7 HEPARIN DW (KG): 70.3  Vital Signs: Temp: 98 F (36.7 C) (12/27 2257) BP: 140/68 (12/27 2257) Pulse Rate: 80 (12/27 2257)  Labs: Recent Labs    08/08/19 2310  HGB 14.2  HCT 41.1  PLT 420*  CREATININE 0.77  TROPONINIHS 1,140*    Estimated Creatinine Clearance: 57.4 mL/min (by C-G formula based on SCr of 0.77 mg/dL).  Medical History: Past Medical History:  Diagnosis Date  . Allergy   . Chicken pox   . Heart murmur   . Hypertension     Medications:  (Not in a hospital admission)   Assessment: Pharmacy asked to initiate and monitor Heparin for ACS.  Baseline labs ordered.  No anticoagulants PTA per current med list.   Goal of Therapy:  Heparin level 0.3-0.7 units/ml Monitor platelets by anticoagulation protocol: Yes   Plan:  Heparin 4000 units bolus x 1 then infusion at 900 units/hr Check HL in 8 hrs  [place] Crane, Aixa Corsello A 08/09/2019,12:25 AM

## 2019-08-09 NOTE — H&P (View-Only) (Signed)
Cardiology Consultation:   Patient ID: Janice Liu MRN: [id number]; DOB: 01-Jul-1941  Admit date: 08/08/2019 Date of Consult: 08/09/2019  Primary Care Provider: Jearld Fenton, NP Primary Cardiologist: Kathlyn Sacramento, MD  Primary Electrophysiologist:  None   Patient Profile:   Janice Liu is a 78 y.o. female with a hx of HTN, HLD, recently diagnosed DM2 (Hgb A1C 7.2), and patient reported family history of heart failure who is being seen today for the evaluation of NSTEMI at the request of Dr. Damita Dunnings.  History of Present Illness:   Janice Liu is a 78 yo female with history of HTN and HLD. She denies any past history of smoking.  She does not currently drink alcohol.  She reports a family history of heart failure but no family history of early cardiac death. She also reports a h/o atrial fibrillation; however, on review of her chart, she has never been seen for this in the past and is not on any anticoagulation or has any documentation to confirm this diagnosis. In December 2015, she underwent nuclear stress test that was ruled low risk.  In 11/28/16, her son passed away after a lifetime of cerebral palsy.  She reported increased stress at that time.  She has been followed by [facility] for HTN and last seen 08/19/2016 by her primary cardiologist and doing well from a cardiac standpoint.  During the evening of 08/08/2019, she reported chest pain that was described as centrally located and aching in quality, radiating to her back, lasting approximately 1-2 hours, and rated 9/10 in severity.  The pain was further described as nonpositional and nonpleuritic.  Associated symptoms included some shortness of breath nausea.  She denied any associated emesis, diarrhea, diaphoresis, feelings of racing heart rate, or palpitations.  No presyncope or syncope.  She had never experienced chest pain like this before in the past.  She reported improvement in the chest pain with receipt of nitro and upon arrival  in the emergency department.  Since that time, she has not had any recurrence of this chest pain. In the ED, labs significant for sodium 139, potassium 3.4, glucose 226, creatinine 0.77, BUN 14, WBC 14.3, hemoglobin 14.2, RBC 4.94, and HgbA1C 7.2.  COVID-19 results pending. EKG showed NSR, 74 bpm, ST upsloping changes noted in the lateral and anterior precordial leads but not meeting criteria. Initial troponin 1,140  >27,000. CXR without active cardiopulmonary dz.  Of note, patient is normally a DNR but willing to rescind this temporarily for cardiac catheterization. Also of note, the patient reports staying home safe over the last year and without any known exposure to COVID-19.  No current CP.  Heart Pathway Score:     Past Medical History:  Diagnosis Date  . Allergy   . Chicken pox   . Heart murmur   . Hypertension     History reviewed. No pertinent surgical history.   Home Medications:  Prior to Admission medications   Medication Sig Start Date End Date Taking? Authorizing Provider  amLODipine (NORVASC) 5 MG tablet Take 1 tablet (5 mg total) by mouth daily. 03/09/19  Yes Baity, Coralie Keens, NP  calcium carbonate (OS-CAL - DOSED IN MG OF ELEMENTAL CALCIUM) 1250 (500 Ca) MG tablet Take 1 tablet by mouth daily.   Yes [provider]  carvedilol (COREG) 25 MG tablet Take 1 tablet (25 mg total) by mouth 2 (two) times daily. 03/09/19  Yes Jearld Fenton, NP  fenofibrate 54 MG tablet Take 1 tablet (54 mg  total) by mouth daily. 07/07/19  Yes Baity, Coralie Keens, NP  hydrochlorothiazide (HYDRODIURIL) 12.5 MG tablet Take 1 tablet (12.5 mg total) by mouth daily. 03/09/19  Yes Baity, Coralie Keens, NP  Omega-3 Fatty Acids (FISH OIL) 500 MG CAPS Take 500 mg by mouth daily.   Yes [provider]  potassium chloride SA (KLOR-CON M20) 20 MEQ tablet Take 1 tablet (20 mEq total) by mouth 2 (two) times daily. 03/09/19  Yes Jearld Fenton, NP  rosuvastatin (CRESTOR) 10 MG tablet Take 1 tablet (10 mg  total) by mouth daily. 04/16/19  Yes Baity, Coralie Keens, NP  Vitamin D, Ergocalciferol, (DRISDOL) 1.25 MG (50000 UT) CAPS capsule Take 1 capsule (50,000 Units total) by mouth every 7 (seven) days. 04/16/19  Yes Jearld Fenton, NP    Inpatient Medications: Scheduled Meds: . amLODipine  5 mg Oral Daily  . aspirin  324 mg Oral NOW   Or  . aspirin  300 mg Rectal NOW  . [START ON 08/10/2019] aspirin EC  81 mg Oral Daily  . carvedilol  25 mg Oral BID  . fenofibrate  54 mg Oral q1800  . heparin  4,000 Units Intravenous Once  . hydrochlorothiazide  12.5 mg Oral Daily  . potassium chloride  20 mEq Oral Once  . rosuvastatin  10 mg Oral q1800   Continuous Infusions: . heparin 900 Units/hr (08/09/19 0109)   PRN Meds: acetaminophen, nitroGLYCERIN, ondansetron (ZOFRAN) IV  Allergies:   No Known Allergies  Social History:   Social History   Socioeconomic History  . Marital status: Widowed    Spouse name: Not on file  . Number of children: Not on file  . Years of education: Not on file  . Highest education level: Not on file  Occupational History  . Not on file  Tobacco Use  . Smoking status: Never Smoker  . Smokeless tobacco: Never Used  Substance and Sexual Activity  . Alcohol use: No    Alcohol/week: 0.0 standard drinks  . Drug use: No  . Sexual activity: Never  Other Topics Concern  . Not on file  Social History Narrative  . Not on file   Social Determinants of Health   Financial Resource Strain:   . Difficulty of Paying Living Expenses: Not on file  Food Insecurity:   . Worried About Charity fundraiser in the Last Year: Not on file  . Ran Out of Food in the Last Year: Not on file  Transportation Needs:   . Lack of Transportation (Medical): Not on file  . Lack of Transportation (Non-Medical): Not on file  Physical Activity:   . Days of Exercise per Week: Not on file  . Minutes of Exercise per Session: Not on file  Stress:   . Feeling of Stress : Not on file  Social  Connections:   . Frequency of Communication with Friends and Family: Not on file  . Frequency of Social Gatherings with Friends and Family: Not on file  . Attends Religious Services: Not on file  . Active Member of Clubs or Organizations: Not on file  . Attends Archivist Meetings: Not on file  . Marital Status: Not on file  Intimate Partner Violence:   . Fear of Current or Ex-Partner: Not on file  . Emotionally Abused: Not on file  . Physically Abused: Not on file  . Sexually Abused: Not on file    Family History:    Family History  Problem Relation Age of  Onset  . Heart failure Mother   . Diabetes Mother   . Hypertension Mother   . Congestive Heart Failure Brother   . Hypertension Brother   . Cerebral palsy Son   . Congestive Heart Failure Brother      ROS:  Please see the history of present illness.  Review of Systems  Constitutional: Negative for chills, diaphoresis, fever, malaise/fatigue and weight loss.  Respiratory: Positive for shortness of breath. Negative for cough, hemoptysis and wheezing.   Cardiovascular: Positive for chest pain. Negative for palpitations, orthopnea and leg swelling.  Gastrointestinal: Positive for nausea. Negative for abdominal pain, blood in stool, constipation, diarrhea, melena and vomiting.  Genitourinary: Negative for dysuria.  Musculoskeletal: Negative for falls.  Neurological: Negative for dizziness, loss of consciousness and weakness.  All other systems reviewed and are negative.   All other ROS reviewed and negative.     Physical Exam/Data:   Vitals:   08/09/19 0800 08/09/19 0815 08/09/19 0830 08/09/19 0845  BP: 138/70  140/81   Pulse: 71 78 71 73  Resp: 17 20 (!) 21 18  Temp:      SpO2: 95% 99% 97% 97%  Weight:      Height:       No intake or output data in the 24 hours ending 08/09/19 1010 Last 3 Weights 08/08/2019 03/09/2019 03/02/2018  Weight (lbs) 165 lb 163 lb 165 lb  Weight (kg) 74.844 kg 73.936 kg 74.844 kg      Body mass index is 28.32 kg/m.  General:  Elderly  Female, no acute distress, mask in place HEENT: normal Neck: no JVD Vascular: No carotid bruits; radial pulses 2+ bilaterally Cardiac:  normal S1, S2; RRR; no murmur   Lungs:  clear to auscultation bilaterally, no wheezing, rhonchi or rales  Abd: soft, nontender, no hepatomegaly  Ext: no edema Musculoskeletal:  No deformities, BUE and BLE strength normal and equal Skin: warm and dry  Neuro:  No focal abnormalities noted Psych:  Normal affect   EKG:  The EKG was personally reviewed and demonstrates: NSR, 74 bpm, ST upsloping changes noted in the lateral and anterior precordial leads but not meeting criteria.  Telemetry:  Telemetry was personally reviewed and demonstrates:  NSR, PVCs, NSVT of 4beats, 70s  Relevant CV Studies:   Myoview 07/20/2014 Per result note, normal study   Laboratory Data:  High Sensitivity Troponin:   Recent Labs  Lab 08/08/19 2310 08/09/19 0827  TROPONINIHS 1,140* >27,000*     Cardiac EnzymesNo results for input(s): TROPONINI in the last 168 hours. No results for input(s): TROPIPOC in the last 168 hours.  Chemistry Recent Labs  Lab 08/08/19 2310  NA 139  K 3.4*  CL 105  CO2 22  GLUCOSE 226*  BUN 14  CREATININE 0.77  CALCIUM 9.3  GFRNONAA >60  GFRAA >60  ANIONGAP 12    No results for input(s): PROT, ALBUMIN, AST, ALT, ALKPHOS, BILITOT in the last 168 hours. Hematology Recent Labs  Lab 08/08/19 2310  WBC 14.3*  RBC 4.94  HGB 14.2  HCT 41.1  MCV 83.2  MCH 28.7  MCHC 34.5  RDW 13.2  PLT 420*   BNPNo results for input(s): BNP, PROBNP in the last 168 hours.  DDimer No results for input(s): DDIMER in the last 168 hours.   Radiology/Studies:  DG Chest 2 View  Result Date: 08/08/2019 CLINICAL DATA:  Chest pain EXAM: CHEST - 2 VIEW COMPARISON:  None. FINDINGS: The heart size and mediastinal contours are within  normal limits. Both lungs are clear. Chronic appearing slight  superior compression deformity of T6 through T9 vertebral bodies is seen. There is also anterior compression deformity of T12 vertebral body with 50% loss of vertebral body height, slightly progressed since the prior CT. IMPRESSION: No active cardiopulmonary disease. Electronically Signed   By: Prudencio Pair M.D.   On: 08/08/2019 23:22    Assessment and Plan:   NSTEMI --No current CP with report of chest pain 08/08/2019 that was rated 9/10 in severity and started at rest without any clear triggers.  She has never had pain like this before in the past.  Risk factors for cardiac etiology include age, hypertension, hyperlipidemia, DM2, and family history of heart disease/CAD. She does not drink and has never smoked tobacco. Further labs for risk stratification pending.  High-sensitivity troponin 1140  >27,000.  EKG with upsloping ST changes in the anterolateral leads but not meeting criteria for STEMI. --Pending results of COVID-19 test, scheduled for LHC today 12/28 given the above risk factors and CP story concerning for cardiac etiology. HS Tn elevation consistent with ACS and EKG showing changes from that of previous. Of note, as above, patient temporarily rescinded DNR for cath after phone discussion between son and cardiologist today in the ED. Risks and benefits of cardiac catheterization have been discussed with the patient.  These include bleeding, infection, kidney damage, stroke, heart attack, death.  The patient understands these risks and is willing to proceed. Keep NPO in preparation for cath. Continue on IV heparin gtt.  Continue ASA 81mg  daily, BB with Coreg, statin, PRN SL nitro and nitro paste for CP. Replete potassium as below. Plan for echocardiogram after LHC. Further recommendations at that time and including possible addition of ACE/ARB/ARNI/spiro following cath / echo.  Hypokalemia --Replete with goal 4.0. Labs pending for Mg with goal 2.0. Daily BMET.  HTN --BP sub-optimal 140/68.  Continue to titrate medications as needed for BP and HR control. Continue PTA medications include amlodipine 5 mg daily, Coreg 25 mg twice daily, hydrochlorothiazide 12.5 mg daily. Of note, patient may need discharged home with daily potassium on hydrochlorothiazide given potassium at presentation as above.  HLD --Updated lipid panel pending.  His LDL 184 in 2017.  Continue statin.  DM2 --Newly diagnosed this admission. Hgb A1C 7.2. Per IM.   For questions or updates, please contact [place] Please consult [email or web address] for contact info under     Signed, Arvil Chaco, PA-C  08/09/2019 10:10 AM

## 2019-08-09 NOTE — ED Notes (Signed)
Up to bathroom with assistance.

## 2019-08-09 NOTE — Progress Notes (Signed)
ANTICOAGULATION CONSULT NOTE   Pharmacy Consult for Heparin Indication: chest pain/ACS  No Known Allergies  Patient Measurements: Height: 5\' 4"  (162.6 cm) Weight: 165 lb (74.8 kg) IBW/kg (Calculated) : 54.7 HEPARIN DW (KG): 70.3  Vital Signs: Temp: 98 F (36.7 C) (12/27 2257) BP: 140/81 (12/28 0830) Pulse Rate: 73 (12/28 0845)  Labs: Recent Labs    08/08/19 2310 08/09/19 0827  HGB 14.2  --   HCT 41.1  --   PLT 420*  --   APTT 30  --   LABPROT 12.1  --   INR 0.9  --   HEPARINUNFRC  --  0.49  CREATININE 0.77  --   TROPONINIHS 1,140* >27,000*    Estimated Creatinine Clearance: 57.4 mL/min (by C-G formula based on SCr of 0.77 mg/dL).  Medical History: Past Medical History:  Diagnosis Date  . Allergy   . Chicken pox   . Heart murmur   . Hypertension     Medications:  (Not in a hospital admission)   Assessment: Pharmacy asked to initiate and monitor Heparin for ACS.  Baseline labs ordered.  No anticoagulants PTA per current med list.   12/28 @0827  HL 0.49. Collected 7.5 hours after starting. Therapeutic. Per MAR, heparin bolus was not given; unclear reason. Called and spoke to [place] (RN in ED) and information about the heparin bolus was not passed along to him. Per [facility], he said he would f/u night nurse.    Goal of Therapy:  Heparin level 0.3-0.7 units/ml Monitor platelets by anticoagulation protocol: Yes   Plan:  Given the HL is therapeutic, it is therapeutic at the current rate. Continue heparin rate of 900 units/hr. Check HL in 8 hrs and CBC with AM labs.   Rowland Lathe 08/09/2019,9:34 AM

## 2019-08-10 ENCOUNTER — Other Ambulatory Visit: Payer: Self-pay | Admitting: Internal Medicine

## 2019-08-10 ENCOUNTER — Telehealth: Payer: Self-pay | Admitting: Internal Medicine

## 2019-08-10 DIAGNOSIS — E559 Vitamin D deficiency, unspecified: Secondary | ICD-10-CM

## 2019-08-10 DIAGNOSIS — I255 Ischemic cardiomyopathy: Secondary | ICD-10-CM

## 2019-08-10 DIAGNOSIS — E876 Hypokalemia: Secondary | ICD-10-CM

## 2019-08-10 LAB — GLUCOSE, CAPILLARY
Glucose-Capillary: 111 mg/dL — ABNORMAL HIGH (ref 70–99)
Glucose-Capillary: 129 mg/dL — ABNORMAL HIGH (ref 70–99)
Glucose-Capillary: 112 mg/dL — ABNORMAL HIGH (ref 70–99)

## 2019-08-10 LAB — CBC
HCT: 35.9 % — ABNORMAL LOW (ref 36.0–46.0)
Hemoglobin: 12.2 g/dL (ref 12.0–15.0)
MCH: 28.4 pg (ref 26.0–34.0)
MCHC: 34 g/dL (ref 30.0–36.0)
MCV: 83.7 fL (ref 80.0–100.0)
Platelets: 350 10*3/uL (ref 150–400)
RBC: 4.29 MIL/uL (ref 3.87–5.11)
RDW: 13.4 % (ref 11.5–15.5)
WBC: 11.1 10*3/uL — ABNORMAL HIGH (ref 4.0–10.5)
nRBC: 0 % (ref 0.0–0.2)

## 2019-08-10 LAB — BASIC METABOLIC PANEL
Anion gap: 10 (ref 5–15)
BUN: 9 mg/dL (ref 8–23)
CO2: 23 mmol/L (ref 22–32)
Calcium: 8.6 mg/dL — ABNORMAL LOW (ref 8.9–10.3)
Chloride: 107 mmol/L (ref 98–111)
Creatinine, Ser: 0.75 mg/dL (ref 0.44–1.00)
GFR calc Af Amer: >60 mL/min (ref >60)
GFR calc non Af Amer: >60 mL/min (ref >60)
Glucose, Bld: 114 mg/dL — ABNORMAL HIGH (ref 70–99)
Potassium: 3 mmol/L — ABNORMAL LOW (ref 3.5–5.1)
Sodium: 140 mmol/L (ref 135–145)

## 2019-08-10 LAB — LIPID PANEL
Cholesterol: 114 mg/dL (ref 0–200)
HDL: 41 mg/dL (ref >40)
LDL Cholesterol: 38 mg/dL (ref 0–99)
Total CHOL/HDL Ratio: 2.8 RATIO
Triglycerides: 175 mg/dL — ABNORMAL HIGH (ref <150)
VLDL: 35 mg/dL (ref 0–40)

## 2019-08-10 LAB — ECHOCARDIOGRAM COMPLETE
Height: 64 in
Weight: 2567.92 oz

## 2019-08-10 LAB — POTASSIUM: Potassium: 3.9 mmol/L (ref 3.5–5.1)

## 2019-08-10 MED ORDER — POTASSIUM CHLORIDE CRYS ER 20 MEQ PO TBCR
40.0000 meq | EXTENDED_RELEASE_TABLET | ORAL | Status: AC
  Administered 2019-08-10 (×2): 40 meq via ORAL
  Filled 2019-08-10 (×2): qty 2

## 2019-08-10 MED ORDER — SPIRONOLACTONE 25 MG PO TABS: 25.0000 mg | ORAL_TABLET | Freq: Every day | ORAL | 0 refills | Status: DC

## 2019-08-10 MED ORDER — TICAGRELOR 90 MG PO TABS: 90.0000 mg | ORAL_TABLET | Freq: Two times a day (BID) | ORAL | 0 refills | Status: DC

## 2019-08-10 MED ORDER — SPIRONOLACTONE 25 MG PO TABS
25.0000 mg | ORAL_TABLET | Freq: Every day | ORAL | Status: DC
  Administered 2019-08-10: 25 mg via ORAL
  Filled 2019-08-10: qty 1

## 2019-08-10 MED ORDER — ASPIRIN 81 MG PO TBEC: 81.0000 mg | DELAYED_RELEASE_TABLET | Freq: Every day | ORAL | 0 refills | Status: DC

## 2019-08-10 NOTE — Progress Notes (Signed)
Discharge instructions reviewed with patient. IV removed with no complications. Patient awaiting ride at this time.

## 2019-08-10 NOTE — Telephone Encounter (Signed)
BMET order entered.  Patient currently admitted at this time. Will reassess tomorrow concerning TCM call.

## 2019-08-10 NOTE — TOC Benefit Eligibility Note (Signed)
Transition of Care [facility]) Benefit Eligibility Note    Patient Details  Name: Janice Liu MRN: [id number] Date of Birth: 08-May-1941   Medication/Dose: Kary Kos 90mg  PO BID  Covered?: Yes  Prescription Coverage Preferred Pharmacy: [facility]  Spoke with Person/Company/Phone Number:: Deanna with [employer] at [phone]  Co-Pay: $100 estimated copay for 30 day supply retail  Prior Approval: No  Deductible: (In gap stage.)   Dannette Barbara Phone Number: [phone] or [phone] 08/10/2019, 4:51 PM

## 2019-08-10 NOTE — Discharge Summary (Signed)
Physician Discharge Summary  Janice Liu [id number] DOB: [birth year] DOA: 08/08/2019  PCP: Jearld Fenton, NP  Admit date: 08/08/2019 Discharge date: 08/10/2019  Admitted From: home Disposition:  home  Recommendations for Outpatient Follow-up:  1. Follow up with PCP in 1-2 weeks 2.   F/u cardio (Dr. Saunders Revel) in 1-2 weeks  Home Health: no Equipment/Devices: n/a  Discharge Condition: stable CODE STATUS: DNR Diet recommendation: Heart Healthy    Brief/Interim Summary: HPI was taken by Dr. Damita Dunnings:  Janice Liu is a 78 y.o. female with medical history significant for hypertension and atrial fibrillation not on anticoagulation who was in her usual state of health until couple hours prior to presentation when she started experiencing left-sided chest pressure described as an ache moderate intensity and radiating to the back.  Was associated with palpitations but she denies shortness of breath nausea vomiting or diaphoresis.  Denies cough fever abdominal pain or change in bowel habits.  She states she took 4 baby aspirin but did not experience relief in the pain   ED Course: On arrival to the emergency room vitals were stable with blood pressure 140/68 heart rate 80 O2 sat 98% on room air and she was afebrile.  EKG showed normal sinus rhythm with nonspecific ST-T wave changes.  Janice Liu was elevated at 1140 other labs were unremarkable.  Chest x-ray showed no acute findings.  Patient was placed on nitroglycerin ointment with improvement in her pain and started on a heparin infusion and hospitalist consulted for admission.  Hospital Course from Dr. Jimmye Norman 12/28-12/29/20: Pt was taken to cardiac cath lab and was found to have high-grade stenosis of the mid LAD as well as noncritical disease more proximally in the LAD as well as in the RCA.  The patient underwent successful PCI to the mid LAD. Pt was put on aspirin, brilinta, coreg, spironolactone, statin & fenofibrate as per cardio. Pt did  not require PT or OT as pt was ambulating independently. Pt will f/u w/ cardio (Dr. Saunders Revel) in 1-2 weeks as an outpatient.    Discharge Diagnoses:  Principal Problem:   NSTEMI (non-ST elevated myocardial infarction) [facility]) Active Problems:   Essential hypertension   Ischemic cardiomyopathy  NSTEMI: Continue aspirin, brilinta, statin, carvedilol, & spironolactone.  IV heparin d/c. Continue on tele. S/p cardiac cath which showed high-grade stenosis of the mid LAD as well as noncritical disease more proximally in the LAD as well as in the RCA.  The patient underwent successful PCI to the mid LAD . Cardio following and recs apprec   A. 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 Not on anticoagulation at home, question of possible fall risk. Continue on home dose of carvedilol. Continue on tele   Essential hypertension: continue on home dose of carvedilol & amlodpine    Leukocytosis: likely reactive. Will continue to monitor  Thrombocytosis: likely reactive. Will continue to monitor    Discharge Instructions  Discharge Instructions    AMB Referral to Cardiac Rehabilitation - Phase II   Complete by: As directed    Diagnosis:  Coronary Stents NSTEMI     After initial evaluation and assessments completed: Virtual Based Care may be provided alone or in conjunction with Phase 2 Cardiac Rehab based on patient barriers.: Yes   Diet - low sodium heart healthy   Complete by: As directed    Discharge instructions   Complete by: As directed    F/U PCP in 1-2 weeks; F/u cardio (Dr. Saunders Revel) in 1 week   Increase activity slowly  Complete by: As directed      Allergies as of 08/10/2019   No Known Allergies     Medication List    STOP taking these medications   hydrochlorothiazide 12.5 MG tablet Commonly known as: HYDRODIURIL     TAKE these medications   amLODipine 5 MG tablet Commonly known as: NORVASC Take 1 tablet (5 mg total) by mouth daily.   aspirin 81 MG EC tablet Take 1 tablet (81 mg total) by mouth  daily. Start taking on: August 11, 2019   calcium carbonate 1250 (500 Ca) MG tablet Commonly known as: OS-CAL - dosed in mg of elemental calcium Take 1 tablet by mouth daily.   carvedilol 25 MG tablet Commonly known as: COREG Take 1 tablet (25 mg total) by mouth 2 (two) times daily.   fenofibrate 54 MG tablet Take 1 tablet (54 mg total) by mouth daily.   Fish Oil 500 MG Caps Take 500 mg by mouth daily.   potassium chloride SA 20 MEQ tablet Commonly known as: Klor-Con M20 Take 1 tablet (20 mEq total) by mouth 2 (two) times daily.   rosuvastatin 10 MG tablet Commonly known as: Crestor Take 1 tablet (10 mg total) by mouth daily.   spironolactone 25 MG tablet Commonly known as: ALDACTONE Take 1 tablet (25 mg total) by mouth daily. Start taking on: August 11, 2019   ticagrelor 90 MG Tabs tablet Commonly known as: BRILINTA Take 1 tablet (90 mg total) by mouth 2 (two) times daily.   Vitamin D (Ergocalciferol) 1.25 MG (50000 UT) Caps capsule Commonly known as: DRISDOL Take 1 capsule (50,000 Units total) by mouth every 7 (seven) days.      Follow-up Information    [facility] Follow up.   Specialty: Cardiac Rehabilitation Why: Your Cardiologist has referred you to outpatient Cardiac Rehab at [facility]. Per your discussion with Roanna Epley, RN, Cardiac Nurse Navigator, you declined to participate in the Cardiac Rehab program at this time.   Contact information: [place] [id number] [place] [phone]       Nelva Bush, MD Follow up in 1 week(s).   Specialty: Cardiology Contact information: [place] [address] [phone]          No Known Allergies  Consultations:  cardio   Procedures/Studies: DG Chest 2 View  Result Date: 08/08/2019 CLINICAL DATA:  Chest pain EXAM: CHEST - 2 VIEW COMPARISON:  None. FINDINGS: The heart size and mediastinal contours are  within normal limits. Both lungs are clear. Chronic appearing slight superior compression deformity of T6 through T9 vertebral bodies is seen. There is also anterior compression deformity of T12 vertebral body with 50% loss of vertebral body height, slightly progressed since the prior CT. IMPRESSION: No active cardiopulmonary disease. Electronically Signed   By: Prudencio Pair M.D.   On: 08/08/2019 23:22   CARDIAC CATHETERIZATION  Result Date: 08/09/2019 Conclusions: 1. Severe single-vessel coronary artery disease with 90% ulcerative stenosis in the mid LAD.  There is also moderate disease involving the proximal LAD as well as the distal RCA. 2. Moderately reduced left ventricular systolic function with mid/apical anterior hypokinesis and apical akinesis. 3. Mildly elevated left ventricular filling pressure. 4. Successful PCI to the mid LAD using Synergy 2.5 x 16 mm drug-eluting stent with 0% residual stenosis and TIMI-3 flow. Recommendations: 1. Dual antiplatelet therapy with aspirin and ticagrelor for at least 12 months. 2. Aggressive secondary prevention, including LDL/triglyceride and  glycemic control. 3. Continue carvedilol; consider addition of ACE inhibitor or ARB tomorrow to optimize evidence-based heart failure therapy, if renal function and blood pressure allow. 4. Obtain transthoracic echocardiogram. Nelva Bush, MD [facility]   ECHOCARDIOGRAM COMPLETE  Result Date: 08/10/2019   ECHOCARDIOGRAM REPORT   Patient Name:   ZAKARIAH PARNES [facility] Date of Exam: 08/09/2019 Medical Rec #:  [id number]      Height:       64.0 in Accession #:    [id number]     Weight:       160.5 lb Date of Birth:  04-24-1941      BSA:          1.78 m Patient Age:    62 years       BP:           152/85 mmHg Patient Gender: F              HR:           69 bpm. Exam Location:  [facility] Procedure: 2D Echo, Cardiac Doppler and Color Doppler Indications:     Acute MI 410  History:         Patient has no prior history of Echocardiogram  examinations.                  Signs/Symptoms:Murmur; Risk Factors:Hypertension.  Sonographer:     Alyse Low Roar Referring Phys:  [id number] CHRISTOPHER END Diagnosing Phys: Nelva Bush MD IMPRESSIONS  1. Left ventricular ejection fraction, by visual estimation, is 40 to 45%. The left ventricle has moderately decreased function. There is mildly increased left ventricular hypertrophy.  2. There is akinesis of the left ventricular, mid-apical anterior wall, anteroseptal wall and apical segment.  3. Elevated left atrial pressure.  4. Left ventricular diastolic parameters are consistent with Grade I diastolic dysfunction (impaired relaxation).  5. The left ventricle demonstrates regional wall motion abnormalities.  6. Global right ventricle has low normal systolic function.The right ventricular size is normal. Mildly increased right ventricular wall thickness.  7. Left atrial size was normal.  8. Right atrial size was normal.  9. Mild to moderate mitral annular calcification. 10. The mitral valve is degenerative. Trivial mitral valve regurgitation. No evidence of mitral stenosis. 11. The tricuspid valve is normal in structure. 12. The aortic valve is grossly normal. Aortic valve regurgitation is not visualized. Mild aortic valve sclerosis without stenosis. 13. Pulmonic regurgitation is mild. 14. The pulmonic valve was not well visualized. Pulmonic valve regurgitation is mild. 15. The interatrial septum was not well visualized. FINDINGS  Left Ventricle: Left ventricular ejection fraction, by visual estimation, is 40 to 45%. The left ventricle has moderately decreased function. There is akinesis of the left ventricular, mid-apical anterior wall, anteroseptal wall and apical segment. The left ventricle demonstrates regional wall motion abnormalities. The left ventricular internal cavity size was the left ventricle is normal in size. There is mildly increased left ventricular hypertrophy. Asymmetric left ventricular  hypertrophy of the basal-septal wall. Left ventricular diastolic parameters are consistent with Grade I diastolic dysfunction (impaired relaxation). Elevated left atrial pressure. Right Ventricle: The right ventricular size is normal. Mildly increased right ventricular wall thickness. Global RV systolic function is has low normal systolic function. Left Atrium: Left atrial size was normal in size. Right Atrium: Right atrial size was normal in size Pericardium: There is no evidence of pericardial effusion. Mitral Valve: The mitral valve is degenerative in appearance. There is mild thickening of the mitral valve leaflet(s).  There is mild calcification of the mitral valve leaflet(s). Mild to moderate mitral annular calcification. Trivial mitral valve regurgitation. No evidence of mitral valve stenosis by observation. Tricuspid Valve: The tricuspid valve is normal in structure. Tricuspid valve regurgitation is trivial. Aortic Valve: The aortic valve is grossly normal. Aortic valve regurgitation is not visualized. Mild aortic valve sclerosis is present, with no evidence of aortic valve stenosis. Aortic valve mean gradient measures 7.0 mmHg. Aortic valve peak gradient measures 11.3 mmHg. Aortic valve area, by VTI measures 1.55 cm. Pulmonic Valve: The pulmonic valve was not well visualized. Pulmonic valve regurgitation is mild. Pulmonic regurgitation is mild. No evidence of pulmonic stenosis. Aorta: The aortic root is normal in size and structure. Pulmonary Artery: The pulmonary artery is of normal size and origin. Venous: The inferior vena cava was not well visualized. IAS/Shunts: The interatrial septum was not well visualized.  LEFT VENTRICLE PLAX 2D LVIDd:         4.09 cm  Diastology LVIDs:         2.80 cm  LV e' lateral:   6.09 cm/s LV PW:         0.87 cm  LV E/e' lateral: 15.2 LV IVS:        1.14 cm  LV e' medial:    4.79 cm/s LVOT diam:     1.60 cm  LV E/e' medial:  19.3 LV SV:         44 ml LV SV Index:   24.15  LVOT Area:     2.01 cm  RIGHT VENTRICLE RV S prime:     12.40 cm/s LEFT ATRIUM             Index       RIGHT ATRIUM           Index LA diam:        3.90 cm 2.19 cm/m  RA Area:     14.10 cm LA Vol (A2C):   48.4 ml 27.17 ml/m RA Volume:   32.00 ml  17.96 ml/m LA Vol (A4C):   52.8 ml 29.63 ml/m LA Biplane Vol: 52.1 ml 29.24 ml/m  AORTIC VALVE                    PULMONIC VALVE AV Area (Vmax):    1.62 cm     PV Vmax:        1.14 m/s AV Area (Vmean):   1.58 cm     PV Peak grad:   5.2 mmHg AV Area (VTI):     1.55 cm     RVOT Peak grad: 2 mmHg AV Vmax:           168.00 cm/s AV Vmean:          130.000 cm/s AV VTI:            0.373 m AV Peak Grad:      11.3 mmHg AV Mean Grad:      7.0 mmHg LVOT Vmax:         135.00 cm/s LVOT Vmean:        102.000 cm/s LVOT VTI:          0.288 m LVOT/AV VTI ratio: 0.77  AORTA Ao Root diam: 2.50 cm MITRAL VALVE                         TRICUSPID VALVE MV Area (PHT): 4.41 cm  TR Peak grad:   24.5 mmHg MV PHT:        49.88 msec            TR Vmax:        266.00 cm/s MV Decel Time: 172 msec MV E velocity: 92.60 cm/s  103 cm/s  SHUNTS MV A velocity: 119.00 cm/s 70.3 cm/s Systemic VTI:  0.29 m MV E/A ratio:  0.78        1.5       Systemic Diam: 1.60 cm  Nelva Bush MD Electronically signed by Nelva Bush MD Signature Date/Time: 08/10/2019/7:17:12 AM    Final        Subjective: Pt c/o fatigue  Discharge Exam: Vitals:   08/10/19 1200 08/10/19 1300  BP: 111/60 (!) 96/54  Pulse: 71 75  Resp: 20 20  Temp:    SpO2: 96% 96%   Vitals:   08/10/19 1000 08/10/19 1100 08/10/19 1200 08/10/19 1300  BP:  123/73 111/60 (!) 96/54  Pulse: 79 78 71 75  Resp: (!) 21 13 20 20   Temp: 97.6 F (36.4 C)     TempSrc: Oral     SpO2: 96% 98% 96% 96%  Weight:      Height:        General: Pt is alert, awake, not in acute distress Cardiovascular:  S1/S2 +, no rubs, no gallops Respiratory: CTA bilaterally, no wheezing, no rhonchi Abdominal: Soft, NT, ND, bowel sounds  + Extremities: no edema, no cyanosis    The results of significant diagnostics from this hospitalization (including imaging, microbiology, ancillary and laboratory) are listed below for reference.     Microbiology: Recent Results (from the past 240 hour(s))  SARS CORONAVIRUS 2 (TAT 6-24 HRS) Nasopharyngeal Nasopharyngeal Swab     Status: None   Collection Time: 08/09/19 12:42 AM   Specimen: Nasopharyngeal Swab  Result Value Ref Range Status   SARS Coronavirus 2 NEGATIVE NEGATIVE Final    Comment: (NOTE) SARS-CoV-2 target nucleic acids are NOT DETECTED. The SARS-CoV-2 RNA is generally detectable in upper and lower respiratory specimens during the acute phase of infection. Negative results do not preclude SARS-CoV-2 infection, do not rule out co-infections with other pathogens, and should not be used as the sole basis for treatment or other patient management decisions. Negative results must be combined with clinical observations, patient history, and epidemiological information. The expected result is Negative. Fact Sheet for Patients: SugarRoll.be Fact Sheet for Healthcare Providers: https://www.woods-mathews.com/ This test is not yet approved or cleared by the [place] FDA and  has been authorized for detection and/or diagnosis of SARS-CoV-2 by FDA under an Emergency Use Authorization (EUA). This EUA will remain  in effect (meaning this test can be used) for the duration of the COVID-19 declaration under Section 56 4(b)(1) of the Act, 21 U.S.C. section 360bbb-3(b)(1), unless the authorization is terminated or revoked sooner. Performed at [facility], [place] [address], [place], [place] [address]   Respiratory Panel by RT PCR (Flu A&B, Covid) - Nasopharyngeal Swab     Status: None   Collection Time: 08/09/19 10:07 AM   Specimen: Nasopharyngeal Swab  Result Value Ref Range Status   SARS Coronavirus 2 by RT PCR NEGATIVE  NEGATIVE Final    Comment: (NOTE) SARS-CoV-2 target nucleic acids are NOT DETECTED. The SARS-CoV-2 RNA is generally detectable in upper respiratoy specimens during the acute phase of infection. The lowest concentration of SARS-CoV-2 viral copies this assay can detect is 131 copies/mL. A negative result  does not preclude SARS-Cov-2 infection and should not be used as the sole basis for treatment or other patient management decisions. A negative result may occur with  improper specimen collection/handling, submission of specimen other than nasopharyngeal swab, presence of viral mutation(s) within the areas targeted by this assay, and inadequate number of viral copies (<131 copies/mL). A negative result must be combined with clinical observations, patient history, and epidemiological information. The expected result is Negative. Fact Sheet for Patients:  PinkCheek.be Fact Sheet for Healthcare Providers:  GravelBags.it This test is not yet ap proved or cleared by the [place] FDA and  has been authorized for detection and/or diagnosis of SARS-CoV-2 by FDA under an Emergency Use Authorization (EUA). This EUA will remain  in effect (meaning this test can be used) for the duration of the COVID-19 declaration under Section 564(b)(1) of the Act, 21 U.S.C. section 360bbb-3(b)(1), unless the authorization is terminated or revoked sooner.    Influenza A by PCR NEGATIVE NEGATIVE Final   Influenza B by PCR NEGATIVE NEGATIVE Final    Comment: (NOTE) The Xpert Xpress SARS-CoV-2/FLU/RSV assay is intended as an aid in  the diagnosis of influenza from Nasopharyngeal swab specimens and  should not be used as a sole basis for treatment. Nasal washings and  aspirates are unacceptable for Xpert Xpress SARS-CoV-2/FLU/RSV  testing. Fact Sheet for Patients: PinkCheek.be Fact Sheet for Healthcare  Providers: GravelBags.it This test is not yet approved or cleared by the [place] FDA and  has been authorized for detection and/or diagnosis of SARS-CoV-2 by  FDA under an Emergency Use Authorization (EUA). This EUA will remain  in effect (meaning this test can be used) for the duration of the  Covid-19 declaration under Section 564(b)(1) of the Act, 21  U.S.C. section 360bbb-3(b)(1), unless the authorization is  terminated or revoked. Performed at [facility], [address] [place], [place] [address]   MRSA PCR Screening     Status: None   Collection Time: 08/09/19  3:11 PM   Specimen: Nasal Mucosa; Nasopharyngeal  Result Value Ref Range Status   MRSA by PCR NEGATIVE NEGATIVE Final    Comment:        The GeneXpert MRSA Assay (FDA approved for NASAL specimens only), is one component of a comprehensive MRSA colonization surveillance program. It is not intended to diagnose MRSA infection nor to guide or monitor treatment for MRSA infections. Performed at [facility], [address] [place], [place] [address]      Labs: BNP (last 3 results) No results for input(s): BNP in the last 8760 hours. Basic Metabolic Panel: Recent Labs  Lab 08/08/19 2310 08/09/19 0827 08/10/19 0506 08/10/19 1556  NA 139  --  140  --   K 3.4*  --  3.0* 3.9  CL 105  --  107  --   CO2 22  --  23  --   GLUCOSE 226*  --  114*  --   BUN 14  --  9  --   CREATININE 0.77  --  0.75  --   CALCIUM 9.3  --  8.6*  --   MG  --  2.0  --   --    Liver Function Tests: No results for input(s): AST, ALT, ALKPHOS, BILITOT, PROT, ALBUMIN in the last 168 hours. No results for input(s): LIPASE, AMYLASE in the last 168 hours. No results for input(s): AMMONIA in the last 168 hours. CBC: Recent Labs  Lab 08/08/19 2310 08/10/19 0506  WBC 14.3* 11.1*  HGB 14.2 12.2  HCT 41.1 35.9*  MCV 83.2 83.7  PLT 420* 350   Cardiac Enzymes: No results for  input(s): CKTOTAL, CKMB, CKMBINDEX, TROPONINI in the last 168 hours. BNP: Invalid input(s): POCBNP CBG: Recent Labs  Lab 08/09/19 1935 08/09/19 2131 08/10/19 0805 08/10/19 1125 08/10/19 1546  GLUCAP 133* 116* 111* 129* 112*   D-Dimer No results for input(s): DDIMER in the last 72 hours. Hgb A1c No results for input(s): HGBA1C in the last 72 hours. Lipid Profile Recent Labs    08/09/19 0827 08/10/19 0506  CHOL 144 114  HDL 48 41  LDLCALC 60 38  TRIG 181* 175*  CHOLHDL 3.0 2.8   Thyroid function studies Recent Labs    08/09/19 0827  TSH 1.892   Anemia work up No results for input(s): VITAMINB12, FOLATE, FERRITIN, TIBC, IRON, RETICCTPCT in the last 72 hours. Urinalysis    Component Value Date/Time   COLORURINE YELLOW (A) 07/18/2017 1548   APPEARANCEUR CLEAR (A) 07/18/2017 1548   LABSPEC 1.012 07/18/2017 1548   PHURINE 6.0 07/18/2017 1548   GLUCOSEU NEGATIVE 07/18/2017 1548   HGBUR NEGATIVE 07/18/2017 1548   BILIRUBINUR NEGATIVE 07/18/2017 1548   KETONESUR NEGATIVE 07/18/2017 1548   PROTEINUR NEGATIVE 07/18/2017 1548   NITRITE NEGATIVE 07/18/2017 1548   LEUKOCYTESUR NEGATIVE 07/18/2017 1548   Sepsis Labs Invalid input(s): PROCALCITONIN,  WBC,  LACTICIDVEN Microbiology Recent Results (from the past 240 hour(s))  SARS CORONAVIRUS 2 (TAT 6-24 HRS) Nasopharyngeal Nasopharyngeal Swab     Status: None   Collection Time: 08/09/19 12:42 AM   Specimen: Nasopharyngeal Swab  Result Value Ref Range Status   SARS Coronavirus 2 NEGATIVE NEGATIVE Final    Comment: (NOTE) SARS-CoV-2 target nucleic acids are NOT DETECTED. The SARS-CoV-2 RNA is generally detectable in upper and lower respiratory specimens during the acute phase of infection. Negative results do not preclude SARS-CoV-2 infection, do not rule out co-infections with other pathogens, and should not be used as the sole basis for treatment or other patient management decisions. Negative results must be combined  with clinical observations, patient history, and epidemiological information. The expected result is Negative. Fact Sheet for Patients: SugarRoll.be Fact Sheet for Healthcare Providers: https://www.woods-mathews.com/ This test is not yet approved or cleared by the [place] FDA and  has been authorized for detection and/or diagnosis of SARS-CoV-2 by FDA under an Emergency Use Authorization (EUA). This EUA will remain  in effect (meaning this test can be used) for the duration of the COVID-19 declaration under Section 56 4(b)(1) of the Act, 21 U.S.C. section 360bbb-3(b)(1), unless the authorization is terminated or revoked sooner. Performed at [facility], [place] [address] [place], [place] [address]   Respiratory Panel by RT PCR (Flu A&B, Covid) - Nasopharyngeal Swab     Status: None   Collection Time: 08/09/19 10:07 AM   Specimen: Nasopharyngeal Swab  Result Value Ref Range Status   SARS Coronavirus 2 by RT PCR NEGATIVE NEGATIVE Final    Comment: (NOTE) SARS-CoV-2 target nucleic acids are NOT DETECTED. The SARS-CoV-2 RNA is generally detectable in upper respiratoy specimens during the acute phase of infection. The lowest concentration of SARS-CoV-2 viral copies this assay can detect is 131 copies/mL. A negative result does not preclude SARS-Cov-2 infection and should not be used as the sole basis for treatment or other patient management decisions. A negative result may occur with  improper specimen collection/handling, submission of specimen other than nasopharyngeal swab, presence of viral mutation(s)  within the areas targeted by this assay, and inadequate number of viral copies (<131 copies/mL). A negative result must be combined with clinical observations, patient history, and epidemiological information. The expected result is Negative. Fact Sheet for Patients:  PinkCheek.be Fact Sheet for  Healthcare Providers:  GravelBags.it This test is not yet ap proved or cleared by the [place] FDA and  has been authorized for detection and/or diagnosis of SARS-CoV-2 by FDA under an Emergency Use Authorization (EUA). This EUA will remain  in effect (meaning this test can be used) for the duration of the COVID-19 declaration under Section 564(b)(1) of the Act, 21 U.S.C. section 360bbb-3(b)(1), unless the authorization is terminated or revoked sooner.    Influenza A by PCR NEGATIVE NEGATIVE Final   Influenza B by PCR NEGATIVE NEGATIVE Final    Comment: (NOTE) The Xpert Xpress SARS-CoV-2/FLU/RSV assay is intended as an aid in  the diagnosis of influenza from Nasopharyngeal swab specimens and  should not be used as a sole basis for treatment. Nasal washings and  aspirates are unacceptable for Xpert Xpress SARS-CoV-2/FLU/RSV  testing. Fact Sheet for Patients: PinkCheek.be Fact Sheet for Healthcare Providers: GravelBags.it This test is not yet approved or cleared by the [place] FDA and  has been authorized for detection and/or diagnosis of SARS-CoV-2 by  FDA under an Emergency Use Authorization (EUA). This EUA will remain  in effect (meaning this test can be used) for the duration of the  Covid-19 declaration under Section 564(b)(1) of the Act, 21  U.S.C. section 360bbb-3(b)(1), unless the authorization is  terminated or revoked. Performed at [facility], [address] [place], [place] [address]   MRSA PCR Screening     Status: None   Collection Time: 08/09/19  3:11 PM   Specimen: Nasal Mucosa; Nasopharyngeal  Result Value Ref Range Status   MRSA by PCR NEGATIVE NEGATIVE Final    Comment:        The GeneXpert MRSA Assay (FDA approved for NASAL specimens only), is one component of a comprehensive MRSA colonization surveillance program. It is not intended to diagnose  MRSA infection nor to guide or monitor treatment for MRSA infections. Performed at [facility], [address], [place], [place] [address]      Time coordinating discharge: Over 30 minutes  SIGNED:   Wyvonnia Dusky, MD  [facility] 08/10/2019, 4:36 PM Pager   If 7PM-7AM, please contact night-coverage [email or web address] Password [email or web address]

## 2019-08-10 NOTE — TOC Transition Note (Signed)
Transition of Care [facility]) - CM/SW Discharge Note   Patient Details  Name: Janice Liu MRN: [id number] Date of Birth: 01-05-1941  Transition of Care [facility]) CM/SW Contact:  Candie Chroman, LCSW Phone Number: 08/10/2019, 4:53 PM   Clinical Narrative:  CSW met with patient and introduced role. No supports at bedside. Patient confirmed she needed a blood pressure cuff to take home. Per Cardiovascular & Pulmonary Nurse Navigator Cardiovascular & Pulmonary Nurse Navigator, patient has financial issues. CSW provided blood pressure cuff and 30-day free card for Brilinta. After 30 days, copay will be around $100. Patient is aware and will notify Dr. Saunders Revel at her appt in about a week. No further concerns. Patient has orders to discharge home today. CSW signing off.    Final next level of care: Home/Self Care Barriers to Discharge: Barriers Resolved   Patient Goals and CMS Choice     Choice offered to / list presented to : NA  Discharge Placement                       Discharge Plan and Services     Post Acute Care Choice: NA                               Social Determinants of Health (SDOH) Interventions     Readmission Risk Interventions No flowsheet data found.

## 2019-08-10 NOTE — Telephone Encounter (Addendum)
TCM....  Patient is being discharged   They saw End  They are scheduled to see Thurmond Butts 1/11 at 10 am   They were seen for NSTEMI s/p PCI  They need to be seen within 1-2 weeks      Please call  Jenn- I did not see an order for labs yet and appt was scheduled with Evon at ICU [facility]

## 2019-08-10 NOTE — Telephone Encounter (Signed)
-----   Message from Nelva Bush, MD sent at 08/10/2019  1:56 PM EST ----- Regarding: Labs and TOC Hello,  Ms. Langill will likely be discharged this afternoon following PCI for NSTEMI.  Could you help arrange for the following appointments:  1.  BMP on Thursday (12/31) to reassess renal function in the setting of hypokalemia and ischemic cardiomyopathy. 2.  TOC appointment with me or an APP in 1-2 weeks.  Let me know if any issues arise.  Thanks.  Gerald Stabs

## 2019-08-10 NOTE — Consult Note (Signed)
Progress Note  Patient Name: Janice Liu Date of Encounter: 08/10/2019  Primary Cardiologist: Kathlyn Sacramento, MD   Subjective   Patient denies CP, racing HR, palpitations, SOB, or any other symptoms. She reportedly slept well for the first time in months.   Inpatient Medications    Scheduled Meds: . aspirin EC  81 mg Oral Daily  . carvedilol  25 mg Oral BID  . Chlorhexidine Gluconate Cloth  6 each Topical Daily  . enoxaparin (LOVENOX) injection  40 mg Subcutaneous Q24H  . fenofibrate  54 mg Oral q1800  . heart attack bouncing book   Does not apply Once  . insulin aspart  0-9 Units Subcutaneous TID WC  . potassium chloride  40 mEq Oral Q4H  . rosuvastatin  10 mg Oral q1800  . sodium chloride flush  3 mL Intravenous Q12H  . sodium chloride flush  3 mL Intravenous Q12H  . spironolactone  25 mg Oral Daily  . ticagrelor  90 mg Oral BID   Continuous Infusions: . sodium chloride     PRN Meds: sodium chloride, acetaminophen, nitroGLYCERIN, ondansetron (ZOFRAN) IV, sodium chloride flush   Vital Signs    Vitals:   08/10/19 0900 08/10/19 1000 08/10/19 1100 08/10/19 1200  BP:   123/73 111/60  Pulse: 80 79 78 71  Resp: (!) 22 (!) 21 13 20   Temp:  97.6 F (36.4 C)    TempSrc:  Oral    SpO2: 96% 96% 98% 96%  Weight:      Height:        Intake/Output Summary (Last 24 hours) at 08/10/2019 1232 Last data filed at 08/10/2019 1200 Gross per 24 hour  Intake 363 ml  Output 950 ml  Net -587 ml   Last 3 Weights 08/09/2019 08/09/2019 08/08/2019  Weight (lbs) 160 lb 7.9 oz 165 lb 165 lb  Weight (kg) 72.8 kg 74.844 kg 74.844 kg      Telemetry    NSR, 60-80s, PVCs, NSVT- Personally Reviewed  ECG    No new tracings available through EMR - Personally Reviewed  Physical Exam   GEN: No acute distress.  Resting in bed. Neck: No JVD Cardiac: RRR. No rubs, murmurs, or gallops. R radial cath site without signs of infection, hematoma, or swelling. Respiratory: Clear to  auscultation bilaterally. GI: Soft, nontender, non-distended  MS: No edema; No deformity. Neuro:  Nonfocal  Psych: Normal affect   Labs    High Sensitivity Troponin:   Recent Labs  Lab 08/08/19 2310 08/09/19 0827  TROPONINIHS 1,140* >27,000*      Cardiac EnzymesNo results for input(s): TROPONINI in the last 168 hours. No results for input(s): TROPIPOC in the last 168 hours.   Chemistry Recent Labs  Lab 08/08/19 2310 08/10/19 0506  NA 139 140  K 3.4* 3.0*  CL 105 107  CO2 22 23  GLUCOSE 226* 114*  BUN 14 9  CREATININE 0.77 0.75  CALCIUM 9.3 8.6*  GFRNONAA >60 >60  GFRAA >60 >60  ANIONGAP 12 10     Hematology Recent Labs  Lab 08/08/19 2310 08/10/19 0506  WBC 14.3* 11.1*  RBC 4.94 4.29  HGB 14.2 12.2  HCT 41.1 35.9*  MCV 83.2 83.7  MCH 28.7 28.4  MCHC 34.5 34.0  RDW 13.2 13.4  PLT 420* 350    BNPNo results for input(s): BNP, PROBNP in the last 168 hours.   DDimer No results for input(s): DDIMER in the last 168 hours.   Radiology  DG Chest 2 View  Result Date: 08/08/2019 CLINICAL DATA:  Chest pain EXAM: CHEST - 2 VIEW COMPARISON:  None. FINDINGS: The heart size and mediastinal contours are within normal limits. Both lungs are clear. Chronic appearing slight superior compression deformity of T6 through T9 vertebral bodies is seen. There is also anterior compression deformity of T12 vertebral body with 50% loss of vertebral body height, slightly progressed since the prior CT. IMPRESSION: No active cardiopulmonary disease. Electronically Signed   By: Prudencio Pair M.D.   On: 08/08/2019 23:22   CARDIAC CATHETERIZATION  Result Date: 08/09/2019 Conclusions: 1. Severe single-vessel coronary artery disease with 90% ulcerative stenosis in the mid LAD.  There is also moderate disease involving the proximal LAD as well as the distal RCA. 2. Moderately reduced left ventricular systolic function with mid/apical anterior hypokinesis and apical akinesis. 3. Mildly  elevated left ventricular filling pressure. 4. Successful PCI to the mid LAD using Synergy 2.5 x 16 mm drug-eluting stent with 0% residual stenosis and TIMI-3 flow. Recommendations: 1. Dual antiplatelet therapy with aspirin and ticagrelor for at least 12 months. 2. Aggressive secondary prevention, including LDL/triglyceride and glycemic control. 3. Continue carvedilol; consider addition of ACE inhibitor or ARB tomorrow to optimize evidence-based heart failure therapy, if renal function and blood pressure allow. 4. Obtain transthoracic echocardiogram. Nelva Bush, MD [facility]   ECHOCARDIOGRAM COMPLETE  Result Date: 08/10/2019   ECHOCARDIOGRAM REPORT   Patient Name:   Janice Liu [facility] Date of Exam: 08/09/2019 Medical Rec #:  [id number]      Height:       64.0 in Accession #:    [id number]     Weight:       160.5 lb Date of Birth:  16-Jul-1941      BSA:          1.78 m Patient Age:    78 years       BP:           152/85 mmHg Patient Gender: F              HR:           69 bpm. Exam Location:  [facility] Procedure: 2D Echo, Cardiac Doppler and Color Doppler Indications:     Acute MI 410  History:         Patient has no prior history of Echocardiogram examinations.                  Signs/Symptoms:Murmur; Risk Factors:Hypertension.  Sonographer:     Alyse Low Roar Referring Phys:  [id number] CHRISTOPHER END Diagnosing Phys: Nelva Bush MD IMPRESSIONS  1. Left ventricular ejection fraction, by visual estimation, is 40 to 45%. The left ventricle has moderately decreased function. There is mildly increased left ventricular hypertrophy.  2. There is akinesis of the left ventricular, mid-apical anterior wall, anteroseptal wall and apical segment.  3. Elevated left atrial pressure.  4. Left ventricular diastolic parameters are consistent with Grade I diastolic dysfunction (impaired relaxation).  5. The left ventricle demonstrates regional wall motion abnormalities.  6. Global right ventricle has low normal systolic  function.The right ventricular size is normal. Mildly increased right ventricular wall thickness.  7. Left atrial size was normal.  8. Right atrial size was normal.  9. Mild to moderate mitral annular calcification. 10. The mitral valve is degenerative. Trivial mitral valve regurgitation. No evidence of mitral stenosis. 11. The tricuspid valve is normal in structure. 12. The aortic valve is grossly normal. Aortic valve  regurgitation is not visualized. Mild aortic valve sclerosis without stenosis. 13. Pulmonic regurgitation is mild. 14. The pulmonic valve was not well visualized. Pulmonic valve regurgitation is mild. 15. The interatrial septum was not well visualized. FINDINGS  Left Ventricle: Left ventricular ejection fraction, by visual estimation, is 40 to 45%. The left ventricle has moderately decreased function. There is akinesis of the left ventricular, mid-apical anterior wall, anteroseptal wall and apical segment. The left ventricle demonstrates regional wall motion abnormalities. The left ventricular internal cavity size was the left ventricle is normal in size. There is mildly increased left ventricular hypertrophy. Asymmetric left ventricular hypertrophy of the basal-septal wall. Left ventricular diastolic parameters are consistent with Grade I diastolic dysfunction (impaired relaxation). Elevated left atrial pressure. Right Ventricle: The right ventricular size is normal. Mildly increased right ventricular wall thickness. Global RV systolic function is has low normal systolic function. Left Atrium: Left atrial size was normal in size. Right Atrium: Right atrial size was normal in size Pericardium: There is no evidence of pericardial effusion. Mitral Valve: The mitral valve is degenerative in appearance. There is mild thickening of the mitral valve leaflet(s). There is mild calcification of the mitral valve leaflet(s). Mild to moderate mitral annular calcification. Trivial mitral valve regurgitation. No  evidence of mitral valve stenosis by observation. Tricuspid Valve: The tricuspid valve is normal in structure. Tricuspid valve regurgitation is trivial. Aortic Valve: The aortic valve is grossly normal. Aortic valve regurgitation is not visualized. Mild aortic valve sclerosis is present, with no evidence of aortic valve stenosis. Aortic valve mean gradient measures 7.0 mmHg. Aortic valve peak gradient measures 11.3 mmHg. Aortic valve area, by VTI measures 1.55 cm. Pulmonic Valve: The pulmonic valve was not well visualized. Pulmonic valve regurgitation is mild. Pulmonic regurgitation is mild. No evidence of pulmonic stenosis. Aorta: The aortic root is normal in size and structure. Pulmonary Artery: The pulmonary artery is of normal size and origin. Venous: The inferior vena cava was not well visualized. IAS/Shunts: The interatrial septum was not well visualized.  LEFT VENTRICLE PLAX 2D LVIDd:         4.09 cm  Diastology LVIDs:         2.80 cm  LV e' lateral:   6.09 cm/s LV PW:         0.87 cm  LV E/e' lateral: 15.2 LV IVS:        1.14 cm  LV e' medial:    4.79 cm/s LVOT diam:     1.60 cm  LV E/e' medial:  19.3 LV SV:         44 ml LV SV Index:   24.15 LVOT Area:     2.01 cm  RIGHT VENTRICLE RV S prime:     12.40 cm/s LEFT ATRIUM             Index       RIGHT ATRIUM           Index LA diam:        3.90 cm 2.19 cm/m  RA Area:     14.10 cm LA Vol (A2C):   48.4 ml 27.17 ml/m RA Volume:   32.00 ml  17.96 ml/m LA Vol (A4C):   52.8 ml 29.63 ml/m LA Biplane Vol: 52.1 ml 29.24 ml/m  AORTIC VALVE                    PULMONIC VALVE AV Area (Vmax):    1.62 cm     PV  Vmax:        1.14 m/s AV Area (Vmean):   1.58 cm     PV Peak grad:   5.2 mmHg AV Area (VTI):     1.55 cm     RVOT Peak grad: 2 mmHg AV Vmax:           168.00 cm/s AV Vmean:          130.000 cm/s AV VTI:            0.373 m AV Peak Grad:      11.3 mmHg AV Mean Grad:      7.0 mmHg LVOT Vmax:         135.00 cm/s LVOT Vmean:        102.000 cm/s LVOT VTI:           0.288 m LVOT/AV VTI ratio: 0.77  AORTA Ao Root diam: 2.50 cm MITRAL VALVE                         TRICUSPID VALVE MV Area (PHT): 4.41 cm              TR Peak grad:   24.5 mmHg MV PHT:        49.88 msec            TR Vmax:        266.00 cm/s MV Decel Time: 172 msec MV E velocity: 92.60 cm/s  103 cm/s  SHUNTS MV A velocity: 119.00 cm/s 70.3 cm/s Systemic VTI:  0.29 m MV E/A ratio:  0.78        1.5       Systemic Diam: 1.60 cm  Nelva Bush MD Electronically signed by Nelva Bush MD Signature Date/Time: 08/10/2019/7:17:12 AM    Final     Cardiac Studies   Echo 08/09/2019  1. Left ventricular ejection fraction, by visual estimation, is 40 to 45%. The left ventricle has moderately decreased function. There is mildly increased left ventricular hypertrophy.  2. There is akinesis of the left ventricular, mid-apical anterior wall, anteroseptal wall and apical segment.  3. Elevated left atrial pressure.  4. Left ventricular diastolic parameters are consistent with Grade I diastolic dysfunction (impaired relaxation).  5. The left ventricle demonstrates regional wall motion abnormalities.  6. Global right ventricle has low normal systolic function.The right ventricular size is normal. Mildly increased right ventricular wall thickness.  7. Left atrial size was normal.  8. Right atrial size was normal.  9. Mild to moderate mitral annular calcification. 10. The mitral valve is degenerative. Trivial mitral valve regurgitation. No evidence of mitral stenosis. 11. The tricuspid valve is normal in structure. 12. The aortic valve is grossly normal. Aortic valve regurgitation is not visualized. Mild aortic valve sclerosis without stenosis. 13. Pulmonic regurgitation is mild. 14. The pulmonic valve was not well visualized. Pulmonic valve regurgitation is mild. 15. The interatrial septum was not well visualized.   LHC 08/09/2019 Coronary Diagrams  Diagnostic Dominance:  Right  Intervention    Conclusions: 1. Severe single-vessel coronary artery disease with 90% ulcerative stenosis in the mid LAD.  There is also moderate disease involving the proximal LAD as well as the distal RCA. 2. Moderately reduced left ventricular systolic function with mid/apical anterior hypokinesis and apical akinesis. 3. Mildly elevated left ventricular filling pressure. 4. Successful PCI to the mid LAD using Synergy 2.5 x 16 mm drug-eluting stent with 0% residual stenosis and TIMI-3 flow.  Recommendations: 1. Dual  antiplatelet therapy with aspirin and ticagrelor for at least 12 months. 2. Aggressive secondary prevention, including LDL/triglyceride and glycemic control. 3. Continue carvedilol; consider addition of ACE inhibitor or ARB tomorrow to optimize evidence-based heart failure therapy, if renal function and blood pressure allow. 4. Obtain transthoracic echocardiogram. Patient Profile     78 y.o. female with history of single vessel CAD with LHC and PCI to mLAD 08/09/2019, HFrEF (EF 40-45%), HTN, HLD, recently diagnosed DM2 (Hgb A1C 7.2), and who is being seen today for the evaluation of NSTEMI s/p LHC with PCI on 08/09/2019.  Assessment & Plan    NSTEMI --No further CP.  High-sensitivity troponin 1140  >27,000.  S/p LHC showing severe single-vessel CAD with 90% ulcerative stenosis mLAD and moderate disease in the pLAD and dRCA. Successful PCI performed to mLAD.  --Updated echo with EF reduced 40 to 45%, mild LVH, akinesis as outlined above, elevated left atrial pressure, G1DD, low normal RVSF, increased RV thickness.  --Continue DAPT with ASA 81mg  daily and Brilinta 90mg  BID for at least 12 months. Continue BB, statin, spironolactone, and PRN SL nitro. Current BP soft; however, recommend addition of ACE/ARB once room in pressure and before discharge if able.  --Aggressive repletion of potassium as below and recheck Mg as below, given electrolyte abnormalities place at  risk for arrhythmia.  --Recommend aggressive secondary prevention, including LDL/triglycerides/glycemic control. --Will need follow-up scheduled in the office within 1-2 weeks after discharge.  HFrEF (EF 40-45%) --Euvolemic on exam. No SOB. As above, recent echo shows EF 40-45%. Recommend update echo as outpatient in 3-6 months to reassess EF after PCI as above. Continue medical management with spironolactone and possible addition of ACE/ARB as above if BP allows. Follow-up BMET within 1 week recommended to reassess renal function and electrolytes.  Hypokalemia --K 3.0. Replete with goal 4.0. KCl tab 82mEq ordered today q4h with recommendation for close monitoring of potassium and Mg. Continue to monitor K with goal 4.0 and Mg with goal 2.0. Continue spironolactone, as this is a potassium sparing diuretic.   HTN --Current BP soft. As above, consider addition of ACE/ARB before discharge.  HLD --Most recent LDL 38 with consideration of Tg 175. LDL 184 in 2017.  Continue statin. Consider repeat direct LDL as outpatient.  DM2 --Newly diagnosed this admission. Hgb A1C 7.2. Per IM/PCP.  For questions or updates, please contact [place] Please consult [email or web address] for contact info under        Signed, Arvil Chaco, PA-C  08/10/2019, 12:32 PM

## 2019-08-10 NOTE — Progress Notes (Signed)
Cardiovascular and Pulmonary Nurse Navigator Note:    78 year old female with history of hypertension, hyperlipidemia, recently diagnosed DM2 (Hgb A1C 7.2), and patient reported family history of heart failure who ruled in for NSTEMI.   Patient underwent Cardiac Cath on 08/09/2019.  Report below.    Janice Bush, MD (Primary)  Marrianne Mood D, PA-C  Procedures 08/09/2019 CORONARY STENT INTERVENTION  LEFT HEART CATH AND CORONARY ANGIOGRAPHY  Conclusion  Conclusions: 1. Severe single-vessel coronary artery disease with 90% ulcerative stenosis in the mid LAD.  There is also moderate disease involving the proximal LAD as well as the distal RCA. 2. Moderately reduced left ventricular systolic function with mid/apical anterior hypokinesis and apical akinesis. 3. Mildly elevated left ventricular filling pressure. 4. Successful PCI to the mid LAD using Synergy 2.5 x 16 mm drug-eluting stent with 0% residual stenosis and TIMI-3 flow.  Recommendations: 1. Dual antiplatelet therapy with aspirin and ticagrelor for at least 12 months. 2. Aggressive secondary prevention, including LDL/triglyceride and glycemic control. 3. Continue carvedilol; consider addition of ACE inhibitor or ARB tomorrow to optimize evidence-based heart failure therapy, if renal function and blood pressure allow. 4. Obtain transthoracic echocardiogram.  Janice Bush, MD [facility]    ECHOCARDIOGRAM REPORT Patient Name: Janice Liu [facility] Date of Exam: 08/09/2019 Medical Rec #: [id number] Height: 64.0 in Accession #: [id number] Weight: 160.5 lb Date of Birth: 11-Apr-1941 BSA: 1.78 m Patient Age: 42 years BP: 152/85 mmHg Patient Gender: F HR: 69 bpm. Exam Location: [facility] Procedure: 2D Echo, Cardiac Doppler and Color Doppler Indications: Acute MI 410 History: Patient has no prior history of Echocardiogram examinations. Signs/Symptoms:Murmur; Risk Factors:Hypertension. Sonographer: Alyse Low Roar Referring Phys:  [id number] CHRISTOPHER END Diagnosing Phys: Janice Bush MD IMPRESSION:   1. Left ventricular ejection fraction, by visual estimation, is 40 to 45%. The left ventricle has moderately decreased function. There is mildly increased left ventricular hypertrophy. 2. There is akinesis of the left ventricular, mid-apical anterior wall, anteroseptal wall and apical segment. 3. Elevated left atrial pressure. 4. Left ventricular diastolic parameters are consistent with Grade I diastolic dysfunction (impaired relaxation). 5. The left ventricle demonstrates regional wall motion abnormalities. 6. Global right ventricle has low normal systolic function.The right ventricular size is normal. Mildly increased right ventricular wall thickness. 7. Left atrial size was normal. 8. Right atrial size was normal. 9. Mild to moderate mitral annular calcification. 10. The mitral valve is degenerative. Trivial mitral valve regurgitation. No evidence of mitral stenosis. 11. The tricuspid valve is normal in structure. 12. The aortic valve is grossly normal. Aortic valve regurgitation is not visualized. Mild aortic valve sclerosis without stenosis. 13. Pulmonic regurgitation is mild. 14. The pulmonic valve was not well visualized. Pulmonic valve regurgitation is mild. 15. The interatrial septum was not well visualized.   EDUCATION:   "Heart Attack Bouncing Back" booklet given and reviewed with patient. Discussed the definition of CAD. Reviewed the location of CAD and where her stent was placed. Informed patient she will be given a stent card. Explained the purpose of the stent card. Instructed patient to keep stent card in her wallet.  Discussed modifiable risk factors including controlling blood pressure, cholesterol, and blood sugar; following heart healthy diet; maintaining healthy weight; exercise; and smoking cessation, if applicable.  Discussed cardiac medications including rationale for taking, mechanisms of  action, and side effects. Stressed the importance of taking medications as prescribed.  Patient informed this RN that she has a glucose meter, but needed a blood pressure cuff.  This  RN reached out to Dayton Scrape, LCSW, to provide patient with blood pressure cuff, as patient does not have means to purchase one, and a Brilinta coupon.  Also, Dayton Scrape, LCSW, completing benefits check to what the cost of the Brilinta will be for this patient.    Discussed emergency plan for heart attack symptoms. Patient verbalized understanding of need to call 911 and not to drive himself to ER if having cardiac symptoms / chest pain.  Diet of low sodium, low fat, low cholesterol heart healthy / carb modified diet discussed. Information on diet provided.   Smoking Cessation - Patient is a NEVER smoker.   Exercise - Benefits of exercised discussed.  Informed patient her cardiologist has referred her to outpatient Cardiac Rehab. An overview of the program was provided. Brochure /  informational letter given to patient. This RN checked with Web designer to see what her co-pay for Cardiac Rehab would be.  Patient had not met her out-of-pocket  Deductible / co-insurance of $4200 yet and her new plan year starts over on August 13, 2019.  Informed patient her co-pay would be $45 per session.  Patient's response, "I'm sorry, but I just cannot afford that amount at this time, as I have other financial obligations." Patient declined to participate in the Cardiac Rehab program. Note:  Referral to outpatient Cardiac Rehab at [facility] closed. Discussed American Heart Association guidelines for moderate exercise (walking) of 150 minutes per week.  Patient response, "I use to have a stationary exercise bike.  Would that be o.k.?  Also, I can walk."  Riding stationary bike / walking plan encouraged.  Again, this RN discussed with the patient how exercise helps lower blood sugar, helps heart muscle (pump), helps relieve stress,  helps lower cholesterol and many more benefits.  Patient appreciative of the above information.    Roanna Epley, RN, BSN, [facility]  Cardiovascular & Pulmonary Nurse Navigator  Direct Line: [phone]  Department Phone #: [phone] Fax: [phone]  Email Address: [email or web address]

## 2019-08-12 NOTE — Telephone Encounter (Signed)
1st TCM attempt. No answer and no VM set up.

## 2019-08-16 ENCOUNTER — Other Ambulatory Visit: Payer: Self-pay | Admitting: Internal Medicine

## 2019-08-16 DIAGNOSIS — E7800 Pure hypercholesterolemia, unspecified: Secondary | ICD-10-CM

## 2019-08-17 NOTE — Telephone Encounter (Signed)
2nd TCM attempt. No answer and no VM set up.

## 2019-08-18 NOTE — Telephone Encounter (Signed)
3rd TCM attempt. No answer. Went straight to VM and not set up.  Attempted to reach patient's son, ok per dpr. No answer. Left message to call back.

## 2019-08-20 ENCOUNTER — Encounter: Payer: Self-pay | Admitting: Physician Assistant

## 2019-08-20 NOTE — Progress Notes (Deleted)
Cardiology Office Note    Date:  08/20/2019   ID:  SHANIECE BUSSA, DOB 20-Aug-1940, MRN [id number]  PCP:  Jearld Fenton, NP  Cardiologist:  Kathlyn Sacramento, MD  Electrophysiologist:  None   Chief Complaint: Hospital follow-up  History of Present Illness:   Janice Liu is a 79 y.o. female with history of CAD with recent NSTEMI status post PCI/DES to the mid LAD as outlined below, HFrEF secondary to ICM, reported remote history of A. fib with no documentation of this in her chart, DM2 diagnosed in 06/2019, HTN, and HLD who presents for hospital follow-up as detailed below  She previously underwent nuclear stress test in 07/2014 for atypical chest pain which was normal.  She was admitted to the hospital from 12/28-12/29 for NSTEMI.  She presented to hospital with acute onset of chest pain with initial troponin 1140 and subsequent/peaking troponin of greater than 27,000.  LHC on 08/09/2019 showed severe single-vessel CAD with 90% ulcerative stenosis in the mid LAD as well as moderate disease involving the proximal LAD estimated at 60% and distal RCA estimated at 60% (please see details below.)  Moderate reduced LV systolic function with mid/apical anterior wall hypokinesis and apical akinesis with mildly elevated LV filling pressure.  She underwent successful PCI/DES to the mid LAD.  Echo on 08/09/2019 showed an EF of 40 to 45%, mildly increased LVH, akinesis of the mid apical anterior, anteroseptal, and apical segment, grade 1 diastolic dysfunction, low normal RV systolic function with normal cavity size, mild to moderate mitral annular calcification with trivial mitral regurgitation, mild aortic valve sclerosis without stenosis, mild pulmonic regurgitation.  Medical therapy was recommended for her noncritical residual LAD and RCA disease.  She was not placed on ACE inhibitor/ARB secondary to relative hypotension.  Discharge medications: Amlodipine 5 mg (was not receiving during admission), ASA 81  mg, carvedilol 25 mg twice daily, fenofibrate 54 mg, fish oil 500 mg, KCl 20 mEq twice daily, Crestor 10 mg, spironolactone 25 mg, Brilinta 90 mg twice daily, along with her noncardiac medications.  Documented discharge weight: 70.8 kg  ***   Labs independently reviewed: 07/2019 - potassium 3.9, BUN 9, serum creatinine 0.75 Hgb 12.2, PLT 350, TC 114, TG 175, HDL 41, LDL 38, magnesium 2.0, TSH normal 06/2019 - albumin 4.6, AST/ALT normal, A1c 7.2  Past Medical History:  Diagnosis Date  . Allergy   . CAD (coronary artery disease)   . Chicken pox   . Diabetes mellitus ([place])   . Heart murmur   . HFrEF (heart failure with reduced ejection fraction) ([place])   . Hypertension     Past Surgical History:  Procedure Laterality Date  . CORONARY STENT INTERVENTION N/A 08/09/2019   Procedure: CORONARY STENT INTERVENTION;  Surgeon: Nelva Bush, MD;  Location: [place] [facility];  Service: Cardiovascular;  Laterality: N/A;  . LEFT HEART CATH AND CORONARY ANGIOGRAPHY N/A 08/09/2019   Procedure: LEFT HEART CATH AND CORONARY ANGIOGRAPHY;  Surgeon: Nelva Bush, MD;  Location: [place] [facility];  Service: Cardiovascular;  Laterality: N/A;    Current Medications: No outpatient medications have been marked as taking for the 08/23/19 encounter (Appointment) with Rise Mu, PA-C.    Allergies:   Patient has no known allergies.   Social History   Socioeconomic History  . Marital status: Widowed    Spouse name: Not on file  . Number of children: Not on file  . Years of education: Not on file  . Highest education  level: Not on file  Occupational History  . Not on file  Tobacco Use  . Smoking status: Never Smoker  . Smokeless tobacco: Never Used  Substance and Sexual Activity  . Alcohol use: No    Alcohol/week: 0.0 standard drinks  . Drug use: No  . Sexual activity: Never  Other Topics Concern  . Not on file  Social History Narrative  . Not on file   Social Determinants  of Health   Financial Resource Strain:   . Difficulty of Paying Living Expenses: Not on file  Food Insecurity:   . Worried About Charity fundraiser in the Last Year: Not on file  . Ran Out of Food in the Last Year: Not on file  Transportation Needs:   . Lack of Transportation (Medical): Not on file  . Lack of Transportation (Non-Medical): Not on file  Physical Activity:   . Days of Exercise per Week: Not on file  . Minutes of Exercise per Session: Not on file  Stress:   . Feeling of Stress : Not on file  Social Connections:   . Frequency of Communication with Friends and Family: Not on file  . Frequency of Social Gatherings with Friends and Family: Not on file  . Attends Religious Services: Not on file  . Active Member of Clubs or Organizations: Not on file  . Attends Archivist Meetings: Not on file  . Marital Status: Not on file     Family History:  The patient's family history includes Cerebral palsy in her son; Congestive Heart Failure in her brother and brother; Diabetes in her mother; Heart failure in her mother; Hypertension in her brother and mother.  ROS:   ROS   EKGs/Labs/Other Studies Reviewed:    Studies reviewed were summarized above. The additional studies were reviewed today:  2D echo 08/09/2019: 1. Left ventricular ejection fraction, by visual estimation, is 40 to 45%. The left ventricle has moderately decreased function. There is mildly increased left ventricular hypertrophy.  2. There is akinesis of the left ventricular, mid-apical anterior wall, anteroseptal wall and apical segment.  3. Elevated left atrial pressure.  4. Left ventricular diastolic parameters are consistent with Grade I diastolic dysfunction (impaired relaxation).  5. The left ventricle demonstrates regional wall motion abnormalities.  6. Global right ventricle has low normal systolic function.The right ventricular size is normal. Mildly increased right ventricular wall  thickness.  7. Left atrial size was normal.  8. Right atrial size was normal.  9. Mild to moderate mitral annular calcification. 10. The mitral valve is degenerative. Trivial mitral valve regurgitation. No evidence of mitral stenosis. 11. The tricuspid valve is normal in structure. 12. The aortic valve is grossly normal. Aortic valve regurgitation is not visualized. Mild aortic valve sclerosis without stenosis. 13. Pulmonic regurgitation is mild. 14. The pulmonic valve was not well visualized. Pulmonic valve regurgitation is mild. 15. The interatrial septum was not well visualized. __________  LHC 08/01/2019: Conclusions: 1. Severe single-vessel coronary artery disease with 90% ulcerative stenosis in the mid LAD.  There is also moderate disease involving the proximal LAD as well as the distal RCA. 2. Moderately reduced left ventricular systolic function with mid/apical anterior hypokinesis and apical akinesis. 3. Mildly elevated left ventricular filling pressure. 4. Successful PCI to the mid LAD using Synergy 2.5 x 16 mm drug-eluting stent with 0% residual stenosis and TIMI-3 flow.  Recommendations: 1. Dual antiplatelet therapy with aspirin and ticagrelor for at least 12 months. 2. Aggressive secondary  prevention, including LDL/triglyceride and glycemic control. 3. Continue carvedilol; consider addition of ACE inhibitor or ARB tomorrow to optimize evidence-based heart failure therapy, if renal function and blood pressure allow. 4. Obtain transthoracic echocardiogram.  Diagnostic Dominance: Right Left Main  Vessel is large. Vessel is angiographically normal.  Left Anterior Descending  Vessel is large.  Prox LAD to Mid LAD lesion 60% stenosed  Prox LAD to Mid LAD lesion is 60% stenosed.  Mid LAD-1 lesion 25% stenosed  Mid LAD-1 lesion is 25% stenosed.  Mid LAD-2 lesion 90% stenosed  Mid LAD-2 lesion is 90% stenosed. The lesion is ulcerative.  First Diagonal Branch  Vessel is  moderate in size.  Second Diagonal Branch  Vessel is small in size.  Third Diagonal Branch  Vessel is small in size.  Ramus Intermedius  Vessel is small. Vessel is angiographically normal.  Left Circumflex  Vessel is large. Vessel is angiographically normal.  First Obtuse Marginal Branch  Vessel is moderate in size.  Second Obtuse Marginal Branch  Vessel is small in size.  Third Obtuse Marginal Branch  Vessel is small in size.  Right Coronary Artery  Prox RCA lesion 40% stenosed  Prox RCA lesion is 40% stenosed.  Dist RCA-1 lesion 30% stenosed  Dist RCA-1 lesion is 30% stenosed.  Dist RCA-2 lesion 60% stenosed with side branch in RPAV 40% stenosed  Dist RCA-2 lesion is 60% stenosed with 40% stenosed side branch in RPAV.  Intervention  Mid LAD-2 lesion  Stent  Lesion length: 12 mm. CATHETER LAUNCHER 6FR EBU3.5 guide catheter was inserted. Lesion crossed with guidewire using a WIRE RUNTHROUGH .[id number]. Pre-stent angioplasty was performed using a BALLOON [place] RX 2.0X12. Maximum pressure: 12 atm. A drug-eluting stent was successfully placed using a STENT SYNERGY DES 2.5X16. Maximum pressure: 11 atm. Stent strut is well apposed. Post-stent angioplasty was performed using a BALLOON [place] TREK RX 2.5X12. Maximum pressure: 16 atm.  Post-Intervention Lesion Assessment  The intervention was successful. Pre-interventional TIMI flow is 2. Post-intervention TIMI flow is 3. No complications occurred at this lesion.  There is a 0% residual stenosis post intervention.     EKG:  EKG is ordered today.  The EKG ordered today demonstrates ***  Recent Labs: 07/01/2019: ALT 18 08/09/2019: Magnesium 2.0; TSH 1.892 08/10/2019: BUN 9; Creatinine, Ser 0.75; Hemoglobin 12.2; Platelets 350; Potassium 3.9; Sodium 140  Recent Lipid Panel    Component Value Date/Time   CHOL 114 08/10/2019 0506   CHOL 274 (H) 08/15/2015 0803   TRIG 175 (H) 08/10/2019 0506   HDL 41 08/10/2019 0506   HDL 38 (L)  08/15/2015 0803   CHOLHDL 2.8 08/10/2019 0506   VLDL 35 08/10/2019 0506   LDLCALC 38 08/10/2019 0506   LDLCALC 184 (H) 08/15/2015 0803   LDLDIRECT 87.0 07/01/2019 0916    PHYSICAL EXAM:    VS:  There were no vitals taken for this visit.  BMI: There is no height or weight on file to calculate BMI.  Physical Exam  Wt Readings from Last 3 Encounters:  08/09/19 160 lb 7.9 oz (72.8 kg)  03/09/19 163 lb (73.9 kg)  03/02/18 165 lb (74.8 kg)     ASSESSMENT & PLAN:   1. ***  Disposition: F/u with Dr. Fletcher Anon or an APP in ***.   Medication Adjustments/Labs and Tests Ordered: Current medicines are reviewed at length with the patient today.  Concerns regarding medicines are outlined above. Medication changes, Labs and Tests ordered today are summarized above and listed in the  Patient Instructions accessible in Encounters.   Signed, Christell Faith, PA-C 08/20/2019 10:54 AM     [facility] [address] [place], [place] [address] [phone]

## 2019-08-23 ENCOUNTER — Ambulatory Visit: Payer: Medicare HMO | Admitting: Physician Assistant

## 2019-08-26 ENCOUNTER — Other Ambulatory Visit: Payer: Self-pay

## 2019-08-26 ENCOUNTER — Encounter: Payer: Self-pay | Admitting: Physician Assistant

## 2019-08-26 ENCOUNTER — Ambulatory Visit (INDEPENDENT_AMBULATORY_CARE_PROVIDER_SITE_OTHER): Payer: Medicare HMO | Admitting: Physician Assistant

## 2019-08-26 VITALS — BP 128/70 | HR 86 | Ht 62.0 in | Wt 155.2 lb

## 2019-08-26 DIAGNOSIS — E785 Hyperlipidemia, unspecified: Secondary | ICD-10-CM | POA: Diagnosis not present

## 2019-08-26 DIAGNOSIS — I10 Essential (primary) hypertension: Secondary | ICD-10-CM | POA: Diagnosis not present

## 2019-08-26 DIAGNOSIS — I2510 Atherosclerotic heart disease of native coronary artery without angina pectoris: Secondary | ICD-10-CM | POA: Diagnosis not present

## 2019-08-26 DIAGNOSIS — I5022 Chronic systolic (congestive) heart failure: Secondary | ICD-10-CM | POA: Diagnosis not present

## 2019-08-26 DIAGNOSIS — E119 Type 2 diabetes mellitus without complications: Secondary | ICD-10-CM | POA: Diagnosis not present

## 2019-08-26 NOTE — Patient Instructions (Signed)
Medication Instructions:  No changes  *If you need a refill on your cardiac medications before your next appointment, please call your pharmacy*  Lab Work: None  If you have labs (blood work) drawn today and your tests are completely normal, you will receive your results only by: Marland Kitchen MyChart Message (if you have MyChart) OR . A paper copy in the mail If you have any lab test that is abnormal or we need to change your treatment, we will call you to review the results.  Testing/Procedures: Limited echo ordered for late March Your physician has requested that you have an echocardiogram. Echocardiography is a painless test that uses sound waves to create images of your heart. It provides your doctor with information about the size and shape of your heart and how well your heart's chambers and valves are working. This procedure takes approximately one hour. There are no restrictions for this procedure.    Follow-Up: At [facility], you and your health needs are our priority.  As part of our continuing mission to provide you with exceptional heart care, we have created designated Provider Care Teams.  These Care Teams include your primary Cardiologist (physician) and Advanced Practice Providers (APPs -  Physician Assistants and Nurse Practitioners) who all work together to provide you with the care you need, when you need it.  Your next appointment:   1 month(s)  The format for your next appointment:   In Person  Provider:    You may see Kathlyn Sacramento, MD or one of the following Advanced Practice Providers on your designated Care Team:    Murray Hodgkins, NP  Christell Faith, PA-C  Marrianne Mood, PA-C

## 2019-08-26 NOTE — Progress Notes (Signed)
Office Visit    Patient Name: Janice Liu Date of Encounter: 08/26/2019  Primary Care Provider:  Jearld Fenton, NP Primary Cardiologist:  Kathlyn Sacramento, MD  Chief Complaint    79 y.o. female with history of single vessel CAD with LHC and PCI to mLAD 08/09/2019, HFrEF (EF 40-45%), HTN, HLD, recently diagnosed DM2 (Hgb A1C 7.2), andwho is being seen today for follow-up of NSTEMIs/p LHC with PCI on 08/09/2019.  Past Medical History    Past Medical History:  Diagnosis Date  . Allergy   . CAD (coronary artery disease)   . Chicken pox   . Diabetes mellitus ([place])   . Heart murmur   . HFrEF (heart failure with reduced ejection fraction) ([place])   . Hypertension    Past Surgical History:  Procedure Laterality Date  . CORONARY STENT INTERVENTION N/A 08/09/2019   Procedure: CORONARY STENT INTERVENTION;  Surgeon: Nelva Bush, MD;  Location: [facility];  Service: Cardiovascular;  Laterality: N/A;  . LEFT HEART CATH AND CORONARY ANGIOGRAPHY N/A 08/09/2019   Procedure: LEFT HEART CATH AND CORONARY ANGIOGRAPHY;  Surgeon: Nelva Bush, MD;  Location: [facility];  Service: Cardiovascular;  Laterality: N/A;    Allergies  No Known Allergies  History of Present Illness    Janice Liu is a 79 yo female with history of HTN and HLD. She denies any past history of smoking.  She does not currently drink alcohol.  She reports a family history of heart failure but no family history of early cardiac death. She also reports a h/o atrial fibrillation; however, on review of her chart, she has never been seen for this in the past and is not on any anticoagulation or has any documentation to confirm this diagnosis. In December 2015, she underwent nuclear stress test that was ruled low risk.  In 2016/11/15, her son passed away after a lifetime of cerebral palsy.  She reported increased stress at that time.  She has been followed by [facility] for HTN and last seen 08/19/2016 by her  primary cardiologist and doing well from a cardiac standpoint. During the evening of 08/08/2019, she reported chest pain that was described as centrally located and aching in quality, radiating to her back, lasting approximately 1-2 hours, and rated 9/10 in severity.  The pain was further described as nonpositional and nonpleuritic.  Associated symptoms included some shortness of breath and nausea.  She reported improvement in the chest pain with receipt of nitro and upon arrival in the emergency department.  Initial troponin 1,140  >27,000. She underwent cardiac catheterization that showed high-grade stenosis of mid LAD as well as noncritical disease more proximally in the LAD as well as the RCA.  She underwent successful PCI to the mid LAD.  Left ventriculogram and echocardiogram confirmed moderately reduced left ventricular systolic function with mid and apical anterior wall motion abnormality.  She was continued on medical therapy for noncritical LAD and RCA disease.  DAPT was recommended for at least 12 months.  It was noted that she should continue on rosuvastatin 10 mg daily.  Fenofibrate could also be continued; however, given her elevation of triglycerides, it was recommended she consider adding or switching to Vascepa as an outpatient.  Given her persistent hypokalemia and moderately reduced LVEF, spironolactone 25 mg daily was added.  It was noted potassium should be repleted to maintain a level greater than 3.54.0.  ACE/ARB was deferred given borderline blood pressure.  Carvedilol 25 mg twice daily  was continued.   Since that time, the patient reports that she has been doing well and without any CP or SOB.  She declined cardiac rehab at discharge, reporting today that she lives with her son and has been going up and down the stairs at his place for exercise.  She noted that she has been doing this very carefully, as she realizes the risk of fall.  She declined cardiac rehab again at follow-up.  She  denies any recurrent chest pain.  No racing heart rate or palpitations.  No lower extremity edema, abdominal distention, shortness of breath, dyspnea on exertion, orthopnea, early satiety, or signs/symptoms consistent with acute bleed.  She reports that she is doing well and has more energy.  Her son indicated today that they have ordered a recumbent bike, which should be arriving shortly.  They reported that she will use this once it arrives, as it will be safer than the stairs. She reports a heart healthy diet. She does check her BP at home but did not bring the readings in to this visit but will at her next follow-up. She reports medication compliance.   Home Medications    Prior to Admission medications   Medication Sig Start Date End Date Taking? Authorizing Provider  amLODipine (NORVASC) 5 MG tablet Take 1 tablet (5 mg total) by mouth daily. 03/09/19  Yes Jearld Fenton, NP  aspirin EC 81 MG EC tablet Take 1 tablet (81 mg total) by mouth daily. 08/11/19 09/10/19 Yes Wyvonnia Dusky, MD  calcium carbonate (OS-CAL - DOSED IN MG OF ELEMENTAL CALCIUM) 1250 (500 Ca) MG tablet Take 1 tablet by mouth daily.   Yes [provider]  carvedilol (COREG) 25 MG tablet Take 1 tablet (25 mg total) by mouth 2 (two) times daily. 03/09/19  Yes Jearld Fenton, NP  fenofibrate 54 MG tablet Take 1 tablet (54 mg total) by mouth daily. 07/07/19  Yes Baity, Coralie Keens, NP  Omega-3 Fatty Acids (FISH OIL) 500 MG CAPS Take 500 mg by mouth daily.   Yes [provider]  potassium chloride SA (KLOR-CON M20) 20 MEQ tablet Take 1 tablet (20 mEq total) by mouth 2 (two) times daily. 03/09/19  Yes Baity, Coralie Keens, NP  rosuvastatin (CRESTOR) 10 MG tablet TAKE 1 TABLET BY MOUTH EVERY DAY 08/17/19  Yes Baity, Coralie Keens, NP  spironolactone (ALDACTONE) 25 MG tablet Take 1 tablet (25 mg total) by mouth daily. 08/11/19 09/10/19 Yes Wyvonnia Dusky, MD  ticagrelor (BRILINTA) 90 MG TABS tablet Take 1 tablet (90 mg total)  by mouth 2 (two) times daily. 08/10/19 09/09/19 Yes Wyvonnia Dusky, MD  Vitamin D, Ergocalciferol, (DRISDOL) 1.25 MG (50000 UT) CAPS capsule Take 1 capsule (50,000 Units total) by mouth every 7 (seven) days. 04/16/19  Yes Jearld Fenton, NP    Review of Systems    She denies chest pain, palpitations, dyspnea, pnd, orthopnea, n, v, dizziness, syncope, edema, weight gain, or early satiety.  All other systems reviewed and are otherwise negative except as noted above.  Physical Exam    VS:  BP 128/70 (BP Location: Left Arm, Patient Position: Sitting, Cuff Size: Normal)   Pulse 86   Ht 5\' 2"  (1.575 m)   Wt 155 lb 4 oz (70.4 kg)   SpO2 98%   BMI 28.40 kg/m  , BMI Body mass index is 28.4 kg/m. GEN: Well nourished, well developed, in no acute distress. HEENT: normal. Neck: Supple, no JVD, carotid bruits, or  masses. Cardiac: RRR, no murmurs, rubs, or gallops. No clubbing, cyanosis, edema.  Radials/DP/PT 2+ and equal bilaterally.  Respiratory:  Respirations regular and unlabored, clear to auscultation bilaterally. GI: Soft, nontender, nondistended, BS + x 4. MS: no deformity or atrophy. Skin: warm and dry, no rash. Neuro:  Strength and sensation are intact. Psych: Normal affect.  Accessory Clinical Findings    ECG personally reviewed by me today - NSR, 86bpm, nonspecific ST/T changes - no acute changes.  Filed Weights   08/26/19 1444  Weight: 155 lb 4 oz (70.4 kg)     Echo 08/09/2019 1. Left ventricular ejection fraction, by visual estimation, is 40 to 45%. The left ventricle has moderately decreased function. There is mildly increased left ventricular hypertrophy. 2. There is akinesis of the left ventricular, mid-apical anterior wall, anteroseptal wall and apical segment. 3. Elevated left atrial pressure. 4. Left ventricular diastolic parameters are consistent with Grade I diastolic dysfunction (impaired relaxation). 5. The left ventricle demonstrates regional wall motion  abnormalities. 6. Global right ventricle has low normal systolic function.The right ventricular size is normal. Mildly increased right ventricular wall thickness. 7. Left atrial size was normal. 8. Right atrial size was normal. 9. Mild to moderate mitral annular calcification. 10. The mitral valve is degenerative. Trivial mitral valve regurgitation. No evidence of mitral stenosis. 11. The tricuspid valve is normal in structure. 12. The aortic valve is grossly normal. Aortic valve regurgitation is not visualized. Mild aortic valve sclerosis without stenosis. 13. Pulmonic regurgitation is mild. 14. The pulmonic valve was not well visualized. Pulmonic valve regurgitation is mild. 15. The interatrial septum was not well visualized.   LHC 08/09/2019 Coronary Diagrams  Diagnostic Dominance: Right  Intervention    Conclusions: 1. Severe single-vessel coronary artery disease with 90% ulcerative stenosis in the mid LAD. There is also moderate disease involving the proximal LAD as well as the distal RCA. 2. Moderately reduced left ventricular systolic function with mid/apical anterior hypokinesis and apical akinesis. 3. Mildly elevated left ventricular filling pressure. 4. Successful PCI to the mid LAD using Synergy 2.5 x 16 mm drug-eluting stent with 0% residual stenosis and TIMI-3 flow.  Recommendations: 1. Dual antiplatelet therapy with aspirin and ticagrelor for at least 12 months. 2. Aggressive secondary prevention, including LDL/triglyceride and glycemic control. 3. Continue carvedilol; consider addition of ACE inhibitor or ARB tomorrow to optimize evidence-based heart failure therapy, if renal function and blood pressure allow. 4. Obtain transthoracic echocardiogram.  Assessment & Plan    CAD s/p NSTEMI 07/2019 --No recent CP. S/p LHC showing severe single vessel CAD with ulcerative stenosis mLAD and moderate disease in the pLAD and dRCA. Successful PCI performed to  mLAD. Updated echo with EF reduced 40 to 45%, mild LVH, akinesis as outlined above, elevated left atrial pressure, G1DD, low normal RVSF, increased RV thickness. Recommendation at discharge was to continue medical therapy for noncritical LAD and RCA dz. --Continue DAPT with ASA 81mg  daily and Brilinta 90mg  BID for at least 12 months. Continue BB, statin, spironolactone, and PRN SL nitro. Recommend aggressive secondary prevention, including LDL/triglycerides/glycemic control.  --She reports today that she declines cardiac rehabilitation and instead has been doing her son's home stairs at home. Reviewed that she should be particularly careful with this form of exercise, given risk of fall. Per patient's son, she soon will have a recumbent bike for further exercise, which also is reassuring, given this will reduce risk of fall. As she is not in cardiac rehab, we reviewed that it is  important to continue to increase activity as tolerated at home; therefore, the recumbent bike will be a good addition; however, she should closely monitor her HR and BP and be sure to listen to her body.  -Will order BMET. Patient reports some soft BP at home but did not bring BP log with her today. Given that she is doing stairs at her home to increase activity, hesitant to add an ACE/ARB at this time given increased risk of fall with decreased pressures. Will recheck BMET and have patient bring BP log into next visit, at which time she will also hopefully be on the recumbent bike and addition of ACE/ARB at follow-up can be pursued without concern for hypotension or presyncope on the stairs.      HFrEF (EF 40-45%) --Euvolemic on exam. No SOB. As above, echo with EF 40-45%. Recommend update echo 3-6 months to reassess EF after PCI with limited echo to be scheduled for late March / early April 2021 today. Continue medical management with spironolactone. Recommend recheck BMET today, which will also assist with addition of ACE/ARB if  possible at follow-up as outlined above. Continue spironolactone with recheck of BMET as above.   Hypokalemia --K rechecked after discharge and 3.9 with goal 4.0. Recheck BMET today. Continue spironolactone pending results of BMET.   HTN --Current BP well controlled. Recheck BMET. Consider addition of ACE/ARB at follow-up as outlined above and if stable pressures on follow-up.   HLD --Continue statin. Recommend repeat lipid and liver function at follow-up. As previously mentioned above, consider adding or switching to [facility] as outpatient.   DM2 --Newly diagnosed. Per PCP.   Disposition: Recheck BMET. Consider addition of ACE/ARB at follow-up and once patient is no longer at risk of fall with stairs (and has brought in home BP readings). Recheck liver and lipids at follow-up. Recheck echo approximately March 2021 (scheduled today).    Arvil Chaco, PA-C 08/26/2019, 3:13 PM

## 2019-08-27 ENCOUNTER — Other Ambulatory Visit: Payer: Self-pay | Admitting: Internal Medicine

## 2019-08-27 DIAGNOSIS — E559 Vitamin D deficiency, unspecified: Secondary | ICD-10-CM

## 2019-09-01 ENCOUNTER — Other Ambulatory Visit: Payer: Self-pay | Admitting: Internal Medicine

## 2019-09-01 DIAGNOSIS — E559 Vitamin D deficiency, unspecified: Secondary | ICD-10-CM

## 2019-09-03 ENCOUNTER — Other Ambulatory Visit: Payer: Self-pay | Admitting: Cardiovascular Disease

## 2019-09-03 MED ORDER — TICAGRELOR 90 MG PO TABS: 90.0000 mg | ORAL_TABLET | Freq: Two times a day (BID) | ORAL | 0 refills | Status: AC

## 2019-09-03 NOTE — Telephone Encounter (Signed)
Requested Prescriptions   Signed Prescriptions Disp Refills   ticagrelor (BRILINTA) 90 MG TABS tablet 180 tablet 0    Sig: Take 1 tablet (90 mg total) by mouth 2 (two) times daily.    Authorizing Provider: Marrianne Mood D    Ordering User: Raelene Bott, Greer Koeppen L

## 2019-09-03 NOTE — Telephone Encounter (Signed)
*  STAT* If patient is at the pharmacy, call can be transferred to refill team.   1. Which medications need to be refilled? (please list name of each medication and dose if known) Brilinta 90 mg bid  2. Which pharmacy/location (including street and city if local pharmacy) is medication to be sent to? [facility] [place]  3. Do they need a 30 day or 90 day supply? 55  Originally prescribed from hospital but no refills were alotted

## 2019-09-15 ENCOUNTER — Other Ambulatory Visit: Payer: Self-pay | Admitting: Physician Assistant

## 2019-09-15 NOTE — Telephone Encounter (Signed)
Please advise if ok to refill Aspirin 81 mg tablet qd. Spironolactone 25 mg tablet qd. Last filled by Eppie Gibson, MD hospital.

## 2019-09-30 ENCOUNTER — Other Ambulatory Visit: Payer: Self-pay | Admitting: Internal Medicine

## 2019-10-07 ENCOUNTER — Encounter: Payer: Self-pay | Admitting: Cardiovascular Disease

## 2019-10-07 ENCOUNTER — Other Ambulatory Visit: Payer: Self-pay

## 2019-10-07 ENCOUNTER — Ambulatory Visit: Payer: Medicare HMO | Admitting: Cardiovascular Disease

## 2019-10-07 VITALS — BP 140/70 | HR 86 | Ht 64.0 in | Wt 146.8 lb

## 2019-10-07 DIAGNOSIS — I255 Ischemic cardiomyopathy: Secondary | ICD-10-CM

## 2019-10-07 DIAGNOSIS — I10 Essential (primary) hypertension: Secondary | ICD-10-CM

## 2019-10-07 DIAGNOSIS — I2510 Atherosclerotic heart disease of native coronary artery without angina pectoris: Secondary | ICD-10-CM | POA: Diagnosis not present

## 2019-10-07 DIAGNOSIS — E782 Mixed hyperlipidemia: Secondary | ICD-10-CM | POA: Diagnosis not present

## 2019-10-07 MED ORDER — LOSARTAN POTASSIUM 25 MG PO TABS: 25.0000 mg | ORAL_TABLET | Freq: Every day | ORAL | 5 refills | Status: DC

## 2019-10-07 NOTE — Progress Notes (Signed)
Cardiology Office Note   Date:  10/07/2019   ID:  Janice Liu, DOB [birth year], MRN [id number]  PCP:  Janice Fenton, NP  Cardiologist:   Kathlyn Sacramento, MD   Chief Complaint  Patient presents with  . other    1 month follow up. Meds reviewed by the pt. verbally. "doing well."       History of Present Illness: Janice Liu is a 79 y.o. female who presents for a follow-up visit regarding coronary artery disease and chronic systolic heart failure.  She has chronic medical conditions that include essential hypertension, hyperlipidemia and type 2 diabetes. She presented in December 2020 with non-ST elevation myocardial infarction.  Peak troponin was greater than 27,000.  Cardiac catheterization showed severe one-vessel coronary artery disease with 90% ulcerative stenosis in the mid LAD and moderate proximal LAD and distal RCA disease.  Ejection fraction was 40 to 45% with mildly elevated left ventricular end-diastolic pressure.  She underwent successful angioplasty and drug-eluting stent placement to the mid LAD.  She has been doing well with no recent chest pain, shortness of breath or palpitations.  She takes her medications regularly with no reported side effects. There was some concerns about low blood pressure and dizziness and thus she was not started on an ACE inhibitor or ARB.  However, she has been monitoring her blood pressure at home and most of the time her systolic blood pressure is around 130.  Past Medical History:  Diagnosis Date  . Allergy   . CAD (coronary artery disease)   . Chicken pox   . Diabetes mellitus ([place])   . Heart murmur   . HFrEF (heart failure with reduced ejection fraction) ([place])   . Hypertension     Past Surgical History:  Procedure Laterality Date  . CORONARY STENT INTERVENTION N/A 08/09/2019   Procedure: CORONARY STENT INTERVENTION;  Surgeon: Nelva Bush, MD;  Location: [facility];  Service: Cardiovascular;  Laterality: N/A;    . LEFT HEART CATH AND CORONARY ANGIOGRAPHY N/A 08/09/2019   Procedure: LEFT HEART CATH AND CORONARY ANGIOGRAPHY;  Surgeon: Nelva Bush, MD;  Location: [facility];  Service: Cardiovascular;  Laterality: N/A;     Current Outpatient Medications  Medication Sig Dispense Refill  . amLODipine (NORVASC) 5 MG tablet Take 1 tablet (5 mg total) by mouth daily. 90 tablet 3  . ASPIRIN LOW DOSE 81 MG EC tablet TAKE 1 TABLET BY MOUTH EVERY DAY 30 tablet 0  . calcium carbonate (OS-CAL - DOSED IN MG OF ELEMENTAL CALCIUM) 1250 (500 Ca) MG tablet Take 1 tablet by mouth daily.    . carvedilol (COREG) 25 MG tablet Take 1 tablet (25 mg total) by mouth 2 (two) times daily. 180 tablet 3  . fenofibrate 54 MG tablet TAKE 1 TABLET BY MOUTH DAILY 30 tablet 0  . Omega-3 Fatty Acids (FISH OIL) 500 MG CAPS Take 500 mg by mouth daily.    . potassium chloride SA (KLOR-CON M20) 20 MEQ tablet Take 1 tablet (20 mEq total) by mouth 2 (two) times daily. 180 tablet 3  . rosuvastatin (CRESTOR) 10 MG tablet TAKE 1 TABLET BY MOUTH EVERY DAY 90 tablet 0  . spironolactone (ALDACTONE) 25 MG tablet TAKE 1 TABLET BY MOUTH EVERY DAY 30 tablet 0  . ticagrelor (BRILINTA) 90 MG TABS tablet Take by mouth 2 (two) times daily.    . Vitamin D, Ergocalciferol, (DRISDOL) 1.25 MG (50000 UT) CAPS capsule Take 1 capsule (50,000 Units  total) by mouth every 7 (seven) days. (Patient not taking: Reported on 10/07/2019) 12 capsule 0   No current facility-administered medications for this visit.    Allergies:   Patient has no known allergies.    Social History:  The patient  reports that she has never smoked. She has never used smokeless tobacco. She reports that she does not drink alcohol or use drugs.   Family History:  The patient's family history includes Cerebral palsy in her son; Congestive Heart Failure in her brother and brother; Diabetes in her mother; Heart failure in her mother; Hypertension in her brother and mother.     ROS:  Please see the history of present illness.   Otherwise, review of systems are positive for none.   All other systems are reviewed and negative.    PHYSICAL EXAM: VS:  BP 140/70 (BP Location: Left Arm, Patient Position: Sitting, Cuff Size: Normal)   Pulse 86   Ht 5\' 4"  (1.626 m)   Wt 146 lb 12 oz (66.6 kg)   SpO2 98%   BMI 25.19 kg/m  , BMI Body mass index is 25.19 kg/m. GEN: Well nourished, well developed, in no acute distress  HEENT: normal  Neck: no JVD, carotid bruits, or masses Cardiac: RRR; no murmurs, rubs, or gallops,no edema  Respiratory:  clear to auscultation bilaterally, normal work of breathing GI: soft, nontender, nondistended, + BS MS: no deformity or atrophy  Skin: warm and dry, no rash Neuro:  Strength and sensation are intact Psych: euthymic mood, full affect   EKG:  EKG is ordered today. The ekg ordered today demonstrates normal sinus rhythm with poor R wave progression in the anterior leads suggestive of prior myocardial infarction.   Recent Labs: 07/01/2019: ALT 18 08/09/2019: Magnesium 2.0; TSH 1.892 08/10/2019: BUN 9; Creatinine, Ser 0.75; Hemoglobin 12.2; Platelets 350; Potassium 3.9; Sodium 140    Lipid Panel    Component Value Date/Time   CHOL 114 08/10/2019 0506   CHOL 274 (H) 08/15/2015 0803   TRIG 175 (H) 08/10/2019 0506   HDL 41 08/10/2019 0506   HDL 38 (L) 08/15/2015 0803   CHOLHDL 2.8 08/10/2019 0506   VLDL 35 08/10/2019 0506   LDLCALC 38 08/10/2019 0506   LDLCALC 184 (H) 08/15/2015 0803   LDLDIRECT 87.0 07/01/2019 0916      Wt Readings from Last 3 Encounters:  10/07/19 146 lb 12 oz (66.6 kg)  08/26/19 155 lb 4 oz (70.4 kg)  08/09/19 160 lb 7.9 oz (72.8 kg)       ASSESSMENT AND PLAN:  1.  Coronary artery disease involving native coronary arteries without angina: She is doing extremely well at the present time after LAD PCI in December.  Recommend continuing dual antiplatelet therapy at least until December 2021.   Continue aggressive treatment of risk factors.   2.  Chronic systolic heart failure due to ischemic cardiomyopathy: Ejection fraction was 40 to 45%.  Continue treatment with carvedilol and spironolactone.  I added losartan 25 mg once daily today.  Check basic metabolic profile in 1 week.  She is coming for a repeat limited echocardiogram next month to evaluate ejection fraction.   3.  Essential hypertension: Blood pressure is mildly elevated.  Losartan was added out as outlined above.  4.  Hyperlipidemia: Currently on rosuvastatin 10 mg daily.  Lipid profile in December showed an LDL of 38 and triglyceride of 175.  She is on fenofibrate and fish oil.  5.  Diabetes mellitus: Managed by her  primary care physician.  Given her cardiovascular disease, consider treatment with Jardiance.   Disposition:   Follow-up in 3 months.   Signed,  Kathlyn Sacramento, MD  10/07/2019 3:48 PM    [place]

## 2019-10-07 NOTE — Patient Instructions (Signed)
Medication Instructions:  Your physician has recommended you make the following change in your medication:   START Losartan 25 mg daily. An Rx has been sent to your pharmacy.  *If you need a refill on your cardiac medications before your next appointment, please call your pharmacy*  Lab Work: Your physician recommends that you return for lab work in: 1 week.  Please have your lab work (bmet) drawn at the [facility].    If you have labs (blood work) drawn today and your tests are completely normal, you will receive your results only by: Marland Kitchen MyChart Message (if you have MyChart) OR . A paper copy in the mail If you have any lab test that is abnormal or we need to change your treatment, we will call you to review the results.  Testing/Procedures: None ordered  Follow-Up: At [facility], you and your health needs are our priority.  As part of our continuing mission to provide you with exceptional heart care, we have created designated Provider Care Teams.  These Care Teams include your primary Cardiologist (physician) and Advanced Practice Providers (APPs -  Physician Assistants and Nurse Practitioners) who all work together to provide you with the care you need, when you need it.  Your next appointment:   3 month(s)  The format for your next appointment:   In Person  Provider:    You may see Kathlyn Sacramento, MD or one of the following Advanced Practice Providers on your designated Care Team:    Murray Hodgkins, NP  Christell Faith, PA-C  Marrianne Mood, PA-C   Other Instructions N/A

## 2019-10-08 ENCOUNTER — Other Ambulatory Visit: Payer: Self-pay | Admitting: Cardiovascular Disease

## 2019-10-14 ENCOUNTER — Other Ambulatory Visit: Payer: Self-pay

## 2019-10-14 ENCOUNTER — Ambulatory Visit (INDEPENDENT_AMBULATORY_CARE_PROVIDER_SITE_OTHER): Payer: Medicare HMO | Admitting: Internal Medicine

## 2019-10-14 ENCOUNTER — Encounter: Payer: Self-pay | Admitting: Internal Medicine

## 2019-10-14 VITALS — BP 124/68 | HR 54 | Temp 97.9°F | Wt 145.0 lb

## 2019-10-14 DIAGNOSIS — I10 Essential (primary) hypertension: Secondary | ICD-10-CM

## 2019-10-14 DIAGNOSIS — I214 Non-ST elevation (NSTEMI) myocardial infarction: Secondary | ICD-10-CM | POA: Diagnosis not present

## 2019-10-14 DIAGNOSIS — I5022 Chronic systolic (congestive) heart failure: Secondary | ICD-10-CM | POA: Diagnosis not present

## 2019-10-14 DIAGNOSIS — E7800 Pure hypercholesterolemia, unspecified: Secondary | ICD-10-CM

## 2019-10-14 DIAGNOSIS — E119 Type 2 diabetes mellitus without complications: Secondary | ICD-10-CM | POA: Diagnosis not present

## 2019-10-14 DIAGNOSIS — I255 Ischemic cardiomyopathy: Secondary | ICD-10-CM

## 2019-10-14 LAB — COMPREHENSIVE METABOLIC PANEL
ALT: 12 U/L (ref 0–35)
AST: 16 U/L (ref 0–37)
Albumin: 4 g/dL (ref 3.5–5.2)
Alkaline Phosphatase: 41 U/L (ref 39–117)
BUN: 25 mg/dL — ABNORMAL HIGH (ref 6–23)
CO2: 25 mEq/L (ref 19–32)
Calcium: 9.5 mg/dL (ref 8.4–10.5)
Chloride: 105 mEq/L (ref 96–112)
Creatinine, Ser: 1.12 mg/dL (ref 0.40–1.20)
GFR: 46.94 mL/min — ABNORMAL LOW (ref >60.00)
Glucose, Bld: 124 mg/dL — ABNORMAL HIGH (ref 70–99)
Potassium: 5 mEq/L (ref 3.5–5.1)
Sodium: 137 mEq/L (ref 135–145)
Total Bilirubin: 0.4 mg/dL (ref 0.2–1.2)
Total Protein: 6.9 g/dL (ref 6.0–8.3)

## 2019-10-14 LAB — LIPID PANEL
Cholesterol: 118 mg/dL (ref 0–200)
HDL: 42.6 mg/dL (ref >39.00)
LDL Cholesterol: 45 mg/dL (ref 0–99)
NonHDL: 75.22
Total CHOL/HDL Ratio: 3
Triglycerides: 153 mg/dL — ABNORMAL HIGH (ref 0.0–149.0)
VLDL: 30.6 mg/dL (ref 0.0–40.0)

## 2019-10-14 LAB — HEMOGLOBIN A1C: Hgb A1c MFr Bld: 5.4 % (ref 4.6–6.5)

## 2019-10-14 LAB — CBC
HCT: 25.9 % — ABNORMAL LOW (ref 36.0–46.0)
Hemoglobin: 8.5 g/dL — ABNORMAL LOW (ref 12.0–15.0)
MCHC: 33 g/dL (ref 30.0–36.0)
MCV: 86.8 fl (ref 78.0–100.0)
Platelets: 561 10*3/uL — ABNORMAL HIGH (ref 150.0–400.0)
RBC: 2.98 Mil/uL — ABNORMAL LOW (ref 3.87–5.11)
RDW: 14.7 % (ref 11.5–15.5)
WBC: 8.6 10*3/uL (ref 4.0–10.5)

## 2019-10-14 LAB — VITAMIN D 25 HYDROXY (VIT D DEFICIENCY, FRACTURES): VITD: 55.96 ng/mL (ref 30.00–100.00)

## 2019-10-14 NOTE — Assessment & Plan Note (Signed)
CMET and Lipid profile today Encouraged her to consume a low fat diet Continue Rosuvastatin and Fenofibrate

## 2019-10-14 NOTE — Assessment & Plan Note (Signed)
Now that she is on Losartan, wonder if she can come off Amlodipine- will discuss with cardiology CMET today Continue Carvedilol, Amlodipine, and Losartan for now Reinforced DASH diet

## 2019-10-14 NOTE — Patient Instructions (Signed)
Fat and Cholesterol Restricted Eating Plan Getting too much fat and cholesterol in your diet may cause health problems. Choosing the right foods helps keep your fat and cholesterol at normal levels. This can keep you from getting certain diseases. Your doctor may recommend an eating plan that includes:  Total fat: ______% or less of total calories a day.  Saturated fat: ______% or less of total calories a day.  Cholesterol: less than _________mg a day.  Fiber: ______g a day. What are tips for following this plan? Meal planning  At meals, divide your plate into four equal parts: ? Fill one-half of your plate with vegetables and green salads. ? Fill one-fourth of your plate with whole grains. ? Fill one-fourth of your plate with low-fat (lean) protein foods.  Eat fish that is high in omega-3 fats at least two times a week. This includes mackerel, tuna, sardines, and salmon.  Eat foods that are high in fiber, such as whole grains, beans, apples, broccoli, carrots, peas, and barley. General tips   Work with your doctor to lose weight if you need to.  Avoid: ? Foods with added sugar. ? Fried foods. ? Foods with partially hydrogenated oils.  Limit alcohol intake to no more than 1 drink a day for nonpregnant women and 2 drinks a day for men. One drink equals 12 oz of beer, 5 oz of wine, or 1 oz of hard liquor. Reading food labels  Check food labels for: ? Trans fats. ? Partially hydrogenated oils. ? Saturated fat (g) in each serving. ? Cholesterol (mg) in each serving. ? Fiber (g) in each serving.  Choose foods with healthy fats, such as: ? Monounsaturated fats. ? Polyunsaturated fats. ? Omega-3 fats.  Choose grain products that have whole grains. Look for the word "whole" as the first word in the ingredient list. Cooking  Cook foods using low-fat methods. These include baking, boiling, grilling, and broiling.  Eat more home-cooked foods. Eat at restaurants and buffets  less often.  Avoid cooking using saturated fats, such as butter, cream, palm oil, palm kernel oil, and coconut oil. Recommended foods  Fruits  All fresh, canned (in natural juice), or frozen fruits. Vegetables  Fresh or frozen vegetables (raw, steamed, roasted, or grilled). Green salads. Grains  Whole grains, such as whole wheat or whole grain breads, crackers, cereals, and pasta. Unsweetened oatmeal, bulgur, barley, quinoa, or brown rice. Corn or whole wheat flour tortillas. Meats and other protein foods  Ground beef (85% or leaner), grass-fed beef, or beef trimmed of fat. Skinless chicken or turkey. Ground chicken or turkey. Pork trimmed of fat. All fish and seafood. Egg whites. Dried beans, peas, or lentils. Unsalted nuts or seeds. Unsalted canned beans. Nut butters without added sugar or oil. Dairy  Low-fat or nonfat dairy products, such as skim or 1% milk, 2% or reduced-fat cheeses, low-fat and fat-free ricotta or cottage cheese, or plain low-fat and nonfat yogurt. Fats and oils  Tub margarine without trans fats. Light or reduced-fat mayonnaise and salad dressings. Avocado. Olive, canola, sesame, or safflower oils. The items listed above may not be a complete list of foods and beverages you can eat. Contact a dietitian for more information. Foods to avoid Fruits  Canned fruit in heavy syrup. Fruit in cream or butter sauce. Fried fruit. Vegetables  Vegetables cooked in cheese, cream, or butter sauce. Fried vegetables. Grains  White bread. White pasta. White rice. Cornbread. Bagels, pastries, and croissants. Crackers and snack foods that contain trans fat   and hydrogenated oils. Meats and other protein foods  Fatty cuts of meat. Ribs, chicken wings, bacon, sausage, bologna, salami, chitterlings, fatback, hot dogs, bratwurst, and packaged lunch meats. Liver and organ meats. Whole eggs and egg yolks. Chicken and turkey with skin. Fried meat. Dairy  Whole or 2% milk, cream,  half-and-half, and cream cheese. Whole milk cheeses. Whole-fat or sweetened yogurt. Full-fat cheeses. Nondairy creamers and whipped toppings. Processed cheese, cheese spreads, and cheese curds. Beverages  Alcohol. Sugar-sweetened drinks such as sodas, lemonade, and fruit drinks. Fats and oils  Butter, stick margarine, lard, shortening, ghee, or bacon fat. Coconut, palm kernel, and palm oils. Sweets and desserts  Corn syrup, sugars, honey, and molasses. Candy. Jam and jelly. Syrup. Sweetened cereals. Cookies, pies, cakes, donuts, muffins, and ice cream. The items listed above may not be a complete list of foods and beverages you should avoid. Contact a dietitian for more information. Summary  Choosing the right foods helps keep your fat and cholesterol at normal levels. This can keep you from getting certain diseases.  At meals, fill one-half of your plate with vegetables and green salads.  Eat high-fiber foods, like whole grains, beans, apples, carrots, peas, and barley.  Limit added sugar, saturated fats, alcohol, and fried foods. This information is not intended to replace advice given to you by your health care provider. Make sure you discuss any questions you have with your health care provider. Document Revised: 04/01/2018 Document Reviewed: 04/15/2017 Elsevier Patient Education  2020 [employer]  

## 2019-10-14 NOTE — Assessment & Plan Note (Signed)
A1C today No urine microalbumin secondary to ARB therapy Encouraged her to consume a low carb diet Foot exam today She declines referral for eye exam Flu shot UTD She declines pneumonia vaccines

## 2019-10-14 NOTE — Assessment & Plan Note (Signed)
Continue Carvedilol, Losartan, Amlodipine, Spironolactone and Potassium I am concerned with her taking the Potassium with Losartan and Spironolactone CMET today Encouraged low salt diet and daily weights.

## 2019-10-14 NOTE — Assessment & Plan Note (Signed)
No chest pain Continue Carvedilol, Amlodipine, Losartan, Rosuvastatin, Fenofibrate, Brillinta and ASA  Continue to follow with cardiology CBC, CMET and Lipid profile today

## 2019-10-14 NOTE — Progress Notes (Signed)
Subjective:    Patient ID: Janice Liu, female    DOB: [birth year], 79 y.o.   MRN: [id number]  HPI  HLD with CAD, s/p MI: She denies angina.  Her last LDL was 38, triglycerides 175, 07/2019.  She denies myalgias on Rosuvastatin.  She is taking Fenofibrate as well.  She is on Brilinta and Aspirin for anticoagulation status post stenting.  She follows with cardiology.  DM 2: Her last A1c was 7.2%, 06/2019.  She is not taking any oral diabetic medication at this time.  Her sugars range 90-130. She has lost 20 lbs.  She checks her feet routinely.  Her last eye exam was more than 2 years ago.  Flu: 05/2019 Pneumovax: never. Prevnar: never.  HTN: Her BP today is 124/68.  She is taking Amlodipine, Losartan, Carvedilol and Spironolactone as prescribed.  ECG from 09/2019 reviewed.  CHF, Systolic: Compensated. She is taking Carvedilol, Amlodipine, Losartan, Spironolactone and Potassium as prescribed. Echo from 07/2019 reviewed.  Review of Systems  Past Medical History:  Diagnosis Date  . Allergy   . CAD (coronary artery disease)   . Chicken pox   . Diabetes mellitus ([place])   . Heart murmur   . HFrEF (heart failure with reduced ejection fraction) ([place])   . Hypertension     Current Outpatient Medications  Medication Sig Dispense Refill  . amLODipine (NORVASC) 5 MG tablet Take 1 tablet (5 mg total) by mouth daily. 90 tablet 3  . ASPIRIN LOW DOSE 81 MG EC tablet TAKE 1 TABLET BY MOUTH EVERY DAY 30 tablet 5  . calcium carbonate (OS-CAL - DOSED IN MG OF ELEMENTAL CALCIUM) 1250 (500 Ca) MG tablet Take 1 tablet by mouth daily.    . carvedilol (COREG) 25 MG tablet Take 1 tablet (25 mg total) by mouth 2 (two) times daily. 180 tablet 3  . fenofibrate 54 MG tablet TAKE 1 TABLET BY MOUTH DAILY 30 tablet 0  . losartan (COZAAR) 25 MG tablet Take 1 tablet (25 mg total) by mouth daily. 30 tablet 5  . Omega-3 Fatty Acids (FISH OIL) 500 MG CAPS Take 500 mg by mouth daily.    . potassium chloride SA  (KLOR-CON M20) 20 MEQ tablet Take 1 tablet (20 mEq total) by mouth 2 (two) times daily. 180 tablet 3  . rosuvastatin (CRESTOR) 10 MG tablet TAKE 1 TABLET BY MOUTH EVERY DAY 90 tablet 0  . spironolactone (ALDACTONE) 25 MG tablet TAKE 1 TABLET BY MOUTH EVERY DAY 30 tablet 5  . ticagrelor (BRILINTA) 90 MG TABS tablet Take by mouth 2 (two) times daily.    . Vitamin D, Ergocalciferol, (DRISDOL) 1.25 MG (50000 UT) CAPS capsule Take 1 capsule (50,000 Units total) by mouth every 7 (seven) days. (Patient not taking: Reported on 10/07/2019) 12 capsule 0   No current facility-administered medications for this visit.    No Known Allergies  Family History  Problem Relation Age of Onset  . Heart failure Mother   . Diabetes Mother   . Hypertension Mother   . Congestive Heart Failure Brother   . Hypertension Brother   . Cerebral palsy Son   . Congestive Heart Failure Brother     Social History   Socioeconomic History  . Marital status: Widowed    Spouse name: Not on file  . Number of children: Not on file  . Years of education: Not on file  . Highest education level: Not on file  Occupational History  . Not on file  Tobacco Use  . Smoking status: Never Smoker  . Smokeless tobacco: Never Used  Substance and Sexual Activity  . Alcohol use: No    Alcohol/week: 0.0 standard drinks  . Drug use: No  . Sexual activity: Never  Other Topics Concern  . Not on file  Social History Narrative  . Not on file   Social Determinants of Health   Financial Resource Strain:   . Difficulty of Paying Living Expenses: Not on file  Food Insecurity:   . Worried About Charity fundraiser in the Last Year: Not on file  . Ran Out of Food in the Last Year: Not on file  Transportation Needs:   . Lack of Transportation (Medical): Not on file  . Lack of Transportation (Non-Medical): Not on file  Physical Activity:   . Days of Exercise per Week: Not on file  . Minutes of Exercise per Session: Not on file    Stress:   . Feeling of Stress : Not on file  Social Connections:   . Frequency of Communication with Friends and Family: Not on file  . Frequency of Social Gatherings with Friends and Family: Not on file  . Attends Religious Services: Not on file  . Active Member of Clubs or Organizations: Not on file  . Attends Archivist Meetings: Not on file  . Marital Status: Not on file  Intimate Partner Violence:   . Fear of Current or Ex-Partner: Not on file  . Emotionally Abused: Not on file  . Physically Abused: Not on file  . Sexually Abused: Not on file     Constitutional: Denies fever, malaise, fatigue, headache or abrupt weight changes.  Respiratory: Denies difficulty breathing, shortness of breath, cough or sputum production.   Cardiovascular: Denies chest pain, chest tightness, palpitations or swelling in the hands or feet.  Skin: Denies redness, rashes, lesions or ulcercations.  Neurological: Denies dizziness, difficulty with memory, difficulty with speech or problems with balance and coordination.    No other specific complaints in a complete review of systems (except as listed in HPI above).      Objective:   Physical Exam BP 124/68   Pulse (!) 54   Temp 97.9 F (36.6 C) (Temporal)   Wt 145 lb (65.8 kg)   SpO2 98%   BMI 24.89 kg/m   Wt Readings from Last 3 Encounters:  10/07/19 146 lb 12 oz (66.6 kg)  08/26/19 155 lb 4 oz (70.4 kg)  08/09/19 160 lb 7.9 oz (72.8 kg)    General: Appears her stated age, well developed, well nourished in NAD. Skin: Warm, dry and intact. No ulcerations noted. Cardiovascular: Bradycardic with normal rhythm. S1,S2 noted.  No murmur, rubs or gallops noted. No JVD or BLE edema. No carotid bruits noted. Pulmonary/Chest: Normal effort and positive vesicular breath sounds. No respiratory distress. No wheezes, rales or ronchi noted.  Musculoskeletal: No difficulty with gait.  Neurological: Alert and oriented. Decreased sensation of  bilateral heels.    BMET    Component Value Date/Time   NA 140 08/10/2019 0506   NA 140 08/15/2015 0803   K 3.9 08/10/2019 1556   CL 107 08/10/2019 0506   CO2 23 08/10/2019 0506   GLUCOSE 114 (H) 08/10/2019 0506   BUN 9 08/10/2019 0506   BUN 11 08/15/2015 0803   CREATININE 0.75 08/10/2019 0506   CALCIUM 8.6 (L) 08/10/2019 0506   GFRNONAA >60 08/10/2019 0506   GFRAA >60 08/10/2019 0506    Lipid Panel  Component Value Date/Time   CHOL 114 08/10/2019 0506   CHOL 274 (H) 08/15/2015 0803   TRIG 175 (H) 08/10/2019 0506   HDL 41 08/10/2019 0506   HDL 38 (L) 08/15/2015 0803   CHOLHDL 2.8 08/10/2019 0506   VLDL 35 08/10/2019 0506   LDLCALC 38 08/10/2019 0506   LDLCALC 184 (H) 08/15/2015 0803    CBC    Component Value Date/Time   WBC 11.1 (H) 08/10/2019 0506   RBC 4.29 08/10/2019 0506   HGB 12.2 08/10/2019 0506   HCT 35.9 (L) 08/10/2019 0506   PLT 350 08/10/2019 0506   MCV 83.7 08/10/2019 0506   MCH 28.4 08/10/2019 0506   MCHC 34.0 08/10/2019 0506   RDW 13.4 08/10/2019 0506    Hgb A1C Lab Results  Component Value Date   HGBA1C 7.2 (H) 07/01/2019             Assessment & Plan:    Janice Silversmith, NP This visit occurred during the SARS-CoV-2 public health emergency.  Safety protocols were in place, including screening questions prior to the visit, additional usage of staff PPE, and extensive cleaning of exam room while observing appropriate contact time as indicated for disinfecting solutions.

## 2019-10-14 NOTE — Assessment & Plan Note (Signed)
Continue Carvedilol, Amlodipine, Losartan, Rosuvastatin, Fenofibrate, Brillinta and ASA  Continue to follow with cardiology CBC, CMET and Lipid profile today

## 2019-10-15 ENCOUNTER — Ambulatory Visit
Admission: RE | Admit: 2019-10-15 | Discharge: 2019-10-15 | Disposition: A | Discharge status: Home / Self Care | Payer: Medicare HMO | Source: Ambulatory Visit | Attending: Internal Medicine | Admitting: Internal Medicine

## 2019-10-15 ENCOUNTER — Encounter: Payer: Self-pay | Admitting: Internal Medicine

## 2019-10-15 ENCOUNTER — Other Ambulatory Visit: Payer: Self-pay | Admitting: Internal Medicine

## 2019-10-15 ENCOUNTER — Ambulatory Visit (INDEPENDENT_AMBULATORY_CARE_PROVIDER_SITE_OTHER): Payer: Medicare HMO | Admitting: Internal Medicine

## 2019-10-15 VITALS — BP 116/68 | HR 80 | Temp 96.9°F | Wt 145.0 lb

## 2019-10-15 DIAGNOSIS — D649 Anemia, unspecified: Secondary | ICD-10-CM | POA: Diagnosis not present

## 2019-10-15 DIAGNOSIS — N390 Urinary tract infection, site not specified: Secondary | ICD-10-CM | POA: Diagnosis not present

## 2019-10-15 DIAGNOSIS — R42 Dizziness and giddiness: Secondary | ICD-10-CM

## 2019-10-15 DIAGNOSIS — E538 Deficiency of other specified B group vitamins: Secondary | ICD-10-CM

## 2019-10-15 DIAGNOSIS — N6310 Unspecified lump in the right breast, unspecified quadrant: Secondary | ICD-10-CM

## 2019-10-15 DIAGNOSIS — K5730 Diverticulosis of large intestine without perforation or abscess without bleeding: Secondary | ICD-10-CM | POA: Diagnosis not present

## 2019-10-15 DIAGNOSIS — R231 Pallor: Secondary | ICD-10-CM

## 2019-10-15 DIAGNOSIS — D500 Iron deficiency anemia secondary to blood loss (chronic): Secondary | ICD-10-CM

## 2019-10-15 DIAGNOSIS — K921 Melena: Secondary | ICD-10-CM | POA: Diagnosis not present

## 2019-10-15 LAB — POC URINALSYSI DIPSTICK (AUTOMATED)
Bilirubin, UA: NEGATIVE
Blood, UA: NEGATIVE
Glucose, UA: NEGATIVE
Ketones, UA: NEGATIVE
Nitrite, UA: POSITIVE
Protein, UA: NEGATIVE
Spec Grav, UA: 1.015 (ref 1.010–1.025)
Urobilinogen, UA: 0.2 E.U./dL
pH, UA: 7.5 (ref 5.0–8.0)

## 2019-10-15 LAB — CBC WITH DIFFERENTIAL/PLATELET
Basophils Absolute: 0 10*3/uL (ref 0.0–0.1)
Basophils Relative: 0.4 % (ref 0.0–3.0)
Eosinophils Absolute: 0.2 10*3/uL (ref 0.0–0.7)
Eosinophils Relative: 2.4 % (ref 0.0–5.0)
HCT: 24.6 % — ABNORMAL LOW (ref 36.0–46.0)
Hemoglobin: 8.3 g/dL — ABNORMAL LOW (ref 12.0–15.0)
Lymphocytes Relative: 29.6 % (ref 12.0–46.0)
Lymphs Abs: 2.9 10*3/uL (ref 0.7–4.0)
MCHC: 33.5 g/dL (ref 30.0–36.0)
MCV: 85.8 fl (ref 78.0–100.0)
Monocytes Absolute: 0.7 10*3/uL (ref 0.1–1.0)
Monocytes Relative: 6.8 % (ref 3.0–12.0)
Neutro Abs: 6 10*3/uL (ref 1.4–7.7)
Neutrophils Relative %: 60.8 % (ref 43.0–77.0)
Platelets: 546 10*3/uL — ABNORMAL HIGH (ref 150.0–400.0)
RBC: 2.87 Mil/uL — ABNORMAL LOW (ref 3.87–5.11)
RDW: 14.8 % (ref 11.5–15.5)
WBC: 9.9 10*3/uL (ref 4.0–10.5)

## 2019-10-15 LAB — FERRITIN: Ferritin: 12 ng/mL (ref 10.0–291.0)

## 2019-10-15 LAB — VITAMIN B12: Vitamin B-12: 97 pg/mL — ABNORMAL LOW (ref 211–911)

## 2019-10-15 LAB — IBC PANEL
Iron: 29 ug/dL — ABNORMAL LOW (ref 42–145)
Saturation Ratios: 5.8 % — ABNORMAL LOW (ref 20.0–50.0)
Transferrin: 356 mg/dL (ref 212.0–360.0)

## 2019-10-15 LAB — FOLATE: Folate: 7 ng/mL (ref >5.9)

## 2019-10-15 MED ORDER — IRON 325 (65 FE) MG PO TABS: 1.0000 | ORAL_TABLET | Freq: Every day | ORAL | 2 refills | Status: DC

## 2019-10-15 MED ORDER — IOHEXOL 300 MG/ML  SOLN
80.0000 mL | Freq: Once | INTRAMUSCULAR | Status: AC | PRN
  Administered 2019-10-15: 80 mL via INTRAVENOUS

## 2019-10-15 NOTE — Patient Instructions (Signed)
Anemia  Anemia is a condition in which you do not have enough red blood cells or hemoglobin. Hemoglobin is a substance in red blood cells that carries oxygen. When you do not have enough red blood cells or hemoglobin (are anemic), your body cannot get enough oxygen and your organs may not work properly. As a result, you may feel very tired or have other problems. What are the causes? Common causes of anemia include:  Excessive bleeding. Anemia can be caused by excessive bleeding inside or outside the body, including bleeding from the intestine or from periods in women.  Poor nutrition.  Long-lasting (chronic) kidney, thyroid, and liver disease.  Bone marrow disorders.  Cancer and treatments for cancer.  HIV (human immunodeficiency virus) and AIDS (acquired immunodeficiency syndrome).  Treatments for HIV and AIDS.  Spleen problems.  Blood disorders.  Infections, medicines, and autoimmune disorders that destroy red blood cells. What are the signs or symptoms? Symptoms of this condition include:  Minor weakness.  Dizziness.  Headache.  Feeling heartbeats that are irregular or faster than normal (palpitations).  Shortness of breath, especially with exercise.  Paleness.  Cold sensitivity.  Indigestion.  Nausea.  Difficulty sleeping.  Difficulty concentrating. Symptoms may occur suddenly or develop slowly. If your anemia is mild, you may not have symptoms. How is this diagnosed? This condition is diagnosed based on:  Blood tests.  Your medical history.  A physical exam.  Bone marrow biopsy. Your health care provider may also check your stool (feces) for blood and may do additional testing to look for the cause of your bleeding. You may also have other tests, including:  Imaging tests, such as a CT scan or MRI.  Endoscopy.  Colonoscopy. How is this treated? Treatment for this condition depends on the cause. If you continue to lose a lot of blood, you may  need to be treated at a hospital. Treatment may include:  Taking supplements of iron, vitamin S31, or folic acid.  Taking a hormone medicine (erythropoietin) that can help to stimulate red blood cell growth.  Having a blood transfusion. This may be needed if you lose a lot of blood.  Making changes to your diet.  Having surgery to remove your spleen. Follow these instructions at home:  Take over-the-counter and prescription medicines only as told by your health care provider.  Take supplements only as told by your health care provider.  Follow any diet instructions that you were given.  Keep all follow-up visits as told by your health care provider. This is important. Contact a health care provider if:  You develop new bleeding anywhere in the body. Get help right away if:  You are very weak.  You are short of breath.  You have pain in your abdomen or chest.  You are dizzy or feel faint.  You have trouble concentrating.  You have bloody or black, tarry stools.  You vomit repeatedly or you vomit up blood. Summary  Anemia is a condition in which you do not have enough red blood cells or enough of a substance in your red blood cells that carries oxygen (hemoglobin).  Symptoms may occur suddenly or develop slowly.  If your anemia is mild, you may not have symptoms.  This condition is diagnosed with blood tests as well as a medical history and physical exam. Other tests may be needed.  Treatment for this condition depends on the cause of the anemia. This information is not intended to replace advice given to you by  your health care provider. Make sure you discuss any questions you have with your health care provider. Document Revised: 07/11/2017 Document Reviewed: 08/30/2016 Elsevier Patient Education  [employer].

## 2019-10-15 NOTE — Progress Notes (Signed)
Subjective:    Patient ID: Janice Liu, female    DOB: November 12, 1940, 79 y.o.   MRN: [id number]  HPI  Pt presents to the clinic today for follow up labs. Her most recent CBC showed an H/H of 8.5/25.9. She is on ASA, Brillinta and has taken an occasional Ibuprofen. She reports dizziness intermittently upon standing, pale skin and a change in the color of her stools. She has not had any uterine bleeding or blood in the urine. She has never been anemic in the past. She has never had a colonoscopy.  Review of Systems      Past Medical History:  Diagnosis Date  . Allergy   . CAD (coronary artery disease)   . Chicken pox   . Diabetes mellitus ([place])   . Heart murmur   . HFrEF (heart failure with reduced ejection fraction) ([place])   . Hypertension     Current Outpatient Medications  Medication Sig Dispense Refill  . amLODipine (NORVASC) 5 MG tablet Take 1 tablet (5 mg total) by mouth daily. 90 tablet 3  . ASPIRIN LOW DOSE 81 MG EC tablet TAKE 1 TABLET BY MOUTH EVERY DAY 30 tablet 5  . calcium carbonate (OS-CAL - DOSED IN MG OF ELEMENTAL CALCIUM) 1250 (500 Ca) MG tablet Take 1 tablet by mouth daily.    . carvedilol (COREG) 25 MG tablet Take 1 tablet (25 mg total) by mouth 2 (two) times daily. 180 tablet 3  . fenofibrate 54 MG tablet TAKE 1 TABLET BY MOUTH DAILY 30 tablet 0  . losartan (COZAAR) 25 MG tablet Take 1 tablet (25 mg total) by mouth daily. 30 tablet 5  . Omega-3 Fatty Acids (FISH OIL) 500 MG CAPS Take 500 mg by mouth daily.    . potassium chloride SA (KLOR-CON M20) 20 MEQ tablet Take 1 tablet (20 mEq total) by mouth 2 (two) times daily. 180 tablet 3  . rosuvastatin (CRESTOR) 10 MG tablet TAKE 1 TABLET BY MOUTH EVERY DAY 90 tablet 0  . spironolactone (ALDACTONE) 25 MG tablet TAKE 1 TABLET BY MOUTH EVERY DAY 30 tablet 5  . ticagrelor (BRILINTA) 90 MG TABS tablet Take by mouth 2 (two) times daily.     No current facility-administered medications for this visit.    No Known  Allergies  Family History  Problem Relation Age of Onset  . Heart failure Mother   . Diabetes Mother   . Hypertension Mother   . Congestive Heart Failure Brother   . Hypertension Brother   . Cerebral palsy Son   . Congestive Heart Failure Brother     Social History   Socioeconomic History  . Marital status: Widowed    Spouse name: Not on file  . Number of children: Not on file  . Years of education: Not on file  . Highest education level: Not on file  Occupational History  . Not on file  Tobacco Use  . Smoking status: Never Smoker  . Smokeless tobacco: Never Used  Substance and Sexual Activity  . Alcohol use: No    Alcohol/week: 0.0 standard drinks  . Drug use: No  . Sexual activity: Never  Other Topics Concern  . Not on file  Social History Narrative  . Not on file   Social Determinants of Health   Financial Resource Strain:   . Difficulty of Paying Living Expenses: Not on file  Food Insecurity:   . Worried About Charity fundraiser in the Last Year: Not on file  .  Ran Out of Food in the Last Year: Not on file  Transportation Needs:   . Lack of Transportation (Medical): Not on file  . Lack of Transportation (Non-Medical): Not on file  Physical Activity:   . Days of Exercise per Week: Not on file  . Minutes of Exercise per Session: Not on file  Stress:   . Feeling of Stress : Not on file  Social Connections:   . Frequency of Communication with Friends and Family: Not on file  . Frequency of Social Gatherings with Friends and Family: Not on file  . Attends Religious Services: Not on file  . Active Member of Clubs or Organizations: Not on file  . Attends Archivist Meetings: Not on file  . Marital Status: Not on file  Intimate Partner Violence:   . Fear of Current or Ex-Partner: Not on file  . Emotionally Abused: Not on file  . Physically Abused: Not on file  . Sexually Abused: Not on file     Constitutional: Denies fever, malaise, fatigue,  headache or abrupt weight changes.  Respiratory: Denies difficulty breathing, shortness of breath, cough or sputum production.   Cardiovascular:  Denies chest pain, chest tightness, palpitations or swelling in the hands or feet.  Gastrointestinal: Pt reports dark stool. Denies abdominal pain, bloating, constipation, diarrhea or blood in the stool.  GU: Denies urgency, frequency, pain with urination, burning sensation, blood in urine, odor or discharge. Skin: Pt reports pale skin. Denies redness, rashes, lesions or ulcercations.  Neurological: Pt reports intermittent dizziness. Denies difficulty with memory, difficulty with speech or problems with balance and coordination.    No other specific complaints in a complete review of systems (except as listed in HPI above).  Objective:   Physical Exam   BP 116/68   Pulse 80   Temp (!) 96.9 F (36.1 C) (Temporal)   Wt 145 lb (65.8 kg)   SpO2 97%   BMI 24.89 kg/m   Wt Readings from Last 3 Encounters:  10/14/19 145 lb (65.8 kg)  10/07/19 146 lb 12 oz (66.6 kg)  08/26/19 155 lb 4 oz (70.4 kg)    General: Appears her stated age, well developed, well nourished in NAD. Skin: Warm, dry and intact. No pallor noted. Cardiovascular: Normal rate and rhythm. S1,S2 noted.  No murmur, rubs or gallops noted.  Pulmonary/Chest: Normal effort and positive vesicular breath sounds. No respiratory distress. No wheezes, rales or ronchi noted.  Abdomen: Soft and nontender.  Musculoskeletal:  No difficulty with gait.  Neurological: Alert and oriented.  BMET    Component Value Date/Time   NA 137 10/14/2019 1058   NA 140 08/15/2015 0803   K 5.0 10/14/2019 1058   CL 105 10/14/2019 1058   CO2 25 10/14/2019 1058   GLUCOSE 124 (H) 10/14/2019 1058   BUN 25 (H) 10/14/2019 1058   BUN 11 08/15/2015 0803   CREATININE 1.12 10/14/2019 1058   CALCIUM 9.5 10/14/2019 1058   GFRNONAA >60 08/10/2019 0506   GFRAA >60 08/10/2019 0506    Lipid Panel       Component Value Date/Time   CHOL 118 10/14/2019 1058   CHOL 274 (H) 08/15/2015 0803   TRIG 153.0 (H) 10/14/2019 1058   HDL 42.60 10/14/2019 1058   HDL 38 (L) 08/15/2015 0803   CHOLHDL 3 10/14/2019 1058   VLDL 30.6 10/14/2019 1058   LDLCALC 45 10/14/2019 1058   LDLCALC 184 (H) 08/15/2015 0803    CBC    Component Value Date/Time  WBC 8.6 10/14/2019 1058   RBC 2.98 (L) 10/14/2019 1058   HGB 8.5 Repeated and verified X2. (L) 10/14/2019 1058   HCT 25.9 Repeated and verified X2. (L) 10/14/2019 1058   PLT 561.0 (H) 10/14/2019 1058   MCV 86.8 10/14/2019 1058   MCH 28.4 08/10/2019 0506   MCHC 33.0 10/14/2019 1058   RDW 14.7 10/14/2019 1058    Hgb A1C Lab Results  Component Value Date   HGBA1C 5.4 10/14/2019           Assessment & Plan:   Anemia:  Heme positive stool Urinalysis: 1+ lueks, pos nitrites Will send urine culture STAT CBC with Diff, B12, Folate, IBC Panel and Ferritin She adamantly refuses colonoscopy CT abdomen/pelvis with contrast  ER precautions discussed, will follow up after labs and imaging. Webb Silversmith, NP This visit occurred during the SARS-CoV-2 public health emergency.  Safety protocols were in place, including screening questions prior to the visit, additional usage of staff PPE, and extensive cleaning of exam room while observing appropriate contact time as indicated for disinfecting solutions.

## 2019-10-15 NOTE — Addendum Note (Signed)
Addended by: Jearld Fenton on: 10/15/2019 02:49 PM   Modules accepted: Orders

## 2019-10-15 NOTE — Addendum Note (Signed)
Addended by: Jearld Fenton on: 10/15/2019 02:52 PM   Modules accepted: Orders

## 2019-10-17 LAB — URINE CULTURE
MICRO NUMBER:: 10219921
SPECIMEN QUALITY:: ADEQUATE

## 2019-10-18 MED ORDER — NITROFURANTOIN MONOHYD MACRO 100 MG PO CAPS: 100.0000 mg | ORAL_CAPSULE | Freq: Two times a day (BID) | ORAL | 0 refills | Status: DC

## 2019-10-18 NOTE — Addendum Note (Signed)
Addended by: Jearld Fenton on: 10/18/2019 11:29 AM   Modules accepted: Orders

## 2019-10-19 ENCOUNTER — Other Ambulatory Visit: Payer: Self-pay | Admitting: Internal Medicine

## 2019-10-19 DIAGNOSIS — N6310 Unspecified lump in the right breast, unspecified quadrant: Secondary | ICD-10-CM

## 2019-10-21 ENCOUNTER — Ambulatory Visit (INDEPENDENT_AMBULATORY_CARE_PROVIDER_SITE_OTHER): Payer: Medicare HMO | Admitting: *Deleted

## 2019-10-21 DIAGNOSIS — E538 Deficiency of other specified B group vitamins: Secondary | ICD-10-CM | POA: Diagnosis not present

## 2019-10-21 MED ORDER — CYANOCOBALAMIN 1000 MCG/ML IJ SOLN
1000.0000 ug | Freq: Once | INTRAMUSCULAR | Status: AC
  Administered 2019-10-21: 16:00:00 1000 ug via INTRAMUSCULAR

## 2019-10-21 NOTE — Progress Notes (Signed)
Per orders of Regina Baity, NP, injection of B12 1000 mcg/mL given by Harman Langhans. Patient tolerated injection well.  

## 2019-10-22 DIAGNOSIS — D509 Iron deficiency anemia, unspecified: Secondary | ICD-10-CM | POA: Insufficient documentation

## 2019-10-22 NOTE — Progress Notes (Signed)
[place]  [phone] [phone]  ID: Janice Liu OB: 10/22/1940  MR#: [id number]  [id number]  Patient Care Team: Jearld Fenton, NP as PCP - General (Internal Medicine) Wellington Hampshire, MD as PCP - Cardiology (Cardiology)  CHIEF COMPLAINT: Iron deficiency anemia, right breast mass.  INTERVAL HISTORY: Patient is a 79 year old female who was noted to have declining hemoglobin and iron stores on routine blood work.  She subsequently had a CT scan of the abdomen and pelvis for further evaluation which noted a right breast mass.  Upon further questioning patient states the mass has been there for several months but was nontender.  She has some mild weakness and fatigue, but otherwise feels well.  She has no neurologic complaints.  She denies any recent fevers or illnesses.  She has a good appetite and denies weight loss.  She does not complain of any pain.  She has no chest pain, shortness of breath, cough, or hemoptysis.  She denies any nausea, vomiting, constipation, or diarrhea.  She has no melena or hematochezia.  She has no urinary complaints.  Patient otherwise feels well and offers no further specific complaints today.  REVIEW OF SYSTEMS:   Review of Systems  Constitutional: Positive for malaise/fatigue. Negative for fever and weight loss.  Respiratory: Negative.  Negative for cough and shortness of breath.   Cardiovascular: Negative.  Negative for chest pain and leg swelling.  Gastrointestinal: Negative.  Negative for abdominal pain, blood in stool and melena.  Genitourinary: Negative.  Negative for hematuria.  Musculoskeletal: Negative.  Negative for back pain.  Skin: Negative.  Negative for rash.  Neurological: Positive for weakness. Negative for dizziness, focal weakness and headaches.  Psychiatric/Behavioral: Negative.  The patient is not nervous/anxious.     As per HPI. Otherwise, a complete review of systems is  negative.  PAST MEDICAL HISTORY: Past Medical History:  Diagnosis Date  . Allergy   . CAD (coronary artery disease)   . Chicken pox   . Diabetes mellitus ([place])   . Heart murmur   . HFrEF (heart failure with reduced ejection fraction) ([place])   . Hypertension     PAST SURGICAL HISTORY: Past Surgical History:  Procedure Laterality Date  . CORONARY STENT INTERVENTION N/A 08/09/2019   Procedure: CORONARY STENT INTERVENTION;  Surgeon: Nelva Bush, MD;  Location: [facility];  Service: Cardiovascular;  Laterality: N/A;  . LEFT HEART CATH AND CORONARY ANGIOGRAPHY N/A 08/09/2019   Procedure: LEFT HEART CATH AND CORONARY ANGIOGRAPHY;  Surgeon: Nelva Bush, MD;  Location: [facility];  Service: Cardiovascular;  Laterality: N/A;    FAMILY HISTORY: Family History  Problem Relation Age of Onset  . Heart failure Mother   . Diabetes Mother   . Hypertension Mother   . Congestive Heart Failure Brother   . Hypertension Brother   . Cerebral palsy Son   . Congestive Heart Failure Brother   . Breast cancer Neg Hx     ADVANCED DIRECTIVES (Y/N):  N  HEALTH MAINTENANCE: Social History   Tobacco Use  . Smoking status: Never Smoker  . Smokeless tobacco: Never Used  Substance Use Topics  . Alcohol use: No    Alcohol/week: 0.0 standard drinks  . Drug use: No     Colonoscopy:  PAP:  Bone density:  Lipid panel:  No Known Allergies  Current Outpatient Medications  Medication Sig Dispense Refill  . amLODipine (NORVASC) 5 MG tablet Take 1 tablet (5 mg total) by  mouth daily. 90 tablet 3  . calcium carbonate (OS-CAL - DOSED IN MG OF ELEMENTAL CALCIUM) 1250 (500 Ca) MG tablet Take 1 tablet by mouth daily.    . carvedilol (COREG) 25 MG tablet Take 1 tablet (25 mg total) by mouth 2 (two) times daily. 180 tablet 3  . fenofibrate 54 MG tablet TAKE 1 TABLET BY MOUTH EVERY DAY 90 tablet 0  . Ferrous Sulfate (IRON) 325 (65 Fe) MG TABS Take 1 tablet (325 mg total) by  mouth daily. 30 tablet 2  . losartan (COZAAR) 25 MG tablet Take 1 tablet (25 mg total) by mouth daily. 30 tablet 5  . nitrofurantoin, macrocrystal-monohydrate, (MACROBID) 100 MG capsule Take 1 capsule (100 mg total) by mouth 2 (two) times daily. 10 capsule 0  . Omega-3 Fatty Acids (FISH OIL) 500 MG CAPS Take 500 mg by mouth daily.    . potassium chloride SA (KLOR-CON M20) 20 MEQ tablet Take 1 tablet (20 mEq total) by mouth 2 (two) times daily. 180 tablet 3  . rosuvastatin (CRESTOR) 10 MG tablet TAKE 1 TABLET BY MOUTH EVERY DAY 90 tablet 0  . spironolactone (ALDACTONE) 25 MG tablet TAKE 1 TABLET BY MOUTH EVERY DAY 30 tablet 5  . ticagrelor (BRILINTA) 90 MG TABS tablet Take by mouth 2 (two) times daily.    . ASPIRIN LOW DOSE 81 MG EC tablet TAKE 1 TABLET BY MOUTH EVERY DAY (Patient not taking: Reported on 10/29/2019) 30 tablet 5   No current facility-administered medications for this visit.    OBJECTIVE: Vitals:   10/29/19 1352  BP: 123/73  Pulse: 78  Temp: (!) 96.9 F (36.1 C)  SpO2: 100%     Body mass index is 24.68 kg/m.    ECOG FS:0 - Asymptomatic  General: Well-developed, well-nourished, no acute distress. Eyes: Pink conjunctiva, anicteric sclera. HEENT: Normocephalic, moist mucous membranes. Lungs: No audible wheezing or coughing. Heart: Regular rate and rhythm. Abdomen: Soft, nontender, no obvious distention. Musculoskeletal: No edema, cyanosis, or clubbing. Neuro: Alert, answering all questions appropriately. Cranial nerves grossly intact. Skin: No rashes or petechiae noted. Psych: Normal affect. Lymphatics: No cervical, calvicular, axillary or inguinal LAD.   LAB RESULTS:  Lab Results  Component Value Date   NA 137 10/14/2019   K 5.0 10/14/2019   CL 105 10/14/2019   CO2 25 10/14/2019   GLUCOSE 124 (H) 10/14/2019   BUN 25 (H) 10/14/2019   CREATININE 1.12 10/14/2019   CALCIUM 9.5 10/14/2019   PROT 6.9 10/14/2019   ALBUMIN 4.0 10/14/2019   AST 16 10/14/2019    ALT 12 10/14/2019   ALKPHOS 41 10/14/2019   BILITOT 0.4 10/14/2019   GFRNONAA >60 08/10/2019   GFRAA >60 08/10/2019    Lab Results  Component Value Date   WBC 8.4 10/29/2019   NEUTROABS 6.0 10/15/2019   HGB 9.1 (L) 10/29/2019   HCT 29.2 (L) 10/29/2019   MCV 91.5 10/29/2019   PLT 509 (H) 10/29/2019     STUDIES: CT Abdomen Pelvis W Contrast  Result Date: 10/15/2019 CLINICAL DATA:  Blood in stool. Melena. Anemia. EXAM: CT ABDOMEN AND PELVIS WITH CONTRAST TECHNIQUE: Multidetector CT imaging of the abdomen and pelvis was performed using the standard protocol following bolus administration of intravenous contrast. CONTRAST:  63mL OMNIPAQUE IOHEXOL 300 MG/ML  SOLN COMPARISON:  07/18/2017 FINDINGS: Lower Chest: Lung bases are clear. A 2.3 cm mass is seen in the lower outer quadrant of the right breast, highly suspicious for primary breast carcinoma. Hepatobiliary: No hepatic masses  identified. Gallbladder is unremarkable. No evidence of biliary ductal dilatation. Pancreas:  No mass or inflammatory changes. Spleen: Within normal limits in size and appearance. Adrenals/Urinary Tract: No masses identified. Small subcapsular left renal cyst again noted. No evidence of ureteral calculi or hydronephrosis. Stomach/Bowel: No evidence of obstruction, inflammatory process or abnormal fluid collections. Normal appendix visualized. Diverticulosis is seen mainly involving the descending and sigmoid colon, however there is no evidence of diverticulitis. Vascular/Lymphatic: No pathologically enlarged lymph nodes. No abdominal aortic aneurysm. Aortic atherosclerosis incidentally noted. Reproductive:  No mass or other significant abnormality. Other:  None. Musculoskeletal:  No suspicious bone lesions identified. IMPRESSION: 1. No acute findings within the abdomen or pelvis. 2. Colonic diverticulosis, without radiographic evidence of diverticulitis. 3. 2.3 cm mass in lower outer quadrant of right breast, highly suspicious  for primary breast carcinoma. Recommend further evaluation with dedicated breast imaging. No evidence of metastatic disease within the abdomen or pelvis. Electronically Signed   By: Marlaine Hind M.D.   On: 10/15/2019 19:38   US BREAST LTD UNI LEFT INC AXILLA  Result Date: 10/28/2019 CLINICAL DATA:  Right breast mass noted on recent CT abdomen pelvis. EXAM: DIGITAL DIAGNOSTIC bilateral MAMMOGRAM WITH CAD AND TOMO ULTRASOUND bilateral BREAST COMPARISON:  Previous exam(s). ACR Breast Density Category b: There are scattered areas of fibroglandular density. FINDINGS: Cc and MLO views of bilateral breasts, spot magnification cc and lateral views of bilateral breasts are submitted for review. There is a large mass at the right breast 8 o'clock position correlating to the CT noted mass. There is a mass in the posterior upper-outer quadrant right breast. In the posterior upper-outer quadrant right breast, there is an indeterminate group of microcalcifications measuring 9 x 6 x 5 mm. Images of the left breast demonstrate a group of indeterminate microcalcifications in the posterior upper-outer quadrant left breast measuring 3 x 4 x 5 mm. In the upper-outer quadrant left breast, there is a mass. Mammographic images were processed with CAD. On physical exam, Targeted ultrasound is performed, showing lobulated heterogeneous hypoechoic mass at the right breast 8 o'clock 3 cm from nipple measuring 2.6 x 2.2 x 2.8 cm. This correlates to the palpable mass and CT mass. At the right breast 10 o'clock 6 cm from nipple, there is a group of cysts measuring maximum combined dimension of 1.6 cm correlating to the mass seen on mammogram in the posterior upper outer quadrant. Ultrasound of the right axilla is negative. Ultrasound of the left breast demonstrates a 0.7 x 0.3 x 0.7 cm solid hypoechoic mass at the left breast 3 o'clock 2 cm from nipple correlating to the mammographic mass. At the left breast 3 o'clock 5 cm from nipple, there  is a second hypoechoic mass measuring 0.6 x 0.3 x 0.4 cm. A third hypoechoic mass is identified at the left breast 3 o'clock 2 cm from nipple measuring 0.4 x 0.2 x 0.6 cm. Ultrasound of the left axilla is negative. IMPRESSION: Highly suspicious findings. RECOMMENDATION: Recommend ultrasound-guided core biopsy of right breast 8 o'clock mass and 3 masses in the left breast seen on ultrasound. Also recommend bilateral stereotactic core biopsy of indeterminate microcalcifications in the upper-outer quadrant of both breasts. I have discussed the findings and recommendations with the patient. If applicable, a reminder letter will be sent to the patient regarding the next appointment. BI-RADS CATEGORY  5: Highly suggestive of malignancy. Electronically Signed   By: Abelardo Diesel M.D.   On: 10/28/2019 12:55   US BREAST LTD UNI RIGHT  INC AXILLA  Result Date: 10/28/2019 CLINICAL DATA:  Right breast mass noted on recent CT abdomen pelvis. EXAM: DIGITAL DIAGNOSTIC bilateral MAMMOGRAM WITH CAD AND TOMO ULTRASOUND bilateral BREAST COMPARISON:  Previous exam(s). ACR Breast Density Category b: There are scattered areas of fibroglandular density. FINDINGS: Cc and MLO views of bilateral breasts, spot magnification cc and lateral views of bilateral breasts are submitted for review. There is a large mass at the right breast 8 o'clock position correlating to the CT noted mass. There is a mass in the posterior upper-outer quadrant right breast. In the posterior upper-outer quadrant right breast, there is an indeterminate group of microcalcifications measuring 9 x 6 x 5 mm. Images of the left breast demonstrate a group of indeterminate microcalcifications in the posterior upper-outer quadrant left breast measuring 3 x 4 x 5 mm. In the upper-outer quadrant left breast, there is a mass. Mammographic images were processed with CAD. On physical exam, Targeted ultrasound is performed, showing lobulated heterogeneous hypoechoic mass at the  right breast 8 o'clock 3 cm from nipple measuring 2.6 x 2.2 x 2.8 cm. This correlates to the palpable mass and CT mass. At the right breast 10 o'clock 6 cm from nipple, there is a group of cysts measuring maximum combined dimension of 1.6 cm correlating to the mass seen on mammogram in the posterior upper outer quadrant. Ultrasound of the right axilla is negative. Ultrasound of the left breast demonstrates a 0.7 x 0.3 x 0.7 cm solid hypoechoic mass at the left breast 3 o'clock 2 cm from nipple correlating to the mammographic mass. At the left breast 3 o'clock 5 cm from nipple, there is a second hypoechoic mass measuring 0.6 x 0.3 x 0.4 cm. A third hypoechoic mass is identified at the left breast 3 o'clock 2 cm from nipple measuring 0.4 x 0.2 x 0.6 cm. Ultrasound of the left axilla is negative. IMPRESSION: Highly suspicious findings. RECOMMENDATION: Recommend ultrasound-guided core biopsy of right breast 8 o'clock mass and 3 masses in the left breast seen on ultrasound. Also recommend bilateral stereotactic core biopsy of indeterminate microcalcifications in the upper-outer quadrant of both breasts. I have discussed the findings and recommendations with the patient. If applicable, a reminder letter will be sent to the patient regarding the next appointment. BI-RADS CATEGORY  5: Highly suggestive of malignancy. Electronically Signed   By: Abelardo Diesel M.D.   On: 10/28/2019 12:55   MM DIAG BREAST TOMO BILATERAL  Result Date: 10/28/2019 CLINICAL DATA:  Right breast mass noted on recent CT abdomen pelvis. EXAM: DIGITAL DIAGNOSTIC bilateral MAMMOGRAM WITH CAD AND TOMO ULTRASOUND bilateral BREAST COMPARISON:  Previous exam(s). ACR Breast Density Category b: There are scattered areas of fibroglandular density. FINDINGS: Cc and MLO views of bilateral breasts, spot magnification cc and lateral views of bilateral breasts are submitted for review. There is a large mass at the right breast 8 o'clock position correlating to  the CT noted mass. There is a mass in the posterior upper-outer quadrant right breast. In the posterior upper-outer quadrant right breast, there is an indeterminate group of microcalcifications measuring 9 x 6 x 5 mm. Images of the left breast demonstrate a group of indeterminate microcalcifications in the posterior upper-outer quadrant left breast measuring 3 x 4 x 5 mm. In the upper-outer quadrant left breast, there is a mass. Mammographic images were processed with CAD. On physical exam, Targeted ultrasound is performed, showing lobulated heterogeneous hypoechoic mass at the right breast 8 o'clock 3 cm from nipple measuring 2.6  x 2.2 x 2.8 cm. This correlates to the palpable mass and CT mass. At the right breast 10 o'clock 6 cm from nipple, there is a group of cysts measuring maximum combined dimension of 1.6 cm correlating to the mass seen on mammogram in the posterior upper outer quadrant. Ultrasound of the right axilla is negative. Ultrasound of the left breast demonstrates a 0.7 x 0.3 x 0.7 cm solid hypoechoic mass at the left breast 3 o'clock 2 cm from nipple correlating to the mammographic mass. At the left breast 3 o'clock 5 cm from nipple, there is a second hypoechoic mass measuring 0.6 x 0.3 x 0.4 cm. A third hypoechoic mass is identified at the left breast 3 o'clock 2 cm from nipple measuring 0.4 x 0.2 x 0.6 cm. Ultrasound of the left axilla is negative. IMPRESSION: Highly suspicious findings. RECOMMENDATION: Recommend ultrasound-guided core biopsy of right breast 8 o'clock mass and 3 masses in the left breast seen on ultrasound. Also recommend bilateral stereotactic core biopsy of indeterminate microcalcifications in the upper-outer quadrant of both breasts. I have discussed the findings and recommendations with the patient. If applicable, a reminder letter will be sent to the patient regarding the next appointment. BI-RADS CATEGORY  5: Highly suggestive of malignancy. Electronically Signed   By:  Abelardo Diesel M.D.   On: 10/28/2019 12:55    ASSESSMENT: Iron deficiency anemia, right breast mass.  PLAN:    1. Iron deficiency anemia: Patient noted to have significantly reduced hemoglobin and iron stores recently, but iron stores have trended up and are now essentially within normal limits.  She was also noted to have decreased B12 levels and reports that she has recently had a B-12 injection. She has no evidence of hemolysis.  The remainder of her laboratory work from today is pending at the time of dictation.  Return to clinic in one week for further evaluation and consideration of IV Feraheme. 2. Right breast mass: Found incidentally on CT scan. Mammogram results as above.  Highly suspicious for malignancy. Patient will require a biopsy next week.  Follow up as above.  I spent a total of 60 minutes reviewing chart data, face-to-face evaluation with the patient, counseling and coordination of care as detailed above.   Patient expressed understanding and was in agreement with this plan. She also understands that She can call clinic at any time with any questions, concerns, or complaints.   Cancer Staging No matching staging information was found for the patient.  Lloyd Huger, MD   10/29/2019 3:47 PM

## 2019-10-23 ENCOUNTER — Other Ambulatory Visit: Payer: Self-pay | Admitting: Internal Medicine

## 2019-10-28 ENCOUNTER — Ambulatory Visit
Admission: RE | Admit: 2019-10-28 | Discharge: 2019-10-28 | Disposition: A | Discharge status: Home / Self Care | Payer: Medicare HMO | Source: Ambulatory Visit | Attending: Internal Medicine | Admitting: Internal Medicine

## 2019-10-28 ENCOUNTER — Other Ambulatory Visit: Payer: Self-pay

## 2019-10-28 ENCOUNTER — Ambulatory Visit (INDEPENDENT_AMBULATORY_CARE_PROVIDER_SITE_OTHER): Payer: Medicare HMO

## 2019-10-28 ENCOUNTER — Other Ambulatory Visit: Payer: Self-pay | Admitting: Internal Medicine

## 2019-10-28 DIAGNOSIS — N6011 Diffuse cystic mastopathy of right breast: Secondary | ICD-10-CM | POA: Diagnosis not present

## 2019-10-28 DIAGNOSIS — E538 Deficiency of other specified B group vitamins: Secondary | ICD-10-CM | POA: Diagnosis not present

## 2019-10-28 DIAGNOSIS — N6310 Unspecified lump in the right breast, unspecified quadrant: Secondary | ICD-10-CM

## 2019-10-28 DIAGNOSIS — N6312 Unspecified lump in the right breast, upper inner quadrant: Secondary | ICD-10-CM | POA: Diagnosis not present

## 2019-10-28 DIAGNOSIS — R921 Mammographic calcification found on diagnostic imaging of breast: Secondary | ICD-10-CM

## 2019-10-28 DIAGNOSIS — N6313 Unspecified lump in the right breast, lower outer quadrant: Secondary | ICD-10-CM | POA: Insufficient documentation

## 2019-10-28 DIAGNOSIS — R920 Mammographic microcalcification found on diagnostic imaging of breast: Secondary | ICD-10-CM | POA: Diagnosis not present

## 2019-10-28 DIAGNOSIS — N6325 Unspecified lump in the left breast, overlapping quadrants: Secondary | ICD-10-CM | POA: Insufficient documentation

## 2019-10-28 DIAGNOSIS — N6320 Unspecified lump in the left breast, unspecified quadrant: Secondary | ICD-10-CM

## 2019-10-28 DIAGNOSIS — N6323 Unspecified lump in the left breast, lower outer quadrant: Secondary | ICD-10-CM | POA: Diagnosis not present

## 2019-10-28 DIAGNOSIS — R928 Other abnormal and inconclusive findings on diagnostic imaging of breast: Secondary | ICD-10-CM

## 2019-10-28 DIAGNOSIS — N6321 Unspecified lump in the left breast, upper outer quadrant: Secondary | ICD-10-CM | POA: Diagnosis not present

## 2019-10-28 MED ORDER — CYANOCOBALAMIN 1000 MCG/ML IJ SOLN
1000.0000 ug | Freq: Once | INTRAMUSCULAR | Status: AC
  Administered 2019-10-28: 1000 ug via INTRAMUSCULAR

## 2019-10-28 NOTE — Progress Notes (Signed)
Per orders of Webb Silversmith, NP, injection of B12 given by Randall An. Patient tolerated injection well.

## 2019-10-29 ENCOUNTER — Other Ambulatory Visit: Payer: Self-pay

## 2019-10-29 ENCOUNTER — Encounter: Payer: Self-pay | Admitting: Oncology

## 2019-10-29 ENCOUNTER — Inpatient Hospital Stay: Payer: Medicare HMO | Attending: Oncology | Admitting: Oncology

## 2019-10-29 ENCOUNTER — Inpatient Hospital Stay: Payer: Medicare HMO

## 2019-10-29 VITALS — BP 123/73 | HR 78 | Temp 96.9°F | Wt 143.8 lb

## 2019-10-29 DIAGNOSIS — N6321 Unspecified lump in the left breast, upper outer quadrant: Secondary | ICD-10-CM | POA: Diagnosis not present

## 2019-10-29 DIAGNOSIS — I2510 Atherosclerotic heart disease of native coronary artery without angina pectoris: Secondary | ICD-10-CM | POA: Diagnosis not present

## 2019-10-29 DIAGNOSIS — I509 Heart failure, unspecified: Secondary | ICD-10-CM | POA: Diagnosis not present

## 2019-10-29 DIAGNOSIS — E119 Type 2 diabetes mellitus without complications: Secondary | ICD-10-CM | POA: Diagnosis not present

## 2019-10-29 DIAGNOSIS — Z79899 Other long term (current) drug therapy: Secondary | ICD-10-CM | POA: Insufficient documentation

## 2019-10-29 DIAGNOSIS — Z955 Presence of coronary angioplasty implant and graft: Secondary | ICD-10-CM | POA: Diagnosis not present

## 2019-10-29 DIAGNOSIS — D509 Iron deficiency anemia, unspecified: Secondary | ICD-10-CM | POA: Diagnosis not present

## 2019-10-29 DIAGNOSIS — R5381 Other malaise: Secondary | ICD-10-CM | POA: Insufficient documentation

## 2019-10-29 DIAGNOSIS — R531 Weakness: Secondary | ICD-10-CM | POA: Insufficient documentation

## 2019-10-29 DIAGNOSIS — C50511 Malignant neoplasm of lower-outer quadrant of right female breast: Secondary | ICD-10-CM | POA: Diagnosis not present

## 2019-10-29 DIAGNOSIS — I110 Hypertensive heart disease with heart failure: Secondary | ICD-10-CM | POA: Insufficient documentation

## 2019-10-29 DIAGNOSIS — R5383 Other fatigue: Secondary | ICD-10-CM | POA: Diagnosis not present

## 2019-10-29 DIAGNOSIS — N6311 Unspecified lump in the right breast, upper outer quadrant: Secondary | ICD-10-CM | POA: Diagnosis not present

## 2019-10-29 LAB — CBC
HCT: 29.2 % — ABNORMAL LOW (ref 36.0–46.0)
Hemoglobin: 9.1 g/dL — ABNORMAL LOW (ref 12.0–15.0)
MCH: 28.5 pg (ref 26.0–34.0)
MCHC: 31.2 g/dL (ref 30.0–36.0)
MCV: 91.5 fL (ref 80.0–100.0)
Platelets: 509 10*3/uL — ABNORMAL HIGH (ref 150–400)
RBC: 3.19 MIL/uL — ABNORMAL LOW (ref 3.87–5.11)
RDW: 15.8 % — ABNORMAL HIGH (ref 11.5–15.5)
WBC: 8.4 10*3/uL (ref 4.0–10.5)
nRBC: 0 % (ref 0.0–0.2)

## 2019-10-29 LAB — IRON AND TIBC
Iron: 124 ug/dL (ref 28–170)
Saturation Ratios: 24 % (ref 10.4–31.8)
TIBC: 515 ug/dL — ABNORMAL HIGH (ref 250–450)
UIBC: 391 ug/dL

## 2019-10-29 LAB — RETICULOCYTES
Immature Retic Fract: 17.4 % — ABNORMAL HIGH (ref 2.3–15.9)
RBC.: 3.2 MIL/uL — ABNORMAL LOW (ref 3.87–5.11)
Retic Count, Absolute: 105.6 10*3/uL (ref 19.0–186.0)
Retic Ct Pct: 3.3 % — ABNORMAL HIGH (ref 0.4–3.1)

## 2019-10-29 LAB — VITAMIN B12: Vitamin B-12: 3742 pg/mL — ABNORMAL HIGH (ref 180–914)

## 2019-10-29 LAB — DAT, POLYSPECIFIC AHG (ARMC ONLY): Polyspecific AHG test: NEGATIVE

## 2019-10-29 LAB — LACTATE DEHYDROGENASE: LDH: 110 U/L (ref 98–192)

## 2019-10-29 LAB — FOLATE: Folate: 9.8 ng/mL (ref >5.9)

## 2019-10-29 LAB — FERRITIN: Ferritin: 21 ng/mL (ref 11–307)

## 2019-10-30 LAB — ERYTHROPOIETIN: Erythropoietin: 16.5 m[IU]/mL (ref 2.6–18.5)

## 2019-10-30 LAB — HAPTOGLOBIN: Haptoglobin: 300 mg/dL (ref 42–346)

## 2019-11-01 ENCOUNTER — Encounter: Payer: Self-pay | Admitting: *Deleted

## 2019-11-01 ENCOUNTER — Other Ambulatory Visit: Payer: Self-pay | Admitting: Oncology

## 2019-11-01 NOTE — Progress Notes (Signed)
Patient of Dr. Gary Fleet with birads 5 mammogram.  Called patient to establish navigation services and inform her that the breast center will call to schedule her breast biopsy.  Left her a message to return my call.  I have notified Aldona Bar in the breast center that orders are in and she is ready to be scheduled.

## 2019-11-04 ENCOUNTER — Ambulatory Visit
Admission: RE | Admit: 2019-11-04 | Discharge: 2019-11-04 | Disposition: A | Discharge status: Home / Self Care | Payer: Medicare HMO | Source: Ambulatory Visit | Attending: Internal Medicine | Admitting: Internal Medicine

## 2019-11-04 ENCOUNTER — Other Ambulatory Visit: Payer: Self-pay | Admitting: Internal Medicine

## 2019-11-04 ENCOUNTER — Ambulatory Visit (INDEPENDENT_AMBULATORY_CARE_PROVIDER_SITE_OTHER): Payer: Medicare HMO

## 2019-11-04 ENCOUNTER — Other Ambulatory Visit: Payer: Self-pay

## 2019-11-04 DIAGNOSIS — R928 Other abnormal and inconclusive findings on diagnostic imaging of breast: Secondary | ICD-10-CM

## 2019-11-04 DIAGNOSIS — R921 Mammographic calcification found on diagnostic imaging of breast: Secondary | ICD-10-CM | POA: Insufficient documentation

## 2019-11-04 DIAGNOSIS — N6313 Unspecified lump in the right breast, lower outer quadrant: Secondary | ICD-10-CM | POA: Diagnosis not present

## 2019-11-04 DIAGNOSIS — N6320 Unspecified lump in the left breast, unspecified quadrant: Secondary | ICD-10-CM

## 2019-11-04 DIAGNOSIS — I2510 Atherosclerotic heart disease of native coronary artery without angina pectoris: Secondary | ICD-10-CM

## 2019-11-04 DIAGNOSIS — E538 Deficiency of other specified B group vitamins: Secondary | ICD-10-CM | POA: Diagnosis not present

## 2019-11-04 DIAGNOSIS — N6310 Unspecified lump in the right breast, unspecified quadrant: Secondary | ICD-10-CM | POA: Insufficient documentation

## 2019-11-04 DIAGNOSIS — C50511 Malignant neoplasm of lower-outer quadrant of right female breast: Secondary | ICD-10-CM | POA: Diagnosis not present

## 2019-11-04 HISTORY — PX: BREAST BIOPSY: SHX20

## 2019-11-04 MED ORDER — CYANOCOBALAMIN 1000 MCG/ML IJ SOLN
1000.0000 ug | Freq: Once | INTRAMUSCULAR | Status: AC
  Administered 2019-11-04: 1000 ug via INTRAMUSCULAR

## 2019-11-04 NOTE — Progress Notes (Signed)
Per orders of Webb Silversmith, NP, injection of 3rd of 4 weekly B12 1038mcg/ml given by Pilar Grammes, CMA in Left Deltoid. Patient tolerated injection well.  Pt was advised to schedule appt in 1 week for lab prior to 4th injection on the same day.

## 2019-11-05 ENCOUNTER — Other Ambulatory Visit: Payer: Self-pay | Admitting: Anatomic Pathology & Clinical Pathology

## 2019-11-06 DIAGNOSIS — C50511 Malignant neoplasm of lower-outer quadrant of right female breast: Secondary | ICD-10-CM | POA: Insufficient documentation

## 2019-11-06 DIAGNOSIS — Z853 Personal history of malignant neoplasm of breast: Secondary | ICD-10-CM | POA: Insufficient documentation

## 2019-11-06 NOTE — Progress Notes (Signed)
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  INTERVAL HISTORY: Patient returns to clinic today for further evaluation and initiation of IV iron.  She also had a right breast biopsy on November 04, 2019 that revealed the above-stated malignancy.  She continues to have weakness and fatigue, but otherwise feels well. She has no neurologic complaints.  She denies any recent fevers or illnesses.  She has a good appetite and denies weight loss.  She does not complain of any pain.  She has no chest pain, shortness of breath, cough, or hemoptysis.  She denies any nausea, vomiting, constipation, or diarrhea.  She has no melena or hematochezia.  She has no urinary complaints.  Patient offers no further specific complaints today.  REVIEW OF SYSTEMS:   Review of Systems  Constitutional: Positive for malaise/fatigue. Negative for fever and weight loss.  Respiratory: Negative.  Negative for cough and shortness of breath.   Cardiovascular: Negative.  Negative for chest pain and leg swelling.  Gastrointestinal: Negative.  Negative for abdominal pain, blood in stool and melena.  Genitourinary: Negative.  Negative for hematuria.  Musculoskeletal: Negative.  Negative for back pain.  Skin: Negative.  Negative for rash.  Neurological: Positive for weakness. Negative for dizziness, focal weakness and headaches.  Psychiatric/Behavioral: Negative.  The patient is not nervous/anxious.     As per HPI. Otherwise, a complete review of systems is negative.  PAST MEDICAL HISTORY: Past Medical History:  Diagnosis Date  . Allergy   . CAD  (coronary artery disease)   . Chicken pox   . Diabetes mellitus ([place])   . Heart murmur   . HFrEF (heart failure with reduced ejection fraction) ([place])   . Hypertension     PAST SURGICAL HISTORY: Past Surgical History:  Procedure Laterality Date  . BREAST BIOPSY Right 11/04/2019   Affirm Bx- path pending- Ribbon Clip  . BREAST BIOPSY Left 11/04/2019   Affirm Bx- path pending- Coil Clip  . CORONARY STENT INTERVENTION N/A 08/09/2019   Procedure: CORONARY STENT INTERVENTION;  Surgeon: Nelva Bush, MD;  Location: [facility];  Service: Cardiovascular;  Laterality: N/A;  . LEFT HEART CATH AND CORONARY ANGIOGRAPHY N/A 08/09/2019   Procedure: LEFT HEART CATH AND CORONARY ANGIOGRAPHY;  Surgeon: Nelva Bush, MD;  Location: [facility];  Service: Cardiovascular;  Laterality: N/A;    FAMILY HISTORY: Family History  Problem Relation Age of Onset  . Heart failure Mother   . Diabetes Mother   . Hypertension Mother   . Congestive Heart Failure Brother   . Hypertension Brother   . Cerebral palsy Son   . Congestive Heart Failure Brother   . Breast cancer Neg Hx     ADVANCED DIRECTIVES (Y/N):  N  HEALTH MAINTENANCE: Social History   Tobacco Use  . Smoking status: Never Smoker  . Smokeless tobacco: Never Used  Substance Use Topics  . Alcohol use: No    Alcohol/week: 0.0 standard drinks  . Drug use: No     Colonoscopy:  PAP:  Bone density:  Lipid panel:  No Known Allergies  Current Outpatient Medications  Medication Sig Dispense Refill  . amLODipine (NORVASC)  5 MG tablet Take 1 tablet (5 mg total) by mouth daily. 90 tablet 3  . calcium carbonate (OS-CAL - DOSED IN MG OF ELEMENTAL CALCIUM) 1250 (500 Ca) MG tablet Take 1 tablet by mouth daily.    . carvedilol (COREG) 25 MG tablet Take 1 tablet (25 mg total) by mouth 2 (two) times daily. 180 tablet 3  . fenofibrate 54 MG tablet TAKE 1 TABLET BY MOUTH EVERY DAY 90 tablet 0  . Ferrous Sulfate (IRON) 325  (65 Fe) MG TABS Take 1 tablet (325 mg total) by mouth daily. 30 tablet 2  . losartan (COZAAR) 25 MG tablet Take 1 tablet (25 mg total) by mouth daily. 30 tablet 5  . nitrofurantoin, macrocrystal-monohydrate, (MACROBID) 100 MG capsule Take 1 capsule (100 mg total) by mouth 2 (two) times daily. 10 capsule 0  . Omega-3 Fatty Acids (FISH OIL) 500 MG CAPS Take 500 mg by mouth daily.    . potassium chloride SA (KLOR-CON M20) 20 MEQ tablet Take 1 tablet (20 mEq total) by mouth 2 (two) times daily. 180 tablet 3  . rosuvastatin (CRESTOR) 10 MG tablet TAKE 1 TABLET BY MOUTH EVERY DAY 90 tablet 0  . spironolactone (ALDACTONE) 25 MG tablet TAKE 1 TABLET BY MOUTH EVERY DAY 30 tablet 5  . ticagrelor (BRILINTA) 90 MG TABS tablet Take by mouth 2 (two) times daily.     No current facility-administered medications for this visit.    OBJECTIVE: Vitals:   11/08/19 1048  BP: 119/60  Pulse: 79  Resp: 18  Temp: (!) 96.1 F (35.6 C)  SpO2: 100%     Body mass index is 24.55 kg/m.    ECOG FS:0 - Asymptomatic  General: Well-developed, well-nourished, no acute distress. Eyes: Pink conjunctiva, anicteric sclera. HEENT: Normocephalic, moist mucous membranes. Breast: Exam deferred today. Lungs: No audible wheezing or coughing. Heart: Regular rate and rhythm. Abdomen: Soft, nontender, no obvious distention. Musculoskeletal: No edema, cyanosis, or clubbing. Neuro: Alert, answering all questions appropriately. Cranial nerves grossly intact. Skin: No rashes or petechiae noted. Psych: Normal affect.    LAB RESULTS:  Lab Results  Component Value Date   NA 137 10/14/2019   K 5.0 10/14/2019   CL 105 10/14/2019   CO2 25 10/14/2019   GLUCOSE 124 (H) 10/14/2019   BUN 25 (H) 10/14/2019   CREATININE 1.12 10/14/2019   CALCIUM 9.5 10/14/2019   PROT 6.9 10/14/2019   ALBUMIN 4.0 10/14/2019   AST 16 10/14/2019   ALT 12 10/14/2019   ALKPHOS 41 10/14/2019   BILITOT 0.4 10/14/2019   GFRNONAA >60 08/10/2019    GFRAA >60 08/10/2019    Lab Results  Component Value Date   WBC 8.4 10/29/2019   NEUTROABS 6.0 10/15/2019   HGB 9.1 (L) 10/29/2019   HCT 29.2 (L) 10/29/2019   MCV 91.5 10/29/2019   PLT 509 (H) 10/29/2019   Lab Results  Component Value Date   IRON 124 10/29/2019   TIBC 515 (H) 10/29/2019   IRONPCTSAT 24 10/29/2019   Lab Results  Component Value Date   FERRITIN 21 10/29/2019     STUDIES: CT Abdomen Pelvis W Contrast  Result Date: 10/15/2019 CLINICAL DATA:  Blood in stool. Melena. Anemia. EXAM: CT ABDOMEN AND PELVIS WITH CONTRAST TECHNIQUE: Multidetector CT imaging of the abdomen and pelvis was performed using the standard protocol following bolus administration of intravenous contrast. CONTRAST:  14m OMNIPAQUE IOHEXOL 300 MG/ML  SOLN COMPARISON:  07/18/2017 FINDINGS: Lower Chest: Lung bases are clear. A 2.3 cm  mass is seen in the lower outer quadrant of the right breast, highly suspicious for primary breast carcinoma. Hepatobiliary: No hepatic masses identified. Gallbladder is unremarkable. No evidence of biliary ductal dilatation. Pancreas:  No mass or inflammatory changes. Spleen: Within normal limits in size and appearance. Adrenals/Urinary Tract: No masses identified. Small subcapsular left renal cyst again noted. No evidence of ureteral calculi or hydronephrosis. Stomach/Bowel: No evidence of obstruction, inflammatory process or abnormal fluid collections. Normal appendix visualized. Diverticulosis is seen mainly involving the descending and sigmoid colon, however there is no evidence of diverticulitis. Vascular/Lymphatic: No pathologically enlarged lymph nodes. No abdominal aortic aneurysm. Aortic atherosclerosis incidentally noted. Reproductive:  No mass or other significant abnormality. Other:  None. Musculoskeletal:  No suspicious bone lesions identified. IMPRESSION: 1. No acute findings within the abdomen or pelvis. 2. Colonic diverticulosis, without radiographic evidence of  diverticulitis. 3. 2.3 cm mass in lower outer quadrant of right breast, highly suspicious for primary breast carcinoma. Recommend further evaluation with dedicated breast imaging. No evidence of metastatic disease within the abdomen or pelvis. Electronically Signed   By: Marlaine Hind M.D.   On: 10/15/2019 19:38   US BREAST LTD UNI LEFT INC AXILLA  Result Date: 10/28/2019 CLINICAL DATA:  Right breast mass noted on recent CT abdomen pelvis. EXAM: DIGITAL DIAGNOSTIC bilateral MAMMOGRAM WITH CAD AND TOMO ULTRASOUND bilateral BREAST COMPARISON:  Previous exam(s). ACR Breast Density Category b: There are scattered areas of fibroglandular density. FINDINGS: Cc and MLO views of bilateral breasts, spot magnification cc and lateral views of bilateral breasts are submitted for review. There is a large mass at the right breast 8 o'clock position correlating to the CT noted mass. There is a mass in the posterior upper-outer quadrant right breast. In the posterior upper-outer quadrant right breast, there is an indeterminate group of microcalcifications measuring 9 x 6 x 5 mm. Images of the left breast demonstrate a group of indeterminate microcalcifications in the posterior upper-outer quadrant left breast measuring 3 x 4 x 5 mm. In the upper-outer quadrant left breast, there is a mass. Mammographic images were processed with CAD. On physical exam, Targeted ultrasound is performed, showing lobulated heterogeneous hypoechoic mass at the right breast 8 o'clock 3 cm from nipple measuring 2.6 x 2.2 x 2.8 cm. This correlates to the palpable mass and CT mass. At the right breast 10 o'clock 6 cm from nipple, there is a group of cysts measuring maximum combined dimension of 1.6 cm correlating to the mass seen on mammogram in the posterior upper outer quadrant. Ultrasound of the right axilla is negative. Ultrasound of the left breast demonstrates a 0.7 x 0.3 x 0.7 cm solid hypoechoic mass at the left breast 3 o'clock 2 cm from nipple  correlating to the mammographic mass. At the left breast 3 o'clock 5 cm from nipple, there is a second hypoechoic mass measuring 0.6 x 0.3 x 0.4 cm. A third hypoechoic mass is identified at the left breast 3 o'clock 2 cm from nipple measuring 0.4 x 0.2 x 0.6 cm. Ultrasound of the left axilla is negative. IMPRESSION: Highly suspicious findings. RECOMMENDATION: Recommend ultrasound-guided core biopsy of right breast 8 o'clock mass and 3 masses in the left breast seen on ultrasound. Also recommend bilateral stereotactic core biopsy of indeterminate microcalcifications in the upper-outer quadrant of both breasts. I have discussed the findings and recommendations with the patient. If applicable, a reminder letter will be sent to the patient regarding the next appointment. BI-RADS CATEGORY  5: Highly suggestive of  malignancy. Electronically Signed   By: Abelardo Diesel M.D.   On: 10/28/2019 12:55   US BREAST LTD UNI RIGHT INC AXILLA  Result Date: 10/28/2019 CLINICAL DATA:  Right breast mass noted on recent CT abdomen pelvis. EXAM: DIGITAL DIAGNOSTIC bilateral MAMMOGRAM WITH CAD AND TOMO ULTRASOUND bilateral BREAST COMPARISON:  Previous exam(s). ACR Breast Density Category b: There are scattered areas of fibroglandular density. FINDINGS: Cc and MLO views of bilateral breasts, spot magnification cc and lateral views of bilateral breasts are submitted for review. There is a large mass at the right breast 8 o'clock position correlating to the CT noted mass. There is a mass in the posterior upper-outer quadrant right breast. In the posterior upper-outer quadrant right breast, there is an indeterminate group of microcalcifications measuring 9 x 6 x 5 mm. Images of the left breast demonstrate a group of indeterminate microcalcifications in the posterior upper-outer quadrant left breast measuring 3 x 4 x 5 mm. In the upper-outer quadrant left breast, there is a mass. Mammographic images were processed with CAD. On physical  exam, Targeted ultrasound is performed, showing lobulated heterogeneous hypoechoic mass at the right breast 8 o'clock 3 cm from nipple measuring 2.6 x 2.2 x 2.8 cm. This correlates to the palpable mass and CT mass. At the right breast 10 o'clock 6 cm from nipple, there is a group of cysts measuring maximum combined dimension of 1.6 cm correlating to the mass seen on mammogram in the posterior upper outer quadrant. Ultrasound of the right axilla is negative. Ultrasound of the left breast demonstrates a 0.7 x 0.3 x 0.7 cm solid hypoechoic mass at the left breast 3 o'clock 2 cm from nipple correlating to the mammographic mass. At the left breast 3 o'clock 5 cm from nipple, there is a second hypoechoic mass measuring 0.6 x 0.3 x 0.4 cm. A third hypoechoic mass is identified at the left breast 3 o'clock 2 cm from nipple measuring 0.4 x 0.2 x 0.6 cm. Ultrasound of the left axilla is negative. IMPRESSION: Highly suspicious findings. RECOMMENDATION: Recommend ultrasound-guided core biopsy of right breast 8 o'clock mass and 3 masses in the left breast seen on ultrasound. Also recommend bilateral stereotactic core biopsy of indeterminate microcalcifications in the upper-outer quadrant of both breasts. I have discussed the findings and recommendations with the patient. If applicable, a reminder letter will be sent to the patient regarding the next appointment. BI-RADS CATEGORY  5: Highly suggestive of malignancy. Electronically Signed   By: Abelardo Diesel M.D.   On: 10/28/2019 12:55   MM DIAG BREAST TOMO BILATERAL  Result Date: 10/28/2019 CLINICAL DATA:  Right breast mass noted on recent CT abdomen pelvis. EXAM: DIGITAL DIAGNOSTIC bilateral MAMMOGRAM WITH CAD AND TOMO ULTRASOUND bilateral BREAST COMPARISON:  Previous exam(s). ACR Breast Density Category b: There are scattered areas of fibroglandular density. FINDINGS: Cc and MLO views of bilateral breasts, spot magnification cc and lateral views of bilateral breasts are  submitted for review. There is a large mass at the right breast 8 o'clock position correlating to the CT noted mass. There is a mass in the posterior upper-outer quadrant right breast. In the posterior upper-outer quadrant right breast, there is an indeterminate group of microcalcifications measuring 9 x 6 x 5 mm. Images of the left breast demonstrate a group of indeterminate microcalcifications in the posterior upper-outer quadrant left breast measuring 3 x 4 x 5 mm. In the upper-outer quadrant left breast, there is a mass. Mammographic images were processed with CAD. On physical  exam, Targeted ultrasound is performed, showing lobulated heterogeneous hypoechoic mass at the right breast 8 o'clock 3 cm from nipple measuring 2.6 x 2.2 x 2.8 cm. This correlates to the palpable mass and CT mass. At the right breast 10 o'clock 6 cm from nipple, there is a group of cysts measuring maximum combined dimension of 1.6 cm correlating to the mass seen on mammogram in the posterior upper outer quadrant. Ultrasound of the right axilla is negative. Ultrasound of the left breast demonstrates a 0.7 x 0.3 x 0.7 cm solid hypoechoic mass at the left breast 3 o'clock 2 cm from nipple correlating to the mammographic mass. At the left breast 3 o'clock 5 cm from nipple, there is a second hypoechoic mass measuring 0.6 x 0.3 x 0.4 cm. A third hypoechoic mass is identified at the left breast 3 o'clock 2 cm from nipple measuring 0.4 x 0.2 x 0.6 cm. Ultrasound of the left axilla is negative. IMPRESSION: Highly suspicious findings. RECOMMENDATION: Recommend ultrasound-guided core biopsy of right breast 8 o'clock mass and 3 masses in the left breast seen on ultrasound. Also recommend bilateral stereotactic core biopsy of indeterminate microcalcifications in the upper-outer quadrant of both breasts. I have discussed the findings and recommendations with the patient. If applicable, a reminder letter will be sent to the patient regarding the next  appointment. BI-RADS CATEGORY  5: Highly suggestive of malignancy. Electronically Signed   By: Abelardo Diesel M.D.   On: 10/28/2019 12:55   MM CLIP PLACEMENT LEFT  Result Date: 11/04/2019 CLINICAL DATA:  Evaluate COIL clip placement following stereotactic guided LEFT breast biopsy. EXAM: DIAGNOSTIC LEFT MAMMOGRAM POST STEREOTACTIC BIOPSY COMPARISON:  Previous exam(s). FINDINGS: Mammographic images were obtained following stereotactic guided biopsy of 0.5 cm group of calcifications within the UPPER-OUTER LEFT breast. The COIL biopsy marking clip is in expected position at the site of biopsy. IMPRESSION: Appropriate positioning of the COIL shaped biopsy marking clip at the site of biopsy in the UPPER-OUTER LEFT breast. Final Assessment: Post Procedure Mammograms for Marker Placement Electronically Signed   By: Margarette Canada M.D.   On: 11/04/2019 09:25   MM CLIP PLACEMENT RIGHT  Result Date: 11/04/2019 CLINICAL DATA:  Evaluate biopsy clip markers following stereotactic guided biopsy of UPPER-OUTER RIGHT breast calcifications (RIBBON clip) and ultrasound-guided biopsy of LOWER OUTER RIGHT breast mass (HEART clip). EXAM: DIAGNOSTIC RIGHT MAMMOGRAM POST STEREOTACTIC AND ULTRASOUND-GUIDED BIOPSIES COMPARISON:  Previous exam(s). FINDINGS: Mammographic images were obtained following stereotactic guided biopsy of calcifications within the UPPER-OUTER RIGHT breast and following ultrasound-guided biopsy of LOWER OUTER RIGHT breast mass. The RIBBON biopsy marking clip is in expected position at the site of biopsy within the UPPER-OUTER RIGHT breast at the site of biopsied calcifications. The HEART biopsy marking clip is in expected position at the site of biopsy within the OUTER RIGHT breast at the site of biopsied mass. The 2 clips are separated by a distance of 5.5 cm in the craniocaudal dimension. IMPRESSION: Appropriate positioning of the RIBBON shaped biopsy marking clip at the site of biopsy in the UPPER-OUTER RIGHT  breast. Appropriate positioning of the HEART shaped biopsy marking clip at the site of biopsy within the OUTER RIGHT breast. The 2 clips are separated by a distance of 5.5 cm. Final Assessment: Post Procedure Mammograms for Marker Placement Electronically Signed   By: Margarette Canada M.D.   On: 11/04/2019 09:37   MM LT BREAST BX W LOC DEV 1ST LESION IMAGE BX SPEC STEREO GUIDE  Result Date: 11/04/2019 CLINICAL DATA:  79 year old female for tissue sampling of UPPER-OUTER RIGHT breast calcifications and UPPER-OUTER LEFT breast calcifications. EXAM: RIGHT BREAST STEREOTACTIC CORE NEEDLE BIOPSY LEFT BREAST STEREOTACTIC CORE NEEDLE BIOPSY COMPARISON:  Previous exams. FINDINGS: The patient and I discussed the procedure of stereotactic-guided biopsy including benefits and alternatives. We discussed the high likelihood of a successful procedure. We discussed the risks of the procedure including infection, bleeding, tissue injury, clip migration, and inadequate sampling. Informed written consent was given. The usual time out protocol was performed immediately prior to the procedure. RIGHT BREAST STEREOTACTIC CORE NEEDLE BIOPSY (RIBBON clip): Using sterile technique and 1% Lidocaine and 1% lidocaine with epinephrine as local anesthetic, under stereotactic guidance, a 9 gauge vacuum assisted device was used to perform core needle biopsy of the 0.9 cm group of calcifications within the UPPER-OUTER RIGHT breast using a SUPERIOR approach. Specimen radiograph was performed showing calcifications. Specimens with calcifications are identified for pathology. Lesion quadrant: UPPER-OUTER RIGHT breast At the conclusion of the procedure, a RIBBON tissue marker clip was deployed into the biopsy cavity. Follow-up 2-view mammogram was performed and dictated separately. LEFT BREAST STEREOTACTIC CORE NEEDLE BIOPSY (COIL clip): Using sterile technique and 1% Lidocaine and 1% lidocaine with epinephrine as local anesthetic, under stereotactic  guidance, a 9 gauge vacuum assisted device was used to perform core needle biopsy of the 0.5 cm group of calcifications within the UPPER-OUTER LEFT breast using a SUPERIOR approach. Specimen radiograph was performed showing calcifications. Specimens with calcifications are identified for pathology. Lesion quadrant: UPPER-OUTER LEFT breast At the conclusion of the procedure, a COIL tissue marker clip was deployed into the biopsy cavity. Follow-up 2-view mammogram was performed and dictated separately. A total of 5 cc 1% lidocaine and 20 cc 1% lidocaine with epinephrine were given during the procedures. IMPRESSION: Stereotactic-guided biopsies of calcifications within the UPPER-OUTER RIGHT breast and calcifications within the UPPER-OUTER LEFT breast. No apparent complications. Electronically Signed   By: Margarette Canada M.D.   On: 11/04/2019 08:55   MM RT BREAST BX W LOC DEV 1ST LESION IMAGE BX SPEC STEREO GUIDE  Result Date: 11/04/2019 CLINICAL DATA:  79 year old female for tissue sampling of UPPER-OUTER RIGHT breast calcifications and UPPER-OUTER LEFT breast calcifications. EXAM: RIGHT BREAST STEREOTACTIC CORE NEEDLE BIOPSY LEFT BREAST STEREOTACTIC CORE NEEDLE BIOPSY COMPARISON:  Previous exams. FINDINGS: The patient and I discussed the procedure of stereotactic-guided biopsy including benefits and alternatives. We discussed the high likelihood of a successful procedure. We discussed the risks of the procedure including infection, bleeding, tissue injury, clip migration, and inadequate sampling. Informed written consent was given. The usual time out protocol was performed immediately prior to the procedure. RIGHT BREAST STEREOTACTIC CORE NEEDLE BIOPSY (RIBBON clip): Using sterile technique and 1% Lidocaine and 1% lidocaine with epinephrine as local anesthetic, under stereotactic guidance, a 9 gauge vacuum assisted device was used to perform core needle biopsy of the 0.9 cm group of calcifications within the  UPPER-OUTER RIGHT breast using a SUPERIOR approach. Specimen radiograph was performed showing calcifications. Specimens with calcifications are identified for pathology. Lesion quadrant: UPPER-OUTER RIGHT breast At the conclusion of the procedure, a RIBBON tissue marker clip was deployed into the biopsy cavity. Follow-up 2-view mammogram was performed and dictated separately. LEFT BREAST STEREOTACTIC CORE NEEDLE BIOPSY (COIL clip): Using sterile technique and 1% Lidocaine and 1% lidocaine with epinephrine as local anesthetic, under stereotactic guidance, a 9 gauge vacuum assisted device was used to perform core needle biopsy of the 0.5 cm group of calcifications within the UPPER-OUTER LEFT breast using  a SUPERIOR approach. Specimen radiograph was performed showing calcifications. Specimens with calcifications are identified for pathology. Lesion quadrant: UPPER-OUTER LEFT breast At the conclusion of the procedure, a COIL tissue marker clip was deployed into the biopsy cavity. Follow-up 2-view mammogram was performed and dictated separately. A total of 5 cc 1% lidocaine and 20 cc 1% lidocaine with epinephrine were given during the procedures. IMPRESSION: Stereotactic-guided biopsies of calcifications within the UPPER-OUTER RIGHT breast and calcifications within the UPPER-OUTER LEFT breast. No apparent complications. Electronically Signed   By: Margarette Canada M.D.   On: 11/04/2019 08:55   [place] RT BREAST BX W LOC DEV 1ST LESION IMG BX SPEC US GUIDE  Result Date: 11/04/2019 CLINICAL DATA:  79 year old female for tissue sampling of 2.8 cm RIGHT breast mass. EXAM: ULTRASOUND GUIDED RIGHT BREAST CORE NEEDLE BIOPSY COMPARISON:  Previous exam(s). FINDINGS: I met with the patient and we discussed the procedure of ultrasound-guided biopsy, including benefits and alternatives. We discussed the high likelihood of a successful procedure. We discussed the risks of the procedure, including infection, bleeding, tissue injury, clip  migration, and inadequate sampling. Informed written consent was given. The usual time-out protocol was performed immediately prior to the procedure. Lesion quadrant: LOWER OUTER RIGHT breast: Using sterile technique and 1% Lidocaine as local anesthetic, under direct ultrasound visualization, a 14 gauge spring-loaded device was used to perform biopsy of the 2.8 cm mass at the 8 o'clock position of the RIGHT breast using a LATERAL approach. At the conclusion of the procedure a HEART shaped tissue marker clip was deployed into the biopsy cavity. Follow up 2 view mammogram was performed and dictated separately. IMPRESSION: Ultrasound guided biopsy of 2.8 cm LOWER OUTER RIGHT breast mass. No apparent complications. Electronically Signed   By: Margarette Canada M.D.   On: 11/04/2019 09:37   ECHOCARDIOGRAM LIMITED  Result Date: 11/04/2019    ECHOCARDIOGRAM LIMITED REPORT   Patient Name:   Janice Liu [facility] Date of Exam: 11/04/2019 Medical Rec #:  [id number]      Height:       64.0 in Accession #:    [id number]     Weight:       143.8 lb Date of Birth:  05/09/1941      BSA:          1.700 m Patient Age:    16 years       BP:           128/64 mmHg Patient Gender: F              HR:           80 bpm. Exam Location:  [facility] Procedure: Limited Echo, Limited Color Doppler and Cardiac Doppler Indications:    I25.5 Ischemic cardiomyopathy  History:        Patient has prior history of Echocardiogram examinations, most                 recent 08/09/2019. Cardiomyopathy, Previous Myocardial                 Infarction and CAD, Signs/Symptoms:Murmur; Risk                 Factors:Dyslipidemia.  Sonographer:    Pilar Jarvis RDMS, RVT, RDCS Referring Phys: [id number] Robin Searing VISSER IMPRESSIONS  1. Left ventricular ejection fraction, by estimation, is 50%. The left ventricle has normal function. The left ventricle has hypokinesis of the apical and apical septal, apical anterior region. Left ventricular diastolic parameters are  consistent with  Grade I diastolic dysfunction (impaired relaxation).  2. Right ventricular systolic function is normal. The right ventricular size is normal. FINDINGS  Left Ventricle: Left ventricular ejection fraction, by estimation, is 50 to 55%. The left ventricle has low normal function. The left ventricle has no regional wall motion abnormalities. The left ventricular internal cavity size was normal in size. There is no left ventricular hypertrophy. Right Ventricle: The right ventricular size is normal. No increase in right ventricular wall thickness. Right ventricular systolic function is normal. Left Atrium: Left atrial size was normal in size. Right Atrium: Right atrial size was normal in size. Pericardium: There is no evidence of pericardial effusion. Mitral Valve: The mitral valve is normal in structure. Normal mobility of the mitral valve leaflets. Mild mitral annular calcification. Mild mitral valve regurgitation. No evidence of mitral valve stenosis. Tricuspid Valve: The tricuspid valve is normal in structure. Tricuspid valve regurgitation is mild . No evidence of tricuspid stenosis. Aortic Valve: The aortic valve was not well visualized. Aortic valve regurgitation is not visualized. No aortic stenosis is present. Pulmonic Valve: The pulmonic valve was normal in structure. Pulmonic valve regurgitation is not visualized. No evidence of pulmonic stenosis. Aorta: The aortic root is normal in size and structure. Venous: The inferior vena cava is normal in size with greater than 50% respiratory variability, suggesting right atrial pressure of 3 mmHg. IAS/Shunts: No atrial level shunt detected by color flow Doppler.  LEFT VENTRICLE PLAX 2D LVIDd:         3.70 cm     Diastology LVIDs:         2.30 cm     LV e' lateral:   8.49 cm/s LV PW:         0.90 cm     LV E/e' lateral: 12.8 LV IVS:        0.80 cm     LV e' medial:    7.18 cm/s                            LV E/e' medial:  15.2  LV Volumes (MOD) LV vol d, MOD A2C: 53.7 ml  LV vol d, MOD A4C: 73.2 ml LV vol s, MOD A2C: 25.5 ml LV vol s, MOD A4C: 29.5 ml LV SV MOD A2C:     28.2 ml LV SV MOD A4C:     73.2 ml LV SV MOD BP:      36.2 ml RIGHT VENTRICLE RV S prime:     12.00 cm/s TAPSE (M-mode): 2.4 cm MITRAL VALVE MV Area (PHT): 2.95 cm MV Decel Time: 257 msec MV E velocity: 109.00 cm/s MV A velocity: 117.00 cm/s MV E/A ratio:  0.93 Ida Rogue MD Electronically signed by Ida Rogue MD Signature Date/Time: 11/04/2019/7:15:11 PM    Final     ASSESSMENT: Iron deficiency anemia, 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  PLAN:    1. 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  Patient has a second biopsy scheduled for later this week.  Patient will likely require surgery in the near future, but will await final pathology results for next steps.  Follow-up to be announced. 2.  Iron deficiency anemia:  Patient noted to have significantly reduced hemoglobin and iron stores recently, but iron stores have trended up and are now essentially within normal limits.  The remainder of her laboratory work including B12  levels are either negative or within normal limits.  Proceed with 200 mg IV Venofer today.  Return to clinic in 1 and 2 weeks for additional IV Venofer.  Patient will then follow-up in the next several weeks for continued evaluation and treatment of her breast cancer.    I spent a total of 30 minutes reviewing chart data, face-to-face evaluation with the patient, counseling and coordination of care as detailed above.  Patient expressed understanding and was in agreement with this plan. She also understands that She can call clinic at any time with any questions, concerns, or complaints.   Cancer Staging No matching staging information was found for the patient.  Lloyd Huger, MD   11/08/2019 12:36 PM

## 2019-11-08 ENCOUNTER — Telehealth: Payer: Self-pay | Admitting: Internal Medicine

## 2019-11-08 ENCOUNTER — Ambulatory Visit: Payer: Medicare HMO

## 2019-11-08 ENCOUNTER — Inpatient Hospital Stay: Payer: Medicare HMO

## 2019-11-08 ENCOUNTER — Encounter: Payer: Self-pay | Admitting: Oncology

## 2019-11-08 ENCOUNTER — Encounter (INDEPENDENT_AMBULATORY_CARE_PROVIDER_SITE_OTHER): Payer: Self-pay

## 2019-11-08 ENCOUNTER — Other Ambulatory Visit: Payer: Self-pay

## 2019-11-08 ENCOUNTER — Telehealth: Payer: Self-pay

## 2019-11-08 ENCOUNTER — Inpatient Hospital Stay: Payer: Medicare HMO | Admitting: Oncology

## 2019-11-08 VITALS — BP 119/60 | HR 79 | Temp 96.1°F | Resp 18 | Wt 143.0 lb

## 2019-11-08 DIAGNOSIS — R5383 Other fatigue: Secondary | ICD-10-CM | POA: Diagnosis not present

## 2019-11-08 DIAGNOSIS — N6321 Unspecified lump in the left breast, upper outer quadrant: Secondary | ICD-10-CM | POA: Diagnosis not present

## 2019-11-08 DIAGNOSIS — C50511 Malignant neoplasm of lower-outer quadrant of right female breast: Secondary | ICD-10-CM

## 2019-11-08 DIAGNOSIS — I110 Hypertensive heart disease with heart failure: Secondary | ICD-10-CM | POA: Diagnosis not present

## 2019-11-08 DIAGNOSIS — E119 Type 2 diabetes mellitus without complications: Secondary | ICD-10-CM | POA: Diagnosis not present

## 2019-11-08 DIAGNOSIS — R531 Weakness: Secondary | ICD-10-CM | POA: Diagnosis not present

## 2019-11-08 DIAGNOSIS — R5381 Other malaise: Secondary | ICD-10-CM | POA: Diagnosis not present

## 2019-11-08 DIAGNOSIS — N6311 Unspecified lump in the right breast, upper outer quadrant: Secondary | ICD-10-CM | POA: Diagnosis not present

## 2019-11-08 DIAGNOSIS — I509 Heart failure, unspecified: Secondary | ICD-10-CM | POA: Diagnosis not present

## 2019-11-08 DIAGNOSIS — D509 Iron deficiency anemia, unspecified: Secondary | ICD-10-CM | POA: Diagnosis not present

## 2019-11-08 MED ORDER — SODIUM CHLORIDE 0.9 % IV SOLN: 200.0000 mg | Freq: Once | INTRAVENOUS | Status: DC

## 2019-11-08 MED ORDER — IRON SUCROSE 20 MG/ML IV SOLN
200.0000 mg | Freq: Once | INTRAVENOUS | Status: AC
  Administered 2019-11-08: 200 mg via INTRAVENOUS
  Filled 2019-11-08: qty 10

## 2019-11-08 MED ORDER — SODIUM CHLORIDE 0.9 % IV SOLN
Freq: Once | INTRAVENOUS | Status: AC
  Filled 2019-11-08: qty 250

## 2019-11-08 NOTE — Telephone Encounter (Signed)
-----   Message from Arvil Chaco, PA-C sent at 11/08/2019 12:04 PM EDT ----- Kermit Balo news! Please let Janice Liu know that her echo showed improved pump function from 40-45% to 50-55%, which is on the low end of range for normal pump function. The walls of her heart are moving normally and she has normal heart pressures. She has a mildly leaky mitral and tricuspid valve, which we will continue to monitor. When compared with her 07/2019 echo, her most recent echo shows overall improvement in pump function and wall movement from the echo after her catheterization, which is very reassuring.

## 2019-11-08 NOTE — Telephone Encounter (Signed)
Looks like Oncology checked b12 2 weeks later and it was really high levels... please advise

## 2019-11-08 NOTE — Telephone Encounter (Signed)
Call to patient to review Echo.    Son, Janice Liu verbalized understanding and has no further questions at this time.    Advised pt to call for any further questions or concerns.  No further orders.

## 2019-11-08 NOTE — Telephone Encounter (Signed)
Shanon Brow (son) called to schedule next b12 inj. He wanted to know how often she needs them and for how long

## 2019-11-09 ENCOUNTER — Encounter: Payer: Self-pay | Admitting: *Deleted

## 2019-11-09 NOTE — Telephone Encounter (Signed)
Can hold for now. Recheck here or by oncology in 1 month.

## 2019-11-09 NOTE — Progress Notes (Signed)
The patient's son Shanon Brow called and left me a message to call him back in regards to scheduling his mom's surgical consult.  I have left him a message to return my call.  I also called the patient's cell number but no answer.  Will try Shanon Brow again later.

## 2019-11-09 NOTE — Progress Notes (Signed)
Talked to patient's son Shanon Brow.  He would like for his mom to keep any appointments in [place].  No surgical preference.  I have scheduled to see Dr. Luther Bradley on 11/16/19.  She has follow up with Dr. Grayland Ormond on 11/15/19 for an iron infusion.  Will give patient breast cancer educational literature, "My Breast Cancer Treatment Handbook" by Josephine Igo, RN at that appointment.  They are to call with any questions or needs.

## 2019-11-11 ENCOUNTER — Ambulatory Visit: Payer: Self-pay | Admitting: Surgery

## 2019-11-11 LAB — SURGICAL PATHOLOGY

## 2019-11-11 NOTE — Telephone Encounter (Signed)
[name] is aware

## 2019-11-12 ENCOUNTER — Ambulatory Visit
Admission: RE | Admit: 2019-11-12 | Discharge: 2019-11-12 | Disposition: A | Discharge status: Home / Self Care | Payer: Medicare HMO | Source: Ambulatory Visit | Attending: Internal Medicine | Admitting: Internal Medicine

## 2019-11-12 ENCOUNTER — Other Ambulatory Visit: Payer: Self-pay | Admitting: Internal Medicine

## 2019-11-12 DIAGNOSIS — R928 Other abnormal and inconclusive findings on diagnostic imaging of breast: Secondary | ICD-10-CM

## 2019-11-12 DIAGNOSIS — N6320 Unspecified lump in the left breast, unspecified quadrant: Secondary | ICD-10-CM | POA: Insufficient documentation

## 2019-11-12 DIAGNOSIS — N6325 Unspecified lump in the left breast, overlapping quadrants: Secondary | ICD-10-CM | POA: Diagnosis not present

## 2019-11-12 DIAGNOSIS — N6321 Unspecified lump in the left breast, upper outer quadrant: Secondary | ICD-10-CM | POA: Diagnosis not present

## 2019-11-12 DIAGNOSIS — N6012 Diffuse cystic mastopathy of left breast: Secondary | ICD-10-CM | POA: Diagnosis not present

## 2019-11-12 DIAGNOSIS — Z7689 Persons encountering health services in other specified circumstances: Secondary | ICD-10-CM | POA: Diagnosis not present

## 2019-11-12 HISTORY — PX: BREAST BIOPSY: SHX20

## 2019-11-12 HISTORY — PX: BREAST CYST ASPIRATION: SHX578

## 2019-11-15 ENCOUNTER — Other Ambulatory Visit: Payer: Self-pay

## 2019-11-15 ENCOUNTER — Inpatient Hospital Stay: Payer: Medicare HMO | Attending: Oncology

## 2019-11-15 VITALS — BP 118/74 | HR 73 | Temp 97.4°F | Resp 18

## 2019-11-15 DIAGNOSIS — D509 Iron deficiency anemia, unspecified: Secondary | ICD-10-CM | POA: Diagnosis not present

## 2019-11-15 LAB — SURGICAL PATHOLOGY

## 2019-11-15 MED ORDER — SODIUM CHLORIDE 0.9 % IV SOLN
Freq: Once | INTRAVENOUS | Status: AC
  Filled 2019-11-15: qty 250

## 2019-11-15 MED ORDER — IRON SUCROSE 20 MG/ML IV SOLN
200.0000 mg | Freq: Once | INTRAVENOUS | Status: AC
  Administered 2019-11-15: 200 mg via INTRAVENOUS
  Filled 2019-11-15: qty 10

## 2019-11-15 MED ORDER — SODIUM CHLORIDE 0.9 % IV SOLN: 200.0000 mg | Freq: Once | INTRAVENOUS | Status: DC

## 2019-11-16 ENCOUNTER — Encounter: Payer: Self-pay | Admitting: Surgery

## 2019-11-16 ENCOUNTER — Ambulatory Visit: Payer: Medicare HMO | Admitting: Surgery

## 2019-11-16 VITALS — BP 100/66 | HR 82 | Temp 97.7°F | Resp 14 | Ht 63.5 in | Wt 141.6 lb

## 2019-11-16 DIAGNOSIS — Z170 Estrogen receptor positive status [ER+]: Secondary | ICD-10-CM

## 2019-11-16 DIAGNOSIS — C50511 Malignant neoplasm of lower-outer quadrant of right female breast: Secondary | ICD-10-CM | POA: Diagnosis not present

## 2019-11-16 NOTE — Progress Notes (Signed)
Patient ID: Janice Liu, female   DOB: Nov 26, 1940, 79 y.o.   MRN: [id number]  Chief Complaint: Right breast mass, right breast cancer.  History of Present Illness Janice Liu is a 79 y.o. female with incidental CT identification of a right breast mass.  She has since gone imaging and work-up of both breasts including biopsies of other benign lesions.  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  Ultrasound did not show any axillary adenopathy on exam. Of note she had a myocardial infarction at the end of December where she had stents placed and is currently on Brilinta.  She reports shortness of breath with climbing 1 flight of stairs.  Past Medical History Past Medical History:  Diagnosis Date  . Allergy   . CAD (coronary artery disease)   . Chicken pox   . Diabetes mellitus ([place])   . Heart murmur   . HFrEF (heart failure with reduced ejection fraction) ([place])   . Hypertension       Past Surgical History:  Procedure Laterality Date  . BREAST BIOPSY Right 11/04/2019   Affirm Bx- path pending- Ribbon Clip  . BREAST BIOPSY Left 11/04/2019   Affirm Bx- path pending- Coil Clip  . BREAST BIOPSY Left 11/12/2019   [place] bx/ ribbon clip/ path pending  . BREAST CYST ASPIRATION Left 11/12/2019   2 areas  . CORONARY STENT INTERVENTION N/A 08/09/2019   Procedure: CORONARY STENT INTERVENTION;  Surgeon: Nelva Bush, MD;  Location: [facility];  Service: Cardiovascular;  Laterality: N/A;  . LEFT HEART CATH AND CORONARY ANGIOGRAPHY N/A 08/09/2019   Procedure: LEFT HEART CATH AND CORONARY ANGIOGRAPHY;  Surgeon: Nelva Bush, MD;  Location: [facility];  Service: Cardiovascular;  Laterality: N/A;    No Known Allergies  Current Outpatient Medications  Medication Sig Dispense Refill  . amLODipine (NORVASC) 5 MG tablet Take 1 tablet (5 mg total) by mouth daily. 90  tablet 3  . calcium carbonate (OS-CAL - DOSED IN MG OF ELEMENTAL CALCIUM) 1250 (500 Ca) MG tablet Take 1 tablet by mouth daily.    . carvedilol (COREG) 25 MG tablet Take 1 tablet (25 mg total) by mouth 2 (two) times daily. 180 tablet 3  . fenofibrate 54 MG tablet TAKE 1 TABLET BY MOUTH EVERY DAY 90 tablet 0  . Ferrous Sulfate (IRON) 325 (65 Fe) MG TABS Take 1 tablet (325 mg total) by mouth daily. 30 tablet 2  . losartan (COZAAR) 25 MG tablet Take 1 tablet (25 mg total) by mouth daily. 30 tablet 5  . nitrofurantoin, macrocrystal-monohydrate, (MACROBID) 100 MG capsule Take 1 capsule (100 mg total) by mouth 2 (two) times daily. 10 capsule 0  . Omega-3 Fatty Acids (FISH OIL) 500 MG CAPS Take 500 mg by mouth daily.    . potassium chloride SA (KLOR-CON M20) 20 MEQ tablet Take 1 tablet (20 mEq total) by mouth 2 (two) times daily. 180 tablet 3  . rosuvastatin (CRESTOR) 10 MG tablet TAKE 1 TABLET BY MOUTH EVERY DAY 90 tablet 0  . spironolactone (ALDACTONE) 25 MG tablet TAKE 1 TABLET BY MOUTH EVERY DAY 30 tablet 5  . ticagrelor (BRILINTA) 90 MG TABS tablet Take by mouth 2 (two) times daily.     No current facility-administered medications for this visit.    Family History Family History  Problem Relation Age of Onset  . Heart failure Mother   .  Diabetes Mother   . Hypertension Mother   . Congestive Heart Failure Brother   . Hypertension Brother   . Cerebral palsy Son   . Congestive Heart Failure Brother   . Breast cancer Sister 42  . Thyroid cancer Sister       Social History Social History   Tobacco Use  . Smoking status: Never Smoker  . Smokeless tobacco: Never Used  Substance Use Topics  . Alcohol use: No    Alcohol/week: 0.0 standard drinks  . Drug use: No        Review of Systems  All other systems reviewed and are negative.    Physical Exam Blood pressure 100/66, pulse 82, temperature 97.7 F (36.5 C), resp. rate 14, height 5' 3.5" (1.613 m), weight 141 lb 9.6 oz (64.2  kg), SpO2 99 %. Last Weight  Most recent update: 11/16/2019  1:47 PM   Weight  64.2 kg (141 lb 9.6 oz)            CONSTITUTIONAL: Well developed, and nourished, appropriately responsive and aware without distress.   EYES: Sclera non-icteric.   EARS, NOSE, MOUTH AND THROAT: Mask worn.    Hearing is intact to voice.  NECK: Trachea is midline, and there is no jugular venous distension.  LYMPH NODES:  Lymph nodes in the neck are not enlarged. RESPIRATORY:  Lungs are clear, and breath sounds are equal bilaterally. Normal respiratory effort without pathologic use of accessory muscles. CARDIOVASCULAR: Heart is regular in rate and rhythm. GI: The abdomen is soft, nontender, and nondistended. There were no palpable masses. I did not appreciate hepatosplenomegaly. There were normal bowel sounds. GU: Left breast is unremarkable.  Right breast has a 2-1/2 cm mass in the outer lateral aspect, it is mobile but appears to dimple the adjacent overlying skin. MUSCULOSKELETAL:  Symmetrical muscle tone appreciated in all four extremities.    SKIN: Skin turgor is normal. No pathologic skin lesions appreciated.  NEUROLOGIC:  Motor and sensation appear grossly normal.  Cranial nerves are grossly without defect. PSYCH:  Alert and oriented to person, place and time. Affect is appropriate for situation.  Data Reviewed I have personally reviewed what is currently available of the patient's imaging, recent labs and medical records.   Labs:  CBC Latest Ref Rng & Units 10/29/2019 10/15/2019 10/14/2019  WBC 4.0 - 10.5 K/uL 8.4 9.9 8.6  Hemoglobin 12.0 - 15.0 g/dL 9.1(L) 8.3 Repeated and verified X2.(L) 8.5 Repeated and verified X2.(L)  Hematocrit 36.0 - 46.0 % 29.2(L) 24.6 Repeated and verified X2.(L) 25.9 Repeated and verified X2.(L)  Platelets 150 - 400 K/uL 509(H) 546.0(H) 561.0(H)   CMP Latest Ref Rng & Units 10/14/2019 08/10/2019 08/10/2019  Glucose 70 - 99 mg/dL 124(H) - 114(H)  BUN 6 - 23 mg/dL 25(H) - 9   Creatinine 0.40 - 1.20 mg/dL 1.12 - 0.75  Sodium 135 - 145 mEq/L 137 - 140  Potassium 3.5 - 5.1 mEq/L 5.0 3.9 3.0(L)  Chloride 96 - 112 mEq/L 105 - 107  CO2 19 - 32 mEq/L 25 - 23  Calcium 8.4 - 10.5 mg/dL 9.5 - 8.6(L)  Total Protein 6.0 - 8.3 g/dL 6.9 - -  Total Bilirubin 0.2 - 1.2 mg/dL 0.4 - -  Alkaline Phos 39 - 117 U/L 41 - -  AST 0 - 37 U/L 16 - -  ALT 0 - 35 U/L 12 - -      Imaging:  Within last 24 hrs: No results found.  Assessment  Right breast cancer, greater than 2 cm in size, ER/PR positive and HER-2 negative. Patient Active Problem List   Diagnosis Date Noted  . Carcinoma of lower-outer quadrant of female breast, right ([place]) 11/06/2019  . Iron deficiency anemia 10/22/2019  . Type 2 diabetes mellitus without complication, without long-term current use of insulin ([place]) 10/14/2019  . CHF (congestive heart failure), NYHA class II, chronic, systolic ([place]) 01/00/7121  . Ischemic cardiomyopathy   . NSTEMI (non-ST elevated myocardial infarction) ([place]) 08/09/2019  . HLD (hyperlipidemia) 02/28/2017  . Environmental allergies 08/29/2015  . Essential hypertension 07/15/2014    Plan    I believe a lumpectomy would require less surgical stressors, considering the patient's age and comorbidities this may be reasonable though not the ideal for long-term prevention of recurrence.  Radiation oncology may be utilized but may also be in excess.  I doubt there will be a role for chemotherapy, and the results of a sentinel lymph node may not change a course of therapy.  We will have further discussion prior to determining the surgical approach.  I did discuss the option of a mastectomy with the patient.  But we both concur that less is probably better in her scenario.  Face-to-face time spent with the patient and accompanying care providers(if present) was 30 minutes, with more than 50% of the time spent counseling, educating, and coordinating care of the patient.      Ronny Bacon M.D., FACS 11/16/2019, 4:49 PM

## 2019-11-16 NOTE — Patient Instructions (Addendum)
May take Miralax in a full glass of liquid daily to help with constipation.   We will speak with Cardiology to see if they are comfortable with [place] doing surgery due to you being on a blood thinner(Brilinta).  We will also speak with Dr Grayland Ormond about your case.  We will have you see [place] back here in 2 weeks to discuss your care.

## 2019-11-22 ENCOUNTER — Other Ambulatory Visit: Payer: Self-pay

## 2019-11-22 ENCOUNTER — Inpatient Hospital Stay: Payer: Medicare HMO

## 2019-11-22 VITALS — BP 116/52 | HR 66 | Temp 96.0°F | Resp 18

## 2019-11-22 DIAGNOSIS — D509 Iron deficiency anemia, unspecified: Secondary | ICD-10-CM | POA: Diagnosis not present

## 2019-11-22 MED ORDER — IRON SUCROSE 20 MG/ML IV SOLN
200.0000 mg | Freq: Once | INTRAVENOUS | Status: AC
  Administered 2019-11-22: 200 mg via INTRAVENOUS
  Filled 2019-11-22: qty 10

## 2019-11-22 MED ORDER — SODIUM CHLORIDE 0.9 % IV SOLN: 200.0000 mg | Freq: Once | INTRAVENOUS | Status: DC

## 2019-11-22 MED ORDER — SODIUM CHLORIDE 0.9 % IV SOLN
Freq: Once | INTRAVENOUS | Status: AC
  Filled 2019-11-22: qty 250

## 2019-11-22 NOTE — Progress Notes (Signed)
Pt tolerated infusion well. Pt and VS stable at discharge.  

## 2019-11-25 ENCOUNTER — Other Ambulatory Visit: Payer: Self-pay | Admitting: Internal Medicine

## 2019-11-25 DIAGNOSIS — E7800 Pure hypercholesterolemia, unspecified: Secondary | ICD-10-CM

## 2019-11-29 ENCOUNTER — Other Ambulatory Visit: Payer: Self-pay | Admitting: Cardiovascular Disease

## 2019-11-30 ENCOUNTER — Ambulatory Visit: Payer: Self-pay | Admitting: Surgery

## 2019-11-30 ENCOUNTER — Ambulatory Visit: Payer: Medicare HMO | Admitting: Surgery

## 2019-11-30 ENCOUNTER — Other Ambulatory Visit: Payer: Self-pay

## 2019-11-30 ENCOUNTER — Encounter: Payer: Self-pay | Admitting: Surgery

## 2019-11-30 ENCOUNTER — Telehealth: Payer: Self-pay | Admitting: Surgery

## 2019-11-30 VITALS — BP 112/70 | HR 85 | Temp 97.6°F | Resp 12 | Ht 64.0 in | Wt 140.8 lb

## 2019-11-30 DIAGNOSIS — C50511 Malignant neoplasm of lower-outer quadrant of right female breast: Secondary | ICD-10-CM | POA: Diagnosis not present

## 2019-11-30 DIAGNOSIS — Z170 Estrogen receptor positive status [ER+]: Secondary | ICD-10-CM

## 2019-11-30 NOTE — Telephone Encounter (Signed)
Spoke with Shanon Brow (son), he has been advised of Pre-Admission date/time, COVID Testing date and Surgery date.  Surgery Date: 12/10/19 Preadmission Testing Date: 12/06/19 (phone 8a-1p) Covid Testing Date: 12/08/19 - patient advised to go to the [facility] ([place]) between 8a-1p   He is also made aware to call [phone], between 1-3:00pm the day before surgery, to find out what time to arrive for surgery.

## 2019-11-30 NOTE — H&P (View-Only) (Signed)
Patient ID: Janice Liu, female   DOB: 07/04/1941, 79 y.o.   MRN: [id number]  Chief Complaint: Right breast mass, right breast cancer.  History of Present Illness Janice Liu is a 79 y.o. female with incidental CT identification of a right breast mass.  She has since gone imaging and work-up of both breasts including biopsies of other benign lesions.  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  Ultrasound did not show any axillary adenopathy on exam. Of note she had a myocardial infarction at the end of December where she had stents placed and is currently on Brilinta.  She reports shortness of breath with climbing 1 flight of stairs.  Past Medical History Past Medical History:  Diagnosis Date  . Allergy   . CAD (coronary artery disease)   . Chicken pox   . Diabetes mellitus (HCC)   . Heart murmur   . HFrEF (heart failure with reduced ejection fraction) (HCC)   . Hypertension       Past Surgical History:  Procedure Laterality Date  . BREAST BIOPSY Right 11/04/2019   Affirm Bx- path pending- Ribbon Clip  . BREAST BIOPSY Left 11/04/2019   Affirm Bx- path pending- Coil Clip  . BREAST BIOPSY Left 11/12/2019   us bx/ ribbon clip/ path pending  . BREAST CYST ASPIRATION Left 11/12/2019   2 areas  . CORONARY STENT INTERVENTION N/A 08/09/2019   Procedure: CORONARY STENT INTERVENTION;  Surgeon: End, Christopher, MD;  Location: [facility];  Service: Cardiovascular;  Laterality: N/A;  . LEFT HEART CATH AND CORONARY ANGIOGRAPHY N/A 08/09/2019   Procedure: LEFT HEART CATH AND CORONARY ANGIOGRAPHY;  Surgeon: End, Christopher, MD;  Location: [facility];  Service: Cardiovascular;  Laterality: N/A;    No Known Allergies  Current Outpatient Medications  Medication Sig Dispense Refill  . amLODipine (NORVASC) 5 MG tablet Take 1 tablet (5 mg total) by mouth daily. 90  tablet 3  . BRILINTA 90 MG TABS tablet TAKE 1 TABLET (90 MG TOTAL) BY MOUTH 2 (TWO) TIMES DAILY. 180 tablet 0  . calcium carbonate (OS-CAL - DOSED IN MG OF ELEMENTAL CALCIUM) 1250 (500 Ca) MG tablet Take 1 tablet by mouth daily.    . carvedilol (COREG) 25 MG tablet Take 1 tablet (25 mg total) by mouth 2 (two) times daily. 180 tablet 3  . fenofibrate 54 MG tablet TAKE 1 TABLET BY MOUTH EVERY DAY 90 tablet 0  . Ferrous Sulfate (IRON) 325 (65 Fe) MG TABS Take 1 tablet (325 mg total) by mouth daily. 30 tablet 2  . losartan (COZAAR) 25 MG tablet Take 1 tablet (25 mg total) by mouth daily. 30 tablet 5  . nitrofurantoin, macrocrystal-monohydrate, (MACROBID) 100 MG capsule Take 1 capsule (100 mg total) by mouth 2 (two) times daily. 10 capsule 0  . Omega-3 Fatty Acids (FISH OIL) 500 MG CAPS Take 500 mg by mouth daily.    . potassium chloride SA (KLOR-CON M20) 20 MEQ tablet Take 1 tablet (20 mEq total) by mouth 2 (two) times daily. 180 tablet 3  . rosuvastatin (CRESTOR) 10 MG tablet TAKE 1 TABLET BY MOUTH EVERY DAY 90 tablet 0  . spironolactone (ALDACTONE) 25 MG tablet TAKE 1 TABLET BY MOUTH EVERY DAY 30 tablet 5   No current facility-administered medications for this visit.    Family History Family History  Problem Relation Age of Onset  .   Heart failure Mother   . Diabetes Mother   . Hypertension Mother   . Congestive Heart Failure Brother   . Hypertension Brother   . Cerebral palsy Son   . Congestive Heart Failure Brother   . Breast cancer Sister 75  . Thyroid cancer Sister       Social History Social History   Tobacco Use  . Smoking status: Never Smoker  . Smokeless tobacco: Never Used  Substance Use Topics  . Alcohol use: No    Alcohol/week: 0.0 standard drinks  . Drug use: No        Review of Systems  All other systems reviewed and are negative.    Physical Exam Blood pressure 112/70, pulse 85, temperature 97.6 F (36.4 C), resp. rate 12, height 5' 4" (1.626 m), weight  140 lb 12.8 oz (63.9 kg), SpO2 98 %. Last Weight  Most recent update: 11/30/2019  9:10 AM   Weight  63.9 kg (140 lb 12.8 oz)            CONSTITUTIONAL: Well developed, and nourished, appropriately responsive and aware without distress.   EYES: Sclera non-icteric.   EARS, NOSE, MOUTH AND THROAT: Mask worn.    Hearing is intact to voice.  NECK: Trachea is midline, and there is no jugular venous distension.  LYMPH NODES:  Lymph nodes in the neck are not enlarged. RESPIRATORY:  Lungs are clear, and breath sounds are equal bilaterally. Normal respiratory effort without pathologic use of accessory muscles. CARDIOVASCULAR: Heart is regular in rate and rhythm. GI: The abdomen is soft, nontender, and nondistended. There were no palpable masses. I did not appreciate hepatosplenomegaly. There were normal bowel sounds. GU: Left breast is unremarkable.  Right breast has a 2-1/2 cm mass in the outer lateral aspect, it is mobile but appears to dimple the adjacent overlying skin. MUSCULOSKELETAL:  Symmetrical muscle tone appreciated in all four extremities.    SKIN: Skin turgor is normal. No pathologic skin lesions appreciated.  NEUROLOGIC:  Motor and sensation appear grossly normal.  Cranial nerves are grossly without defect. PSYCH:  Alert and oriented to person, place and time. Affect is appropriate for situation.  Data Reviewed I have personally reviewed what is currently available of the patient's imaging, recent labs and medical records.   Labs:  CBC Latest Ref Rng & Units 10/29/2019 10/15/2019 10/14/2019  WBC 4.0 - 10.5 K/uL 8.4 9.9 8.6  Hemoglobin 12.0 - 15.0 g/dL 9.1(L) 8.3 Repeated and verified X2.(L) 8.5 Repeated and verified X2.(L)  Hematocrit 36.0 - 46.0 % 29.2(L) 24.6 Repeated and verified X2.(L) 25.9 Repeated and verified X2.(L)  Platelets 150 - 400 K/uL 509(H) 546.0(H) 561.0(H)   CMP Latest Ref Rng & Units 10/14/2019 08/10/2019 08/10/2019  Glucose 70 - 99 mg/dL 124(H) - 114(H)  BUN 6 - 23  mg/dL 25(H) - 9  Creatinine 0.40 - 1.20 mg/dL 1.12 - 0.75  Sodium 135 - 145 mEq/L 137 - 140  Potassium 3.5 - 5.1 mEq/L 5.0 3.9 3.0(L)  Chloride 96 - 112 mEq/L 105 - 107  CO2 19 - 32 mEq/L 25 - 23  Calcium 8.4 - 10.5 mg/dL 9.5 - 8.6(L)  Total Protein 6.0 - 8.3 g/dL 6.9 - -  Total Bilirubin 0.2 - 1.2 mg/dL 0.4 - -  Alkaline Phos 39 - 117 U/L 41 - -  AST 0 - 37 U/L 16 - -  ALT 0 - 35 U/L 12 - -      Imaging:  Within last 24 hrs: No   results found.  Assessment    Right breast cancer, greater than 2 cm in size, ER/PR positive and HER-2 negative. Patient Active Problem List   Diagnosis Date Noted  . Carcinoma of lower-outer quadrant of female breast, right ([place]) 11/06/2019  . Iron deficiency anemia 10/22/2019  . Type 2 diabetes mellitus without complication, without long-term current use of insulin ([place]) 10/14/2019  . CHF (congestive heart failure), NYHA class II, chronic, systolic ([place]) 02/54/2706  . Ischemic cardiomyopathy   . NSTEMI (non-ST elevated myocardial infarction) ([place]) 08/09/2019  . HLD (hyperlipidemia) 02/28/2017  . Environmental allergies 08/29/2015  . Essential hypertension 07/15/2014    Plan    We will proceed with right breast lumpectomy, foregoing any sentinel lymph node biopsy at this time.  This may give [place] more flexibility with anesthesia.  We will hold her Brilinta for 2 days preop. I believe a lumpectomy would require less surgical stressors, considering the patient's age and comorbidities this may be reasonable though not the ideal for long-term prevention of recurrence.  Radiation oncology may be utilized but may also be in excess.  I doubt there will be a role for chemotherapy, and the results of a sentinel lymph node may not change a course of therapy.  We will have further discussion prior to determining the surgical approach.  I did discuss the option of a mastectomy with the patient.  But we both concur that less is probably better in her scenario.   I  discussed risks of bleeding, infection, damage to surrounding tissues, having positive margins, needing additional resection, trauma to nerves causing arm numbness or difficulty raising arm, causing lymphoedema in the arm; as well as anesthesia risks of MI, stroke, prolonged ventilation, pulmonary embolism, thrombosis and even death.   Patient was given the opportunity to ask questions and have them answered.  They would like to proceed with right  breast lumpectomy.  Face-to-face time spent with the patient and accompanying care providers(if present) was 20 minutes, with more than 50% of the time spent counseling, educating, and coordinating care of the patient.      Ronny Bacon M.D., FACS 11/30/2019, 9:41 AM

## 2019-11-30 NOTE — Progress Notes (Signed)
Patient ID: Janice Liu, female   DOB: 1940/08/28, 79 y.o.   MRN: [id number]  Chief Complaint: Right breast mass, right breast cancer.  History of Present Illness Janice Liu is a 79 y.o. female with incidental CT identification of a right breast mass.  She has since gone imaging and work-up of both breasts including biopsies of other benign lesions.  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  Ultrasound did not show any axillary adenopathy on exam. Of note she had a myocardial infarction at the end of December where she had stents placed and is currently on Brilinta.  She reports shortness of breath with climbing 1 flight of stairs.  Past Medical History Past Medical History:  Diagnosis Date  . Allergy   . CAD (coronary artery disease)   . Chicken pox   . Diabetes mellitus ([place])   . Heart murmur   . HFrEF (heart failure with reduced ejection fraction) ([place])   . Hypertension       Past Surgical History:  Procedure Laterality Date  . BREAST BIOPSY Right 11/04/2019   Affirm Bx- path pending- Ribbon Clip  . BREAST BIOPSY Left 11/04/2019   Affirm Bx- path pending- Coil Clip  . BREAST BIOPSY Left 11/12/2019   [place] bx/ ribbon clip/ path pending  . BREAST CYST ASPIRATION Left 11/12/2019   2 areas  . CORONARY STENT INTERVENTION N/A 08/09/2019   Procedure: CORONARY STENT INTERVENTION;  Surgeon: Nelva Bush, MD;  Location: [facility];  Service: Cardiovascular;  Laterality: N/A;  . LEFT HEART CATH AND CORONARY ANGIOGRAPHY N/A 08/09/2019   Procedure: LEFT HEART CATH AND CORONARY ANGIOGRAPHY;  Surgeon: Nelva Bush, MD;  Location: [facility];  Service: Cardiovascular;  Laterality: N/A;    No Known Allergies  Current Outpatient Medications  Medication Sig Dispense Refill  . amLODipine (NORVASC) 5 MG tablet Take 1 tablet (5 mg total) by mouth daily. 90  tablet 3  . BRILINTA 90 MG TABS tablet TAKE 1 TABLET (90 MG TOTAL) BY MOUTH 2 (TWO) TIMES DAILY. 180 tablet 0  . calcium carbonate (OS-CAL - DOSED IN MG OF ELEMENTAL CALCIUM) 1250 (500 Ca) MG tablet Take 1 tablet by mouth daily.    . carvedilol (COREG) 25 MG tablet Take 1 tablet (25 mg total) by mouth 2 (two) times daily. 180 tablet 3  . fenofibrate 54 MG tablet TAKE 1 TABLET BY MOUTH EVERY DAY 90 tablet 0  . Ferrous Sulfate (IRON) 325 (65 Fe) MG TABS Take 1 tablet (325 mg total) by mouth daily. 30 tablet 2  . losartan (COZAAR) 25 MG tablet Take 1 tablet (25 mg total) by mouth daily. 30 tablet 5  . nitrofurantoin, macrocrystal-monohydrate, (MACROBID) 100 MG capsule Take 1 capsule (100 mg total) by mouth 2 (two) times daily. 10 capsule 0  . Omega-3 Fatty Acids (FISH OIL) 500 MG CAPS Take 500 mg by mouth daily.    . potassium chloride SA (KLOR-CON M20) 20 MEQ tablet Take 1 tablet (20 mEq total) by mouth 2 (two) times daily. 180 tablet 3  . rosuvastatin (CRESTOR) 10 MG tablet TAKE 1 TABLET BY MOUTH EVERY DAY 90 tablet 0  . spironolactone (ALDACTONE) 25 MG tablet TAKE 1 TABLET BY MOUTH EVERY DAY 30 tablet 5   No current facility-administered medications for this visit.    Family History Family History  Problem Relation Age of Onset  .  Heart failure Mother   . Diabetes Mother   . Hypertension Mother   . Congestive Heart Failure Brother   . Hypertension Brother   . Cerebral palsy Son   . Congestive Heart Failure Brother   . Breast cancer Sister 45  . Thyroid cancer Sister       Social History Social History   Tobacco Use  . Smoking status: Never Smoker  . Smokeless tobacco: Never Used  Substance Use Topics  . Alcohol use: No    Alcohol/week: 0.0 standard drinks  . Drug use: No        Review of Systems  All other systems reviewed and are negative.    Physical Exam Blood pressure 112/70, pulse 85, temperature 97.6 F (36.4 C), resp. rate 12, height 5' 4" (1.626 m), weight  140 lb 12.8 oz (63.9 kg), SpO2 98 %. Last Weight  Most recent update: 11/30/2019  9:10 AM   Weight  63.9 kg (140 lb 12.8 oz)            CONSTITUTIONAL: Well developed, and nourished, appropriately responsive and aware without distress.   EYES: Sclera non-icteric.   EARS, NOSE, MOUTH AND THROAT: Mask worn.    Hearing is intact to voice.  NECK: Trachea is midline, and there is no jugular venous distension.  LYMPH NODES:  Lymph nodes in the neck are not enlarged. RESPIRATORY:  Lungs are clear, and breath sounds are equal bilaterally. Normal respiratory effort without pathologic use of accessory muscles. CARDIOVASCULAR: Heart is regular in rate and rhythm. GI: The abdomen is soft, nontender, and nondistended. There were no palpable masses. I did not appreciate hepatosplenomegaly. There were normal bowel sounds. GU: Left breast is unremarkable.  Right breast has a 2-1/2 cm mass in the outer lateral aspect, it is mobile but appears to dimple the adjacent overlying skin. MUSCULOSKELETAL:  Symmetrical muscle tone appreciated in all four extremities.    SKIN: Skin turgor is normal. No pathologic skin lesions appreciated.  NEUROLOGIC:  Motor and sensation appear grossly normal.  Cranial nerves are grossly without defect. PSYCH:  Alert and oriented to person, place and time. Affect is appropriate for situation.  Data Reviewed I have personally reviewed what is currently available of the patient's imaging, recent labs and medical records.   Labs:  CBC Latest Ref Rng & Units 10/29/2019 10/15/2019 10/14/2019  WBC 4.0 - 10.5 K/uL 8.4 9.9 8.6  Hemoglobin 12.0 - 15.0 g/dL 9.1(L) 8.3 Repeated and verified X2.(L) 8.5 Repeated and verified X2.(L)  Hematocrit 36.0 - 46.0 % 29.2(L) 24.6 Repeated and verified X2.(L) 25.9 Repeated and verified X2.(L)  Platelets 150 - 400 K/uL 509(H) 546.0(H) 561.0(H)   CMP Latest Ref Rng & Units 10/14/2019 08/10/2019 08/10/2019  Glucose 70 - 99 mg/dL 124(H) - 114(H)  BUN 6 - 23  mg/dL 25(H) - 9  Creatinine 0.40 - 1.20 mg/dL 1.12 - 0.75  Sodium 135 - 145 mEq/L 137 - 140  Potassium 3.5 - 5.1 mEq/L 5.0 3.9 3.0(L)  Chloride 96 - 112 mEq/L 105 - 107  CO2 19 - 32 mEq/L 25 - 23  Calcium 8.4 - 10.5 mg/dL 9.5 - 8.6(L)  Total Protein 6.0 - 8.3 g/dL 6.9 - -  Total Bilirubin 0.2 - 1.2 mg/dL 0.4 - -  Alkaline Phos 39 - 117 U/L 41 - -  AST 0 - 37 U/L 16 - -  ALT 0 - 35 U/L 12 - -      Imaging:  Within last 24 hrs: No  results found.  Assessment    Right breast cancer, greater than 2 cm in size, ER/PR positive and HER-2 negative. Patient Active Problem List   Diagnosis Date Noted  . Carcinoma of lower-outer quadrant of female breast, right ([place]) 11/06/2019  . Iron deficiency anemia 10/22/2019  . Type 2 diabetes mellitus without complication, without long-term current use of insulin ([place]) 10/14/2019  . CHF (congestive heart failure), NYHA class II, chronic, systolic ([place]) 02/54/2706  . Ischemic cardiomyopathy   . NSTEMI (non-ST elevated myocardial infarction) ([place]) 08/09/2019  . HLD (hyperlipidemia) 02/28/2017  . Environmental allergies 08/29/2015  . Essential hypertension 07/15/2014    Plan    We will proceed with right breast lumpectomy, foregoing any sentinel lymph node biopsy at this time.  This may give [place] more flexibility with anesthesia.  We will hold her Brilinta for 2 days preop. I believe a lumpectomy would require less surgical stressors, considering the patient's age and comorbidities this may be reasonable though not the ideal for long-term prevention of recurrence.  Radiation oncology may be utilized but may also be in excess.  I doubt there will be a role for chemotherapy, and the results of a sentinel lymph node may not change a course of therapy.  We will have further discussion prior to determining the surgical approach.  I did discuss the option of a mastectomy with the patient.  But we both concur that less is probably better in her scenario.   I  discussed risks of bleeding, infection, damage to surrounding tissues, having positive margins, needing additional resection, trauma to nerves causing arm numbness or difficulty raising arm, causing lymphoedema in the arm; as well as anesthesia risks of MI, stroke, prolonged ventilation, pulmonary embolism, thrombosis and even death.   Patient was given the opportunity to ask questions and have them answered.  They would like to proceed with right  breast lumpectomy.  Face-to-face time spent with the patient and accompanying care providers(if present) was 20 minutes, with more than 50% of the time spent counseling, educating, and coordinating care of the patient.      Ronny Bacon M.D., FACS 11/30/2019, 9:41 AM

## 2019-11-30 NOTE — Patient Instructions (Addendum)
Our surgery scheduler will contact you to schedule your surgery. Please have the blue sheet available when she calls you.  Our surgery scheduler will let you know when to stop the Brilinta. And after surgery, they will let you know when to resume Brilinta.   Please call the office if you have any questions or concerns.  Lumpectomy  A lumpectomy, sometimes called a partial mastectomy, is surgery to remove a cancerous tumor or mass (the lump) from a breast. It is a form of breast-conserving or breast-preservation surgery. This means that the cancerous tissue is removed but the breast remains intact. During a lumpectomy, the portion of the breast that contains the tumor is removed. Some normal tissue around the lump may be taken out to make sure that all of the tumor has been removed. Lymph nodes under your arm may also be removed and tested to find out if the cancer has spread. Lymph nodes are part of the body's disease-fighting system (immune system) and are usually the first place where breast cancer spreads. Tell a health care provider about:  Any allergies you have.  All medicines you are taking, including vitamins, herbs, eye drops, creams, and over-the-counter medicines.  Any problems you or family members have had with anesthetic medicines.  Any blood disorders you have.  Any surgeries you have had.  Any medical conditions you have.  Whether you are pregnant or may be pregnant. What are the risks? Generally, this is a safe procedure. However, problems may occur, including:  Bleeding.  Infection.  Allergic reaction to medicines.  Pain, swelling, weakness, or numbness in the arm on the side of your surgery.  Temporary swelling.  Change in the shape of the breast, particularly if a large portion is removed.  Scar tissue that forms at the surgical site and feels hard to the touch.  Blood clots. What happens before the procedure? Staying hydrated Follow instructions from  your health care provider about hydration, which may include:  Up to 2 hours before the procedure - you may continue to drink clear liquids, such as water, clear fruit juice, black coffee, and plain tea.  Eating and drinking restrictions Follow instructions from your health care provider about eating and drinking, which may include:  8 hours before the procedure - stop eating heavy meals or foods, such as meat, fried foods, or fatty foods.  6 hours before the procedure - stop eating light meals or foods, such as toast or cereal.  6 hours before the procedure - stop drinking milk or drinks that contain milk.  2 hours before the procedure - stop drinking clear liquids. Medicines Ask your health care provider about:  Changing or stopping your regular medicines. This is especially important if you are taking diabetes medicines or blood thinners.  Taking medicines such as aspirin and ibuprofen. These medicines can thin your blood. Do not take these medicines unless your health care provider tells you to take them.  Taking over-the-counter medicines, vitamins, herbs, and supplements. General instructions  Prior to surgery, your health care provider may do a procedure to locate and mark the tumor area in your breast (localization). This will help guide your surgeon to where the incision will be made. This may be done with: ? Imaging, such as a mammogram, ultrasound, or MRI. ? Insertion of a small wire, clip, or seed, or an implant that will reflect a radar signal.  You may have screening tests or exams to get baseline measurements of your arm. These  can be compared to measurements done after surgery to monitor for swelling (lymphedema) that can develop after having lymph nodes removed.  Ask your health care provider: ? How your surgery site will be marked. ? What steps will be taken to help prevent infection. These may include:  Washing skin with a germ-killing soap.  Taking antibiotic  medicine.  Plan to have someone take you home from the hospital or clinic.  Plan to have a responsible adult care for you for at least 24 hours after you leave the hospital or clinic. This is important. What happens during the procedure?   An IV will be inserted into one of your veins.  You will be given one or more of the following: ? A medicine to help you relax (sedative). ? A medicine to numb the area (local anesthetic). ? A medicine to make you fall asleep (general anesthetic).  Your health care provider will use a kind of electric scalpel that uses heat to reduce bleeding (electrocautery knife). A curved incision that follows the natural curve of your breast will be made. This type of incision will allow for minimal scarring and better healing.  The tumor will be removed along with some of the tissue around it. This will be sent to the lab for testing. Your health care provider may also remove lymph nodes at this time if needed.  If the tumor is close to the muscles over your chest, some muscle tissue may also be removed.  A small drain tube may be inserted into your breast area or armpit to collect fluid that may build up after surgery. This tube will be connected to a suction bulb on the outside of your body to remove the fluid.  The incision will be closed with stitches (sutures).  A bandage (dressing) may be placed over the incision. The procedure may vary among health care providers and hospitals. What happens after the procedure?  Your blood pressure, heart rate, breathing rate, and blood oxygen level will be monitored until you leave the hospital or clinic.  You will be given medicine for pain as needed.  Your IV will be removed when you are able to eat and drink by mouth.  You will be encouraged to get up and walk as soon as you can. This is important to improve blood flow and breathing. Ask for help if you feel weak or unsteady.  You may have: ? A drain tube in  place for 2-3 days to prevent a collection of blood (hematoma) from developing in the breast. You will be given instructions about caring for the drain before you go home. ? A pressure bandage applied for 1-2 days to prevent bleeding or swelling. Your pressure bandage may look like a thick piece of fabric or an elastic wrap. Ask your health care provider how to care for your bandage at home.  You may be given a tight sleeve to wear over your arm on the side of your surgery. You should wear this sleeve as told by your health care provider.  Do not drive for 24 hours if you were given a sedative during your procedure. Summary  A lumpectomy, sometimes called a partial mastectomy, is surgery to remove a cancerous tumor or mass (the lump) from a breast.  During a lumpectomy, the portion of the breast that contains the tumor is removed. Lymph nodes under your arm may also be removed and tested to find out if the cancer has spread.  Plan to  have someone take you home from the hospital or clinic.  You may have a drain tube in place for 2-3 days to prevent a collection of blood (hematoma) from developing in the breast. You will be given instructions about caring for the drain before you go home. This information is not intended to replace advice given to you by your health care provider. Make sure you discuss any questions you have with your health care provider. Document Revised: 02/01/2019 Document Reviewed: 02/01/2019 Elsevier Patient Education  [place].

## 2019-12-06 ENCOUNTER — Encounter
Admission: RE | Admit: 2019-12-06 | Discharge: 2019-12-06 | Disposition: A | Discharge status: Home / Self Care | Payer: Medicare HMO | Source: Ambulatory Visit | Attending: Surgery | Admitting: Surgery

## 2019-12-06 ENCOUNTER — Other Ambulatory Visit: Payer: Self-pay

## 2019-12-06 NOTE — Patient Instructions (Addendum)
COVID TESTING Date: 12-08-2019 [facility] Testing site:  [facility] [place] [address] Thru Hours:  [id number] am - 1:00 pm Once you are tested, you are asked to stay quarantined (avoiding public places) until after your surgery.   Your procedure is scheduled on: December 10, 2019 FRIDAY Report to Day Surgery on the 2nd floor of the [facility]. To find out your arrival time, please call [phone] between 1PM - 3PM on: Thursday April 79, 2021  REMEMBER: Instructions that are not followed completely may result in serious medical risk, up to and including death; or upon the discretion of your surgeon and anesthesiologist your surgery may need to be rescheduled.  Do not eat food after midnight the night before surgery.  No gum chewing, lozengers or hard candies.  You may however, drink CLEAR liquids up to 2 hours before you are scheduled to arrive for your surgery. Do not drink anything within 2 hours of your scheduled arrival time.  Clear liquids include: - water   Do NOT drink anything that is not on this list.  Type 1 and Type 2 diabetics should only drink water.    TAKE THESE MEDICATIONS THE MORNING OF SURGERY WITH A SIP OF WATER: AMLODIPINE ROSUVATATIN SPIRONALACTONE CARVEDILOL  DO NOT TAKE LOSARTAN THE MORNING OF SURGERY    Follow recommendations from Cardiologist, Pulmonologist or PCP regarding stopping Aspirin, Coumadin, Plavix, Eliquis, Pradaxa, or Pletal. LAST DOSE OF BRILINTA 12/07/2019 UNLESS INSTRUCTED DIFFERENTLY  Stop Anti-inflammatories (NSAIDS) such as Advil, Aleve, Ibuprofen, Motrin, Naproxen, Naprosyn and Aspirin based products such as Excedrin, Goodys Powder, BC Powder. (May take Tylenol or Acetaminophen if needed.)  Stop ANY OVER THE COUNTER supplements until after surgery. STOP FISH OIL (May continue Vitamin D, Vitamin B, and multivitamin.)  No Alcohol for 24 hours before or after surgery.  No Smoking including e-cigarettes for 24  hours prior to surgery.  No chewable tobacco products for at least 6 hours prior to surgery.  No nicotine patches on the day of surgery.  Do not use any "recreational" drugs for at least a week prior to your surgery.  Please be advised that the combination of cocaine and anesthesia may have negative outcomes, up to and including death. If you test positive for cocaine, your surgery will be cancelled.  On the morning of surgery brush your teeth with toothpaste and water, you may rinse your mouth with mouthwash if you wish. Do not swallow any toothpaste or mouthwash.  Do not wear jewelry, make-up, hairpins, clips or nail polish.  Do not wear lotions, powders, or perfumes.   Do not shave 48 hours prior to surgery.   Contact lenses, hearing aids and dentures may not be worn into surgery.  Do not bring valuables to the hospital. [facility] is not responsible for any missing/lost belongings or valuables.   Use CHG Soap  as directed on instruction sheet.  Notify your doctor if there is any change in your medical condition (cold, fever, infection).  Wear comfortable clothing (specific to your surgery type) to the hospital.  Plan for stool softeners for home use; pain medications have a tendency to cause constipation. You can also help prevent constipation by eating foods high in fiber such as fruits and vegetables and drinking plenty of fluids as your diet allows.  After surgery, you can help prevent lung complications by doing breathing exercises.  Take deep breaths and cough every 1-2 hours. Your doctor may order a device called an Chiropodist  to help you take deep breaths. When coughing or sneezing, hold a pillow firmly against your incision with both hands. This is called "splinting." Doing this helps protect your incision. It also decreases belly discomfort.  If you are being admitted to the hospital overnight, leave your suitcase in the car. After surgery it may be brought  to your room.  If you are being discharged the day of surgery, you will not be allowed to drive home. You will need a responsible adult (18 years or older) to drive you home and stay with you that night.   If you are taking public transportation, you will need to have a responsible adult (18 years or older) with you. Please confirm with your physician that it is acceptable to use public transportation.   Please call the [facility] at [phone] if you have any questions about these instructions.  Visitation Policy:  Patients undergoing a surgery or procedure may have one family member or support person with them as long as that person is not COVID-19 positive or experiencing its symptoms.  That person may remain in the waiting area during the procedure.  Children under 57 years of age may have both parents or legal guardians with them during their hospital stay.   Inpatient Visitation Update:  Two designated support people may visit a patient during visiting hours 7 am to 8 pm. It must be the same two designated people for the duration of the patient stay. The visitors may come and go during the day, and there is no switching out to have different visitors. A mask must be worn at all times, including in the patient room.

## 2019-12-06 NOTE — Pre-Procedure Instructions (Signed)
Pre-Admit Testing Provider Notification Note  Provider Notified: Dr. Fletcher Anon (Cardiologist)  Notification Mode: Fax  Reason: Request for Cardiology Clearance  Response: Fax confirmation received.  Additional Information: Placed on chart. Noted on Pre-admit Worksheet.  Signed: Beulah Gandy, RN

## 2019-12-07 ENCOUNTER — Telehealth: Payer: Self-pay | Admitting: Cardiovascular Disease

## 2019-12-07 NOTE — Telephone Encounter (Signed)
   [facility] Pre-operative Risk Assessment    Request for surgical clearance:  1. What type of surgery is being performed? RIGHT BREAST LUMPECTOMY  2. When is this surgery scheduled? 12/10/19  3. What type of clearance is required (medical clearance vs. Pharmacy clearance to hold med vs. Both)?  BOTH 4.  5. Are there any medications that need to be held prior to surgery and how long? BRILINTA  6. Practice name and name of physician performing surgery? DR. RODENBERG/[facility] PRE ADMIT TESTING  7. What is your office phone number [phone]   7.   What is your office fax number [phone]  8.   Anesthesia type (None, local, MAC, general) ? NOT LISTED   Janice Liu 12/07/2019, 10:27 AM  _________________________________________________________________   (provider comments below)

## 2019-12-07 NOTE — Telephone Encounter (Signed)
   Primary Cardiologist: Kathlyn Sacramento, MD  Chart reviewed as part of pre-operative protocol coverage.   Hx of NSTEMI 07/2019. Cardiac catheterization showed severe one-vessel coronary artery disease with 90% ulcerative stenosis in the mid LAD and moderate proximal LAD and distal RCA disease.  Ejection fraction was 40 to 45% with mildly elevated left ventricular end-diastolic pressure.  She underwent successful angioplasty and drug-eluting stent placement to the mid LAD. Recommended DAPT with ASA and Brillinta for at least 12 months.   Patient now requiring RIGHT BREAST LUMPECTOMY for cancer. Dr. Fletcher Anon please give your recommendations Brillinta? Not asking to hold ASA.   I have left voice mail to patient for call back.    [place], [place] 12/07/2019, 1:16 PM

## 2019-12-08 ENCOUNTER — Other Ambulatory Visit: Payer: Self-pay

## 2019-12-08 ENCOUNTER — Encounter
Admission: RE | Admit: 2019-12-08 | Discharge: 2019-12-08 | Disposition: A | Discharge status: Home / Self Care | Payer: Medicare HMO | Source: Ambulatory Visit | Attending: Surgery | Admitting: Surgery

## 2019-12-08 DIAGNOSIS — Z01818 Encounter for other preprocedural examination: Secondary | ICD-10-CM | POA: Diagnosis not present

## 2019-12-08 DIAGNOSIS — Z20822 Contact with and (suspected) exposure to covid-19: Secondary | ICD-10-CM | POA: Insufficient documentation

## 2019-12-08 DIAGNOSIS — I2510 Atherosclerotic heart disease of native coronary artery without angina pectoris: Secondary | ICD-10-CM | POA: Diagnosis not present

## 2019-12-08 LAB — CBC WITH DIFFERENTIAL/PLATELET
Abs Immature Granulocytes: 0.04 10*3/uL (ref 0.00–0.07)
Basophils Absolute: 0 10*3/uL (ref 0.0–0.1)
Basophils Relative: 0 %
Eosinophils Absolute: 0.2 10*3/uL (ref 0.0–0.5)
Eosinophils Relative: 2 %
HCT: 37.8 % (ref 36.0–46.0)
Hemoglobin: 12.3 g/dL (ref 12.0–15.0)
Immature Granulocytes: 0 %
Lymphocytes Relative: 28 %
Lymphs Abs: 2.5 10*3/uL (ref 0.7–4.0)
MCH: 28.9 pg (ref 26.0–34.0)
MCHC: 32.5 g/dL (ref 30.0–36.0)
MCV: 88.9 fL (ref 80.0–100.0)
Monocytes Absolute: 0.6 10*3/uL (ref 0.1–1.0)
Monocytes Relative: 7 %
Neutro Abs: 5.5 10*3/uL (ref 1.7–7.7)
Neutrophils Relative %: 63 %
Platelets: 393 10*3/uL (ref 150–400)
RBC: 4.25 MIL/uL (ref 3.87–5.11)
RDW: 14.8 % (ref 11.5–15.5)
WBC: 8.9 10*3/uL (ref 4.0–10.5)
nRBC: 0 % (ref 0.0–0.2)

## 2019-12-08 LAB — COMPREHENSIVE METABOLIC PANEL
ALT: 14 U/L (ref 0–44)
AST: 22 U/L (ref 15–41)
Albumin: 4.5 g/dL (ref 3.5–5.0)
Alkaline Phosphatase: 49 U/L (ref 38–126)
Anion gap: 7 (ref 5–15)
BUN: 20 mg/dL (ref 8–23)
CO2: 24 mmol/L (ref 22–32)
Calcium: 9.1 mg/dL (ref 8.9–10.3)
Chloride: 107 mmol/L (ref 98–111)
Creatinine, Ser: 1.08 mg/dL — ABNORMAL HIGH (ref 0.44–1.00)
GFR calc Af Amer: 57 mL/min — ABNORMAL LOW (ref >60)
GFR calc non Af Amer: 49 mL/min — ABNORMAL LOW (ref >60)
Glucose, Bld: 145 mg/dL — ABNORMAL HIGH (ref 70–99)
Potassium: 4.3 mmol/L (ref 3.5–5.1)
Sodium: 138 mmol/L (ref 135–145)
Total Bilirubin: 0.5 mg/dL (ref 0.3–1.2)
Total Protein: 7.9 g/dL (ref 6.5–8.1)

## 2019-12-08 LAB — SARS CORONAVIRUS 2 (TAT 6-24 HRS): SARS Coronavirus 2: NEGATIVE

## 2019-12-08 NOTE — Telephone Encounter (Signed)
Wellington Hampshire, MD sent to Michae Kava, CMA; [facility] Preop  I also agree that if they feel comfortable doing the surgery on dual antiplatelet therapy, we can hold Brilinta now and still proceed with surgery Friday.

## 2019-12-08 NOTE — Telephone Encounter (Signed)
Hold brilinta 5 days before and resume after. It seems that the surgery is urgent.

## 2019-12-08 NOTE — Telephone Encounter (Signed)
I have s/w Heather with Dr. Christian Mate in regards to holding Brilinta x 5 days prior to surgery. I had assured Nira Conn that we have sent notes to Dr. Fletcher Anon for his recommendations as well. I d/w Heather the pre op provider did also note (unless surgery can be done on dual antiplatelet therapy). Nira Conn will d/w Dr. Christian Mate to see if ok to proceed with pt being on DAPT or will they need to reschedule the surgery. I did tell Nira Conn once we hear back from Dr. Fletcher Anon with his recommendations we will let them know. Nira Conn thanked me for the call and the update.

## 2019-12-08 NOTE — Pre-Procedure Instructions (Signed)
REGARDING CLEARANCE  Secure chat sent to Dr. Christian Mate this morning regarding his preference related to the Cardiologist recommendations.    He had not responded as of 1520. I called his office and spoke with Caryl-lyn. She said that she would reach out to Dr. Christian Mate as well.   Awaiting decision from Dr. Christian Mate.

## 2019-12-08 NOTE — Telephone Encounter (Signed)
DPR ok to s/w pt's son Shanon Brow. I left a message for Shanon Brow in regards to pt's upcoming surgery 12/10/19 we have sent cardiology recommendations. I left message may want to reach out to Dr. Forest Becker office as well.

## 2019-12-08 NOTE — Telephone Encounter (Signed)
Foolow up  Pt's [name] returning call. DPR on file

## 2019-12-08 NOTE — Telephone Encounter (Signed)
   Primary Cardiologist: Kathlyn Sacramento, MD  Chart revisited as part of pre-operative protocol coverage. To summarize recommendations:  1) Given past medical history and time since last visit, based on ACC/AHA guidelines, Janice Liu would be at acceptable risk for the planned procedure without further cardiovascular testing.   2) Regarding Brilinta, Dr. Fletcher Anon has cleared the patient to hold 5 days prior to surgery. However since surgery is scheduled for 12/10/19 surgeon office will need to determine if comfortable to proceed with scheduled date with shorter holding period of antiplatelet or reschedule. Our nursing staff has been in contact with surgeon office regarding this.   I will route this recommendation to the requesting party via Epic fax function and remove from pre-op pool.  Please call with questions.  Janice Liu can you please call her [name] and inform that we are waiting to hear back from Dr. Luther Bradley to determine when they would like to proceed.      Reino Bellis, NP 12/08/2019, 10:21 AM

## 2019-12-08 NOTE — Telephone Encounter (Signed)
I will forward to pre op pool with Dr. Fletcher Anon recommendations.

## 2019-12-08 NOTE — Telephone Encounter (Signed)
[place] [facility] calling back to discuss   Please call

## 2019-12-08 NOTE — Telephone Encounter (Signed)
I tried to reach to reach Dr. Forest Becker office though they are already closed. I have left message to please call our office tomorrow and ask for the pre op team. We have been expecting a call back from Dr. Christian Mate to go over recommendations for the pt surgery.

## 2019-12-08 NOTE — Telephone Encounter (Signed)
   Primary Cardiologist: Kathlyn Sacramento, MD  Chart reviewed as part of pre-operative protocol coverage. Patient's [name]) returned Janice Liu call. (Patient was contacted 12/07/19 in reference to pre-operative risk assessment for pending surgery as outlined below.)  Janice Liu was last seen on 10/07/19 with Dr. Fletcher Anon.  Since that day, Janice Liu has done well from a cardiac standpoint without any new concerning cardiac symptoms. Cardiology is asked to clear patient to hold Brilinta however recently had PCI/DES in 12/20. Therefore we are not typically able to hold Brilinta so soon. Further complicating this, her procedure is scheduled for Friday which would not allow enough time to appropriately hold agent anyway. We are awaiting further input from Dr. Fletcher Anon.  Callback staff please inform surgeon that we would typically hold this medication at least 5 days prior to surgery therefore surgery date of 4/30 is not a sufficient time period. May need to look at rescheduling. (Unless surgery can be done on dual antiplatelet therapy). We will reach out when recommendation is received from Dr. Fletcher Anon.    Reino Bellis, NP 12/08/2019, 9:27 AM

## 2019-12-09 ENCOUNTER — Telehealth: Payer: Self-pay

## 2019-12-09 NOTE — Telephone Encounter (Signed)
Spoke with Dr Christian Mate and he is alright with the patient being off of Brilinta prior to surgery. He understands that she will not be able to be off the full 5 days prior. He is good to precede with surgery for Friday 12/10/19. I have spoken with Heather in Pre Admit testing about this.  I did call Dr Tyrell Antonio office yesterday and let them know this also.

## 2019-12-09 NOTE — Telephone Encounter (Signed)
Attempted to transfer Dr. Forest Becker nurse to pre op team.  Unable to reach .  MD is aware and ok to proceed.   See provider phone note. No need for call back per office.

## 2019-12-10 ENCOUNTER — Ambulatory Visit
Admission: RE | Admit: 2019-12-10 | Discharge: 2019-12-10 | Disposition: A | Discharge status: Home / Self Care | Payer: Medicare HMO | Attending: Surgery | Admitting: Surgery

## 2019-12-10 ENCOUNTER — Ambulatory Visit: Payer: Medicare HMO | Admitting: Anesthesiology

## 2019-12-10 ENCOUNTER — Other Ambulatory Visit: Payer: Self-pay

## 2019-12-10 ENCOUNTER — Encounter: Payer: Self-pay | Admitting: Surgery

## 2019-12-10 ENCOUNTER — Encounter
Admission: RE | Disposition: A | Discharge status: Home / Self Care | Payer: Self-pay | Source: Home / Self Care | Attending: Surgery

## 2019-12-10 DIAGNOSIS — Z170 Estrogen receptor positive status [ER+]: Secondary | ICD-10-CM

## 2019-12-10 DIAGNOSIS — C50511 Malignant neoplasm of lower-outer quadrant of right female breast: Secondary | ICD-10-CM

## 2019-12-10 DIAGNOSIS — Z955 Presence of coronary angioplasty implant and graft: Secondary | ICD-10-CM | POA: Insufficient documentation

## 2019-12-10 DIAGNOSIS — I5022 Chronic systolic (congestive) heart failure: Secondary | ICD-10-CM | POA: Diagnosis not present

## 2019-12-10 DIAGNOSIS — E119 Type 2 diabetes mellitus without complications: Secondary | ICD-10-CM | POA: Diagnosis not present

## 2019-12-10 DIAGNOSIS — I252 Old myocardial infarction: Secondary | ICD-10-CM | POA: Insufficient documentation

## 2019-12-10 DIAGNOSIS — Z79899 Other long term (current) drug therapy: Secondary | ICD-10-CM | POA: Diagnosis not present

## 2019-12-10 DIAGNOSIS — I110 Hypertensive heart disease with heart failure: Secondary | ICD-10-CM | POA: Diagnosis not present

## 2019-12-10 DIAGNOSIS — E785 Hyperlipidemia, unspecified: Secondary | ICD-10-CM | POA: Insufficient documentation

## 2019-12-10 DIAGNOSIS — I2510 Atherosclerotic heart disease of native coronary artery without angina pectoris: Secondary | ICD-10-CM | POA: Insufficient documentation

## 2019-12-10 DIAGNOSIS — Z7901 Long term (current) use of anticoagulants: Secondary | ICD-10-CM | POA: Diagnosis not present

## 2019-12-10 DIAGNOSIS — C50911 Malignant neoplasm of unspecified site of right female breast: Secondary | ICD-10-CM | POA: Diagnosis not present

## 2019-12-10 DIAGNOSIS — D509 Iron deficiency anemia, unspecified: Secondary | ICD-10-CM | POA: Diagnosis not present

## 2019-12-10 HISTORY — PX: BREAST LUMPECTOMY: SHX2

## 2019-12-10 LAB — GLUCOSE, CAPILLARY
Glucose-Capillary: 131 mg/dL — ABNORMAL HIGH (ref 70–99)
Glucose-Capillary: 101 mg/dL — ABNORMAL HIGH (ref 70–99)

## 2019-12-10 SURGERY — BREAST LUMPECTOMY
Anesthesia: General | Site: Breast | Laterality: Right

## 2019-12-10 MED ORDER — HYDROCODONE-ACETAMINOPHEN 5-325 MG PO TABS
1.0000 | ORAL_TABLET | Freq: Once | ORAL | Status: AC
  Administered 2019-12-10: 1 via ORAL

## 2019-12-10 MED ORDER — CHLORHEXIDINE GLUCONATE CLOTH 2 % EX PADS: 6.0000 | MEDICATED_PAD | Freq: Once | CUTANEOUS | Status: DC

## 2019-12-10 MED ORDER — GABAPENTIN 300 MG PO CAPS
ORAL_CAPSULE | ORAL | Status: AC
  Administered 2019-12-10: 300 mg via ORAL
  Filled 2019-12-10: qty 1

## 2019-12-10 MED ORDER — FAMOTIDINE 20 MG PO TABS
ORAL_TABLET | ORAL | Status: AC
  Administered 2019-12-10: 20 mg via ORAL
  Filled 2019-12-10: qty 1

## 2019-12-10 MED ORDER — PHENYLEPHRINE HCL (PRESSORS) 10 MG/ML IV SOLN
INTRAVENOUS | Status: DC | PRN
  Administered 2019-12-10 (×4): 100 ug via INTRAVENOUS

## 2019-12-10 MED ORDER — OXYCODONE HCL 5 MG PO TABS: 5.0000 mg | ORAL_TABLET | Freq: Once | ORAL | Status: DC | PRN

## 2019-12-10 MED ORDER — FAMOTIDINE 20 MG PO TABS: 20.0000 mg | ORAL_TABLET | Freq: Once | ORAL | Status: AC

## 2019-12-10 MED ORDER — ACETAMINOPHEN 500 MG PO TABS: 1000.0000 mg | ORAL_TABLET | ORAL | Status: AC

## 2019-12-10 MED ORDER — ONDANSETRON HCL 4 MG/2ML IJ SOLN
INTRAMUSCULAR | Status: DC | PRN
  Administered 2019-12-10: 4 mg via INTRAVENOUS

## 2019-12-10 MED ORDER — EPHEDRINE SULFATE 50 MG/ML IJ SOLN
INTRAMUSCULAR | Status: DC | PRN
  Administered 2019-12-10 (×3): 10 mg via INTRAVENOUS

## 2019-12-10 MED ORDER — PROPOFOL 10 MG/ML IV BOLUS
INTRAVENOUS | Status: DC | PRN
  Administered 2019-12-10: 100 mg via INTRAVENOUS

## 2019-12-10 MED ORDER — IBUPROFEN 800 MG PO TABS
800.0000 mg | ORAL_TABLET | Freq: Three times a day (TID) | ORAL | 0 refills | Status: DC | PRN
Start: 2019-12-10 — End: 2020-10-20

## 2019-12-10 MED ORDER — OXYCODONE HCL 5 MG/5ML PO SOLN: 5.0000 mg | Freq: Once | ORAL | Status: DC | PRN

## 2019-12-10 MED ORDER — PROPOFOL 10 MG/ML IV BOLUS
INTRAVENOUS | Status: AC
  Filled 2019-12-10: qty 40

## 2019-12-10 MED ORDER — BUPIVACAINE LIPOSOME 1.3 % IJ SUSP
INTRAMUSCULAR | Status: DC | PRN
  Administered 2019-12-10: 20 mL

## 2019-12-10 MED ORDER — BUPIVACAINE-EPINEPHRINE (PF) 0.25% -1:200000 IJ SOLN
INTRAMUSCULAR | Status: AC
  Filled 2019-12-10: qty 30

## 2019-12-10 MED ORDER — ACETAMINOPHEN 10 MG/ML IV SOLN: INTRAVENOUS | Status: DC | PRN

## 2019-12-10 MED ORDER — CEFAZOLIN SODIUM-DEXTROSE 2-4 GM/100ML-% IV SOLN
2.0000 g | INTRAVENOUS | Status: AC
  Administered 2019-12-10: 2 g via INTRAVENOUS

## 2019-12-10 MED ORDER — FENTANYL CITRATE (PF) 100 MCG/2ML IJ SOLN: 25.0000 ug | INTRAMUSCULAR | Status: DC | PRN

## 2019-12-10 MED ORDER — FENTANYL CITRATE (PF) 100 MCG/2ML IJ SOLN
INTRAMUSCULAR | Status: DC | PRN
  Administered 2019-12-10 (×3): 25 ug via INTRAVENOUS

## 2019-12-10 MED ORDER — FENTANYL CITRATE (PF) 100 MCG/2ML IJ SOLN
INTRAMUSCULAR | Status: AC
  Filled 2019-12-10: qty 2

## 2019-12-10 MED ORDER — SODIUM CHLORIDE 0.9 % IV SOLN: INTRAVENOUS | Status: DC

## 2019-12-10 MED ORDER — DEXAMETHASONE SODIUM PHOSPHATE 10 MG/ML IJ SOLN
INTRAMUSCULAR | Status: DC | PRN
  Administered 2019-12-10: 6 mg via INTRAVENOUS

## 2019-12-10 MED ORDER — SODIUM CHLORIDE 0.9 % IV SOLN: INTRAVENOUS | Status: DC | PRN

## 2019-12-10 MED ORDER — HYDROCODONE-ACETAMINOPHEN 5-325 MG PO TABS
ORAL_TABLET | ORAL | Status: AC
  Filled 2019-12-10: qty 1

## 2019-12-10 MED ORDER — ACETAMINOPHEN 500 MG PO TABS
ORAL_TABLET | ORAL | Status: AC
  Administered 2019-12-10: 1000 mg via ORAL
  Filled 2019-12-10: qty 2

## 2019-12-10 MED ORDER — ARTIFICIAL TEARS OPHTHALMIC OINT
TOPICAL_OINTMENT | OPHTHALMIC | Status: AC
  Filled 2019-12-10: qty 3.5

## 2019-12-10 MED ORDER — ONDANSETRON HCL 4 MG/2ML IJ SOLN: 4.0000 mg | Freq: Once | INTRAMUSCULAR | Status: DC | PRN

## 2019-12-10 MED ORDER — LACTATED RINGERS IV SOLN: INTRAVENOUS | Status: DC | PRN

## 2019-12-10 MED ORDER — CEFAZOLIN SODIUM-DEXTROSE 2-4 GM/100ML-% IV SOLN
INTRAVENOUS | Status: AC
  Filled 2019-12-10: qty 100

## 2019-12-10 MED ORDER — BUPIVACAINE LIPOSOME 1.3 % IJ SUSP: 20.0000 mL | Freq: Once | INTRAMUSCULAR | Status: DC

## 2019-12-10 MED ORDER — HYDROCODONE-ACETAMINOPHEN 5-325 MG PO TABS
1.0000 | ORAL_TABLET | Freq: Four times a day (QID) | ORAL | 0 refills | Status: DC | PRN
Start: 2019-12-10 — End: 2020-03-21

## 2019-12-10 MED ORDER — BUPIVACAINE LIPOSOME 1.3 % IJ SUSP
INTRAMUSCULAR | Status: AC
  Filled 2019-12-10: qty 20

## 2019-12-10 MED ORDER — LIDOCAINE HCL (CARDIAC) PF 100 MG/5ML IV SOSY
PREFILLED_SYRINGE | INTRAVENOUS | Status: DC | PRN
  Administered 2019-12-10: 60 mg via INTRAVENOUS

## 2019-12-10 MED ORDER — GABAPENTIN 300 MG PO CAPS: 300.0000 mg | ORAL_CAPSULE | ORAL | Status: AC

## 2019-12-10 MED ORDER — LIDOCAINE HCL (PF) 1 % IJ SOLN
INTRAMUSCULAR | Status: AC
  Filled 2019-12-10: qty 30

## 2019-12-10 SURGICAL SUPPLY — 34 items
APPLIER CLIP 9.375 SM OPEN (CLIP) ×2
BLADE SURG 15 STRL LF DISP TIS (BLADE) ×1 IMPLANT
BLADE SURG 15 STRL SS (BLADE) ×1
CANISTER SUCT 1200ML W/VALVE (MISCELLANEOUS) ×2 IMPLANT
CHLORAPREP W/TINT 26 (MISCELLANEOUS) ×2 IMPLANT
CLIP APPLIE 9.375 SM OPEN (CLIP) ×1 IMPLANT
CNTNR SPEC 2.5X3XGRAD LEK (MISCELLANEOUS)
CONT SPEC 4OZ STER OR WHT (MISCELLANEOUS)
CONTAINER SPEC 2.5X3XGRAD LEK (MISCELLANEOUS) IMPLANT
COVER WAND RF STERILE (DRAPES) ×2 IMPLANT
DECANTER SPIKE VIAL GLASS SM (MISCELLANEOUS) ×2 IMPLANT
DERMABOND ADVANCED (GAUZE/BANDAGES/DRESSINGS) ×1
DERMABOND ADVANCED .7 DNX12 (GAUZE/BANDAGES/DRESSINGS) ×1 IMPLANT
DEVICE DUBIN SPECIMEN MAMMOGRA (MISCELLANEOUS) ×2 IMPLANT
DRAPE LAPAROTOMY TRNSV 106X77 (MISCELLANEOUS) ×2 IMPLANT
ELECT CAUTERY BLADE TIP 2.5 (TIP) ×2
ELECT REM PT RETURN 9FT ADLT (ELECTROSURGICAL) ×2
ELECTRODE CAUTERY BLDE TIP 2.5 (TIP) ×1 IMPLANT
ELECTRODE REM PT RTRN 9FT ADLT (ELECTROSURGICAL) ×1 IMPLANT
GLOVE ORTHO TXT STRL SZ7.5 (GLOVE) ×2 IMPLANT
GOWN STRL REUS W/ TWL LRG LVL3 (GOWN DISPOSABLE) ×2 IMPLANT
GOWN STRL REUS W/TWL LRG LVL3 (GOWN DISPOSABLE) ×2
KIT MARKER MARGIN INK (KITS) ×2 IMPLANT
KIT TURNOVER KIT A (KITS) ×2 IMPLANT
MARKER MARGIN CORRECT CLIP (MARKER) IMPLANT
NEEDLE HYPO 22GX1.5 SAFETY (NEEDLE) ×2 IMPLANT
PACK BASIN MINOR (MISCELLANEOUS) ×2 IMPLANT
SUT MNCRL 4-0 (SUTURE) ×1
SUT MNCRL 4-0 27XMFL (SUTURE) ×1
SUT VIC AB 3-0 SH 27 (SUTURE) ×3
SUT VIC AB 3-0 SH 27X BRD (SUTURE) ×3 IMPLANT
SUTURE MNCRL 4-0 27XMF (SUTURE) ×1 IMPLANT
SYR 10ML LL (SYRINGE) ×2 IMPLANT
WATER STERILE IRR 1000ML POUR (IV SOLUTION) ×2 IMPLANT

## 2019-12-10 NOTE — Interval H&P Note (Signed)
History and Physical Interval Note:  12/10/2019 7:22 AM  Janice Liu  has presented today for surgery, with the diagnosis of Right breast cancer.  The various methods of treatment have been discussed with the patient and family. After consideration of risks, benefits and other options for treatment, the patient has consented to  Procedure(s): BREAST LUMPECTOMY (Right) as a surgical intervention.  The patient's history has been reviewed, patient examined, no change in status, stable for surgery.  I have reviewed the patient's chart and labs.  Questions were answered to the patient's satisfaction.   She's held her meds as requested.   Right upper breast is marked.   Ronny Bacon

## 2019-12-10 NOTE — Anesthesia Preprocedure Evaluation (Addendum)
Anesthesia Evaluation  Patient identified by MRN, date of birth, ID band Patient awake    Reviewed: Allergy & Precautions, NPO status , Patient's Chart, lab work & pertinent test results  History of Anesthesia Complications Negative for: history of anesthetic complications  Airway Mallampati: II  TM Distance: >3 FB Neck ROM: Full    Dental  (+) Edentulous Upper, Edentulous Lower   Pulmonary neg pulmonary ROS, neg sleep apnea, neg COPD, Patient abstained from smoking.Not current smoker,    Pulmonary exam normal breath sounds clear to auscultation       Cardiovascular Exercise Tolerance: Good METShypertension, + CAD, + Past MI and + Cardiac Stents  (-) dysrhythmias  Rhythm:Regular Rate:Normal - Systolic murmurs TTE [id number]: 1. Left ventricular ejection fraction, by estimation, is 50%. The left  ventricle has normal function. The left ventricle has hypokinesis of the  apical and apical septal, apical anterior region. Left ventricular  diastolic parameters are consistent with  Grade I diastolic dysfunction (impaired relaxation).  2. Right ventricular systolic function is normal. The right ventricular  size is normal.    Neuro/Psych negative neurological ROS  negative psych ROS   GI/Hepatic neg GERD  ,(+)     (-) substance abuse  ,   Endo/Other  diabetes  Renal/GU negative Renal ROS     Musculoskeletal   Abdominal   Peds  Hematology   Anesthesia Other Findings Past Medical History: No date: Allergy No date: CAD (coronary artery disease) No date: Chicken pox No date: Diabetes mellitus (HCC) No date: Heart murmur No date: HFrEF (heart failure with reduced ejection fraction) (HCC) No date: Hypertension  Reproductive/Obstetrics                           Anesthesia Physical Anesthesia Plan  ASA: III  Anesthesia Plan: General   Post-op Pain Management:    Induction: Intravenous  PONV  Risk Score and Plan: 3 and Ondansetron and Dexamethasone  Airway Management Planned: LMA  Additional Equipment: None  Intra-op Plan:   Post-operative Plan: Extubation in OR  Informed Consent: I have reviewed the patients History and Physical, chart, labs and discussed the procedure including the risks, benefits and alternatives for the proposed anesthesia with the patient or authorized representative who has indicated his/her understanding and acceptance.     Dental advisory given  Plan Discussed with: CRNA and Surgeon  Anesthesia Plan Comments: (Patient had DES placement 07/2019, has been off [place] for 3 days, and this has been OK'd per her cardiologist Dr Fletcher Anon per chart review.   Discussed risks of anesthesia with patient, including PONV, sore throat, lip/dental damage. Rare risks discussed as well, such as cardiorespiratory and neurological sequelae. I also advised patient about increased risk of perioperative cardiac events given her recent stent and being off [place] for 3 days, but that since this is a cancer-related procedure that risks/benefits of delaying surgery must be weighed, and both surgeon and cardiologist thought that the risks of delaying surgery outweighed the benefit. Patient understands.)        Anesthesia Quick Evaluation

## 2019-12-10 NOTE — Anesthesia Procedure Notes (Signed)
Procedure Name: LMA Insertion Date/Time: 12/10/2019 7:43 AM Performed by: Gayland Curry, CRNA Pre-anesthesia Checklist: Patient identified, Emergency Drugs available, Suction available and Patient being monitored Patient Re-evaluated:Patient Re-evaluated prior to induction Oxygen Delivery Method: Circle system utilized Preoxygenation: Pre-oxygenation with 100% oxygen Induction Type: IV induction LMA: LMA inserted LMA Size: 3.5 Number of attempts: 1 Tube secured with: Tape Dental Injury: Teeth and Oropharynx as per pre-operative assessment  Comments: Air-Qsp 3.5

## 2019-12-10 NOTE — Op Note (Signed)
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 General anesthesia was administered along with appropriate pre-operative antibiotics, and endotracheal intubation was performed by anesthetist. In supine position, operative site was prepped and draped in the usual sterile fashion, and following a brief time out, local anesthetic was then injected, and an right outer lower quadrant breast incision  was made using a #15 blade scalpel excising an ellipse of 3 cm , around which appropriate thickness skin flaps were created and excision was continued deep to chest wall to include the mass in its entirety with appropriately wide margins.  Hemostasis was confirmed/achieved, and the wound was re-approximated in layers using buried interrupted 3-0 Vicryl for dermis and 4-0 Monocryl running subcuticular suture to re-approximate epidermis.  Closure created a degree of dimpling of the skin laterally and inferiorly.  Due to approximation of the underlying deep tissues there will be a density present, due to the obliteration of the "dead space".  Dermabond skin glue was applied and allowed to dry.  Patient was then safely able to be extubated, awakened, and transferred to PACU for post-operative monitoring and care.   I was present for  all aspects of the above procedure, and no operative complications were apparent.   Ronny Bacon, M.D., [facility]  8:50 AM

## 2019-12-10 NOTE — Transfer of Care (Signed)
Immediate Anesthesia Transfer of Care Note  Patient: Janice Liu  Procedure(s) Performed: BREAST LUMPECTOMY (Right Breast)  Patient Location: PACU  Anesthesia Type:General  Level of Consciousness: oriented, drowsy and patient cooperative  Airway & Oxygen Therapy: Patient Spontanous Breathing and Patient connected to nasal cannula oxygen  Post-op Assessment: Report given to RN and Post -op Vital signs reviewed and stable  Post vital signs: Reviewed and stable  Last Vitals:  Vitals Value Taken Time  BP 110/58 12/10/19 0845  Temp    Pulse 71 12/10/19 0848  Resp    SpO2 98 % 12/10/19 0848  Vitals shown include unvalidated device data.  Last Pain:  Vitals:   12/10/19 0612  TempSrc: Temporal  PainSc: 0-No pain         Complications: No apparent anesthesia complications

## 2019-12-10 NOTE — Anesthesia Postprocedure Evaluation (Signed)
Anesthesia Post Note  Patient: Janice Liu  Procedure(s) Performed: BREAST LUMPECTOMY (Right Breast)  Patient location during evaluation: PACU Anesthesia Type: General Level of consciousness: awake and alert Pain management: pain level controlled Vital Signs Assessment: post-procedure vital signs reviewed and stable Respiratory status: spontaneous breathing, nonlabored ventilation, respiratory function stable and patient connected to nasal cannula oxygen Cardiovascular status: blood pressure returned to baseline and stable Postop Assessment: no apparent nausea or vomiting Anesthetic complications: no     Last Vitals:  Vitals:   12/10/19 0900 12/10/19 0915  BP: 102/60 (!) 111/58  Pulse: 76 68  Resp: 15 12  Temp:  36.8 C  SpO2: 98% 95%    Last Pain:  Vitals:   12/10/19 0915  TempSrc:   PainSc: 0-No pain                 Arita Miss

## 2019-12-13 ENCOUNTER — Telehealth: Payer: Self-pay | Admitting: *Deleted

## 2019-12-13 NOTE — Telephone Encounter (Signed)
Patient notified and will resume Brilinta today.

## 2019-12-13 NOTE — Telephone Encounter (Signed)
Patients husband Shanon Brow called and wants to know when patient can start back taking Brilinta. She had surgery on 12/10/19 Breast Lumpectomy Dr Christian Mate

## 2019-12-13 NOTE — Telephone Encounter (Signed)
Per Dr Christian Mate patient may resume Brilinta today.   Called patients husband with no answer. Left vm to call back to the office.

## 2019-12-14 LAB — SURGICAL PATHOLOGY

## 2019-12-16 ENCOUNTER — Encounter: Payer: Self-pay | Admitting: Surgery

## 2019-12-16 ENCOUNTER — Other Ambulatory Visit: Payer: Self-pay

## 2019-12-16 ENCOUNTER — Ambulatory Visit (INDEPENDENT_AMBULATORY_CARE_PROVIDER_SITE_OTHER): Payer: Self-pay | Admitting: Surgery

## 2019-12-16 VITALS — BP 128/78 | HR 76 | Temp 94.6°F | Ht 63.0 in | Wt 140.8 lb

## 2019-12-16 DIAGNOSIS — C50511 Malignant neoplasm of lower-outer quadrant of right female breast: Secondary | ICD-10-CM

## 2019-12-16 NOTE — Patient Instructions (Addendum)
Dr.Rodenberg discussed patient Lumpectomy finding and results at today's visit.  He recommends patient to wear any bra that provides comfort to the area.  Patient will follow up in three months.  Breast Self-Awareness Breast self-awareness means being familiar with how your breasts look and feel. It involves checking your breasts regularly and reporting any changes to your health care provider. Practicing breast self-awareness is important. Sometimes changes may not be harmful (are benign), but sometimes a change in your breasts can be a sign of a serious medical problem. It is important to learn how to do this procedure correctly so that you can catch problems early, when treatment is more likely to be successful. All women should practice breast self-awareness, including women who have had breast implants. What you need:  A mirror.  A well-lit room. How to do a breast self-exam A breast self-exam is one way to learn what is normal for your breasts and whether your breasts are changing. To do a breast self-exam: Look for changes  1. Remove all the clothing above your waist. 2. Stand in front of a mirror in a room with good lighting. 3. Put your hands on your hips. 4. Push your hands firmly downward. 5. Compare your breasts in the mirror. Look for differences between them (asymmetry), such as: ? Differences in shape. ? Differences in size. ? Puckers, dips, and bumps in one breast and not the other. 6. Look at each breast for changes in the skin, such as: ? Redness. ? Scaly areas. 7. Look for changes in your nipples, such as: ? Discharge. ? Bleeding. ? Dimpling. ? Redness. ? A change in position. Feel for changes Carefully feel your breasts for lumps and changes. It is best to do this while lying on your back on the floor, and again while sitting or standing in the tub or shower with soapy water on your skin. Feel each breast in the following way: 1. Place the arm on the side of the  breast you are examining above your head. 2. Feel your breast with the other hand. 3. Start in the nipple area and make -inch (2 cm) overlapping circles to feel your breast. Use the pads of your three middle fingers to do this. Apply light pressure, then medium pressure, then firm pressure. The light pressure will allow you to feel the tissue closest to the skin. The medium pressure will allow you to feel the tissue that is a little deeper. The firm pressure will allow you to feel the tissue close to the ribs. 4. Continue the overlapping circles, moving downward over the breast until you feel your ribs below your breast. 5. Move one finger-width toward the center of the body. Continue to use the -inch (2 cm) overlapping circles to feel your breast as you move slowly up toward your collarbone. 6. Continue the up-and-down exam using all three pressures until you reach your armpit.  Write down what you find Writing down what you find can help you remember what to discuss with your health care provider. Write down:  What is normal for each breast.  Any changes that you find in each breast, including: ? The kind of changes you find. ? Any pain or tenderness. ? Size and location of any lumps.  Where you are in your menstrual cycle, if you are still menstruating. General tips and recommendations  Examine your breasts every month.  If you are breastfeeding, the best time to examine your breasts is after a feeding  or after using a breast pump.  If you menstruate, the best time to examine your breasts is 5-7 days after your period. Breasts are generally lumpier during menstrual periods, and it may be more difficult to notice changes.  With time and practice, you will become more familiar with the variations in your breasts and more comfortable with the exam. Contact a health care provider if you:  See a change in the shape or size of your breasts or nipples.  See a change in the skin of your  breast or nipples, such as a reddened or scaly area.  Have unusual discharge from your nipples.  Find a lump or thick area that was not there before.  Have pain in your breasts.  Have any concerns related to your breast health. Summary  Breast self-awareness includes looking for physical changes in your breasts, as well as feeling for any changes within your breasts.  Breast self-awareness should be performed in front of a mirror in a well-lit room.  You should examine your breasts every month. If you menstruate, the best time to examine your breasts is 5-7 days after your menstrual period.  Let your health care provider know of any changes you notice in your breasts, including changes in size, changes on the skin, pain or tenderness, or unusual fluid from your nipples. This information is not intended to replace advice given to you by your health care provider. Make sure you discuss any questions you have with your health care provider. Document Revised: 03/17/2018 Document Reviewed: 03/17/2018 Elsevier Patient Education  [place].

## 2019-12-16 NOTE — Progress Notes (Signed)
[facility] POST-OP OFFICE VISIT  12/16/2019  HPI: Janice Liu is a 79 y.o. female 6 days s/p right breast outer lower quadrant partial mastectomy.  Her bra choices have not been comfortable, she would like to do without.  She reports tender to touch with compressive/supportive bras.  She denies any history of fevers or chills.  She notes its most uncomfortable at night.  Vital signs: BP 128/78   Pulse 76   Temp (!) 94.6 F (34.8 C) (Temporal)   Ht 5\' 3"  (1.6 m)   Wt 140 lb 12.8 oz (63.9 kg)   SpO2 98%   BMI 24.94 kg/m    Physical Exam: Constitutional: She appears at baseline, well. Right breast: Outer lower quadrant incision is intact, clean without erythema or induration.  Dermabond in place.  Pathology: Acellular mucin noted at the inferior and superior margins, therefore reported as positive margins. My note: Grossly I had normal tissue circumferentially, I doubt the benefit of reexcision in this 79 year old with mucinous breast carcinoma with low-grade DCIS is worthwhile considering the avoidance of radiation and utilizing only estrogen blockade for long-term treatment. My personal preference for this lady that we avoid reexcision, considering her other morbidities have been medication requirements.  Assessment/Plan: This is a 79 y.o. female 6 days s/p right breast partial mastectomy outer lower quadrant.  Patient Active Problem List   Diagnosis Date Noted  . 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 Iron deficiency anemia 10/22/2019  . Type 2 diabetes mellitus without complication, without long-term current use of insulin ([place]) 10/14/2019  . CHF (congestive heart failure), NYHA class II, chronic, systolic ([place]) 0000000  . Ischemic cardiomyopathy   . NSTEMI (non-ST elevated myocardial infarction) ([place]) 08/09/2019  . HLD (hyperlipidemia) 02/28/2017  . Environmental allergies 08/29/2015  . Essential hypertension  07/15/2014    -She has not developed a seroma, nor hematoma and appears to be managing well.  We discussed the potential recommendations for reexcision, and we both would prefer to avoid this as I believe the risks may outweigh the potential benefit.  She will be following up with oncology.  I want to see her back for clinical examination in 3 months, sooner as needed.   Ronny Bacon M.D., FACS 12/16/2019, 11:17 AM

## 2019-12-21 ENCOUNTER — Telehealth: Payer: Self-pay | Admitting: Surgery

## 2019-12-21 NOTE — Telephone Encounter (Signed)
Pt's son, Shanon Brow, called in an attempt to follow up on a new medication (possibly to do w/hormones) that the pt was to start post surgery, however she has not received any new rx from her pharmacy to date.  Pharmacy confirmed as [facility]) & he can be reached back @ [phone] for further clarification.  Thank you

## 2019-12-23 NOTE — Telephone Encounter (Signed)
Called patient's son, Shanon Brow, back and left a message. The medication he is speaking of is a hormone blocker and the [facility] would be handling this. I said for him to call Dr Gary Fleet office to schedule a follow up with him to discuss this medication. He may call back with any questions.

## 2019-12-24 ENCOUNTER — Telehealth: Payer: Self-pay | Admitting: *Deleted

## 2019-12-24 NOTE — Telephone Encounter (Signed)
YEs we need to see her.  If she had a lumpectomy please make an appointment with chrystal as well.

## 2019-12-24 NOTE — Telephone Encounter (Signed)
Patient [name] called reporting that patient had her breast surgery and was told she would need a hormone blocker, but surgeon said that we would need to determine and order that for her. She has no follow up appointment with our office and he is asking if she needs an appointment or any other treatment Please advise

## 2020-01-01 NOTE — Progress Notes (Signed)
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  INTERVAL HISTORY: Patient returns to clinic today for further evaluation, discussion of her final pathology results, and treatment planning.  She had a lumpectomy approximately 3 weeks ago revealing the above-stated malignancy.  She currently feels well and is nearly back to her baseline. She has no neurologic complaints.  She denies any recent fevers or illnesses.  She has a good appetite and denies weight loss. She has no chest pain, shortness of breath, cough, or hemoptysis.  She denies any nausea, vomiting, constipation, or diarrhea.  She has no melena or hematochezia.  She has no urinary complaints.  Patient offers no specific complaints today.  REVIEW OF SYSTEMS:   Review of Systems  Constitutional: Negative.  Negative for fever, malaise/fatigue and weight loss.  Respiratory: Negative.  Negative for cough and shortness of breath.   Cardiovascular: Negative.  Negative for chest pain and leg swelling.  Gastrointestinal: Negative.  Negative for abdominal pain, blood in stool and melena.  Genitourinary: Negative.  Negative for hematuria.  Musculoskeletal: Negative.  Negative for back pain.  Skin: Negative.  Negative for rash.  Neurological: Negative.  Negative for dizziness, focal weakness, weakness and headaches.  Psychiatric/Behavioral: Negative.  The patient is not nervous/anxious.     As per HPI. Otherwise, a complete review of systems is negative.  PAST MEDICAL HISTORY: Past Medical History:  Diagnosis Date  . Allergy   . CAD (coronary artery  disease)   . Chicken pox   . Diabetes mellitus ([place])   . Heart murmur   . HFrEF (heart failure with reduced ejection fraction) ([place])   . Hypertension     PAST SURGICAL HISTORY: Past Surgical History:  Procedure Laterality Date  . BREAST BIOPSY Right 11/04/2019   Affirm Bx- path pending- Ribbon Clip  . BREAST BIOPSY Left 11/04/2019   Affirm Bx- path pending- Coil Clip  . BREAST BIOPSY Left 11/12/2019   [place] bx/ ribbon clip/ path pending  . BREAST CYST ASPIRATION Left 11/12/2019   2 areas  . BREAST LUMPECTOMY Right 12/10/2019   Procedure: BREAST LUMPECTOMY;  Surgeon: Ronny Bacon, MD;  Location: [facility] ORS;  Service: General;  Laterality: Right;  . CORONARY STENT INTERVENTION N/A 08/09/2019   Procedure: CORONARY STENT INTERVENTION;  Surgeon: Nelva Bush, MD;  Location: [facility];  Service: Cardiovascular;  Laterality: N/A;  . LEFT HEART CATH AND CORONARY ANGIOGRAPHY N/A 08/09/2019   Procedure: LEFT HEART CATH AND CORONARY ANGIOGRAPHY;  Surgeon: Nelva Bush, MD;  Location: [facility];  Service: Cardiovascular;  Laterality: N/A;    FAMILY HISTORY: Family History  Problem Relation Age of Onset  . Heart failure Mother   . Diabetes Mother   . Hypertension Mother   . Congestive Heart Failure Brother   . Hypertension Brother   . Cerebral palsy Son   . Congestive Heart Failure Brother   . Breast cancer Sister 42  . Thyroid cancer Sister     ADVANCED DIRECTIVES (Y/N):  N  HEALTH MAINTENANCE: Social History   Tobacco Use  . Smoking status: Never Smoker  . Smokeless tobacco: Never Used  Substance  Use Topics  . Alcohol use: No    Alcohol/week: 0.0 standard drinks  . Drug use: No     Colonoscopy:  PAP:  Bone density:  Lipid panel:  No Known Allergies  Current Outpatient Medications  Medication Sig Dispense Refill  . amLODipine (NORVASC) 5 MG tablet Take 1 tablet (5 mg total) by mouth daily. 90 tablet 3  . BRILINTA 90 MG TABS tablet TAKE  1 TABLET (90 MG TOTAL) BY MOUTH 2 (TWO) TIMES DAILY. 180 tablet 0  . calcium carbonate (OS-CAL - DOSED IN MG OF ELEMENTAL CALCIUM) 1250 (500 Ca) MG tablet Take 500 mg by mouth daily.     . carvedilol (COREG) 25 MG tablet Take 1 tablet (25 mg total) by mouth 2 (two) times daily. 180 tablet 3  . fenofibrate 54 MG tablet TAKE 1 TABLET BY MOUTH EVERY DAY (Patient taking differently: Take 54 mg by mouth daily. ) 90 tablet 0  . Ferrous Sulfate (IRON) 325 (65 Fe) MG TABS Take 1 tablet (325 mg total) by mouth daily. 30 tablet 2  . HYDROcodone-acetaminophen (NORCO/VICODIN) 5-325 MG tablet Take 1 tablet by mouth every 6 (six) hours as needed for moderate pain. 15 tablet 0  . ibuprofen (ADVIL) 800 MG tablet Take 1 tablet (800 mg total) by mouth every 8 (eight) hours as needed. 30 tablet 0  . losartan (COZAAR) 25 MG tablet Take 1 tablet (25 mg total) by mouth daily. 30 tablet 5  . Omega-3 Fatty Acids (FISH OIL) 500 MG CAPS Take 500 mg by mouth daily.    . potassium chloride SA (KLOR-CON M20) 20 MEQ tablet Take 1 tablet (20 mEq total) by mouth daily. 90 tablet 3  . rosuvastatin (CRESTOR) 10 MG tablet TAKE 1 TABLET BY MOUTH EVERY DAY (Patient taking differently: Take 10 mg by mouth daily. ) 90 tablet 0  . spironolactone (ALDACTONE) 25 MG tablet TAKE 1 TABLET BY MOUTH EVERY DAY (Patient taking differently: Take 25 mg by mouth daily. ) 30 tablet 5  . acetaminophen (TYLENOL) 500 MG tablet Take 500-1,000 mg by mouth every 6 (six) hours as needed (for pain.).     No current facility-administered medications for this visit.    OBJECTIVE: Vitals:   01/07/20 0948  BP: (!) 119/57  Pulse: 79  Temp: 98.2 F (36.8 C)     Body mass index is 24.76 kg/m.    ECOG FS:0 - Asymptomatic  General: Well-developed, well-nourished, no acute distress. Eyes: Pink conjunctiva, anicteric sclera. HEENT: Normocephalic, moist mucous membranes. Breast: Exam deferred today. Lungs: No audible wheezing or coughing. Heart: Regular  rate and rhythm. Abdomen: Soft, nontender, no obvious distention. Musculoskeletal: No edema, cyanosis, or clubbing. Neuro: Alert, answering all questions appropriately. Cranial nerves grossly intact. Skin: No rashes or petechiae noted. Psych: Normal affect.   LAB RESULTS:  Lab Results  Component Value Date   NA 138 12/08/2019   K 4.3 12/08/2019   CL 107 12/08/2019   CO2 24 12/08/2019   GLUCOSE 145 (H) 12/08/2019   BUN 20 12/08/2019   CREATININE 1.08 (H) 12/08/2019   CALCIUM 9.1 12/08/2019   PROT 7.9 12/08/2019   ALBUMIN 4.5 12/08/2019   AST 22 12/08/2019   ALT 14 12/08/2019   ALKPHOS 49 12/08/2019   BILITOT 0.5 12/08/2019   GFRNONAA 49 (L) 12/08/2019   GFRAA 57 (L) 12/08/2019    Lab Results  Component Value Date   WBC 8.9 12/08/2019   NEUTROABS 5.5 12/08/2019   HGB 12.3 12/08/2019   HCT  37.8 12/08/2019   MCV 88.9 12/08/2019   PLT 393 12/08/2019   Lab Results  Component Value Date   IRON 124 10/29/2019   TIBC 515 (H) 10/29/2019   IRONPCTSAT 24 10/29/2019   Lab Results  Component Value Date   FERRITIN 21 10/29/2019     STUDIES: No results found.  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  Given the fact that she is grade 1 and mucinous, Oncotype DX is not necessary since patient likely will not require chemotherapy.  She will require adjuvant XRT and has an appointment with radiation oncology later this morning.  At the conclusion of her XRT, patient will require 5 years of letrozole.  Return to clinic in approximately 8 weeks at the end of her radiation treatments for further evaluation and initiation of letrozole. 2.  Iron deficiency anemia: Patient's most  recent hemoglobin was within normal limits at 12.3.  No intervention is needed at this time.  She last received IV Venofer on November 22, 2019.  Return to clinic as above.    I spent a total of 30 minutes reviewing chart data, face-to-face evaluation with the patient, counseling and coordination of care as detailed above.   Patient expressed understanding and was in agreement with this plan. She also understands that She can call clinic at any time with any questions, concerns, or complaints.   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

## 2020-01-06 ENCOUNTER — Ambulatory Visit: Payer: Medicare HMO | Admitting: Cardiovascular Disease

## 2020-01-06 ENCOUNTER — Other Ambulatory Visit: Payer: Self-pay

## 2020-01-06 ENCOUNTER — Encounter: Payer: Self-pay | Admitting: Oncology

## 2020-01-06 VITALS — BP 130/64 | HR 71 | Ht 63.0 in | Wt 140.4 lb

## 2020-01-06 DIAGNOSIS — I5022 Chronic systolic (congestive) heart failure: Secondary | ICD-10-CM | POA: Diagnosis not present

## 2020-01-06 DIAGNOSIS — I10 Essential (primary) hypertension: Secondary | ICD-10-CM | POA: Diagnosis not present

## 2020-01-06 DIAGNOSIS — E785 Hyperlipidemia, unspecified: Secondary | ICD-10-CM

## 2020-01-06 DIAGNOSIS — I2510 Atherosclerotic heart disease of native coronary artery without angina pectoris: Secondary | ICD-10-CM | POA: Diagnosis not present

## 2020-01-06 MED ORDER — POTASSIUM CHLORIDE CRYS ER 20 MEQ PO TBCR: 20.0000 meq | EXTENDED_RELEASE_TABLET | Freq: Every day | ORAL | 3 refills | Status: DC

## 2020-01-06 NOTE — Progress Notes (Signed)
Cardiology Office Note   Date:  01/06/2020   ID:  Janice Liu, DOB February 17, 1941, MRN [id number]  PCP:  Janice Fenton, NP  Cardiologist:   Kathlyn Sacramento, MD   Chief Complaint  Patient presents with  . office visit    Pt states stiffness in Left arm. Meds verbally reviewed w/ pt.       History of Present Illness: Janice Liu is a 79 y.o. female who presents for a follow-up visit regarding coronary artery disease and chronic systolic heart failure.  She has chronic medical conditions that include essential hypertension, hyperlipidemia and type 2 diabetes. She presented in December 2020 with non-ST elevation myocardial infarction.  Peak troponin was greater than 27,000.  Cardiac catheterization showed severe one-vessel coronary artery disease with 90% ulcerative stenosis in the mid LAD and moderate proximal LAD and distal RCA disease.  Ejection fraction was 40 to 45% with mildly elevated left ventricular end-diastolic pressure.  She underwent successful angioplasty and drug-eluting stent placement to the mid LAD.  She had a follow-up echocardiogram in March that showed improvement in ejection fraction to 50%.  She was diagnosed with right breast cancer and underwent right lumpectomy the end of April without complications.  She has been now on ticagrelor without aspirin due to some bleeding issues.  She denies chest pain or shortness of breath.   Past Medical History:  Diagnosis Date  . Allergy   . CAD (coronary artery disease)   . Chicken pox   . Diabetes mellitus ([place])   . Heart murmur   . HFrEF (heart failure with reduced ejection fraction) ([place])   . Hypertension     Past Surgical History:  Procedure Laterality Date  . BREAST BIOPSY Right 11/04/2019   Affirm Bx- path pending- Ribbon Clip  . BREAST BIOPSY Left 11/04/2019   Affirm Bx- path pending- Coil Clip  . BREAST BIOPSY Left 11/12/2019   [place] bx/ ribbon clip/ path pending  . BREAST CYST ASPIRATION Left 11/12/2019   2 areas  . BREAST LUMPECTOMY Right 12/10/2019   Procedure: BREAST LUMPECTOMY;  Surgeon: Ronny Bacon, MD;  Location: [facility];  Service: General;  Laterality: Right;  . CORONARY STENT INTERVENTION N/A 08/09/2019   Procedure: CORONARY STENT INTERVENTION;  Surgeon: Nelva Bush, MD;  Location: [place] [facility];  Service: Cardiovascular;  Laterality: N/A;  . LEFT HEART CATH AND CORONARY ANGIOGRAPHY N/A 08/09/2019   Procedure: LEFT HEART CATH AND CORONARY ANGIOGRAPHY;  Surgeon: Nelva Bush, MD;  Location: [place] [facility];  Service: Cardiovascular;  Laterality: N/A;     Current Outpatient Medications  Medication Sig Dispense Refill  . acetaminophen (TYLENOL) 500 MG tablet Take 500-1,000 mg by mouth every 6 (six) hours as needed (for pain.).    Marland Kitchen amLODipine (NORVASC) 5 MG tablet Take 1 tablet (5 mg total) by mouth daily. 90 tablet 3  . BRILINTA 90 MG TABS tablet TAKE 1 TABLET (90 MG TOTAL) BY MOUTH 2 (TWO) TIMES DAILY. 180 tablet 0  . calcium carbonate (OS-CAL - DOSED IN MG OF ELEMENTAL CALCIUM) 1250 (500 Ca) MG tablet Take 500 mg by mouth daily.     . carvedilol (COREG) 25 MG tablet Take 1 tablet (25 mg total) by mouth 2 (two) times daily. 180 tablet 3  . fenofibrate 54 MG tablet TAKE 1 TABLET BY MOUTH EVERY DAY (Patient taking differently: Take 54 mg by mouth daily. ) 90 tablet 0  . Ferrous Sulfate (IRON) 325 (65 Fe) MG TABS Take  1 tablet (325 mg total) by mouth daily. 30 tablet 2  . HYDROcodone-acetaminophen (NORCO/VICODIN) 5-325 MG tablet Take 1 tablet by mouth every 6 (six) hours as needed for moderate pain. 15 tablet 0  . ibuprofen (ADVIL) 800 MG tablet Take 1 tablet (800 mg total) by mouth every 8 (eight) hours as needed. 30 tablet 0  . losartan (COZAAR) 25 MG tablet Take 1 tablet (25 mg total) by mouth daily. 30 tablet 5  . Omega-3 Fatty Acids (FISH OIL) 500 MG CAPS Take 500 mg by mouth daily.    . potassium chloride SA (KLOR-CON M20) 20 MEQ tablet Take 1 tablet (20 mEq  total) by mouth 2 (two) times daily. 180 tablet 3  . rosuvastatin (CRESTOR) 10 MG tablet TAKE 1 TABLET BY MOUTH EVERY DAY (Patient taking differently: Take 10 mg by mouth daily. ) 90 tablet 0  . spironolactone (ALDACTONE) 25 MG tablet TAKE 1 TABLET BY MOUTH EVERY DAY (Patient taking differently: Take 25 mg by mouth daily. ) 30 tablet 5   No current facility-administered medications for this visit.    Allergies:   Patient has no known allergies.    Social History:  The patient  reports that she has never smoked. She has never used smokeless tobacco. She reports that she does not drink alcohol or use drugs.   Family History:  The patient's family history includes Breast cancer (age of onset: 92) in her sister; Cerebral palsy in her son; Congestive Heart Failure in her brother and brother; Diabetes in her mother; Heart failure in her mother; Hypertension in her brother and mother; Thyroid cancer in her sister.    ROS:  Please see the history of present illness.   Otherwise, review of systems are positive for none.   All other systems are reviewed and negative.    PHYSICAL EXAM: VS:  BP 130/64 (BP Location: Left Arm, Patient Position: Sitting, Cuff Size: Normal)   Pulse 71   Ht 5\' 3"  (1.6 m)   Wt 140 lb 6 oz (63.7 kg)   SpO2 98%   BMI 24.87 kg/m  , BMI Body mass index is 24.87 kg/m. GEN: Well nourished, well developed, in no acute distress  HEENT: normal  Neck: no JVD, carotid bruits, or masses Cardiac: RRR; no murmurs, rubs, or gallops,no edema  Respiratory:  clear to auscultation bilaterally, normal work of breathing GI: soft, nontender, nondistended, + BS MS: no deformity or atrophy  Skin: warm and dry, no rash Neuro:  Strength and sensation are intact Psych: euthymic mood, full affect   EKG:  EKG is ordered today. The ekg ordered today demonstrates normal sinus rhythm with no significant ST or T wave changes.   Recent Labs: 08/09/2019: Magnesium 2.0; TSH 1.892 12/08/2019:  ALT 14; BUN 20; Creatinine, Ser 1.08; Hemoglobin 12.3; Platelets 393; Potassium 4.3; Sodium 138    Lipid Panel    Component Value Date/Time   CHOL 118 10/14/2019 1058   CHOL 274 (H) 08/15/2015 0803   TRIG 153.0 (H) 10/14/2019 1058   HDL 42.60 10/14/2019 1058   HDL 38 (L) 08/15/2015 0803   CHOLHDL 3 10/14/2019 1058   VLDL 30.6 10/14/2019 1058   LDLCALC 45 10/14/2019 1058   LDLCALC 184 (H) 08/15/2015 0803   LDLDIRECT 87.0 07/01/2019 0916      Wt Readings from Last 3 Encounters:  01/06/20 140 lb 6 oz (63.7 kg)  12/16/19 140 lb 12.8 oz (63.9 kg)  12/10/19 139 lb (63 kg)  ASSESSMENT AND PLAN:  1.  Coronary artery disease involving native coronary arteries without angina: She is doing extremely well at the present time after LAD PCI in December.  Continue treatment with ticagrelor without aspirin for now.  She does complain of easy bruising.  I will consider stopping ticagrelor in December and starting her on aspirin 81 mg daily at that time.  2.  Chronic systolic heart failure due to ischemic cardiomyopathy: Ejection fraction was 40 to 45% during myocardial infarction but improved subsequently to 50%.  No evidence of volume overload.  Continue treatment with carvedilol, losartan and spironolactone.  I decreased potassium chloride to once daily instead of twice daily and ultimately we should be able to stop this medication.  3.  Essential hypertension: Blood pressure is well controlled on current medications  4.  Hyperlipidemia: Currently on rosuvastatin 10 mg daily, fenofibrate and fish oil.  Most recent lipid profile showed an LDL of 45 and triglyceride of 153.   Disposition:   Follow-up in 6 months.   Signed,  Kathlyn Sacramento, MD  01/06/2020 2:46 PM    [place]

## 2020-01-06 NOTE — Patient Instructions (Signed)
Medication Instructions:  1- DECREASE Potassium Chloride 1 tablet once daily *If you need a refill on your cardiac medications before your next appointment, please call your pharmacy*   Lab Work: None ordered  If you have labs (blood work) drawn today and your tests are completely normal, you will receive your results only by: Marland Kitchen MyChart Message (if you have MyChart) OR . A paper copy in the mail If you have any lab test that is abnormal or we need to change your treatment, we will call you to review the results.   Testing/Procedures: None ordered    Follow-Up: At [facility], you and your health needs are our priority.  As part of our continuing mission to provide you with exceptional heart care, we have created designated Provider Care Teams.  These Care Teams include your primary Cardiologist (physician) and Advanced Practice Providers (APPs -  Physician Assistants and Nurse Practitioners) who all work together to provide you with the care you need, when you need it.  We recommend signing up for the patient portal called "MyChart".  Sign up information is provided on this After Visit Summary.  MyChart is used to connect with patients for Virtual Visits (Telemedicine).  Patients are able to view lab/test results, encounter notes, upcoming appointments, etc.  Non-urgent messages can be sent to your provider as well.   To learn more about what you can do with MyChart, go to [email or web address].    Your next appointment:   6 month(s)  The format for your next appointment:   In Person  Provider:    You may see Kathlyn Sacramento, MD or one of the following Advanced Practice Providers on your designated Care Team:    Murray Hodgkins, NP  Christell Faith, PA-C  Marrianne Mood, PA-C    Other Instructions

## 2020-01-06 NOTE — Progress Notes (Signed)
Spoke with pts  Son for preview did not state any concerns, but did mention a change in her potassium medication to once daily.

## 2020-01-07 ENCOUNTER — Ambulatory Visit
Admission: RE | Admit: 2020-01-07 | Discharge: 2020-01-07 | Disposition: A | Discharge status: Home / Self Care | Payer: Medicare HMO | Source: Ambulatory Visit | Attending: Radiation Oncology | Admitting: Radiation Oncology

## 2020-01-07 ENCOUNTER — Inpatient Hospital Stay: Payer: Medicare HMO | Attending: Oncology | Admitting: Oncology

## 2020-01-07 VITALS — BP 119/57 | HR 79 | Temp 98.2°F | Wt 139.8 lb

## 2020-01-07 DIAGNOSIS — I110 Hypertensive heart disease with heart failure: Secondary | ICD-10-CM | POA: Diagnosis not present

## 2020-01-07 DIAGNOSIS — D509 Iron deficiency anemia, unspecified: Secondary | ICD-10-CM | POA: Insufficient documentation

## 2020-01-07 DIAGNOSIS — Z791 Long term (current) use of non-steroidal anti-inflammatories (NSAID): Secondary | ICD-10-CM | POA: Insufficient documentation

## 2020-01-07 DIAGNOSIS — E119 Type 2 diabetes mellitus without complications: Secondary | ICD-10-CM | POA: Diagnosis not present

## 2020-01-07 DIAGNOSIS — Z170 Estrogen receptor positive status [ER+]: Secondary | ICD-10-CM | POA: Insufficient documentation

## 2020-01-07 DIAGNOSIS — Z79899 Other long term (current) drug therapy: Secondary | ICD-10-CM | POA: Insufficient documentation

## 2020-01-07 DIAGNOSIS — I509 Heart failure, unspecified: Secondary | ICD-10-CM | POA: Insufficient documentation

## 2020-01-07 DIAGNOSIS — C50511 Malignant neoplasm of lower-outer quadrant of right female breast: Secondary | ICD-10-CM | POA: Insufficient documentation

## 2020-01-07 DIAGNOSIS — I2510 Atherosclerotic heart disease of native coronary artery without angina pectoris: Secondary | ICD-10-CM | POA: Insufficient documentation

## 2020-01-07 NOTE — Consult Note (Signed)
NEW PATIENT EVALUATION  Name: Janice Liu  MRN: [id number]  Date:   01/07/2020     DOB: 17-Nov-1940   This 79 y.o. female patient presents to the clinic for initial evaluation of stage IIa (T2 NX M0).  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  Patient has positive margins by involvement of mucin as well as no sentinel lymph nodes were examined.  REFERRING PHYSICIAN: Jearld Fenton, NP  CHIEF COMPLAINT:  Chief Complaint  Patient presents with  . Breast Cancer    DIAGNOSIS: The encounter diagnosis was Carcinoma of lower-outer quadrant of female breast, right ([place]).   PREVIOUS INVESTIGATIONS:  Pathology report reviewed Mammogram and ultrasound reviewed Clinical notes reviewed  HPI: Patient is a 79 year old female who presented with self discovered mass in the right breast.  Mammogram and ultrasound demonstrated a 2.6 x 2.8 mass 8 o'clock position 3 cm from the nipple.  Ultrasound of the axilla showed left axilla to be negative.  Patient underwent ultrasound-guided biopsy which was positive for mucinous ER/PR positive HER-2 negative invasive mammary carcinoma.  Patient underwent wide local excision without sentinel node biopsy.  Tumor was 2.8 x 2.6 x 2.7 cm overall grade 1 mucinous carcinoma.  Lymphovascular invasion was not identified.  Margins were positive inferior and superior for acellular mucin with which pathology called a positive margin.  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  She has been seen by medical oncology and recommendation is for antiestrogen therapy.  Her case has been reviewed by her surgeon who does not feel reexcision would be of value she is now referred to radiation oncology for consideration of treatment she is doing well she still having some slight tenderness.  She specifically denies bone pain cough or any tenderness in the breast.  PLANNED TREATMENT REGIMEN: Hypofractionated  right whole breast radiation  PAST MEDICAL HISTORY:  has a past medical history of Allergy, CAD (coronary artery disease), Chicken pox, Diabetes mellitus ([place]), Heart murmur, HFrEF (heart failure with reduced ejection fraction) ([place]), and Hypertension.    PAST SURGICAL HISTORY:  Past Surgical History:  Procedure Laterality Date  . BREAST BIOPSY Right 11/04/2019   Affirm Bx- path pending- Ribbon Clip  . BREAST BIOPSY Left 11/04/2019   Affirm Bx- path pending- Coil Clip  . BREAST BIOPSY Left 11/12/2019   [place] bx/ ribbon clip/ path pending  . BREAST CYST ASPIRATION Left 11/12/2019   2 areas  . BREAST LUMPECTOMY Right 12/10/2019   Procedure: BREAST LUMPECTOMY;  Surgeon: Ronny Bacon, MD;  Location: [facility];  Service: General;  Laterality: Right;  . CORONARY STENT INTERVENTION N/A 08/09/2019   Procedure: CORONARY STENT INTERVENTION;  Surgeon: Nelva Bush, MD;  Location: [place] [facility];  Service: Cardiovascular;  Laterality: N/A;  . LEFT HEART CATH AND CORONARY ANGIOGRAPHY N/A 08/09/2019   Procedure: LEFT HEART CATH AND CORONARY ANGIOGRAPHY;  Surgeon: Nelva Bush, MD;  Location: [place] [facility];  Service: Cardiovascular;  Laterality: N/A;    FAMILY HISTORY: family history includes Breast cancer (age of onset: 77) in her sister; Cerebral palsy in her son; Congestive Heart Failure in her brother and brother; Diabetes in her mother; Heart failure in her mother; Hypertension in her brother and mother; Thyroid cancer in her sister.  SOCIAL HISTORY:  reports that she has never smoked. She has never used smokeless tobacco. She reports that she does not drink alcohol or use drugs.  ALLERGIES:  Patient has no known allergies.  MEDICATIONS:  Current Outpatient Medications  Medication Sig Dispense Refill  . acetaminophen (TYLENOL) 500 MG tablet Take 500-1,000 mg by mouth every 6 (six) hours as needed (for pain.).    Marland Kitchen amLODipine (NORVASC) 5 MG tablet Take 1 tablet (5 mg total)  by mouth daily. 90 tablet 3  . BRILINTA 90 MG TABS tablet TAKE 1 TABLET (90 MG TOTAL) BY MOUTH 2 (TWO) TIMES DAILY. 180 tablet 0  . calcium carbonate (OS-CAL - DOSED IN MG OF ELEMENTAL CALCIUM) 1250 (500 Ca) MG tablet Take 500 mg by mouth daily.     . carvedilol (COREG) 25 MG tablet Take 1 tablet (25 mg total) by mouth 2 (two) times daily. 180 tablet 3  . fenofibrate 54 MG tablet TAKE 1 TABLET BY MOUTH EVERY DAY (Patient taking differently: Take 54 mg by mouth daily. ) 90 tablet 0  . Ferrous Sulfate (IRON) 325 (65 Fe) MG TABS Take 1 tablet (325 mg total) by mouth daily. 30 tablet 2  . HYDROcodone-acetaminophen (NORCO/VICODIN) 5-325 MG tablet Take 1 tablet by mouth every 6 (six) hours as needed for moderate pain. 15 tablet 0  . ibuprofen (ADVIL) 800 MG tablet Take 1 tablet (800 mg total) by mouth every 8 (eight) hours as needed. 30 tablet 0  . losartan (COZAAR) 25 MG tablet Take 1 tablet (25 mg total) by mouth daily. 30 tablet 5  . Omega-3 Fatty Acids (FISH OIL) 500 MG CAPS Take 500 mg by mouth daily.    . potassium chloride SA (KLOR-CON M20) 20 MEQ tablet Take 1 tablet (20 mEq total) by mouth daily. 90 tablet 3  . rosuvastatin (CRESTOR) 10 MG tablet TAKE 1 TABLET BY MOUTH EVERY DAY (Patient taking differently: Take 10 mg by mouth daily. ) 90 tablet 0  . spironolactone (ALDACTONE) 25 MG tablet TAKE 1 TABLET BY MOUTH EVERY DAY (Patient taking differently: Take 25 mg by mouth daily. ) 30 tablet 5   No current facility-administered medications for this encounter.    ECOG PERFORMANCE STATUS:  0 - Asymptomatic  REVIEW OF SYSTEMS: Patient denies any weight loss, fatigue, weakness, fever, chills or night sweats. Patient denies any loss of vision, blurred vision. Patient denies any ringing  of the ears or hearing loss. No irregular heartbeat. Patient denies heart murmur or history of fainting. Patient denies any chest pain or pain radiating to her upper extremities. Patient denies any shortness of breath,  difficulty breathing at night, cough or hemoptysis. Patient denies any swelling in the lower legs. Patient denies any nausea vomiting, vomiting of blood, or coffee ground material in the vomitus. Patient denies any stomach pain. Patient states has had normal bowel movements no significant constipation or diarrhea. Patient denies any dysuria, hematuria or significant nocturia. Patient denies any problems walking, swelling in the joints or loss of balance. Patient denies any skin changes, loss of hair or loss of weight. Patient denies any excessive worrying or anxiety or significant depression. Patient denies any problems with insomnia. Patient denies excessive thirst, polyuria, polydipsia. Patient denies any swollen glands, patient denies easy bruising or easy bleeding. Patient denies any recent infections, allergies or URI. Patient "s visual fields have not changed significantly in recent time.   PHYSICAL EXAM: There were no vitals taken for this visit. Patient is status post wide local excision of the right breast with incision healing well.  No dominant mass or nodularity is noted in either breast in 2 positions examined.  No axillary or  supraclavicular adenopathy is appreciated.  Well-developed well-nourished patient in NAD. HEENT reveals PERLA, EOMI, discs not visualized.  Oral cavity is clear. No oral mucosal lesions are identified. Neck is clear without evidence of cervical or supraclavicular adenopathy. Lungs are clear to A&P. Cardiac examination is essentially unremarkable with regular rate and rhythm without murmur rub or thrill. Abdomen is benign with no organomegaly or masses noted. Motor sensory and DTR levels are equal and symmetric in the upper and lower extremities. Cranial nerves II through XII are grossly intact. Proprioception is intact. No peripheral adenopathy or edema is identified. No motor or sensory levels are noted. Crude visual fields are within normal range.  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  PLAN: I have run the [id number] which shows approximately 10% chance of sentinel node involvement with the parameters of her tumor.  Do not think this justifies axillary lymph node radiation.  I would plan a hypofractionated course over 3 weeks to her whole breast boosting her scar another 1000 cGy in 5 fractions based on the acellular mucin at the margin.  Risks and benefits of treatment including skin reaction fatigue alteration of blood counts possible inclusion of superficial lung all were described in detail to the patient and her son.  They both seem to comprehend my treatment plan well.  I have personally set up and ordered CT simulation for next week.  Patient also will benefit from antiestrogen therapy after completion of radiation.  I would like to take this opportunity to thank you for allowing me to participate in the care of your patient.Noreene Filbert, MD

## 2020-01-12 ENCOUNTER — Ambulatory Visit
Admission: RE | Admit: 2020-01-12 | Discharge: 2020-01-12 | Disposition: A | Discharge status: Home / Self Care | Payer: Medicare HMO | Source: Ambulatory Visit | Attending: Radiation Oncology | Admitting: Radiation Oncology

## 2020-01-12 DIAGNOSIS — Z170 Estrogen receptor positive status [ER+]: Secondary | ICD-10-CM | POA: Diagnosis not present

## 2020-01-12 DIAGNOSIS — C50511 Malignant neoplasm of lower-outer quadrant of right female breast: Secondary | ICD-10-CM | POA: Insufficient documentation

## 2020-01-12 DIAGNOSIS — Z510 Encounter for antineoplastic radiation therapy: Secondary | ICD-10-CM | POA: Diagnosis present

## 2020-01-13 ENCOUNTER — Ambulatory Visit: Payer: Medicare HMO | Admitting: Cardiovascular Disease

## 2020-01-14 ENCOUNTER — Other Ambulatory Visit: Payer: Self-pay | Admitting: *Deleted

## 2020-01-14 DIAGNOSIS — C50511 Malignant neoplasm of lower-outer quadrant of right female breast: Secondary | ICD-10-CM

## 2020-01-17 ENCOUNTER — Other Ambulatory Visit: Payer: Self-pay | Admitting: Internal Medicine

## 2020-01-18 DIAGNOSIS — C50511 Malignant neoplasm of lower-outer quadrant of right female breast: Secondary | ICD-10-CM | POA: Diagnosis not present

## 2020-01-18 DIAGNOSIS — Z510 Encounter for antineoplastic radiation therapy: Secondary | ICD-10-CM | POA: Diagnosis not present

## 2020-01-19 ENCOUNTER — Ambulatory Visit: Admission: RE | Admit: 2020-01-19 | Payer: Medicare HMO | Source: Ambulatory Visit

## 2020-01-19 DIAGNOSIS — Z510 Encounter for antineoplastic radiation therapy: Secondary | ICD-10-CM | POA: Diagnosis not present

## 2020-01-19 DIAGNOSIS — C50511 Malignant neoplasm of lower-outer quadrant of right female breast: Secondary | ICD-10-CM | POA: Diagnosis not present

## 2020-01-20 ENCOUNTER — Ambulatory Visit
Admission: RE | Admit: 2020-01-20 | Discharge: 2020-01-20 | Disposition: A | Discharge status: Home / Self Care | Payer: Medicare HMO | Source: Ambulatory Visit | Attending: Radiation Oncology | Admitting: Radiation Oncology

## 2020-01-20 DIAGNOSIS — Z510 Encounter for antineoplastic radiation therapy: Secondary | ICD-10-CM | POA: Diagnosis not present

## 2020-01-20 DIAGNOSIS — C50511 Malignant neoplasm of lower-outer quadrant of right female breast: Secondary | ICD-10-CM | POA: Diagnosis not present

## 2020-01-21 ENCOUNTER — Ambulatory Visit
Admission: RE | Admit: 2020-01-21 | Discharge: 2020-01-21 | Disposition: A | Discharge status: Home / Self Care | Payer: Medicare HMO | Source: Ambulatory Visit | Attending: Radiation Oncology | Admitting: Radiation Oncology

## 2020-01-21 DIAGNOSIS — C50511 Malignant neoplasm of lower-outer quadrant of right female breast: Secondary | ICD-10-CM | POA: Diagnosis not present

## 2020-01-21 DIAGNOSIS — Z510 Encounter for antineoplastic radiation therapy: Secondary | ICD-10-CM | POA: Diagnosis not present

## 2020-01-24 ENCOUNTER — Ambulatory Visit
Admission: RE | Admit: 2020-01-24 | Discharge: 2020-01-24 | Disposition: A | Discharge status: Home / Self Care | Payer: Medicare HMO | Source: Ambulatory Visit | Attending: Radiation Oncology | Admitting: Radiation Oncology

## 2020-01-24 DIAGNOSIS — C50511 Malignant neoplasm of lower-outer quadrant of right female breast: Secondary | ICD-10-CM | POA: Diagnosis not present

## 2020-01-24 DIAGNOSIS — Z510 Encounter for antineoplastic radiation therapy: Secondary | ICD-10-CM | POA: Diagnosis not present

## 2020-01-25 ENCOUNTER — Ambulatory Visit
Admission: RE | Admit: 2020-01-25 | Discharge: 2020-01-25 | Disposition: A | Discharge status: Home / Self Care | Payer: Medicare HMO | Source: Ambulatory Visit | Attending: Radiation Oncology | Admitting: Radiation Oncology

## 2020-01-25 DIAGNOSIS — Z510 Encounter for antineoplastic radiation therapy: Secondary | ICD-10-CM | POA: Diagnosis not present

## 2020-01-25 DIAGNOSIS — C50511 Malignant neoplasm of lower-outer quadrant of right female breast: Secondary | ICD-10-CM | POA: Diagnosis not present

## 2020-01-26 ENCOUNTER — Ambulatory Visit
Admission: RE | Admit: 2020-01-26 | Discharge: 2020-01-26 | Disposition: A | Discharge status: Home / Self Care | Payer: Medicare HMO | Source: Ambulatory Visit | Attending: Radiation Oncology | Admitting: Radiation Oncology

## 2020-01-26 DIAGNOSIS — C50511 Malignant neoplasm of lower-outer quadrant of right female breast: Secondary | ICD-10-CM | POA: Diagnosis not present

## 2020-01-26 DIAGNOSIS — Z510 Encounter for antineoplastic radiation therapy: Secondary | ICD-10-CM | POA: Diagnosis not present

## 2020-01-27 ENCOUNTER — Ambulatory Visit
Admission: RE | Admit: 2020-01-27 | Discharge: 2020-01-27 | Disposition: A | Discharge status: Home / Self Care | Payer: Medicare HMO | Source: Ambulatory Visit | Attending: Radiation Oncology | Admitting: Radiation Oncology

## 2020-01-27 DIAGNOSIS — Z510 Encounter for antineoplastic radiation therapy: Secondary | ICD-10-CM | POA: Diagnosis not present

## 2020-01-27 DIAGNOSIS — C50511 Malignant neoplasm of lower-outer quadrant of right female breast: Secondary | ICD-10-CM | POA: Diagnosis not present

## 2020-01-28 ENCOUNTER — Other Ambulatory Visit: Payer: Self-pay

## 2020-01-28 ENCOUNTER — Ambulatory Visit
Admission: RE | Admit: 2020-01-28 | Discharge: 2020-01-28 | Disposition: A | Discharge status: Home / Self Care | Payer: Medicare HMO | Source: Ambulatory Visit | Attending: Radiation Oncology | Admitting: Radiation Oncology

## 2020-01-28 ENCOUNTER — Inpatient Hospital Stay: Payer: Medicare HMO | Attending: Oncology

## 2020-01-28 DIAGNOSIS — C50511 Malignant neoplasm of lower-outer quadrant of right female breast: Secondary | ICD-10-CM | POA: Diagnosis not present

## 2020-01-28 DIAGNOSIS — Z170 Estrogen receptor positive status [ER+]: Secondary | ICD-10-CM | POA: Insufficient documentation

## 2020-01-28 DIAGNOSIS — Z510 Encounter for antineoplastic radiation therapy: Secondary | ICD-10-CM | POA: Diagnosis not present

## 2020-01-28 LAB — CBC
HCT: 37.2 % (ref 36.0–46.0)
Hemoglobin: 12.2 g/dL (ref 12.0–15.0)
MCH: 28.5 pg (ref 26.0–34.0)
MCHC: 32.8 g/dL (ref 30.0–36.0)
MCV: 86.9 fL (ref 80.0–100.0)
Platelets: 387 10*3/uL (ref 150–400)
RBC: 4.28 MIL/uL (ref 3.87–5.11)
RDW: 15.6 % — ABNORMAL HIGH (ref 11.5–15.5)
WBC: 8.9 10*3/uL (ref 4.0–10.5)
nRBC: 0 % (ref 0.0–0.2)

## 2020-01-31 ENCOUNTER — Ambulatory Visit
Admission: RE | Admit: 2020-01-31 | Discharge: 2020-01-31 | Disposition: A | Discharge status: Home / Self Care | Payer: Medicare HMO | Source: Ambulatory Visit | Attending: Radiation Oncology | Admitting: Radiation Oncology

## 2020-01-31 DIAGNOSIS — C50511 Malignant neoplasm of lower-outer quadrant of right female breast: Secondary | ICD-10-CM | POA: Diagnosis not present

## 2020-01-31 DIAGNOSIS — Z510 Encounter for antineoplastic radiation therapy: Secondary | ICD-10-CM | POA: Diagnosis not present

## 2020-02-01 ENCOUNTER — Ambulatory Visit
Admission: RE | Admit: 2020-02-01 | Discharge: 2020-02-01 | Disposition: A | Discharge status: Home / Self Care | Payer: Medicare HMO | Source: Ambulatory Visit | Attending: Radiation Oncology | Admitting: Radiation Oncology

## 2020-02-01 DIAGNOSIS — C50511 Malignant neoplasm of lower-outer quadrant of right female breast: Secondary | ICD-10-CM | POA: Diagnosis not present

## 2020-02-01 DIAGNOSIS — Z510 Encounter for antineoplastic radiation therapy: Secondary | ICD-10-CM | POA: Diagnosis not present

## 2020-02-02 ENCOUNTER — Ambulatory Visit
Admission: RE | Admit: 2020-02-02 | Discharge: 2020-02-02 | Disposition: A | Discharge status: Home / Self Care | Payer: Medicare HMO | Source: Ambulatory Visit | Attending: Radiation Oncology | Admitting: Radiation Oncology

## 2020-02-02 DIAGNOSIS — C50511 Malignant neoplasm of lower-outer quadrant of right female breast: Secondary | ICD-10-CM | POA: Diagnosis not present

## 2020-02-02 DIAGNOSIS — Z510 Encounter for antineoplastic radiation therapy: Secondary | ICD-10-CM | POA: Diagnosis not present

## 2020-02-03 ENCOUNTER — Ambulatory Visit
Admission: RE | Admit: 2020-02-03 | Discharge: 2020-02-03 | Disposition: A | Discharge status: Home / Self Care | Payer: Medicare HMO | Source: Ambulatory Visit | Attending: Radiation Oncology | Admitting: Radiation Oncology

## 2020-02-03 DIAGNOSIS — C50511 Malignant neoplasm of lower-outer quadrant of right female breast: Secondary | ICD-10-CM | POA: Diagnosis not present

## 2020-02-03 DIAGNOSIS — Z510 Encounter for antineoplastic radiation therapy: Secondary | ICD-10-CM | POA: Diagnosis not present

## 2020-02-04 ENCOUNTER — Ambulatory Visit
Admission: RE | Admit: 2020-02-04 | Discharge: 2020-02-04 | Disposition: A | Discharge status: Home / Self Care | Payer: Medicare HMO | Source: Ambulatory Visit | Attending: Radiation Oncology | Admitting: Radiation Oncology

## 2020-02-04 DIAGNOSIS — Z510 Encounter for antineoplastic radiation therapy: Secondary | ICD-10-CM | POA: Diagnosis not present

## 2020-02-04 DIAGNOSIS — C50511 Malignant neoplasm of lower-outer quadrant of right female breast: Secondary | ICD-10-CM | POA: Diagnosis not present

## 2020-02-07 ENCOUNTER — Ambulatory Visit
Admission: RE | Admit: 2020-02-07 | Discharge: 2020-02-07 | Disposition: A | Discharge status: Home / Self Care | Payer: Medicare HMO | Source: Ambulatory Visit | Attending: Radiation Oncology | Admitting: Radiation Oncology

## 2020-02-07 DIAGNOSIS — Z510 Encounter for antineoplastic radiation therapy: Secondary | ICD-10-CM | POA: Diagnosis not present

## 2020-02-07 DIAGNOSIS — C50511 Malignant neoplasm of lower-outer quadrant of right female breast: Secondary | ICD-10-CM | POA: Diagnosis not present

## 2020-02-08 ENCOUNTER — Ambulatory Visit
Admission: RE | Admit: 2020-02-08 | Discharge: 2020-02-08 | Disposition: A | Discharge status: Home / Self Care | Payer: Medicare HMO | Source: Ambulatory Visit | Attending: Radiation Oncology | Admitting: Radiation Oncology

## 2020-02-08 DIAGNOSIS — Z510 Encounter for antineoplastic radiation therapy: Secondary | ICD-10-CM | POA: Diagnosis not present

## 2020-02-08 DIAGNOSIS — C50511 Malignant neoplasm of lower-outer quadrant of right female breast: Secondary | ICD-10-CM | POA: Diagnosis not present

## 2020-02-09 ENCOUNTER — Ambulatory Visit
Admission: RE | Admit: 2020-02-09 | Discharge: 2020-02-09 | Disposition: A | Discharge status: Home / Self Care | Payer: Medicare HMO | Source: Ambulatory Visit | Attending: Radiation Oncology | Admitting: Radiation Oncology

## 2020-02-09 DIAGNOSIS — C50511 Malignant neoplasm of lower-outer quadrant of right female breast: Secondary | ICD-10-CM | POA: Diagnosis not present

## 2020-02-09 DIAGNOSIS — Z510 Encounter for antineoplastic radiation therapy: Secondary | ICD-10-CM | POA: Diagnosis not present

## 2020-02-10 ENCOUNTER — Ambulatory Visit
Admission: RE | Admit: 2020-02-10 | Discharge: 2020-02-10 | Disposition: A | Discharge status: Home / Self Care | Payer: Medicare HMO | Source: Ambulatory Visit | Attending: Radiation Oncology | Admitting: Radiation Oncology

## 2020-02-10 DIAGNOSIS — Z510 Encounter for antineoplastic radiation therapy: Secondary | ICD-10-CM | POA: Diagnosis present

## 2020-02-10 DIAGNOSIS — C50511 Malignant neoplasm of lower-outer quadrant of right female breast: Secondary | ICD-10-CM | POA: Diagnosis not present

## 2020-02-10 DIAGNOSIS — Z170 Estrogen receptor positive status [ER+]: Secondary | ICD-10-CM | POA: Diagnosis not present

## 2020-02-11 ENCOUNTER — Other Ambulatory Visit: Payer: Self-pay

## 2020-02-11 ENCOUNTER — Ambulatory Visit
Admission: RE | Admit: 2020-02-11 | Discharge: 2020-02-11 | Disposition: A | Discharge status: Home / Self Care | Payer: Medicare HMO | Source: Ambulatory Visit | Attending: Radiation Oncology | Admitting: Radiation Oncology

## 2020-02-11 ENCOUNTER — Inpatient Hospital Stay: Payer: Medicare HMO | Attending: Oncology

## 2020-02-11 DIAGNOSIS — Z79811 Long term (current) use of aromatase inhibitors: Secondary | ICD-10-CM | POA: Insufficient documentation

## 2020-02-11 DIAGNOSIS — I2510 Atherosclerotic heart disease of native coronary artery without angina pectoris: Secondary | ICD-10-CM | POA: Insufficient documentation

## 2020-02-11 DIAGNOSIS — I110 Hypertensive heart disease with heart failure: Secondary | ICD-10-CM | POA: Diagnosis not present

## 2020-02-11 DIAGNOSIS — Z510 Encounter for antineoplastic radiation therapy: Secondary | ICD-10-CM | POA: Diagnosis not present

## 2020-02-11 DIAGNOSIS — Z923 Personal history of irradiation: Secondary | ICD-10-CM | POA: Diagnosis not present

## 2020-02-11 DIAGNOSIS — Z170 Estrogen receptor positive status [ER+]: Secondary | ICD-10-CM | POA: Diagnosis not present

## 2020-02-11 DIAGNOSIS — Z79899 Other long term (current) drug therapy: Secondary | ICD-10-CM | POA: Diagnosis not present

## 2020-02-11 DIAGNOSIS — E119 Type 2 diabetes mellitus without complications: Secondary | ICD-10-CM | POA: Insufficient documentation

## 2020-02-11 DIAGNOSIS — C50511 Malignant neoplasm of lower-outer quadrant of right female breast: Secondary | ICD-10-CM

## 2020-02-11 DIAGNOSIS — I509 Heart failure, unspecified: Secondary | ICD-10-CM | POA: Insufficient documentation

## 2020-02-11 LAB — CBC
HCT: 36.8 % (ref 36.0–46.0)
Hemoglobin: 12.4 g/dL (ref 12.0–15.0)
MCH: 29 pg (ref 26.0–34.0)
MCHC: 33.7 g/dL (ref 30.0–36.0)
MCV: 86.2 fL (ref 80.0–100.0)
Platelets: 367 10*3/uL (ref 150–400)
RBC: 4.27 MIL/uL (ref 3.87–5.11)
RDW: 15.3 % (ref 11.5–15.5)
WBC: 8.7 10*3/uL (ref 4.0–10.5)
nRBC: 0 % (ref 0.0–0.2)

## 2020-02-15 ENCOUNTER — Ambulatory Visit
Admission: RE | Admit: 2020-02-15 | Discharge: 2020-02-15 | Disposition: A | Discharge status: Home / Self Care | Payer: Medicare HMO | Source: Ambulatory Visit | Attending: Radiation Oncology | Admitting: Radiation Oncology

## 2020-02-15 DIAGNOSIS — Z510 Encounter for antineoplastic radiation therapy: Secondary | ICD-10-CM | POA: Diagnosis not present

## 2020-02-16 ENCOUNTER — Ambulatory Visit
Admission: RE | Admit: 2020-02-16 | Discharge: 2020-02-16 | Disposition: A | Discharge status: Home / Self Care | Payer: Medicare HMO | Source: Ambulatory Visit | Attending: Radiation Oncology | Admitting: Radiation Oncology

## 2020-02-16 DIAGNOSIS — Z510 Encounter for antineoplastic radiation therapy: Secondary | ICD-10-CM | POA: Diagnosis not present

## 2020-02-17 ENCOUNTER — Ambulatory Visit
Admission: RE | Admit: 2020-02-17 | Discharge: 2020-02-17 | Disposition: A | Discharge status: Home / Self Care | Payer: Medicare HMO | Source: Ambulatory Visit | Attending: Radiation Oncology | Admitting: Radiation Oncology

## 2020-02-17 DIAGNOSIS — Z510 Encounter for antineoplastic radiation therapy: Secondary | ICD-10-CM | POA: Diagnosis not present

## 2020-02-17 DIAGNOSIS — C50511 Malignant neoplasm of lower-outer quadrant of right female breast: Secondary | ICD-10-CM | POA: Diagnosis not present

## 2020-02-18 ENCOUNTER — Ambulatory Visit
Admission: RE | Admit: 2020-02-18 | Discharge: 2020-02-18 | Disposition: A | Discharge status: Home / Self Care | Payer: Medicare HMO | Source: Ambulatory Visit | Attending: Radiation Oncology | Admitting: Radiation Oncology

## 2020-02-18 DIAGNOSIS — Z510 Encounter for antineoplastic radiation therapy: Secondary | ICD-10-CM | POA: Diagnosis not present

## 2020-02-21 ENCOUNTER — Other Ambulatory Visit: Payer: Self-pay | Admitting: Internal Medicine

## 2020-02-21 ENCOUNTER — Other Ambulatory Visit: Payer: Self-pay | Admitting: Cardiovascular Disease

## 2020-02-21 DIAGNOSIS — E7800 Pure hypercholesterolemia, unspecified: Secondary | ICD-10-CM

## 2020-03-04 NOTE — Progress Notes (Signed)
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  INTERVAL HISTORY: Patient returns to clinic today for further evaluation and initiation of aromatase inhibitor.  She recently finished adjuvant XRT and tolerated her treatments well.  She currently feels well and is asymptomatic. She has no neurologic complaints.  She denies any recent fevers or illnesses.  She has a good appetite and denies weight loss. She has no chest pain, shortness of breath, cough, or hemoptysis.  She denies any nausea, vomiting, constipation, or diarrhea.  She has no melena or hematochezia.  She has no urinary complaints.  Patient offers no specific complaints today.  REVIEW OF SYSTEMS:   Review of Systems  Constitutional: Negative.  Negative for fever, malaise/fatigue and weight loss.  Respiratory: Negative.  Negative for cough and shortness of breath.   Cardiovascular: Negative.  Negative for chest pain and leg swelling.  Gastrointestinal: Negative.  Negative for abdominal pain, blood in stool and melena.  Genitourinary: Negative.  Negative for hematuria.  Musculoskeletal: Negative.  Negative for back pain.  Skin: Negative.  Negative for rash.  Neurological: Negative.  Negative for dizziness, focal weakness, weakness and headaches.  Psychiatric/Behavioral: Negative.  The patient is not nervous/anxious.     As per HPI. Otherwise, a complete review of systems is negative.  PAST MEDICAL HISTORY: Past Medical History:  Diagnosis Date  . Allergy   . CAD (coronary artery disease)   . Chicken pox   . Diabetes mellitus ([place])   .  Heart murmur   . HFrEF (heart failure with reduced ejection fraction) ([place])   . Hypertension     PAST SURGICAL HISTORY: Past Surgical History:  Procedure Laterality Date  . BREAST BIOPSY Right 11/04/2019   Affirm Bx- path pending- Ribbon Clip  . BREAST BIOPSY Left 11/04/2019   Affirm Bx- path pending- Coil Clip  . BREAST BIOPSY Left 11/12/2019   [place] bx/ ribbon clip/ path pending  . BREAST CYST ASPIRATION Left 11/12/2019   2 areas  . BREAST LUMPECTOMY Right 12/10/2019   Procedure: BREAST LUMPECTOMY;  Surgeon: Ronny Bacon, MD;  Location: [facility] ORS;  Service: General;  Laterality: Right;  . CORONARY STENT INTERVENTION N/A 08/09/2019   Procedure: CORONARY STENT INTERVENTION;  Surgeon: Nelva Bush, MD;  Location: [facility];  Service: Cardiovascular;  Laterality: N/A;  . LEFT HEART CATH AND CORONARY ANGIOGRAPHY N/A 08/09/2019   Procedure: LEFT HEART CATH AND CORONARY ANGIOGRAPHY;  Surgeon: Nelva Bush, MD;  Location: [facility];  Service: Cardiovascular;  Laterality: N/A;    FAMILY HISTORY: Family History  Problem Relation Age of Onset  . Heart failure Mother   . Diabetes Mother   . Hypertension Mother   . Congestive Heart Failure Brother   . Hypertension Brother   . Cerebral palsy Son   . Congestive Heart Failure Brother   . Breast cancer Sister 50  . Thyroid cancer Sister     ADVANCED DIRECTIVES (Y/N):  N  HEALTH MAINTENANCE: Social History   Tobacco Use  . Smoking status: Never Smoker  . Smokeless tobacco: Never Used  Vaping Use  . Vaping Use: Never used  Substance Use  Topics  . Alcohol use: No    Alcohol/week: 0.0 standard drinks  . Drug use: No     Colonoscopy:  PAP:  Bone density:  Lipid panel:  No Known Allergies  Current Outpatient Medications  Medication Sig Dispense Refill  . acetaminophen (TYLENOL) 500 MG tablet Take 500-1,000 mg by mouth every 6 (six) hours as needed (for pain.).    Marland Kitchen amLODipine (NORVASC) 5 MG  tablet Take 1 tablet (5 mg total) by mouth daily. 90 tablet 3  . BRILINTA 90 MG TABS tablet TAKE 1 TABLET (90 MG TOTAL) BY MOUTH 2 (TWO) TIMES DAILY. 180 tablet 0  . calcium carbonate (OS-CAL - DOSED IN MG OF ELEMENTAL CALCIUM) 1250 (500 Ca) MG tablet Take 500 mg by mouth daily.     . carvedilol (COREG) 25 MG tablet Take 1 tablet (25 mg total) by mouth 2 (two) times daily. 180 tablet 3  . fenofibrate 54 MG tablet TAKE 1 TABLET BY MOUTH EVERY DAY (Patient taking differently: Take 54 mg by mouth daily. ) 90 tablet 0  . ferrous sulfate 325 (65 FE) MG tablet TAKE 1 TABLET BY MOUTH EVERY DAY 90 tablet 0  . HYDROcodone-acetaminophen (NORCO/VICODIN) 5-325 MG tablet Take 1 tablet by mouth every 6 (six) hours as needed for moderate pain. 15 tablet 0  . ibuprofen (ADVIL) 800 MG tablet Take 1 tablet (800 mg total) by mouth every 8 (eight) hours as needed. 30 tablet 0  . Omega-3 Fatty Acids (FISH OIL) 500 MG CAPS Take 500 mg by mouth daily.    . potassium chloride SA (KLOR-CON M20) 20 MEQ tablet Take 1 tablet (20 mEq total) by mouth daily. 90 tablet 3  . rosuvastatin (CRESTOR) 10 MG tablet Take 1 tablet (10 mg total) by mouth daily. 90 tablet 0  . spironolactone (ALDACTONE) 25 MG tablet TAKE 1 TABLET BY MOUTH EVERY DAY (Patient taking differently: Take 25 mg by mouth daily. ) 30 tablet 5  . letrozole (FEMARA) 2.5 MG tablet Take 1 tablet (2.5 mg total) by mouth daily. 30 tablet 3  . losartan (COZAAR) 25 MG tablet Take 1 tablet (25 mg total) by mouth daily. 30 tablet 5   No current facility-administered medications for this visit.    OBJECTIVE: Vitals:   03/07/20 1106  BP: 101/67  Pulse: 77  Resp: 20  Temp: (!) 97 F (36.1 C)     Body mass index is 24.5 kg/m.    ECOG FS:0 - Asymptomatic  General: Well-developed, well-nourished, no acute distress. Eyes: Pink conjunctiva, anicteric sclera. HEENT: Normocephalic, moist mucous membranes. Breast: Exam deferred today. Lungs: No audible wheezing or  coughing. Heart: Regular rate and rhythm. Abdomen: Soft, nontender, no obvious distention. Musculoskeletal: No edema, cyanosis, or clubbing. Neuro: Alert, answering all questions appropriately. Cranial nerves grossly intact. Skin: No rashes or petechiae noted. Psych: Normal affect.   LAB RESULTS:  Lab Results  Component Value Date   NA 138 12/08/2019   K 4.3 12/08/2019   CL 107 12/08/2019   CO2 24 12/08/2019   GLUCOSE 145 (H) 12/08/2019   BUN 20 12/08/2019   CREATININE 1.08 (H) 12/08/2019   CALCIUM 9.1 12/08/2019   PROT 7.9 12/08/2019   ALBUMIN 4.5 12/08/2019   AST 22 12/08/2019   ALT 14 12/08/2019   ALKPHOS 49 12/08/2019   BILITOT 0.5 12/08/2019   GFRNONAA 49 (L) 12/08/2019   GFRAA 57 (L) 12/08/2019    Lab Results  Component Value Date   WBC 8.7 02/11/2020   NEUTROABS  5.5 12/08/2019   HGB 12.4 02/11/2020   HCT 36.8 02/11/2020   MCV 86.2 02/11/2020   PLT 367 02/11/2020   Lab Results  Component Value Date   IRON 124 10/29/2019   TIBC 515 (H) 10/29/2019   IRONPCTSAT 24 10/29/2019   Lab Results  Component Value Date   FERRITIN 21 10/29/2019     STUDIES: No results found.  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  Given the fact that she is grade 1 and mucinous, Oncotype DX was not necessary since patient likely will not require chemotherapy.  Patient completed adjuvant XRT in July 2021.  She is given a prescription for letrozole today and will take 5 years of treatment completing in July 2026.  We will get baseline bone mineral density in the next 1 to 2 weeks.  Return to clinic in 3 months for routine evaluation.  2.  Iron deficiency anemia: Patient's most  recent hemoglobin was within normal limits at 12.3.  No intervention is needed at this time.  She last received IV Venofer on November 22, 2019.  Return to clinic as above.    I spent a total of 30 minutes reviewing chart data, face-to-face evaluation with the patient, counseling and coordination of care as detailed above.    Patient expressed understanding and was in agreement with this plan. She also understands that She can call clinic at any time with any questions, concerns, or complaints.   Cancer Staging Carcinoma of lower-outer quadrant of female breast, right [facility]) Staging form: Breast, AJCC 8th Edition - Clinical stage from 01/07/2020: Stage IB (cT2, cN0, cM0, G1, ER+, PR+, HER2-) - Signed by Lloyd Huger, MD on 01/07/2020   Lloyd Huger, MD   03/09/2020 5:54 AM

## 2020-03-07 ENCOUNTER — Encounter: Payer: Self-pay | Admitting: Oncology

## 2020-03-07 ENCOUNTER — Other Ambulatory Visit: Payer: Self-pay

## 2020-03-07 ENCOUNTER — Inpatient Hospital Stay (HOSPITAL_BASED_OUTPATIENT_CLINIC_OR_DEPARTMENT_OTHER): Payer: Medicare HMO | Admitting: Oncology

## 2020-03-07 VITALS — BP 101/67 | HR 77 | Temp 97.0°F | Resp 20 | Wt 138.3 lb

## 2020-03-07 DIAGNOSIS — I110 Hypertensive heart disease with heart failure: Secondary | ICD-10-CM | POA: Diagnosis not present

## 2020-03-07 DIAGNOSIS — Z170 Estrogen receptor positive status [ER+]: Secondary | ICD-10-CM | POA: Diagnosis not present

## 2020-03-07 DIAGNOSIS — I509 Heart failure, unspecified: Secondary | ICD-10-CM | POA: Diagnosis not present

## 2020-03-07 DIAGNOSIS — I2510 Atherosclerotic heart disease of native coronary artery without angina pectoris: Secondary | ICD-10-CM | POA: Diagnosis not present

## 2020-03-07 DIAGNOSIS — C50511 Malignant neoplasm of lower-outer quadrant of right female breast: Secondary | ICD-10-CM

## 2020-03-07 DIAGNOSIS — E119 Type 2 diabetes mellitus without complications: Secondary | ICD-10-CM | POA: Diagnosis not present

## 2020-03-07 DIAGNOSIS — Z79899 Other long term (current) drug therapy: Secondary | ICD-10-CM | POA: Diagnosis not present

## 2020-03-07 DIAGNOSIS — Z923 Personal history of irradiation: Secondary | ICD-10-CM | POA: Diagnosis not present

## 2020-03-07 DIAGNOSIS — Z79811 Long term (current) use of aromatase inhibitors: Secondary | ICD-10-CM | POA: Diagnosis not present

## 2020-03-07 MED ORDER — LETROZOLE 2.5 MG PO TABS
2.5000 mg | ORAL_TABLET | Freq: Every day | ORAL | 3 refills | Status: DC
Start: 2020-03-07 — End: 2020-05-29

## 2020-03-07 NOTE — Progress Notes (Signed)
Patient here today for follow up regarding breast cancer. Patient reports episodes of dizziness when standing, has follow up with PCP this week.

## 2020-03-09 ENCOUNTER — Encounter: Payer: Self-pay | Admitting: Internal Medicine

## 2020-03-09 ENCOUNTER — Ambulatory Visit (INDEPENDENT_AMBULATORY_CARE_PROVIDER_SITE_OTHER): Payer: Medicare HMO | Admitting: Internal Medicine

## 2020-03-09 ENCOUNTER — Other Ambulatory Visit: Payer: Self-pay

## 2020-03-09 VITALS — BP 106/72 | HR 85 | Temp 97.5°F | Ht 63.0 in | Wt 138.0 lb

## 2020-03-09 DIAGNOSIS — I214 Non-ST elevation (NSTEMI) myocardial infarction: Secondary | ICD-10-CM | POA: Diagnosis not present

## 2020-03-09 DIAGNOSIS — I5022 Chronic systolic (congestive) heart failure: Secondary | ICD-10-CM

## 2020-03-09 DIAGNOSIS — I255 Ischemic cardiomyopathy: Secondary | ICD-10-CM

## 2020-03-09 DIAGNOSIS — D509 Iron deficiency anemia, unspecified: Secondary | ICD-10-CM

## 2020-03-09 DIAGNOSIS — I10 Essential (primary) hypertension: Secondary | ICD-10-CM | POA: Diagnosis not present

## 2020-03-09 DIAGNOSIS — E119 Type 2 diabetes mellitus without complications: Secondary | ICD-10-CM | POA: Diagnosis not present

## 2020-03-09 DIAGNOSIS — C50511 Malignant neoplasm of lower-outer quadrant of right female breast: Secondary | ICD-10-CM

## 2020-03-09 DIAGNOSIS — E7800 Pure hypercholesterolemia, unspecified: Secondary | ICD-10-CM

## 2020-03-09 DIAGNOSIS — Z0000 Encounter for general adult medical examination without abnormal findings: Secondary | ICD-10-CM | POA: Diagnosis not present

## 2020-03-09 LAB — COMPREHENSIVE METABOLIC PANEL
ALT: 43 U/L — ABNORMAL HIGH (ref 0–35)
AST: 33 U/L (ref 0–37)
Albumin: 4.5 g/dL (ref 3.5–5.2)
Alkaline Phosphatase: 63 U/L (ref 39–117)
BUN: 24 mg/dL — ABNORMAL HIGH (ref 6–23)
CO2: 26 mEq/L (ref 19–32)
Calcium: 9.8 mg/dL (ref 8.4–10.5)
Chloride: 104 mEq/L (ref 96–112)
Creatinine, Ser: 0.97 mg/dL (ref 0.40–1.20)
GFR: 55.35 mL/min — ABNORMAL LOW (ref >60.00)
Glucose, Bld: 139 mg/dL — ABNORMAL HIGH (ref 70–99)
Potassium: 4.6 mEq/L (ref 3.5–5.1)
Sodium: 135 mEq/L (ref 135–145)
Total Bilirubin: 0.7 mg/dL (ref 0.2–1.2)
Total Protein: 7.7 g/dL (ref 6.0–8.3)

## 2020-03-09 LAB — HEMOGLOBIN A1C: Hgb A1c MFr Bld: 6.6 % — ABNORMAL HIGH (ref 4.6–6.5)

## 2020-03-09 LAB — LIPID PANEL
Cholesterol: 112 mg/dL (ref 0–200)
HDL: 47.4 mg/dL (ref >39.00)
LDL Cholesterol: 34 mg/dL (ref 0–99)
NonHDL: 64.97
Total CHOL/HDL Ratio: 2
Triglycerides: 157 mg/dL — ABNORMAL HIGH (ref 0.0–149.0)
VLDL: 31.4 mg/dL (ref 0.0–40.0)

## 2020-03-09 NOTE — Assessment & Plan Note (Signed)
Controlled on Carvedilol, Amlodipine, Losartan, Spironolactone and Potassium C met today We will monitor

## 2020-03-09 NOTE — Assessment & Plan Note (Signed)
C met and lipid profile today Encouraged her to consume a low-fat diet Continue Rosuvastatin, Fenofibrate and Brilinta

## 2020-03-09 NOTE — Assessment & Plan Note (Signed)
CBC, IBC panel, B12 reviewed She will continue oral iron She will continue to follow with hematology

## 2020-03-09 NOTE — Progress Notes (Signed)
HPI:  Patient presents to the clinic today for her subsequent annual Medicare wellness exam.  She is also due to follow-up chronic conditions.  HLD with CAD status post MI: She denies angina.  Her last LDL was 45, 10/2019.  She denies myalgias on Rosuvastatin.  She is taking Fenofibrate, Brilinta as well.  She does have stents in place.  She follows with cardiology.  DM2: Her last A1c was 5.4%, 10/2019.  Her sugars range 97-130.  She is not taking any oral diabetic medication at this time.  She checks her feet routinely.  Her last eye exam was more than 2 years ago.  HTN: Her BP today is 106/72.  She is taking Amlodipine, Losartan, Carvedilol and Spironolactone as prescribed.  ECG from 12/2019 reviewed.  CHF, Systolic: Compensated.  Managed on Carvedilol, Amlodipine, Losartan, Spironolactone and Potassium.  Echo from 07/2019 reviewed.  She follows with cardiology.  Breast Cancer, Right: s/p lumpectomy and radiation. She is taking Femara as prescribed. She is following with oncology.  Iron Deficiency Anemia: Her last H/H was 12.1/36.8, 02/2020. She is taking an oral iron supplement OTC. She has been getting iron infusions. She follows with hematology.  Past Medical History:  Diagnosis Date  . Allergy   . CAD (coronary artery disease)   . Chicken pox   . Diabetes mellitus ([place])   . Heart murmur   . HFrEF (heart failure with reduced ejection fraction) ([place])   . Hypertension     Current Outpatient Medications  Medication Sig Dispense Refill  . acetaminophen (TYLENOL) 500 MG tablet Take 500-1,000 mg by mouth every 6 (six) hours as needed (for pain.).    Marland Kitchen amLODipine (NORVASC) 5 MG tablet Take 1 tablet (5 mg total) by mouth daily. 90 tablet 3  . BRILINTA 90 MG TABS tablet TAKE 1 TABLET (90 MG TOTAL) BY MOUTH 2 (TWO) TIMES DAILY. 180 tablet 0  . calcium carbonate (OS-CAL - DOSED IN MG OF ELEMENTAL CALCIUM) 1250 (500 Ca) MG tablet Take 500 mg by mouth daily.     . carvedilol (COREG) 25 MG tablet  Take 1 tablet (25 mg total) by mouth 2 (two) times daily. 180 tablet 3  . fenofibrate 54 MG tablet TAKE 1 TABLET BY MOUTH EVERY DAY (Patient taking differently: Take 54 mg by mouth daily. ) 90 tablet 0  . ferrous sulfate 325 (65 FE) MG tablet TAKE 1 TABLET BY MOUTH EVERY DAY 90 tablet 0  . HYDROcodone-acetaminophen (NORCO/VICODIN) 5-325 MG tablet Take 1 tablet by mouth every 6 (six) hours as needed for moderate pain. 15 tablet 0  . ibuprofen (ADVIL) 800 MG tablet Take 1 tablet (800 mg total) by mouth every 8 (eight) hours as needed. 30 tablet 0  . letrozole (FEMARA) 2.5 MG tablet Take 1 tablet (2.5 mg total) by mouth daily. 30 tablet 3  . losartan (COZAAR) 25 MG tablet Take 1 tablet (25 mg total) by mouth daily. 30 tablet 5  . Omega-3 Fatty Acids (FISH OIL) 500 MG CAPS Take 500 mg by mouth daily.    . potassium chloride SA (KLOR-CON M20) 20 MEQ tablet Take 1 tablet (20 mEq total) by mouth daily. 90 tablet 3  . rosuvastatin (CRESTOR) 10 MG tablet Take 1 tablet (10 mg total) by mouth daily. 90 tablet 0  . spironolactone (ALDACTONE) 25 MG tablet TAKE 1 TABLET BY MOUTH EVERY DAY (Patient taking differently: Take 25 mg by mouth daily. ) 30 tablet 5   No current facility-administered medications for this visit.  No Known Allergies  Family History  Problem Relation Age of Onset  . Heart failure Mother   . Diabetes Mother   . Hypertension Mother   . Congestive Heart Failure Brother   . Hypertension Brother   . Cerebral palsy Son   . Congestive Heart Failure Brother   . Breast cancer Sister 44  . Thyroid cancer Sister     Social History   Socioeconomic History  . Marital status: Widowed    Spouse name: Not on file  . Number of children: Not on file  . Years of education: Not on file  . Highest education level: Not on file  Occupational History  . Not on file  Tobacco Use  . Smoking status: Never Smoker  . Smokeless tobacco: Never Used  Vaping Use  . Vaping Use: Never used   Substance and Sexual Activity  . Alcohol use: No    Alcohol/week: 0.0 standard drinks  . Drug use: No  . Sexual activity: Never  Other Topics Concern  . Not on file  Social History Narrative  . Not on file   Social Determinants of Health   Financial Resource Strain:   . Difficulty of Paying Living Expenses:   Food Insecurity:   . Worried About Charity fundraiser in the Last Year:   . Arboriculturist in the Last Year:   Transportation Needs:   . Film/video editor (Medical):   Marland Kitchen Lack of Transportation (Non-Medical):   Physical Activity:   . Days of Exercise per Week:   . Minutes of Exercise per Session:   Stress:   . Feeling of Stress :   Social Connections:   . Frequency of Communication with Friends and Family:   . Frequency of Social Gatherings with Friends and Family:   . Attends Religious Services:   . Active Member of Clubs or Organizations:   . Attends Archivist Meetings:   Marland Kitchen Marital Status:   Intimate Partner Violence:   . Fear of Current or Ex-Partner:   . Emotionally Abused:   Marland Kitchen Physically Abused:   . Sexually Abused:     Hospitiliaztions: 11/2019, breast lumpectomy  Health Maintenance:    Flu: 05/2019  Tetanus: never  Pneumovax: never  Prevnar: never  Zostavax: never  Shingrix: never  Covid: Pfizer  Mammogram: 11/2019  Pap Smear: more than 5 years ago  Bone Density: never, scheduled  Colon Screening: never  Eye Doctor: as needed  Dental Exam: dentures   Providers  PCP: Webb Silversmith, NP:  Oncologist: Dr. Grayland Ormond  Cardiologist: Dr. Fletcher Anon  Hematology: Dr. Grayland Ormond     I have personally reviewed and have noted:  1. The patient's medical and social history 2. Their use of alcohol, tobacco or illicit drugs 3. Their current medications and supplements 4. The patient's functional ability including ADL's, fall risks, home safety risks and hearing or visual impairment. 5. Diet and physical activities 6. Evidence for depression  or mood disorder  Subjective:   Review of Systems:   Constitutional: Denies fever, malaise, fatigue, headache or abrupt weight changes.  HEENT: Denies eye pain, eye redness, ear pain, ringing in the ears, wax buildup, runny nose, nasal congestion, bloody nose, or sore throat. Respiratory: Denies difficulty breathing, shortness of breath, cough or sputum production.   Cardiovascular: Denies chest pain, chest tightness, palpitations or swelling in the hands or feet.  Gastrointestinal: Denies abdominal pain, bloating, constipation, diarrhea or blood in the stool.  GU: Denies urgency, frequency,  pain with urination, burning sensation, blood in urine, odor or discharge. Musculoskeletal: Denies decrease in range of motion, difficulty with gait, muscle pain or joint pain and swelling.  Skin: Denies redness, rashes, lesions or ulcercations.  Neurological: Denies dizziness, difficulty with memory, difficulty with speech or problems with balance and coordination.  Psych: Denies anxiety, depression, SI/HI.  No other specific complaints in a complete review of systems (except as listed in HPI above).  Objective:  PE:   BP 106/72   Pulse 85   Temp (!) 97.5 F (36.4 C) (Temporal)   Ht '5\' 3"'  (1.6 m)   Wt 138 lb (62.6 kg)   SpO2 98%   BMI 24.45 kg/m   Wt Readings from Last 3 Encounters:  03/07/20 138 lb 4.8 oz (62.7 kg)  01/07/20 139 lb 12.8 oz (63.4 kg)  01/06/20 140 lb 6 oz (63.7 kg)    General: Appears her stated age, well developed, well nourished in NAD. Skin: Warm, dry and intact. No ulcerations noted. HEENT: Head: normal shape and size; Eyes: sclera white, no icterus, conjunctiva pink, PERRLA and EOMs intact;  Neck: Neck supple, trachea midline. No masses, lumps or thyromegaly present.  Cardiovascular: Normal rate and rhythm. S1,S2 noted.  No murmur, rubs or gallops noted. No JVD or BLE edema. No carotid bruits noted. Pulmonary/Chest: Normal effort and positive vesicular breath  sounds. No respiratory distress. No wheezes, rales or ronchi noted.  Abdomen: Soft and nontender. Normal bowel sounds. No distention or masses noted. Liver, spleen and kidneys non palpable. Musculoskeletal: Strength 5/5 BUE/BLE. No signs of joint swelling.  Neurological: Alert and oriented. Cranial nerves II-XII grossly intact. Coordination normal.  Psychiatric: Mood and affect normal. Behavior is normal. Judgment and thought content normal.     BMET    Component Value Date/Time   NA 138 12/08/2019 1113   NA 140 08/15/2015 0803   K 4.3 12/08/2019 1113   CL 107 12/08/2019 1113   CO2 24 12/08/2019 1113   GLUCOSE 145 (H) 12/08/2019 1113   BUN 20 12/08/2019 1113   BUN 11 08/15/2015 0803   CREATININE 1.08 (H) 12/08/2019 1113   CALCIUM 9.1 12/08/2019 1113   GFRNONAA 49 (L) 12/08/2019 1113   GFRAA 57 (L) 12/08/2019 1113    Lipid Panel     Component Value Date/Time   CHOL 118 10/14/2019 1058   CHOL 274 (H) 08/15/2015 0803   TRIG 153.0 (H) 10/14/2019 1058   HDL 42.60 10/14/2019 1058   HDL 38 (L) 08/15/2015 0803   CHOLHDL 3 10/14/2019 1058   VLDL 30.6 10/14/2019 1058   LDLCALC 45 10/14/2019 1058   LDLCALC 184 (H) 08/15/2015 0803    CBC    Component Value Date/Time   WBC 8.7 02/11/2020 1117   RBC 4.27 02/11/2020 1117   HGB 12.4 02/11/2020 1117   HCT 36.8 02/11/2020 1117   PLT 367 02/11/2020 1117   MCV 86.2 02/11/2020 1117   MCH 29.0 02/11/2020 1117   MCHC 33.7 02/11/2020 1117   RDW 15.3 02/11/2020 1117   LYMPHSABS 2.5 12/08/2019 1113   MONOABS 0.6 12/08/2019 1113   EOSABS 0.2 12/08/2019 1113   BASOSABS 0.0 12/08/2019 1113    Hgb A1C Lab Results  Component Value Date   HGBA1C 5.4 10/14/2019      Assessment and Plan:   Medicare Annual Wellness Visit:  Diet: She does eat meat. She consumes fruits and veggies daily. She tries to avoid fried foods. She drinks mostly water, juice (half with water) Physical  activity: Exercise bike 30 minutes daily. Depression/mood  screen: Negative, PHQ 9 score of 1 Hearing: Intact to whispered voice Visual acuity: Grossly normal, performs annual eye exam  ADLs: Capable Fall risk: None Home safety: Good Cognitive evaluation: Intact to orientation, naming, recall and repetition EOL planning: Adv directives, full code/ I agree  Preventative Medicine: Encouraged her to get a flu shot in the fall.  She declines tetanus, Pneumovax, Prevnar, Zostavax and Shingrix at this time.  Covid vaccine UTD.  She declines cervical cancer screening at this time.  Mammogram UTD.  Bone density ordered and scheduled.  She declines referral for colonoscopy, Cologuard or Ifob at this time.  Encouraged her to consume a balanced diet and exercise regimen.  Advised her to see an eye doctor and dentist annually.  Will check C met, lipid, A1c today.  Due date for screening exams given to patient as part of her AVS.   Next appointment: 6 months, follow-up chronic conditions  Webb Silversmith, NP This visit occurred during the SARS-CoV-2 public health emergency.  Safety protocols were in place, including screening questions prior to the visit, additional usage of staff PPE, and extensive cleaning of exam room while observing appropriate contact time as indicated for disinfecting solutions.

## 2020-03-09 NOTE — Assessment & Plan Note (Signed)
Continue Femara She will continue to follow with oncology

## 2020-03-09 NOTE — Assessment & Plan Note (Signed)
Status post stent Continue Rosuvastatin, Fenofibrate, Brilinta, Carvedilol, Amlodipine, Losartan, Spironolactone and Potassium C met today She will continue to follow with cardiology

## 2020-03-09 NOTE — Assessment & Plan Note (Signed)
Managed with Carvedilol, Amlodipine, Losartan She will continue to follow with cardiology

## 2020-03-09 NOTE — Assessment & Plan Note (Signed)
A1c today No urine microalbumin secondary to ARB therapy Encouraged her to consume a low carb diet Encourage routine eye exams, offered referral for ophthalmology but she declines at this time Foot exam today She declines flu and Pneumovax.  Covid UTD

## 2020-03-09 NOTE — Assessment & Plan Note (Signed)
Compensated Continue Carvedilol, Amlodipine, Losartan, Spironolactone and Potassium Reinforced DASH diet Encourage daily weights

## 2020-03-09 NOTE — Patient Instructions (Signed)

## 2020-03-21 ENCOUNTER — Ambulatory Visit (INDEPENDENT_AMBULATORY_CARE_PROVIDER_SITE_OTHER): Payer: Medicare HMO | Admitting: Surgery

## 2020-03-21 ENCOUNTER — Encounter: Payer: Self-pay | Admitting: Surgery

## 2020-03-21 ENCOUNTER — Other Ambulatory Visit: Payer: Self-pay

## 2020-03-21 VITALS — BP 130/76 | HR 86 | Temp 98.8°F | Ht 63.0 in | Wt 138.0 lb

## 2020-03-21 DIAGNOSIS — C50511 Malignant neoplasm of lower-outer quadrant of right female breast: Secondary | ICD-10-CM | POA: Diagnosis not present

## 2020-03-21 DIAGNOSIS — Z170 Estrogen receptor positive status [ER+]: Secondary | ICD-10-CM | POA: Diagnosis not present

## 2020-03-21 NOTE — Progress Notes (Signed)
Surgical Clinic Progress/Follow-up Note   HPI:  79 y.o. Female presents to clinic for right breast cancer, 3 months follow-up.  She is completed her radiation therapy, and is tolerating antihormonal therapy well.  Patient reports  improvement/resolution of prior issues.  Review of Systems:  Constitutional: denies fever/chills  ENT: denies sore throat, hearing problems  Respiratory: denies shortness of breath, wheezing  Cardiovascular: denies chest pain, palpitations  Gastrointestinal: denies abdominal pain, N/V, or diarrhea/and bowel function as per interval history Skin: Denies any other rashes or skin discolorations   Vital Signs:  BP 130/76   Pulse 86   Temp 98.8 F (37.1 C)   Ht 5\' 3"  (1.6 m)   Wt 138 lb (62.6 kg)   SpO2 96%   BMI 24.45 kg/m    Physical Exam:  Constitutional:  -- Normal body habitus  -- Awake, alert, and oriented x3  Pulmonary:  -- No crackles -- Equal breath sounds bilaterally -- Breathing non-labored at rest Cardiovascular:  -- S1, S2 present  -- No pericardial rubs  Gastrointestinal:  -- Soft and non-distended, non-tender Breast -- Post-surgical incisions all well-approximated on the lateral right breast, without any peri-incisional/breast erythema or dermal changes.  MS / Integumentary:  -- Wounds or skin discoloration: None appreciated -- Extremities: B/L UE and LE FROM, hands and feet warm, no edema   Laboratory studies:none Imaging: No new pertinent imaging available for review   Assessment:  79 y.o. yo Female with a problem list including...  Patient Active Problem List   Diagnosis Date Noted  . Carcinoma of lower-outer quadrant of female breast, right ([place]) 11/06/2019  . Iron deficiency anemia 10/22/2019  . Type 2 diabetes mellitus without complication, without long-term current use of insulin ([place]) 10/14/2019  . CHF (congestive heart failure), NYHA class II, chronic, systolic ([place]) 70/78/6754  . Ischemic cardiomyopathy   . NSTEMI  (non-ST elevated myocardial infarction) ([place]) 08/09/2019  . HLD (hyperlipidemia) 02/28/2017  . Environmental allergies 08/29/2015  . Essential hypertension 07/15/2014    presents to clinic for follow-up evaluation of right breast conservation for right breast cancer, progressing well.  Plan:              - return to clinic after next round of imaging, or as needed, instructed to call office if any questions or concerns  All of the above recommendations were discussed with the patient, and all of patient's questions were answered to her expressed satisfaction.  Ronny Bacon, MD, FACS [place]: [facility] exceptional care. Office: [phone]

## 2020-03-21 NOTE — Patient Instructions (Signed)
We will have Dr Baruch Gouty decide about when to do your next mammogram.   We will have you follow up in 3 months with a phone call.

## 2020-03-22 ENCOUNTER — Ambulatory Visit
Admission: RE | Admit: 2020-03-22 | Discharge: 2020-03-22 | Disposition: A | Discharge status: Home / Self Care | Payer: Medicare HMO | Source: Ambulatory Visit | Attending: Radiation Oncology | Admitting: Radiation Oncology

## 2020-03-22 ENCOUNTER — Other Ambulatory Visit: Payer: Self-pay

## 2020-03-22 ENCOUNTER — Encounter: Payer: Self-pay | Admitting: Radiation Oncology

## 2020-03-22 VITALS — BP 109/61 | HR 78 | Temp 95.9°F | Resp 16 | Wt 138.1 lb

## 2020-03-22 DIAGNOSIS — Z170 Estrogen receptor positive status [ER+]: Secondary | ICD-10-CM | POA: Insufficient documentation

## 2020-03-22 DIAGNOSIS — Z79811 Long term (current) use of aromatase inhibitors: Secondary | ICD-10-CM | POA: Diagnosis not present

## 2020-03-22 DIAGNOSIS — C50511 Malignant neoplasm of lower-outer quadrant of right female breast: Secondary | ICD-10-CM | POA: Diagnosis not present

## 2020-03-22 DIAGNOSIS — Z923 Personal history of irradiation: Secondary | ICD-10-CM | POA: Insufficient documentation

## 2020-03-22 NOTE — Progress Notes (Signed)
Radiation Oncology Follow up Note  Name: Janice Liu   Date:   03/22/2020 MRN:  [id number] DOB: [birth year]    This 79 y.o. female presents to the clinic today for 1 month follow-up status post whole breast radiation to her right breast.  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  Based on her risk of approximately 10% sentinel node involvement did not treat her peripheral lymphatics.  She is seen today in routine follow-up is doing well.  She specifically denies breast tenderness cough or bone pain..  She is currently on letrozole tolerating it well without side effect.  Current  COMPLICATIONS OF TREATMENT: none  FOLLOW UP COMPLIANCE: keeps appointments   PHYSICAL EXAM:  BP 109/61 (BP Location: Left Arm)   Pulse 78   Temp (!) 95.9 F (35.5 C) (Tympanic)   Resp 16   Wt 138 lb 1.6 oz (62.6 kg)   BMI 24.46 kg/m  Lungs are clear to A&P cardiac examination essentially unremarkable with regular rate and rhythm. No dominant mass or nodularity is noted in either breast in 2 positions examined. Incision is well-healed. No axillary or supraclavicular adenopathy is appreciated. Cosmetic result is excellent.  Well-developed well-nourished patient in NAD. HEENT reveals PERLA, EOMI, discs not visualized.  Oral cavity is clear. No oral mucosal lesions are identified. Neck is clear without evidence of cervical or supraclavicular adenopathy. Lungs are clear to A&P. Cardiac examination is essentially unremarkable with regular rate and rhythm without murmur rub or thrill. Abdomen is benign with no organomegaly or masses noted. Motor sensory and DTR levels are equal and symmetric in  the upper and lower extremities. Cranial nerves II through XII are grossly intact. Proprioception is intact. No peripheral adenopathy or edema is identified. No motor or sensory levels are noted. Crude visual fields are within normal range.  RADIOLOGY RESULTS: No current films to review  PLAN: Present time patient is doing well 1 month out from whole breast radiation and pleased with her overall progress.  Of asked to see her back in 1 4 to 5 months for follow-up.  She continues on letrozole without side effect patient knows to call with any concerns.  I would like to take this opportunity to thank you for allowing me to participate in the care of your patient.Noreene Filbert, MD

## 2020-03-23 ENCOUNTER — Other Ambulatory Visit: Payer: Self-pay

## 2020-03-23 ENCOUNTER — Ambulatory Visit
Admission: RE | Admit: 2020-03-23 | Discharge: 2020-03-23 | Disposition: A | Discharge status: Home / Self Care | Payer: Medicare HMO | Source: Ambulatory Visit | Attending: Oncology | Admitting: Oncology

## 2020-03-23 DIAGNOSIS — Z13820 Encounter for screening for osteoporosis: Secondary | ICD-10-CM | POA: Diagnosis not present

## 2020-03-23 DIAGNOSIS — Z780 Asymptomatic menopausal state: Secondary | ICD-10-CM | POA: Insufficient documentation

## 2020-03-23 DIAGNOSIS — M810 Age-related osteoporosis without current pathological fracture: Secondary | ICD-10-CM | POA: Insufficient documentation

## 2020-03-23 DIAGNOSIS — E119 Type 2 diabetes mellitus without complications: Secondary | ICD-10-CM | POA: Insufficient documentation

## 2020-03-23 DIAGNOSIS — R29890 Loss of height: Secondary | ICD-10-CM | POA: Diagnosis not present

## 2020-03-23 DIAGNOSIS — C50511 Malignant neoplasm of lower-outer quadrant of right female breast: Secondary | ICD-10-CM

## 2020-03-24 ENCOUNTER — Other Ambulatory Visit: Payer: Self-pay

## 2020-03-24 ENCOUNTER — Other Ambulatory Visit: Payer: Self-pay | Admitting: Internal Medicine

## 2020-03-24 MED ORDER — ALENDRONATE SODIUM 70 MG PO TABS: 70.0000 mg | ORAL_TABLET | ORAL | 3 refills | Status: DC

## 2020-03-24 NOTE — Telephone Encounter (Signed)
No, dose is 70mg  once per week.

## 2020-03-24 NOTE — Addendum Note (Signed)
Addended by: Ruthell Rummage A on: 03/24/2020 01:34 PM   Modules accepted: Orders

## 2020-03-31 ENCOUNTER — Other Ambulatory Visit: Payer: Self-pay | Admitting: Cardiovascular Disease

## 2020-04-02 ENCOUNTER — Other Ambulatory Visit: Payer: Self-pay | Admitting: Internal Medicine

## 2020-04-08 ENCOUNTER — Other Ambulatory Visit: Payer: Self-pay | Admitting: Cardiovascular Disease

## 2020-04-17 ENCOUNTER — Other Ambulatory Visit: Payer: Self-pay | Admitting: Internal Medicine

## 2020-05-28 ENCOUNTER — Other Ambulatory Visit: Payer: Self-pay | Admitting: Internal Medicine

## 2020-05-28 DIAGNOSIS — E7800 Pure hypercholesterolemia, unspecified: Secondary | ICD-10-CM

## 2020-05-29 ENCOUNTER — Other Ambulatory Visit: Payer: Self-pay | Admitting: Oncology

## 2020-05-31 ENCOUNTER — Other Ambulatory Visit: Payer: Self-pay | Admitting: Internal Medicine

## 2020-06-02 NOTE — Progress Notes (Signed)
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  INTERVAL HISTORY: Patient returns to clinic today for further evaluation and to assess her toleration of letrozole and Fosamax.  She continues to feel well and remains asymptomatic.  She is tolerating her treatment without significant side effects.  She has no neurologic complaints.  She denies any recent fevers or illnesses.  She has a good appetite and denies weight loss. She has no chest pain, shortness of breath, cough, or hemoptysis.  She denies any nausea, vomiting, constipation, or diarrhea.  She has no melena or hematochezia.  She has no urinary complaints.  Patient feels at her baseline offers no specific complaints today.  REVIEW OF SYSTEMS:   Review of Systems  Constitutional: Negative.  Negative for fever, malaise/fatigue and weight loss.  Respiratory: Negative.  Negative for cough and shortness of breath.   Cardiovascular: Negative.  Negative for chest pain and leg swelling.  Gastrointestinal: Negative.  Negative for abdominal pain, blood in stool and melena.  Genitourinary: Negative.  Negative for hematuria.  Musculoskeletal: Negative.  Negative for back pain.  Skin: Negative.  Negative for rash.  Neurological: Negative.  Negative for dizziness, focal weakness, weakness and headaches.  Psychiatric/Behavioral: Negative.  The patient is not nervous/anxious.     As per HPI. Otherwise, a complete review of systems is negative.  PAST MEDICAL HISTORY: Past Medical History:  Diagnosis Date  . Allergy   . CAD (coronary artery disease)   .  Chicken pox   . Diabetes mellitus ([place])   . Heart murmur   . HFrEF (heart failure with reduced ejection fraction) ([place])   . Hypertension     PAST SURGICAL HISTORY: Past Surgical History:  Procedure Laterality Date  . BREAST BIOPSY Right 11/04/2019   Affirm Bx- path pending- Ribbon Clip  . BREAST BIOPSY Left 11/04/2019   Affirm Bx- path pending- Coil Clip  . BREAST BIOPSY Left 11/12/2019   [place] bx/ ribbon clip/ path pending  . BREAST CYST ASPIRATION Left 11/12/2019   2 areas  . BREAST LUMPECTOMY Right 12/10/2019   Procedure: BREAST LUMPECTOMY;  Surgeon: Ronny Bacon, MD;  Location: [facility] ORS;  Service: General;  Laterality: Right;  . CORONARY STENT INTERVENTION N/A 08/09/2019   Procedure: CORONARY STENT INTERVENTION;  Surgeon: Nelva Bush, MD;  Location: [place] [facility];  Service: Cardiovascular;  Laterality: N/A;  . LEFT HEART CATH AND CORONARY ANGIOGRAPHY N/A 08/09/2019   Procedure: LEFT HEART CATH AND CORONARY ANGIOGRAPHY;  Surgeon: Nelva Bush, MD;  Location: [place] [facility];  Service: Cardiovascular;  Laterality: N/A;    FAMILY HISTORY: Family History  Problem Relation Age of Onset  . Heart failure Mother   . Diabetes Mother   . Hypertension Mother   . Congestive Heart Failure Brother   . Hypertension Brother   . Cerebral palsy Son   . Congestive Heart Failure Brother   . Breast cancer Sister 51  . Thyroid cancer Sister     ADVANCED DIRECTIVES (Y/N):  N  HEALTH MAINTENANCE: Social History   Tobacco Use  . Smoking status: Never Smoker  . Smokeless tobacco: Never Used  Vaping Use  .  Vaping Use: Never used  Substance Use Topics  . Alcohol use: No    Alcohol/week: 0.0 standard drinks  . Drug use: No     Colonoscopy:  PAP:  Bone density:  Lipid panel:  No Known Allergies  Current Outpatient Medications  Medication Sig Dispense Refill  . acetaminophen (TYLENOL) 500 MG tablet Take 500-1,000 mg by mouth every 6 (six) hours as needed  (for pain.).    Marland Kitchen alendronate (FOSAMAX) 70 MG tablet Take 1 tablet (70 mg total) by mouth once a week. Take with a full glass of water on an empty stomach. 4 tablet 3  . amLODipine (NORVASC) 5 MG tablet TAKE 1 TABLET BY MOUTH EVERY DAY 90 tablet 0  . BRILINTA 90 MG TABS tablet TAKE 1 TABLET (90 MG TOTAL) BY MOUTH 2 (TWO) TIMES DAILY. 180 tablet 0  . calcium carbonate (OS-CAL - DOSED IN MG OF ELEMENTAL CALCIUM) 1250 (500 Ca) MG tablet Take 500 mg by mouth daily.     . carvedilol (COREG) 25 MG tablet TAKE 1 TABLET BY MOUTH TWICE A DAY 180 tablet 0  . fenofibrate 54 MG tablet TAKE 1 TABLET BY MOUTH EVERY DAY 90 tablet 0  . ferrous sulfate 325 (65 FE) MG tablet TAKE 1 TABLET BY MOUTH EVERY DAY 90 tablet 0  . ibuprofen (ADVIL) 800 MG tablet Take 1 tablet (800 mg total) by mouth every 8 (eight) hours as needed. 30 tablet 0  . letrozole (FEMARA) 2.5 MG tablet TAKE 1 TABLET BY MOUTH EVERY DAY 90 tablet 1  . losartan (COZAAR) 25 MG tablet TAKE 1 TABLET BY MOUTH EVERY DAY 90 tablet 3  . Omega-3 Fatty Acids (FISH OIL) 500 MG CAPS Take 500 mg by mouth daily.    . potassium chloride SA (KLOR-CON M20) 20 MEQ tablet Take 1 tablet (20 mEq total) by mouth daily. 90 tablet 3  . rosuvastatin (CRESTOR) 10 MG tablet TAKE 1 TABLET BY MOUTH EVERY DAY 30 tablet 0  . spironolactone (ALDACTONE) 25 MG tablet TAKE 1 TABLET BY MOUTH EVERY DAY 90 tablet 0   No current facility-administered medications for this visit.    OBJECTIVE: Vitals:   06/08/20 1046  BP: 121/62  Pulse: 91  Resp: 20  Temp: 98 F (36.7 C)  SpO2: 99%     Body mass index is 24.89 kg/m.    ECOG FS:0 - Asymptomatic  General: Well-developed, well-nourished, no acute distress. Eyes: Pink conjunctiva, anicteric sclera. HEENT: Normocephalic, moist mucous membranes. Lungs: No audible wheezing or coughing. Heart: Regular rate and rhythm. Abdomen: Soft, nontender, no obvious distention. Musculoskeletal: No edema, cyanosis, or clubbing. Neuro: Alert,  answering all questions appropriately. Cranial nerves grossly intact. Skin: No rashes or petechiae noted. Psych: Normal affect.   LAB RESULTS:  Lab Results  Component Value Date   NA 135 03/09/2020   K 4.6 03/09/2020   CL 104 03/09/2020   CO2 26 03/09/2020   GLUCOSE 139 (H) 03/09/2020   BUN 24 (H) 03/09/2020   CREATININE 0.97 03/09/2020   CALCIUM 9.8 03/09/2020   PROT 7.7 03/09/2020   ALBUMIN 4.5 03/09/2020   AST 33 03/09/2020   ALT 43 (H) 03/09/2020   ALKPHOS 63 03/09/2020   BILITOT 0.7 03/09/2020   GFRNONAA 49 (L) 12/08/2019   GFRAA 57 (L) 12/08/2019    Lab Results  Component Value Date   WBC 8.7 02/11/2020   NEUTROABS 5.5 12/08/2019   HGB 12.4 02/11/2020   HCT 36.8 02/11/2020   MCV 86.2 02/11/2020  PLT 367 02/11/2020   Lab Results  Component Value Date   IRON 124 10/29/2019   TIBC 515 (H) 10/29/2019   IRONPCTSAT 24 10/29/2019   Lab Results  Component Value Date   FERRITIN 21 10/29/2019     STUDIES: No results found.  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  Given the fact that she is grade 1 and mucinous, Oncotype DX was not necessary.  Patient completed adjuvant XRT in July 2021.  Continue letrozole for a total of 5 years completing treatment in July 2026.  Return to clinic in 6 months for routine evaluation. 2.  Osteoporosis: Bone mineral density completed on March 23, 2020 revealed a T score of -3.1.  Patient was initiated on Fosamax and instructed to continue calcium and vitamin D supplementation.  Repeat bone mineral density in August 2022.  If there is no significant improvement, will consider discontinuing letrozole and initiating tamoxifen. 3.   Iron deficiency anemia: Patient's most recent hemoglobin was within normal limits at 12.3.  No intervention is needed at this time.  She last received IV Venofer on November 22, 2019.  Return to clinic as above.     Patient expressed understanding and was in agreement with this plan. She also understands that She can call clinic at any time with any questions, concerns, or complaints.   Cancer Staging Carcinoma of lower-outer quadrant of female breast, right [facility]) Staging form: Breast, AJCC 8th Edition - Clinical stage from 01/07/2020: Stage IB (cT2, cN0, cM0, G1, ER+, PR+, HER2-) - Signed by Lloyd Huger, MD on 01/07/2020   Lloyd Huger, MD   06/09/2020 11:43 AM

## 2020-06-04 ENCOUNTER — Other Ambulatory Visit: Payer: Self-pay | Admitting: Cardiovascular Disease

## 2020-06-07 DIAGNOSIS — R69 Illness, unspecified: Secondary | ICD-10-CM | POA: Diagnosis not present

## 2020-06-08 ENCOUNTER — Other Ambulatory Visit: Payer: Self-pay

## 2020-06-08 ENCOUNTER — Encounter: Payer: Self-pay | Admitting: Oncology

## 2020-06-08 ENCOUNTER — Inpatient Hospital Stay: Payer: Medicare HMO | Attending: Oncology | Admitting: Oncology

## 2020-06-08 VITALS — BP 121/62 | HR 91 | Temp 98.0°F | Resp 20 | Wt 140.5 lb

## 2020-06-08 DIAGNOSIS — I110 Hypertensive heart disease with heart failure: Secondary | ICD-10-CM | POA: Diagnosis not present

## 2020-06-08 DIAGNOSIS — I2510 Atherosclerotic heart disease of native coronary artery without angina pectoris: Secondary | ICD-10-CM | POA: Diagnosis not present

## 2020-06-08 DIAGNOSIS — Z79899 Other long term (current) drug therapy: Secondary | ICD-10-CM | POA: Diagnosis not present

## 2020-06-08 DIAGNOSIS — I509 Heart failure, unspecified: Secondary | ICD-10-CM | POA: Insufficient documentation

## 2020-06-08 DIAGNOSIS — Z170 Estrogen receptor positive status [ER+]: Secondary | ICD-10-CM | POA: Insufficient documentation

## 2020-06-08 DIAGNOSIS — Z791 Long term (current) use of non-steroidal anti-inflammatories (NSAID): Secondary | ICD-10-CM | POA: Diagnosis not present

## 2020-06-08 DIAGNOSIS — C50511 Malignant neoplasm of lower-outer quadrant of right female breast: Secondary | ICD-10-CM

## 2020-06-08 DIAGNOSIS — M810 Age-related osteoporosis without current pathological fracture: Secondary | ICD-10-CM | POA: Insufficient documentation

## 2020-06-08 DIAGNOSIS — Z79811 Long term (current) use of aromatase inhibitors: Secondary | ICD-10-CM | POA: Insufficient documentation

## 2020-06-08 DIAGNOSIS — E119 Type 2 diabetes mellitus without complications: Secondary | ICD-10-CM | POA: Diagnosis not present

## 2020-06-10 ENCOUNTER — Ambulatory Visit: Payer: Medicare HMO | Attending: Internal Medicine

## 2020-06-10 DIAGNOSIS — Z23 Encounter for immunization: Secondary | ICD-10-CM

## 2020-06-10 NOTE — Progress Notes (Signed)
   Covid-19 Vaccination Clinic  Name:  Janice Liu    MRN: [id number] DOB: [birth year]  06/10/2020  [name] was observed post Covid-19 immunization for 15 minutes without incident. She was provided with Vaccine Information Sheet and instruction to access the V-Safe system.   [name] was instructed to call 911 with any severe reactions post vaccine: Marland Kitchen Difficulty breathing  . Swelling of face and throat  . A fast heartbeat  . A bad rash all over body  . Dizziness and weakness

## 2020-06-15 ENCOUNTER — Other Ambulatory Visit: Payer: Self-pay | Admitting: Oncology

## 2020-06-20 ENCOUNTER — Other Ambulatory Visit: Payer: Self-pay

## 2020-06-20 ENCOUNTER — Telehealth (INDEPENDENT_AMBULATORY_CARE_PROVIDER_SITE_OTHER): Payer: Medicare HMO | Admitting: Surgery

## 2020-06-20 DIAGNOSIS — C50511 Malignant neoplasm of lower-outer quadrant of right female breast: Secondary | ICD-10-CM

## 2020-06-20 NOTE — Progress Notes (Signed)
I was able to reach Janice Liu at home today, and confirmed that she has done well over the last few months following her partial mastectomy.  She denies any pain, any skin changes or any new issues involving her breast.  She reports she is taking her medications as prescribed and denies any issues or intolerance of these. She reports she is getting regular exercise on the stationary bike but a half an hour a day. She is anticipating follow-up imaging in the spring, and following up with her oncologist as well at that time. She reports she is also vaccinated, along with the flu vaccine as well.  And has been staying healthy. Advised to be able to help her in any way should anything change in the near future. Not anticipating any changes in her care pending future imaging.

## 2020-06-21 DIAGNOSIS — Z20822 Contact with and (suspected) exposure to covid-19: Secondary | ICD-10-CM | POA: Diagnosis not present

## 2020-06-24 ENCOUNTER — Other Ambulatory Visit: Payer: Self-pay | Admitting: Internal Medicine

## 2020-06-24 DIAGNOSIS — E7800 Pure hypercholesterolemia, unspecified: Secondary | ICD-10-CM

## 2020-06-25 ENCOUNTER — Other Ambulatory Visit: Payer: Self-pay | Admitting: Internal Medicine

## 2020-06-28 ENCOUNTER — Other Ambulatory Visit: Payer: Self-pay | Admitting: Internal Medicine

## 2020-06-28 DIAGNOSIS — E119 Type 2 diabetes mellitus without complications: Secondary | ICD-10-CM

## 2020-07-03 DIAGNOSIS — Z20822 Contact with and (suspected) exposure to covid-19: Secondary | ICD-10-CM | POA: Diagnosis not present

## 2020-07-10 ENCOUNTER — Other Ambulatory Visit: Payer: Self-pay | Admitting: Cardiovascular Disease

## 2020-07-11 ENCOUNTER — Encounter: Payer: Self-pay | Admitting: Cardiovascular Disease

## 2020-07-11 ENCOUNTER — Ambulatory Visit: Payer: Medicare HMO | Admitting: Cardiovascular Disease

## 2020-07-11 VITALS — BP 120/60 | HR 77 | Wt 137.0 lb

## 2020-07-11 DIAGNOSIS — I10 Essential (primary) hypertension: Secondary | ICD-10-CM

## 2020-07-11 DIAGNOSIS — I2510 Atherosclerotic heart disease of native coronary artery without angina pectoris: Secondary | ICD-10-CM | POA: Diagnosis not present

## 2020-07-11 DIAGNOSIS — E785 Hyperlipidemia, unspecified: Secondary | ICD-10-CM

## 2020-07-11 DIAGNOSIS — I5022 Chronic systolic (congestive) heart failure: Secondary | ICD-10-CM | POA: Diagnosis not present

## 2020-07-11 MED ORDER — ASPIRIN EC 81 MG PO TBEC: 81.0000 mg | DELAYED_RELEASE_TABLET | Freq: Every day | ORAL | Status: AC

## 2020-07-11 NOTE — Patient Instructions (Signed)
Medication Instructions:  Your physician has recommended you make the following change in your medication:   1) STOP Potassium 2) Complete your Brilinta supply and then START Aspirin 81 mg daily.   *If you need a refill on your cardiac medications before your next appointment, please call your pharmacy*   Lab Work: None ordered  If you have labs (blood work) drawn today and your tests are completely normal, you will receive your results only by: Marland Kitchen MyChart Message (if you have MyChart) OR . A paper copy in the mail If you have any lab test that is abnormal or we need to change your treatment, we will call you to review the results.   Testing/Procedures: None ordered   Follow-Up: At [facility], you and your health needs are our priority.  As part of our continuing mission to provide you with exceptional heart care, we have created designated Provider Care Teams.  These Care Teams include your primary Cardiologist (physician) and Advanced Practice Providers (APPs -  Physician Assistants and Nurse Practitioners) who all work together to provide you with the care you need, when you need it.  We recommend signing up for the patient portal called "MyChart".  Sign up information is provided on this After Visit Summary.  MyChart is used to connect with patients for Virtual Visits (Telemedicine).  Patients are able to view lab/test results, encounter notes, upcoming appointments, etc.  Non-urgent messages can be sent to your provider as well.   To learn more about what you can do with MyChart, go to [email or web address].    Your next appointment:   6 month(s)  The format for your next appointment:   In Person  Provider:   You may see Kathlyn Sacramento, MD or one of the following Advanced Practice Providers on your designated Care Team:    Murray Hodgkins, NP  Christell Faith, PA-C  Marrianne Mood, PA-C  Cadence Elkin, [place]  Laurann Montana, NP    Other Instructions N/A

## 2020-07-11 NOTE — Progress Notes (Signed)
Cardiology Office Note   Date:  07/11/2020   ID:  Janice Liu, DOB [birth year], MRN [id number]  PCP:  Jearld Fenton, NP  Cardiologist:   Kathlyn Sacramento, MD   Chief Complaint  Patient presents with  . office visit    6 month F/U; Meds verbally reviewed with patient.      History of Present Illness: Janice Liu is a 79 y.o. female who presents for a follow-up visit regarding coronary artery disease and chronic systolic heart failure.  She has chronic medical conditions that include essential hypertension, hyperlipidemia and type 2 diabetes. She presented in December 2020 with non-ST elevation myocardial infarction.  Peak troponin was greater than 27,000.  Cardiac catheterization showed severe one-vessel coronary artery disease with 90% ulcerative stenosis in the mid LAD and moderate proximal LAD and distal RCA disease.  Ejection fraction was 40 to 45% with mildly elevated left ventricular end-diastolic pressure.  She underwent successful angioplasty and drug-eluting stent placement to the mid LAD.  She had a follow-up echocardiogram in March, 2021 that showed improvement in ejection fraction to 50%.  She was diagnosed with right breast cancer and underwent right lumpectomy the end of April without complications.  She has been now on ticagrelor without aspirin due to some bleeding issues.    She has been doing well with no recent chest pain, shortness of breath or palpitations.  She has occasional dizziness.  She takes her medications regularly.  Past Medical History:  Diagnosis Date  . Allergy   . CAD (coronary artery disease)   . Chicken pox   . Diabetes mellitus ([place])   . Heart murmur   . HFrEF (heart failure with reduced ejection fraction) ([place])   . Hypertension     Past Surgical History:  Procedure Laterality Date  . BREAST BIOPSY Right 11/04/2019   Affirm Bx- path pending- Ribbon Clip  . BREAST BIOPSY Left 11/04/2019   Affirm Bx- path pending- Coil Clip  . BREAST  BIOPSY Left 11/12/2019   [place] bx/ ribbon clip/ path pending  . BREAST CYST ASPIRATION Left 11/12/2019   2 areas  . BREAST LUMPECTOMY Right 12/10/2019   Procedure: BREAST LUMPECTOMY;  Surgeon: Ronny Bacon, MD;  Location: [facility] ORS;  Service: General;  Laterality: Right;  . CARDIAC CATHETERIZATION    . CORONARY STENT INTERVENTION N/A 08/09/2019   Procedure: CORONARY STENT INTERVENTION;  Surgeon: Nelva Bush, MD;  Location: [place] [facility];  Service: Cardiovascular;  Laterality: N/A;  . LEFT HEART CATH AND CORONARY ANGIOGRAPHY N/A 08/09/2019   Procedure: LEFT HEART CATH AND CORONARY ANGIOGRAPHY;  Surgeon: Nelva Bush, MD;  Location: [place] [facility];  Service: Cardiovascular;  Laterality: N/A;     Current Outpatient Medications  Medication Sig Dispense Refill  . acetaminophen (TYLENOL) 500 MG tablet Take 500-1,000 mg by mouth every 6 (six) hours as needed (for pain.).    Marland Kitchen alendronate (FOSAMAX) 70 MG tablet TAKE 1 TABLET (70 MG TOTAL) BY MOUTH ONCE A WEEK. TAKE WITH A FULL GLASS OF WATER ON AN EMPTY STOMACH. 12 tablet 1  . amLODipine (NORVASC) 5 MG tablet TAKE 1 TABLET BY MOUTH EVERY DAY 90 tablet 0  . BRILINTA 90 MG TABS tablet TAKE 1 TABLET (90 MG TOTAL) BY MOUTH 2 (TWO) TIMES DAILY. 180 tablet 0  . calcium carbonate (OS-CAL - DOSED IN MG OF ELEMENTAL CALCIUM) 1250 (500 Ca) MG tablet Take 500 mg by mouth daily.     . carvedilol (COREG) 25 MG  tablet TAKE 1 TABLET BY MOUTH TWICE A DAY 180 tablet 0  . fenofibrate 54 MG tablet TAKE 1 TABLET BY MOUTH EVERY DAY 90 tablet 0  . ferrous sulfate 325 (65 FE) MG tablet TAKE 1 TABLET BY MOUTH EVERY DAY 90 tablet 0  . ibuprofen (ADVIL) 800 MG tablet Take 1 tablet (800 mg total) by mouth every 8 (eight) hours as needed. 30 tablet 0  . letrozole (FEMARA) 2.5 MG tablet TAKE 1 TABLET BY MOUTH EVERY DAY 90 tablet 1  . losartan (COZAAR) 25 MG tablet TAKE 1 TABLET BY MOUTH EVERY DAY 90 tablet 3  . Omega-3 Fatty Acids (FISH OIL) 500 MG CAPS  Take 500 mg by mouth daily.    . potassium chloride SA (KLOR-CON M20) 20 MEQ tablet Take 1 tablet (20 mEq total) by mouth daily. 90 tablet 3  . rosuvastatin (CRESTOR) 10 MG tablet TAKE 1 TABLET BY MOUTH EVERY DAY 30 tablet 0  . spironolactone (ALDACTONE) 25 MG tablet TAKE 1 TABLET BY MOUTH EVERY DAY 90 tablet 0   No current facility-administered medications for this visit.    Allergies:   Patient has no known allergies.    Social History:  The patient  reports that she has never smoked. She has never used smokeless tobacco. She reports that she does not drink alcohol and does not use drugs.   Family History:  The patient's family history includes Breast cancer (age of onset: 69) in her sister; Cerebral palsy in her son; Congestive Heart Failure in her brother and brother; Diabetes in her mother; Heart failure in her mother; Hypertension in her brother and mother; Thyroid cancer in her sister.    ROS:  Please see the history of present illness.   Otherwise, review of systems are positive for none.   All other systems are reviewed and negative.    PHYSICAL EXAM: VS:  BP 120/60 (BP Location: Left Arm, Patient Position: Sitting, Cuff Size: Normal)   Pulse 77   Wt 137 lb (62.1 kg)   SpO2 97%   BMI 24.27 kg/m  , BMI Body mass index is 24.27 kg/m. GEN: Well nourished, well developed, in no acute distress  HEENT: normal  Neck: no JVD, carotid bruits, or masses Cardiac: RRR; no murmurs, rubs, or gallops,no edema  Respiratory:  clear to auscultation bilaterally, normal work of breathing GI: soft, nontender, nondistended, + BS MS: no deformity or atrophy  Skin: warm and dry, no rash Neuro:  Strength and sensation are intact Psych: euthymic mood, full affect   EKG:  EKG is ordered today. The ekg ordered today demonstrates normal sinus rhythm with no significant ST or T wave changes.  Poor R wave progression in the anterior leads.   Recent Labs: 08/09/2019: Magnesium 2.0; TSH  1.892 02/11/2020: Hemoglobin 12.4; Platelets 367 03/09/2020: ALT 43; BUN 24; Creatinine, Ser 0.97; Potassium 4.6; Sodium 135    Lipid Panel    Component Value Date/Time   CHOL 112 03/09/2020 0949   CHOL 274 (H) 08/15/2015 0803   TRIG 157.0 (H) 03/09/2020 0949   HDL 47.40 03/09/2020 0949   HDL 38 (L) 08/15/2015 0803   CHOLHDL 2 03/09/2020 0949   VLDL 31.4 03/09/2020 0949   LDLCALC 34 03/09/2020 0949   LDLCALC 184 (H) 08/15/2015 0803   LDLDIRECT 87.0 07/01/2019 0916      Wt Readings from Last 3 Encounters:  07/11/20 137 lb (62.1 kg)  06/08/20 140 lb 8 oz (63.7 kg)  03/22/20 138 lb 1.6 oz (  62.6 kg)       ASSESSMENT AND PLAN:  1.  Coronary artery disease involving native coronary arteries without angina: She is doing extremely well at the present time after LAD PCI in December.  It has been almost 1 year since her myocardial infarction and drug-eluting stent placement.  I asked her to finish current supplies of Brilinta then stop and start aspirin 81 mg daily.  2.  Chronic systolic heart failure due to ischemic cardiomyopathy: Ejection fraction was 40 to 45% but improved to 50% on most recent echocardiogram.   Continue treatment with carvedilol, losartan and spironolactone.  She is not on a loop diuretic and appears to be euvolemic.  I discontinued potassium chloride.  3.  Essential hypertension: Blood pressure is well controlled on current medications  4.  Hyperlipidemia: Currently on rosuvastatin 10 mg daily, fenofibrate and fish oil.  Most recent lipid profile showed an LDL of 34 and triglyceride of 157  5.  Breast cancer: Followed by oncology and currently is on Femara.   Disposition:   Follow-up in 6 months.   Signed,  Kathlyn Sacramento, MD  07/11/2020 2:43 PM    [place]

## 2020-07-14 ENCOUNTER — Other Ambulatory Visit: Payer: Self-pay

## 2020-07-14 ENCOUNTER — Other Ambulatory Visit (INDEPENDENT_AMBULATORY_CARE_PROVIDER_SITE_OTHER): Payer: Medicare HMO

## 2020-07-14 DIAGNOSIS — E119 Type 2 diabetes mellitus without complications: Secondary | ICD-10-CM | POA: Diagnosis not present

## 2020-07-14 DIAGNOSIS — N1831 Chronic kidney disease, stage 3a: Secondary | ICD-10-CM

## 2020-07-14 LAB — BASIC METABOLIC PANEL
BUN: 19 mg/dL (ref 6–23)
CO2: 26 mEq/L (ref 19–32)
Calcium: 9.1 mg/dL (ref 8.4–10.5)
Chloride: 106 mEq/L (ref 96–112)
Creatinine, Ser: 1.06 mg/dL (ref 0.40–1.20)
GFR: 49.9 mL/min — ABNORMAL LOW (ref >60.00)
Glucose, Bld: 111 mg/dL — ABNORMAL HIGH (ref 70–99)
Potassium: 4.6 mEq/L (ref 3.5–5.1)
Sodium: 140 mEq/L (ref 135–145)

## 2020-07-14 LAB — HEMOGLOBIN A1C: Hgb A1c MFr Bld: 6.2 % (ref 4.6–6.5)

## 2020-07-15 ENCOUNTER — Other Ambulatory Visit: Payer: Self-pay | Admitting: Internal Medicine

## 2020-07-22 ENCOUNTER — Other Ambulatory Visit: Payer: Self-pay | Admitting: Internal Medicine

## 2020-07-22 DIAGNOSIS — E7800 Pure hypercholesterolemia, unspecified: Secondary | ICD-10-CM

## 2020-07-30 NOTE — Addendum Note (Signed)
Addended by: Jearld Fenton on: 07/30/2020 05:21 PM   Modules accepted: Orders

## 2020-08-24 ENCOUNTER — Other Ambulatory Visit: Payer: Self-pay | Admitting: Internal Medicine

## 2020-08-24 DIAGNOSIS — N1831 Chronic kidney disease, stage 3a: Secondary | ICD-10-CM | POA: Diagnosis not present

## 2020-08-24 DIAGNOSIS — N390 Urinary tract infection, site not specified: Secondary | ICD-10-CM | POA: Diagnosis not present

## 2020-08-24 DIAGNOSIS — I509 Heart failure, unspecified: Secondary | ICD-10-CM | POA: Diagnosis not present

## 2020-08-24 DIAGNOSIS — I129 Hypertensive chronic kidney disease with stage 1 through stage 4 chronic kidney disease, or unspecified chronic kidney disease: Secondary | ICD-10-CM | POA: Diagnosis not present

## 2020-08-24 DIAGNOSIS — D509 Iron deficiency anemia, unspecified: Secondary | ICD-10-CM | POA: Diagnosis not present

## 2020-08-28 ENCOUNTER — Ambulatory Visit: Payer: Medicare HMO | Admitting: Radiation Oncology

## 2020-08-29 ENCOUNTER — Other Ambulatory Visit: Payer: Self-pay | Admitting: Cardiovascular Disease

## 2020-08-31 DIAGNOSIS — N1831 Chronic kidney disease, stage 3a: Secondary | ICD-10-CM | POA: Diagnosis not present

## 2020-08-31 DIAGNOSIS — N281 Cyst of kidney, acquired: Secondary | ICD-10-CM | POA: Diagnosis not present

## 2020-09-12 ENCOUNTER — Ambulatory Visit: Payer: Medicare HMO | Admitting: Internal Medicine

## 2020-09-13 ENCOUNTER — Ambulatory Visit: Payer: Medicare HMO | Admitting: Internal Medicine

## 2020-09-13 NOTE — Progress Notes (Deleted)
Subjective:    Patient ID: Janice Liu, female    DOB: [birth year], 80 y.o.   MRN: [id number]  HPI  Patient presents the clinic today for 64-month follow-up of chronic conditions.  HLD with CAD status post MI: She denies angina.  Her last LDL was 34, 02/2020.  She denies myalgias on Rosuvastatin,  Fenofibrate and Brilinta.  She follows with cardiology.  DM2: Her last A1c was 6.2%, 07/2020.  Her sugars range.  She is not taking any oral diabetic medication at this time.  She checks her feet routinely.  Her last eye exam was.  Flu.  Pneumovax.  Prevnar.  COVID.  HTN: Her BP today is.  She is taking Amlodipine, Losartan, Carvedilol and Spironolactone as prescribed.  ECG from 06/2020 reviewed.  CHF, Systolic: Compensated.  Managed on Carvedilol, Amlodipine, Losartan, Spironolactone and potassium.  Echo from 10/2019 reviewed.  She follows with cardiology.  Breast Cancer, Right: Status post lumpectomy and radiation.  She is taking Femara as prescribed.  She is following with oncology.  Iron Deficiency Anemia: Her last H/H was 12.4/36.8, 02/2020.  She is taking an oral iron supplement OTC.  She has been also getting iron infusions with hematology.  Review of Systems      Past Medical History:  Diagnosis Date  . Allergy   . CAD (coronary artery disease)   . Chicken pox   . Diabetes mellitus ([place])   . Heart murmur   . HFrEF (heart failure with reduced ejection fraction) ([place])   . Hypertension     Current Outpatient Medications  Medication Sig Dispense Refill  . acetaminophen (TYLENOL) 500 MG tablet Take 500-1,000 mg by mouth every 6 (six) hours as needed (for pain.).    Marland Kitchen alendronate (FOSAMAX) 70 MG tablet TAKE 1 TABLET (70 MG TOTAL) BY MOUTH ONCE A WEEK. TAKE WITH A FULL GLASS OF WATER ON AN EMPTY STOMACH. 12 tablet 1  . amLODipine (NORVASC) 5 MG tablet TAKE 1 TABLET BY MOUTH EVERY DAY 90 tablet 0  . aspirin EC 81 MG tablet Take 1 tablet (81 mg total) by mouth daily. Swallow whole.     . calcium carbonate (OS-CAL - DOSED IN MG OF ELEMENTAL CALCIUM) 1250 (500 Ca) MG tablet Take 500 mg by mouth daily.     . carvedilol (COREG) 25 MG tablet TAKE 1 TABLET BY MOUTH TWICE A DAY 180 tablet 0  . fenofibrate 54 MG tablet TAKE 1 TABLET BY MOUTH EVERY DAY 90 tablet 0  . ferrous sulfate 325 (65 FE) MG tablet TAKE 1 TABLET BY MOUTH EVERY DAY 90 tablet 0  . ibuprofen (ADVIL) 800 MG tablet Take 1 tablet (800 mg total) by mouth every 8 (eight) hours as needed. 30 tablet 0  . letrozole (FEMARA) 2.5 MG tablet TAKE 1 TABLET BY MOUTH EVERY DAY 90 tablet 1  . losartan (COZAAR) 25 MG tablet TAKE 1 TABLET BY MOUTH EVERY DAY 90 tablet 3  . Omega-3 Fatty Acids (FISH OIL) 500 MG CAPS Take 500 mg by mouth daily.    . rosuvastatin (CRESTOR) 10 MG tablet TAKE 1 TABLET BY MOUTH EVERY DAY 90 tablet 0  . spironolactone (ALDACTONE) 25 MG tablet TAKE 1 TABLET BY MOUTH EVERY DAY 90 tablet 0   No current facility-administered medications for this visit.    No Known Allergies  Family History  Problem Relation Age of Onset  . Heart failure Mother   . Diabetes Mother   . Hypertension Mother   . Congestive  Heart Failure Brother   . Hypertension Brother   . Cerebral palsy Son   . Congestive Heart Failure Brother   . Breast cancer Sister 33  . Thyroid cancer Sister     Social History   Socioeconomic History  . Marital status: Widowed    Spouse name: Not on file  . Number of children: Not on file  . Years of education: Not on file  . Highest education level: Not on file  Occupational History  . Not on file  Tobacco Use  . Smoking status: Never Smoker  . Smokeless tobacco: Never Used  Vaping Use  . Vaping Use: Never used  Substance and Sexual Activity  . Alcohol use: No    Alcohol/week: 0.0 standard drinks  . Drug use: No  . Sexual activity: Never  Other Topics Concern  . Not on file  Social History Narrative  . Not on file   Social Determinants of Health   Financial Resource Strain:  Not on file  Food Insecurity: Not on file  Transportation Needs: Not on file  Physical Activity: Not on file  Stress: Not on file  Social Connections: Not on file  Intimate Partner Violence: Not on file     Constitutional: Denies fever, malaise, fatigue, headache or abrupt weight changes.  HEENT: Denies eye pain, eye redness, ear pain, ringing in the ears, wax buildup, runny nose, nasal congestion, bloody nose, or sore throat. Respiratory: Denies difficulty breathing, shortness of breath, cough or sputum production.   Cardiovascular: Denies chest pain, chest tightness, palpitations or swelling in the hands or feet.  Gastrointestinal: Denies abdominal pain, bloating, constipation, diarrhea or blood in the stool.  GU: Denies urgency, frequency, pain with urination, burning sensation, blood in urine, odor or discharge. Musculoskeletal: Denies decrease in range of motion, difficulty with gait, muscle pain or joint pain and swelling.  Skin: Denies redness, rashes, lesions or ulcercations.  Neurological: Denies dizziness, difficulty with memory, difficulty with speech or problems with balance and coordination.  Psych: Denies anxiety, depression, SI/HI.  No other specific complaints in a complete review of systems (except as listed in HPI above).  Objective:   Physical Exam  There were no vitals taken for this visit. Wt Readings from Last 3 Encounters:  07/11/20 137 lb (62.1 kg)  06/08/20 140 lb 8 oz (63.7 kg)  03/22/20 138 lb 1.6 oz (62.6 kg)    General: Appears their stated age, well developed, well nourished in NAD. Skin: Warm, dry and intact. No rashes, lesions or ulcerations noted. HEENT: Head: normal shape and size; Eyes: sclera white, no icterus, conjunctiva pink, PERRLA and EOMs intact; Ears: Tm's gray and intact, normal light reflex; Nose: mucosa pink and moist, septum midline; Throat/Mouth: Teeth present, mucosa pink and moist, no exudate, lesions or ulcerations noted.  Neck:   Neck supple, trachea midline. No masses, lumps or thyromegaly present.  Cardiovascular: Normal rate and rhythm. S1,S2 noted.  No murmur, rubs or gallops noted. No JVD or BLE edema. No carotid bruits noted. Pulmonary/Chest: Normal effort and positive vesicular breath sounds. No respiratory distress. No wheezes, rales or ronchi noted.  Abdomen: Soft and nontender. Normal bowel sounds. No distention or masses noted. Liver, spleen and kidneys non palpable. Musculoskeletal: Normal range of motion. No signs of joint swelling. No difficulty with gait.  Neurological: Alert and oriented. Cranial nerves II-XII grossly intact. Coordination normal.  Psychiatric: Mood and affect normal. Behavior is normal. Judgment and thought content normal.   EKG:  BMET  Component Value Date/Time   NA 140 07/14/2020 0857   NA 140 08/15/2015 0803   K 4.6 07/14/2020 0857   CL 106 07/14/2020 0857   CO2 26 07/14/2020 0857   GLUCOSE 111 (H) 07/14/2020 0857   BUN 19 07/14/2020 0857   BUN 11 08/15/2015 0803   CREATININE 1.06 07/14/2020 0857   CALCIUM 9.1 07/14/2020 0857   GFRNONAA 49 (L) 12/08/2019 1113   GFRAA 57 (L) 12/08/2019 1113    Lipid Panel     Component Value Date/Time   CHOL 112 03/09/2020 0949   CHOL 274 (H) 08/15/2015 0803   TRIG 157.0 (H) 03/09/2020 0949   HDL 47.40 03/09/2020 0949   HDL 38 (L) 08/15/2015 0803   CHOLHDL 2 03/09/2020 0949   VLDL 31.4 03/09/2020 0949   LDLCALC 34 03/09/2020 0949   LDLCALC 184 (H) 08/15/2015 0803    CBC    Component Value Date/Time   WBC 8.7 02/11/2020 1117   RBC 4.27 02/11/2020 1117   HGB 12.4 02/11/2020 1117   HCT 36.8 02/11/2020 1117   PLT 367 02/11/2020 1117   MCV 86.2 02/11/2020 1117   MCH 29.0 02/11/2020 1117   MCHC 33.7 02/11/2020 1117   RDW 15.3 02/11/2020 1117   LYMPHSABS 2.5 12/08/2019 1113   MONOABS 0.6 12/08/2019 1113   EOSABS 0.2 12/08/2019 1113   BASOSABS 0.0 12/08/2019 1113    Hgb A1C Lab Results  Component Value Date   HGBA1C  6.2 07/14/2020            Assessment & Plan:    Webb Silversmith, NP This visit occurred during the SARS-CoV-2 public health emergency.  Safety protocols were in place, including screening questions prior to the visit, additional usage of staff PPE, and extensive cleaning of exam room while observing appropriate contact time as indicated for disinfecting solutions.

## 2020-09-18 ENCOUNTER — Ambulatory Visit: Payer: Medicare HMO | Admitting: Radiation Oncology

## 2020-09-20 ENCOUNTER — Encounter: Payer: Self-pay | Admitting: Radiation Oncology

## 2020-09-20 ENCOUNTER — Ambulatory Visit
Admission: RE | Admit: 2020-09-20 | Discharge: 2020-09-20 | Disposition: A | Discharge status: Home / Self Care | Payer: Medicare HMO | Source: Ambulatory Visit | Attending: Radiation Oncology | Admitting: Radiation Oncology

## 2020-09-20 VITALS — BP 133/70 | HR 85 | Temp 96.9°F | Wt 142.6 lb

## 2020-09-20 DIAGNOSIS — Z170 Estrogen receptor positive status [ER+]: Secondary | ICD-10-CM | POA: Insufficient documentation

## 2020-09-20 DIAGNOSIS — Z79811 Long term (current) use of aromatase inhibitors: Secondary | ICD-10-CM | POA: Diagnosis not present

## 2020-09-20 DIAGNOSIS — C50511 Malignant neoplasm of lower-outer quadrant of right female breast: Secondary | ICD-10-CM | POA: Insufficient documentation

## 2020-09-20 DIAGNOSIS — Z08 Encounter for follow-up examination after completed treatment for malignant neoplasm: Secondary | ICD-10-CM | POA: Diagnosis not present

## 2020-09-20 DIAGNOSIS — Z923 Personal history of irradiation: Secondary | ICD-10-CM | POA: Diagnosis not present

## 2020-09-20 NOTE — Progress Notes (Signed)
Radiation Oncology Follow up Note  Name: Janice Liu   Date:   09/20/2020 MRN:  [id number] DOB: 1940-08-27    This 80 y.o. 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  She is seen today in routine follow-up and is doing well specifically denies breast tenderness cough or bone pain.  Patient has not yet had a mammogram..  She is currently on letrozole tolerating it well without side effect.  COMPLICATIONS OF TREATMENT: none  FOLLOW UP COMPLIANCE: keeps appointments   PHYSICAL EXAM:  BP 133/70   Pulse 85   Temp (!) 96.9 F (36.1 C) (Tympanic)   Wt 142 lb 9.6 oz (64.7 kg)   BMI 25.26 kg/m  Lungs are clear to A&P cardiac examination essentially unremarkable with regular rate and rhythm. No dominant mass or nodularity is noted in either breast in 2 positions examined. Incision is well-healed. No axillary or supraclavicular adenopathy is appreciated. Cosmetic result is excellent.  Well-developed well-nourished patient in NAD. HEENT reveals PERLA, EOMI, discs not visualized.  Oral cavity is clear. No oral mucosal lesions are identified. Neck is clear without evidence of cervical or supraclavicular adenopathy. Lungs are clear to A&P. Cardiac examination is essentially unremarkable with regular rate and rhythm without murmur rub or thrill. Abdomen is benign with no organomegaly or masses noted. Motor sensory and DTR levels are equal and symmetric in the upper and lower extremities. Cranial nerves II through XII are grossly intact. Proprioception is intact. No peripheral adenopathy or edema is identified. No motor or sensory levels are noted. Crude  visual fields are within normal range.  RADIOLOGY RESULTS: No current films to review  PLAN: Present time patient is doing well 6 months out with no evidence of disease.  She continues on letrozole without side effect.  I have asked to see her back in 6 months for follow-up.  She will have a mammogram prior prior to her next examination.  Patient knows to call with any concerns.  I would like to take this opportunity to thank you for allowing me to participate in the care of your patient.Noreene Filbert, MD

## 2020-09-20 NOTE — Progress Notes (Signed)
Survivorship Care Plan visit completed.  Treatment summary reviewed and given to patient.  ASCO answers booklet reviewed and given to patient.  CARE program and Cancer Transitions discussed with patient along with other resources cancer center offers to patients and caregivers.  Patient verbalized understanding.  Patient declined for APP to have a Virtual visit to introduce them to the Survivorship Clinic.  Encouraged patient to call for any questions or concerns. 

## 2020-09-21 ENCOUNTER — Other Ambulatory Visit: Payer: Self-pay | Admitting: Internal Medicine

## 2020-10-05 ENCOUNTER — Other Ambulatory Visit: Payer: Self-pay | Admitting: Cardiovascular Disease

## 2020-10-13 ENCOUNTER — Ambulatory Visit: Payer: Medicare HMO | Admitting: Internal Medicine

## 2020-10-20 ENCOUNTER — Encounter: Payer: Self-pay | Admitting: Internal Medicine

## 2020-10-20 ENCOUNTER — Other Ambulatory Visit: Payer: Self-pay

## 2020-10-20 ENCOUNTER — Ambulatory Visit (INDEPENDENT_AMBULATORY_CARE_PROVIDER_SITE_OTHER): Payer: Medicare HMO | Admitting: Internal Medicine

## 2020-10-20 VITALS — BP 120/78 | HR 87 | Temp 97.2°F | Wt 142.0 lb

## 2020-10-20 DIAGNOSIS — I10 Essential (primary) hypertension: Secondary | ICD-10-CM

## 2020-10-20 DIAGNOSIS — E7800 Pure hypercholesterolemia, unspecified: Secondary | ICD-10-CM

## 2020-10-20 DIAGNOSIS — I214 Non-ST elevation (NSTEMI) myocardial infarction: Secondary | ICD-10-CM | POA: Diagnosis not present

## 2020-10-20 DIAGNOSIS — I5022 Chronic systolic (congestive) heart failure: Secondary | ICD-10-CM | POA: Diagnosis not present

## 2020-10-20 DIAGNOSIS — D509 Iron deficiency anemia, unspecified: Secondary | ICD-10-CM

## 2020-10-20 DIAGNOSIS — I255 Ischemic cardiomyopathy: Secondary | ICD-10-CM

## 2020-10-20 DIAGNOSIS — C50511 Malignant neoplasm of lower-outer quadrant of right female breast: Secondary | ICD-10-CM | POA: Diagnosis not present

## 2020-10-20 DIAGNOSIS — E119 Type 2 diabetes mellitus without complications: Secondary | ICD-10-CM | POA: Diagnosis not present

## 2020-10-20 NOTE — Progress Notes (Signed)
Subjective:    Patient ID: Janice Liu, female    DOB: 07-07-1941, 80 y.o.   MRN: [id number]  HPI  Patient presents to the clinic today for 61-month follow-up of chronic conditions.  HLD with CAD status post MI: She denies angina.  Her last LDL was 34, 02/2020.  She denies myalgias on Rosuvastatin, Fenofibrate and ASA.  She follows with cardiology.  DM2: Her last A1c was 6.2%, 07/2020.  She is not taking any oral diabetic medication at this time.  She checks her feet routinely.    HTN: Her BP today is 120/78.  She is taking Amlodipine, Losartan, Carvedilol and Spironolactone as prescribed.  ECG from 11 2021 reviewed.  CHF, Systolic: Compensated.  Managed on Carvedilol, Amlodipine, Losartan, Spironolactone and Potassium.  Echo from 10/2019 reviewed.  She follows with cardiology.  History of Breast Cancer: Right, status post lumpectomy and radiation.  She is taking Femara as prescribed.  She follows with oncology.  Iron Deficiency Anemia: Her last H/H was 12.4/36.8, 02/2020.Marland Kitchen  She is taking an oral iron supplement.  She follows with hematology.  Review of Systems  Past Medical History:  Diagnosis Date  . Allergy   . CAD (coronary artery disease)   . Chicken pox   . Diabetes mellitus ([place])   . Heart murmur   . HFrEF (heart failure with reduced ejection fraction) ([place])   . Hypertension     Current Outpatient Medications  Medication Sig Dispense Refill  . acetaminophen (TYLENOL) 500 MG tablet Take 500-1,000 mg by mouth every 6 (six) hours as needed (for pain.).    Marland Kitchen alendronate (FOSAMAX) 70 MG tablet TAKE 1 TABLET (70 MG TOTAL) BY MOUTH ONCE A WEEK. TAKE WITH A FULL GLASS OF WATER ON AN EMPTY STOMACH. 12 tablet 1  . amLODipine (NORVASC) 5 MG tablet TAKE 1 TABLET BY MOUTH EVERY DAY 90 tablet 0  . aspirin EC 81 MG tablet Take 1 tablet (81 mg total) by mouth daily. Swallow whole.    . calcium carbonate (OS-CAL - DOSED IN MG OF ELEMENTAL CALCIUM) 1250 (500 Ca) MG tablet Take 500 mg by  mouth daily.     . carvedilol (COREG) 25 MG tablet TAKE 1 TABLET BY MOUTH TWICE A DAY 180 tablet 0  . fenofibrate 54 MG tablet TAKE 1 TABLET BY MOUTH EVERY DAY 90 tablet 0  . ferrous sulfate 325 (65 FE) MG tablet TAKE 1 TABLET BY MOUTH EVERY DAY 90 tablet 0  . ibuprofen (ADVIL) 800 MG tablet Take 1 tablet (800 mg total) by mouth every 8 (eight) hours as needed. 30 tablet 0  . letrozole (FEMARA) 2.5 MG tablet TAKE 1 TABLET BY MOUTH EVERY DAY 90 tablet 1  . losartan (COZAAR) 25 MG tablet TAKE 1 TABLET BY MOUTH EVERY DAY 90 tablet 3  . Omega-3 Fatty Acids (FISH OIL) 500 MG CAPS Take 500 mg by mouth daily.    . rosuvastatin (CRESTOR) 10 MG tablet TAKE 1 TABLET BY MOUTH EVERY DAY 90 tablet 0  . spironolactone (ALDACTONE) 25 MG tablet Take 1 tablet (25 mg total) by mouth daily. 90 tablet 1   No current facility-administered medications for this visit.    No Known Allergies  Family History  Problem Relation Age of Onset  . Heart failure Mother   . Diabetes Mother   . Hypertension Mother   . Congestive Heart Failure Brother   . Hypertension Brother   . Cerebral palsy Son   . Congestive Heart Failure Brother   .  Breast cancer Sister 39  . Thyroid cancer Sister     Social History   Socioeconomic History  . Marital status: Widowed    Spouse name: Not on file  . Number of children: Not on file  . Years of education: Not on file  . Highest education level: Not on file  Occupational History  . Not on file  Tobacco Use  . Smoking status: Never Smoker  . Smokeless tobacco: Never Used  Vaping Use  . Vaping Use: Never used  Substance and Sexual Activity  . Alcohol use: No    Alcohol/week: 0.0 standard drinks  . Drug use: No  . Sexual activity: Never  Other Topics Concern  . Not on file  Social History Narrative  . Not on file   Social Determinants of Health   Financial Resource Strain: Not on file  Food Insecurity: Not on file  Transportation Needs: Not on file  Physical  Activity: Not on file  Stress: Not on file  Social Connections: Not on file  Intimate Partner Violence: Not on file     Constitutional: Denies fever, malaise, fatigue, headache or abrupt weight changes.  HEENT: Denies eye pain, eye redness, ear pain, ringing in the ears, wax buildup, runny nose, nasal congestion, bloody nose, or sore throat. Respiratory: Denies difficulty breathing, shortness of breath, cough or sputum production.   Cardiovascular: Denies chest pain, chest tightness, palpitations or swelling in the hands or feet.  Gastrointestinal: Denies abdominal pain, bloating, constipation, diarrhea or blood in the stool.  GU: Denies urgency, frequency, pain with urination, burning sensation, blood in urine, odor or discharge. Musculoskeletal: Denies decrease in range of motion, difficulty with gait, muscle pain or joint pain and swelling.  Skin: Denies redness, rashes, lesions or ulcercations.  Neurological: Denies dizziness, difficulty with memory, difficulty with speech or problems with balance and coordination.  Psych: Denies anxiety, depression, SI/HI.  No other specific complaints in a complete review of systems (except as listed in HPI above).     Objective:   Physical Exam   BP 120/78   Pulse 87   Temp (!) 97.2 F (36.2 C) (Temporal)   Wt 64.4 kg   SpO2 98%   BMI 25.15 kg/m   Wt Readings from Last 3 Encounters:  09/20/20 142 lb 9.6 oz (64.7 kg)  07/11/20 137 lb (62.1 kg)  06/08/20 140 lb 8 oz (63.7 kg)    General: Appears her stated age, well developed, well nourished in NAD. Skin: Warm, dry and intact. No  ulcerations noted. HEENT: Head: normal shape and size; Eyes: sclera white, no icterus, conjunctiva pink, PERRLA and EOMs intact;  Cardiovascular: Normal rate and rhythm. S1,S2 noted.  No murmur, rubs or gallops noted. No JVD or BLE edema. No carotid bruits noted. Pulmonary/Chest: Normal effort and positive vesicular breath sounds. No respiratory distress. No  wheezes, rales or ronchi noted.  Musculoskeletal: Kyphotic.Marland Kitchen No difficulty with gait.  Neurological: Alert and oriented.    BMET    Component Value Date/Time   NA 140 07/14/2020 0857   NA 140 08/15/2015 0803   K 4.6 07/14/2020 0857   CL 106 07/14/2020 0857   CO2 26 07/14/2020 0857   GLUCOSE 111 (H) 07/14/2020 0857   BUN 19 07/14/2020 0857   BUN 11 08/15/2015 0803   CREATININE 1.06 07/14/2020 0857   CALCIUM 9.1 07/14/2020 0857   GFRNONAA 49 (L) 12/08/2019 1113   GFRAA 57 (L) 12/08/2019 1113    Lipid Panel  Component Value Date/Time   CHOL 112 03/09/2020 0949   CHOL 274 (H) 08/15/2015 0803   TRIG 157.0 (H) 03/09/2020 0949   HDL 47.40 03/09/2020 0949   HDL 38 (L) 08/15/2015 0803   CHOLHDL 2 03/09/2020 0949   VLDL 31.4 03/09/2020 0949   LDLCALC 34 03/09/2020 0949   LDLCALC 184 (H) 08/15/2015 0803    CBC    Component Value Date/Time   WBC 8.7 02/11/2020 1117   RBC 4.27 02/11/2020 1117   HGB 12.4 02/11/2020 1117   HCT 36.8 02/11/2020 1117   PLT 367 02/11/2020 1117   MCV 86.2 02/11/2020 1117   MCH 29.0 02/11/2020 1117   MCHC 33.7 02/11/2020 1117   RDW 15.3 02/11/2020 1117   LYMPHSABS 2.5 12/08/2019 1113   MONOABS 0.6 12/08/2019 1113   EOSABS 0.2 12/08/2019 1113   BASOSABS 0.0 12/08/2019 1113    Hgb A1C Lab Results  Component Value Date   HGBA1C 6.2 07/14/2020           Assessment & Plan:     Janice Silversmith, NP This visit occurred during the SARS-CoV-2 public health emergency.  Safety protocols were in place, including screening questions prior to the visit, additional usage of staff PPE, and extensive cleaning of exam room while observing appropriate contact time as indicated for disinfecting solutions.

## 2020-10-21 LAB — CBC WITH DIFFERENTIAL/PLATELET
Basophils Absolute: 0 10*3/uL (ref 0.0–0.2)
Basos: 0 %
EOS (ABSOLUTE): 0.2 10*3/uL (ref 0.0–0.4)
Eos: 3 %
Hematocrit: 43.4 % (ref 34.0–46.6)
Hemoglobin: 14.5 g/dL (ref 11.1–15.9)
Immature Grans (Abs): 0 10*3/uL (ref 0.0–0.1)
Immature Granulocytes: 0 %
Lymphocytes Absolute: 2.5 10*3/uL (ref 0.7–3.1)
Lymphs: 28 %
MCH: 29.9 pg (ref 26.6–33.0)
MCHC: 33.4 g/dL (ref 31.5–35.7)
MCV: 90 fL (ref 79–97)
Monocytes Absolute: 0.6 10*3/uL (ref 0.1–0.9)
Monocytes: 7 %
Neutrophils Absolute: 5.8 10*3/uL (ref 1.4–7.0)
Neutrophils: 62 %
Platelets: 438 10*3/uL (ref 150–450)
RBC: 4.85 x10E6/uL (ref 3.77–5.28)
RDW: 11.9 % (ref 11.7–15.4)
WBC: 9.2 10*3/uL (ref 3.4–10.8)

## 2020-10-21 LAB — HEMOGLOBIN A1C
Est. average glucose Bld gHb Est-mCnc: 131 mg/dL
Hgb A1c MFr Bld: 6.2 % — ABNORMAL HIGH (ref 4.8–5.6)

## 2020-10-21 LAB — TRANSFERRIN: Transferrin: 334 mg/dL (ref 192–364)

## 2020-10-21 LAB — BASIC METABOLIC PANEL
BUN/Creatinine Ratio: 18 (ref 12–28)
BUN: 19 mg/dL (ref 8–27)
CO2: 19 mmol/L — ABNORMAL LOW (ref 20–29)
Calcium: 10.1 mg/dL (ref 8.7–10.3)
Chloride: 103 mmol/L (ref 96–106)
Creatinine, Ser: 1.07 mg/dL — ABNORMAL HIGH (ref 0.57–1.00)
Glucose: 111 mg/dL — ABNORMAL HIGH (ref 65–99)
Potassium: 4.8 mmol/L (ref 3.5–5.2)
Sodium: 138 mmol/L (ref 134–144)
eGFR: 53 mL/min/{1.73_m2} — ABNORMAL LOW (ref >59)

## 2020-10-21 LAB — IRON AND TIBC
Iron Saturation: 20 % (ref 15–55)
Iron: 74 ug/dL (ref 27–139)
Total Iron Binding Capacity: 363 ug/dL (ref 250–450)
UIBC: 289 ug/dL (ref 118–369)

## 2020-10-21 LAB — FERRITIN: Ferritin: 147 ng/mL (ref 15–150)

## 2020-10-22 ENCOUNTER — Other Ambulatory Visit: Payer: Self-pay | Admitting: Internal Medicine

## 2020-10-22 ENCOUNTER — Encounter: Payer: Self-pay | Admitting: Internal Medicine

## 2020-10-22 DIAGNOSIS — E7800 Pure hypercholesterolemia, unspecified: Secondary | ICD-10-CM

## 2020-10-22 NOTE — Assessment & Plan Note (Signed)
A1c today No urine microalbumin secondary to ARB therapy Encouraged her to consume a low carb diet Encourage routine foot exams Encourage routine eye exams

## 2020-10-22 NOTE — Assessment & Plan Note (Signed)
In remission Continue Femara Mammogram scheduled

## 2020-10-22 NOTE — Assessment & Plan Note (Signed)
C-Met and lipid profile today Continue rosuvastatin, fenofibrate and aspirin Encouraged her to consume a low-fat diet

## 2020-10-22 NOTE — Assessment & Plan Note (Signed)
C met today Continue carvedilol, amlodipine, losartan, spironolactone and potassium She will continue to follow with cardiology Monitor daily weights Encourage low-salt diet

## 2020-10-22 NOTE — Assessment & Plan Note (Signed)
Managed on rosuvastatin, fenofibrate, aspirin, amlodipine, losartan, carvedilol, spironolactone She will continue to follow with cardiology

## 2020-10-22 NOTE — Patient Instructions (Signed)

## 2020-10-22 NOTE — Assessment & Plan Note (Signed)
Continue amlodipine, losartan, carvedilol and spironolactone C met today Reinforced DASH diet

## 2020-10-22 NOTE — Assessment & Plan Note (Signed)
CBC, IBC panel and ferritin today Continue oral iron

## 2020-11-20 ENCOUNTER — Other Ambulatory Visit: Payer: Self-pay | Admitting: Internal Medicine

## 2020-11-24 ENCOUNTER — Other Ambulatory Visit: Payer: Self-pay | Admitting: Oncology

## 2020-11-27 ENCOUNTER — Other Ambulatory Visit: Payer: Self-pay | Admitting: Oncology

## 2020-12-03 NOTE — Progress Notes (Signed)
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  INTERVAL HISTORY: Patient returns to clinic today for routine 63-monthevaluation.  She is tolerating letrozole and Fosamax well without significant side effects.  She currently feels well and is at her baseline.  She has no neurologic complaints.  She denies any recent fevers or illnesses.  She has a good appetite and denies weight loss. She has no chest pain, shortness of breath, cough, or hemoptysis.  She denies any nausea, vomiting, constipation, or diarrhea.  She has no melena or hematochezia.  She has no urinary complaints.  Patient offers no specific complaints today.  REVIEW OF SYSTEMS:   Review of Systems  Constitutional: Negative.  Negative for fever, malaise/fatigue and weight loss.  Respiratory: Negative.  Negative for cough and shortness of breath.   Cardiovascular: Negative.  Negative for chest pain and leg swelling.  Gastrointestinal: Negative.  Negative for abdominal pain, blood in stool and melena.  Genitourinary: Negative.  Negative for hematuria.  Musculoskeletal: Negative.  Negative for back pain.  Skin: Negative.  Negative for rash.  Neurological: Negative.  Negative for dizziness, focal weakness, weakness and headaches.  Psychiatric/Behavioral: Negative.  The patient is not nervous/anxious.     As per HPI. Otherwise, a complete review of systems is  negative.  PAST MEDICAL HISTORY: Past Medical History:  Diagnosis Date  . Allergy   . CAD (coronary artery disease)   . Chicken pox   . Diabetes mellitus ([place]   . Heart murmur   . HFrEF (heart failure with reduced ejection fraction) ([facility]   . Hypertension     PAST SURGICAL HISTORY: Past Surgical History:  Procedure Laterality Date  . BREAST BIOPSY Right 11/04/2019   Affirm Bx- path pending- Ribbon Clip  . BREAST BIOPSY Left 11/04/2019   Affirm Bx- path pending- Coil Clip  . BREAST BIOPSY Left 11/12/2019   uKoreabx/ ribbon clip/ path pending  . BREAST CYST ASPIRATION Left 11/12/2019   2 areas  . BREAST LUMPECTOMY Right 12/10/2019   Procedure: BREAST LUMPECTOMY;  Surgeon: RRonny Bacon MD;  Location: [facility] ORS;  Service: General;  Laterality: Right;  . CARDIAC CATHETERIZATION    . CORONARY STENT INTERVENTION N/A 08/09/2019   Procedure: CORONARY STENT INTERVENTION;  Surgeon: ENelva Bush MD;  [facility];  Service: Cardiovascular;  Laterality: N/A;  . LEFT HEART CATH AND CORONARY ANGIOGRAPHY N/A 08/09/2019   Procedure: LEFT HEART CATH AND CORONARY ANGIOGRAPHY;  Surgeon: ENelva Bush MD;  [facility];  Service: Cardiovascular;  Laterality: N/A;    FAMILY HISTORY: Family History  Problem Relation Age of Onset  . Heart failure Mother   . Diabetes Mother   . Hypertension Mother   . Congestive Heart Failure Brother   . Hypertension Brother   . Cerebral palsy Son   . Congestive Heart Failure Brother   . Breast cancer Sister 729 . Thyroid cancer Sister     ADVANCED DIRECTIVES (Y/N):  N  HEALTH MAINTENANCE:  Social History   Tobacco Use  . Smoking status: Never Smoker  . Smokeless tobacco: Never Used  Vaping Use  . Vaping Use: Never used  Substance Use Topics  . Alcohol use: No    Alcohol/week: 0.0 standard drinks  . Drug use: No     Colonoscopy:  PAP:  Bone density:  Lipid panel:  No Known Allergies  Current  Outpatient Medications  Medication Sig Dispense Refill  . acetaminophen (TYLENOL) 500 MG tablet Take 500-1,000 mg by mouth every 6 (six) hours as needed (for pain.).    Marland Kitchen alendronate (FOSAMAX) 70 MG tablet TAKE 1 TABLET (70 MG TOTAL) BY MOUTH ONCE A WEEK. TAKE WITH A FULL GLASS OF WATER ON AN EMPTY STOMACH. 12 tablet 3  . amLODipine (NORVASC) 5 MG tablet TAKE 1 TABLET BY MOUTH EVERY DAY 90 tablet 0  . aspirin EC 81 MG tablet Take 1 tablet (81 mg total) by mouth daily. Swallow whole.    . calcium carbonate (OS-CAL - DOSED IN MG OF ELEMENTAL CALCIUM) 1250 (500 Ca) MG tablet Take 500 mg by mouth daily.     . carvedilol (COREG) 25 MG tablet TAKE 1 TABLET BY MOUTH TWICE A DAY 180 tablet 0  . cyanocobalamin 2000 MCG tablet Take 2,000 mcg by mouth daily.    . fenofibrate 54 MG tablet TAKE 1 TABLET BY MOUTH EVERY DAY 90 tablet 0  . ferrous sulfate 325 (65 FE) MG tablet TAKE 1 TABLET BY MOUTH EVERY DAY 90 tablet 0  . letrozole (FEMARA) 2.5 MG tablet TAKE 1 TABLET BY MOUTH EVERY DAY 90 tablet 3  . losartan (COZAAR) 25 MG tablet TAKE 1 TABLET BY MOUTH EVERY DAY 90 tablet 3  . Omega-3 Fatty Acids (FISH OIL) 500 MG CAPS Take 500 mg by mouth daily.    . rosuvastatin (CRESTOR) 10 MG tablet TAKE 1 TABLET BY MOUTH EVERY DAY 90 tablet 1  . spironolactone (ALDACTONE) 25 MG tablet Take 1 tablet (25 mg total) by mouth daily. 90 tablet 1   No current facility-administered medications for this visit.    OBJECTIVE: Vitals:   12/07/20 1035  BP: 135/62  Pulse: 82  Resp: 20  Temp: 97.9 F (36.6 C)  SpO2: 96%     Body mass index is 26.29 kg/m.    ECOG FS:0 - Asymptomatic  General: Well-developed, well-nourished, no acute distress. Eyes: Pink conjunctiva, anicteric sclera. HEENT: Normocephalic, moist mucous membranes. Breast: Bilateral breast and axilla without lumps or masses. Lungs: No audible wheezing or coughing. Heart: Regular rate and rhythm. Abdomen: Soft, nontender, no obvious  distention. Musculoskeletal: No edema, cyanosis, or clubbing. Neuro: Alert, answering all questions appropriately. Cranial nerves grossly intact. Skin: No rashes or petechiae noted. Psych: Normal affect.   LAB RESULTS:  Lab Results  Component Value Date   NA 138 10/20/2020   K 4.8 10/20/2020   CL 103 10/20/2020   CO2 19 (L) 10/20/2020   GLUCOSE 111 (H) 10/20/2020   BUN 19 10/20/2020   CREATININE 1.07 (H) 10/20/2020   CALCIUM 10.1 10/20/2020   PROT 7.7 03/09/2020   ALBUMIN 4.5 03/09/2020   AST 33 03/09/2020   ALT 43 (H) 03/09/2020   ALKPHOS 63 03/09/2020   BILITOT 0.7 03/09/2020   GFRNONAA 49 (L) 12/08/2019   GFRAA 57 (L) 12/08/2019    Lab Results  Component Value Date   WBC 9.2 10/20/2020   NEUTROABS 5.8 10/20/2020   HGB 14.5 10/20/2020   HCT 43.4 10/20/2020   MCV 90 10/20/2020  PLT 438 10/20/2020   Lab Results  Component Value Date   IRON 74 10/20/2020   TIBC 363 10/20/2020   IRONPCTSAT 20 10/20/2020   Lab Results  Component Value Date   FERRITIN 147 10/20/2020     STUDIES: No results found.  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  Given the fact that she is grade 1 and mucinous, Oncotype DX was not necessary.  Patient completed adjuvant XRT in July 2021.  Continue letrozole for a total of 5 years completing treatment in July 2026.  Patient will require mammogram in the next 1 to 2 weeks.  Return to clinic in 6 months for routine evaluation.  2.  Osteoporosis: Bone mineral density completed on March 23, 2020 revealed a T score of -3.1.  Patient was initiated on Fosamax and instructed to continue calcium and vitamin D supplementation.  Repeat bone mineral  density in August 2022.  If there is no significant improvement, will consider discontinuing letrozole and initiating tamoxifen. 3.  Iron deficiency anemia: Resolved.  Patient's hemoglobin is 14.5.  She last received IV Venofer on November 22, 2019.   Patient expressed understanding and was in agreement with this plan. She also understands that She can call clinic at any time with any questions, concerns, or complaints.   Cancer Staging Carcinoma of lower-outer quadrant of female breast, right [facility]) Staging form: Breast, AJCC 8th Edition - Clinical stage from 01/07/2020: Stage IB (cT2, cN0, cM0, G1, ER+, PR+, HER2-) - Signed by Lloyd Huger, MD on 01/07/2020 Stage prefix: Initial diagnosis Histologic grading system: 3 grade system   Lloyd Huger, MD   12/07/2020 11:55 AM

## 2020-12-04 ENCOUNTER — Telehealth: Payer: Self-pay | Admitting: Internal Medicine

## 2020-12-04 NOTE — Telephone Encounter (Signed)
Patient would like to be schedule for a AWV after 03/09/2020.

## 2020-12-07 ENCOUNTER — Encounter: Payer: Self-pay | Admitting: Oncology

## 2020-12-07 ENCOUNTER — Other Ambulatory Visit: Payer: Self-pay

## 2020-12-07 ENCOUNTER — Inpatient Hospital Stay: Payer: Medicare HMO | Attending: Oncology | Admitting: Oncology

## 2020-12-07 VITALS — BP 135/62 | HR 82 | Temp 97.9°F | Resp 20 | Wt 148.4 lb

## 2020-12-07 DIAGNOSIS — Z79899 Other long term (current) drug therapy: Secondary | ICD-10-CM | POA: Diagnosis not present

## 2020-12-07 DIAGNOSIS — Z79811 Long term (current) use of aromatase inhibitors: Secondary | ICD-10-CM | POA: Insufficient documentation

## 2020-12-07 DIAGNOSIS — Z803 Family history of malignant neoplasm of breast: Secondary | ICD-10-CM | POA: Insufficient documentation

## 2020-12-07 DIAGNOSIS — I10 Essential (primary) hypertension: Secondary | ICD-10-CM | POA: Diagnosis not present

## 2020-12-07 DIAGNOSIS — M810 Age-related osteoporosis without current pathological fracture: Secondary | ICD-10-CM | POA: Diagnosis not present

## 2020-12-07 DIAGNOSIS — I2510 Atherosclerotic heart disease of native coronary artery without angina pectoris: Secondary | ICD-10-CM | POA: Diagnosis not present

## 2020-12-07 DIAGNOSIS — Z7982 Long term (current) use of aspirin: Secondary | ICD-10-CM | POA: Diagnosis not present

## 2020-12-07 DIAGNOSIS — Z170 Estrogen receptor positive status [ER+]: Secondary | ICD-10-CM | POA: Insufficient documentation

## 2020-12-07 DIAGNOSIS — E119 Type 2 diabetes mellitus without complications: Secondary | ICD-10-CM | POA: Insufficient documentation

## 2020-12-07 DIAGNOSIS — C50511 Malignant neoplasm of lower-outer quadrant of right female breast: Secondary | ICD-10-CM | POA: Diagnosis not present

## 2020-12-07 NOTE — Progress Notes (Signed)
Patient denies any concerns today.  

## 2020-12-13 ENCOUNTER — Other Ambulatory Visit: Payer: Self-pay

## 2020-12-13 ENCOUNTER — Ambulatory Visit
Admission: RE | Admit: 2020-12-13 | Discharge: 2020-12-13 | Disposition: A | Discharge status: Home / Self Care | Payer: Medicare HMO | Source: Ambulatory Visit | Attending: Oncology | Admitting: Oncology

## 2020-12-13 ENCOUNTER — Ambulatory Visit: Payer: Medicare HMO

## 2020-12-13 DIAGNOSIS — R922 Inconclusive mammogram: Secondary | ICD-10-CM | POA: Diagnosis not present

## 2020-12-13 DIAGNOSIS — C50511 Malignant neoplasm of lower-outer quadrant of right female breast: Secondary | ICD-10-CM | POA: Diagnosis not present

## 2020-12-18 ENCOUNTER — Other Ambulatory Visit: Payer: Self-pay | Admitting: Internal Medicine

## 2020-12-19 ENCOUNTER — Other Ambulatory Visit: Payer: Self-pay | Admitting: Internal Medicine

## 2020-12-20 ENCOUNTER — Other Ambulatory Visit: Payer: Self-pay

## 2020-12-20 MED ORDER — AMLODIPINE BESYLATE 5 MG PO TABS
1.0000 | ORAL_TABLET | Freq: Every day | ORAL | 0 refills | Status: DC
Start: 2020-12-20 — End: 2021-07-03

## 2021-01-04 ENCOUNTER — Other Ambulatory Visit: Payer: Self-pay

## 2021-01-04 ENCOUNTER — Encounter: Payer: Self-pay | Admitting: Cardiovascular Disease

## 2021-01-04 ENCOUNTER — Ambulatory Visit: Payer: Medicare HMO | Admitting: Cardiovascular Disease

## 2021-01-04 VITALS — BP 132/70 | HR 88 | Ht 63.0 in | Wt 150.1 lb

## 2021-01-04 DIAGNOSIS — I10 Essential (primary) hypertension: Secondary | ICD-10-CM

## 2021-01-04 DIAGNOSIS — I2510 Atherosclerotic heart disease of native coronary artery without angina pectoris: Secondary | ICD-10-CM | POA: Diagnosis not present

## 2021-01-04 DIAGNOSIS — I5022 Chronic systolic (congestive) heart failure: Secondary | ICD-10-CM

## 2021-01-04 DIAGNOSIS — I255 Ischemic cardiomyopathy: Secondary | ICD-10-CM

## 2021-01-04 DIAGNOSIS — E785 Hyperlipidemia, unspecified: Secondary | ICD-10-CM | POA: Diagnosis not present

## 2021-01-04 NOTE — Patient Instructions (Signed)
Medication Instructions:  Your physician recommends that you continue on your current medications as directed. Please refer to the Current Medication list given to you today.  *If you need a refill on your cardiac medications before your next appointment, please call your pharmacy*   Lab Work: None orderde If you have labs (blood work) drawn today and your tests are completely normal, you will receive your results only by: Marland Kitchen MyChart Message (if you have MyChart) OR . A paper copy in the mail If you have any lab test that is abnormal or we need to change your treatment, we will call you to review the results.   Testing/Procedures: None ordered   Follow-Up: At [facility], you and your health needs are our priority.  As part of our continuing mission to provide you with exceptional heart care, we have created designated Provider Care Teams.  These Care Teams include your primary Cardiologist (physician) and Advanced Practice Providers (APPs -  Physician Assistants and Nurse Practitioners) who all work together to provide you with the care you need, when you need it.  We recommend signing up for the patient portal called "MyChart".  Sign up information is provided on this After Visit Summary.  MyChart is used to connect with patients for Virtual Visits (Telemedicine).  Patients are able to view lab/test results, encounter notes, upcoming appointments, etc.  Non-urgent messages can be sent to your provider as well.   To learn more about what you can do with MyChart, go to [email or web address].    Your next appointment:   Your physician wants you to follow-up in: 6 months You will receive a reminder letter in the mail two months in advance. If you don't receive a letter, please call our office to schedule the follow-up appointment.   The format for your next appointment:   In Person  Provider:   You may see Kathlyn Sacramento, MD or one of the following Advanced Practice Providers on  your designated Care Team:    Murray Hodgkins, NP  Christell Faith, PA-C  Marrianne Mood, PA-C  Cadence [place], [place]  Laurann Montana, NP    Other Instructions N/A

## 2021-01-04 NOTE — Progress Notes (Signed)
Cardiology Office Note   Date:  01/04/2021   ID:  Janice Liu, DOB 10/18/1940, MRN [id number]  PCP:  Jearld Fenton, NP  Cardiologist:   Kathlyn Sacramento, MD   Chief Complaint  Patient presents with  . Other    6 month f/u. Meds reviewed verbally with pt.      History of Present Illness: Janice Liu is a 80 y.o. female who presents for a follow-up visit regarding coronary artery disease and chronic systolic heart failure.  She has chronic medical conditions that include essential hypertension, hyperlipidemia and type 2 diabetes. She presented in December 2020 with non-ST elevation myocardial infarction.  Peak troponin was greater than 27,000.  Cardiac catheterization showed severe one-vessel coronary artery disease with 90% ulcerative stenosis in the mid LAD and moderate proximal LAD and distal RCA disease.  Ejection fraction was 40 to 45% with mildly elevated left ventricular end-diastolic pressure.  She underwent successful angioplasty and drug-eluting stent placement to the mid LAD.  She had a follow-up echocardiogram in March, 2021 that showed improvement in ejection fraction to 50%.  She was diagnosed with right breast cancer and underwent right lumpectomy in April 2021.   She has been doing well with no recent chest pain, shortness of breath or palpitations.  She has mild bilateral leg edema.  Past Medical History:  Diagnosis Date  . Allergy   . CAD (coronary artery disease)   . Chicken pox   . Diabetes mellitus ([place])   . Heart murmur   . HFrEF (heart failure with reduced ejection fraction) ([place])   . Hypertension     Past Surgical History:  Procedure Laterality Date  . BREAST BIOPSY Right 11/04/2019   Affirm Bx- path pending- Ribbon Clip  . BREAST BIOPSY Left 11/04/2019   Affirm Bx- path pending- Coil Clip  . BREAST BIOPSY Left 11/12/2019   [place] bx/ ribbon clip/ path pending  . BREAST CYST ASPIRATION Left 11/12/2019   2 areas  . BREAST LUMPECTOMY Right  12/10/2019   Procedure: BREAST LUMPECTOMY;  Surgeon: Ronny Bacon, MD;  Location: [facility] ORS;  Service: General;  Laterality: Right;  . CARDIAC CATHETERIZATION    . CORONARY STENT INTERVENTION N/A 08/09/2019   Procedure: CORONARY STENT INTERVENTION;  Surgeon: Nelva Bush, MD;  Location: [place] [facility];  Service: Cardiovascular;  Laterality: N/A;  . LEFT HEART CATH AND CORONARY ANGIOGRAPHY N/A 08/09/2019   Procedure: LEFT HEART CATH AND CORONARY ANGIOGRAPHY;  Surgeon: Nelva Bush, MD;  Location: [place] [facility];  Service: Cardiovascular;  Laterality: N/A;     Current Outpatient Medications  Medication Sig Dispense Refill  . acetaminophen (TYLENOL) 500 MG tablet Take 500-1,000 mg by mouth every 6 (six) hours as needed (for pain.).    Marland Kitchen alendronate (FOSAMAX) 70 MG tablet TAKE 1 TABLET (70 MG TOTAL) BY MOUTH ONCE A WEEK. TAKE WITH A FULL GLASS OF WATER ON AN EMPTY STOMACH. 12 tablet 3  . amLODipine (NORVASC) 5 MG tablet Take 1 tablet (5 mg total) by mouth daily. 90 tablet 0  . aspirin EC 81 MG tablet Take 1 tablet (81 mg total) by mouth daily. Swallow whole.    . calcium carbonate (OS-CAL - DOSED IN MG OF ELEMENTAL CALCIUM) 1250 (500 Ca) MG tablet Take 500 mg by mouth daily.     . carvedilol (COREG) 25 MG tablet TAKE 1 TABLET BY MOUTH TWICE A DAY 180 tablet 0  . cyanocobalamin 2000 MCG tablet Take 2,000 mcg by mouth  daily.    . fenofibrate 54 MG tablet TAKE 1 TABLET BY MOUTH EVERY DAY 90 tablet 0  . ferrous sulfate 325 (65 FE) MG tablet TAKE 1 TABLET BY MOUTH EVERY DAY 90 tablet 0  . letrozole (FEMARA) 2.5 MG tablet TAKE 1 TABLET BY MOUTH EVERY DAY 90 tablet 3  . losartan (COZAAR) 25 MG tablet TAKE 1 TABLET BY MOUTH EVERY DAY 90 tablet 3  . Omega-3 Fatty Acids (FISH OIL) 500 MG CAPS Take 500 mg by mouth daily.    . rosuvastatin (CRESTOR) 10 MG tablet TAKE 1 TABLET BY MOUTH EVERY DAY 90 tablet 1  . spironolactone (ALDACTONE) 25 MG tablet Take 1 tablet (25 mg total) by mouth  daily. 90 tablet 1   No current facility-administered medications for this visit.    Allergies:   Patient has no known allergies.    Social History:  The patient  reports that she has never smoked. She has never used smokeless tobacco. She reports that she does not drink alcohol and does not use drugs.   Family History:  The patient's family history includes Breast cancer (age of onset: 63) in her sister; Cerebral palsy in her son; Congestive Heart Failure in her brother and brother; Diabetes in her mother; Heart failure in her mother; Hypertension in her brother and mother; Thyroid cancer in her sister.    ROS:  Please see the history of present illness.   Otherwise, review of systems are positive for none.   All other systems are reviewed and negative.    PHYSICAL EXAM: VS:  BP 132/70 (BP Location: Left Arm, Patient Position: Sitting, Cuff Size: Large)   Pulse 88   Ht 5\' 3"  (1.6 m)   Wt 150 lb 2 oz (68.1 kg)   SpO2 95%   BMI 26.59 kg/m  , BMI Body mass index is 26.59 kg/m. GEN: Well nourished, well developed, in no acute distress  HEENT: normal  Neck: no JVD, carotid bruits, or masses Cardiac: RRR; no murmurs, rubs, or gallops, mild bilateral leg edema edema  Respiratory:  clear to auscultation bilaterally, normal work of breathing GI: soft, nontender, nondistended, + BS MS: no deformity or atrophy  Skin: warm and dry, no rash Neuro:  Strength and sensation are intact Psych: euthymic mood, full affect   EKG:  EKG is ordered today. The ekg ordered today demonstrates normal sinus rhythm with poor R wave progression in the anterior leads.   Recent Labs: 03/09/2020: ALT 43 10/20/2020: BUN 19; Creatinine, Ser 1.07; Hemoglobin 14.5; Platelets 438; Potassium 4.8; Sodium 138    Lipid Panel    Component Value Date/Time   CHOL 112 03/09/2020 0949   CHOL 274 (H) 08/15/2015 0803   TRIG 157.0 (H) 03/09/2020 0949   HDL 47.40 03/09/2020 0949   HDL 38 (L) 08/15/2015 0803    CHOLHDL 2 03/09/2020 0949   VLDL 31.4 03/09/2020 0949   LDLCALC 34 03/09/2020 0949   LDLCALC 184 (H) 08/15/2015 0803   LDLDIRECT 87.0 07/01/2019 0916      Wt Readings from Last 3 Encounters:  01/04/21 150 lb 2 oz (68.1 kg)  12/07/20 148 lb 6.4 oz (67.3 kg)  10/20/20 142 lb (64.4 kg)       ASSESSMENT AND PLAN:  1.  Coronary artery disease involving native coronary arteries without angina: She is doing extremely well at the present time with no anginal symptoms.  Continue medical therapy and aspirin 81 mg daily.  2.  Chronic systolic heart failure due  to ischemic cardiomyopathy: Ejection fraction was 40 to 45% but improved to 50% on most recent echocardiogram.   Continue treatment with carvedilol, losartan and spironolactone.   3.  Essential hypertension: Blood pressure is well controlled on current medications  4.  Hyperlipidemia: Currently on rosuvastatin 10 mg daily, fenofibrate and fish oil.  Most recent lipid profile showed an LDL of 34 and triglyceride of 157  5.  Breast cancer: Followed by oncology and currently is on Femara.   Disposition:   Follow-up in 6 months.   Signed,  Kathlyn Sacramento, MD  01/04/2021 2:59 PM    [facility]

## 2021-01-25 ENCOUNTER — Other Ambulatory Visit: Payer: Self-pay | Admitting: Internal Medicine

## 2021-02-03 ENCOUNTER — Other Ambulatory Visit: Payer: Self-pay | Admitting: Internal Medicine

## 2021-02-03 ENCOUNTER — Other Ambulatory Visit: Payer: Self-pay | Admitting: Cardiovascular Disease

## 2021-02-03 NOTE — Telephone Encounter (Signed)
Last RF 12/21/20 #90  (Fenofibrate) Last RF 01/25/21 #90 (Ferrous Sulfate)

## 2021-02-19 ENCOUNTER — Other Ambulatory Visit: Payer: Self-pay | Admitting: Internal Medicine

## 2021-02-19 ENCOUNTER — Other Ambulatory Visit: Payer: Self-pay | Admitting: Cardiovascular Disease

## 2021-03-03 ENCOUNTER — Other Ambulatory Visit: Payer: Self-pay | Admitting: Cardiovascular Disease

## 2021-03-13 ENCOUNTER — Encounter: Payer: Self-pay | Admitting: Internal Medicine

## 2021-03-13 DIAGNOSIS — E559 Vitamin D deficiency, unspecified: Secondary | ICD-10-CM | POA: Diagnosis not present

## 2021-03-13 DIAGNOSIS — N1831 Chronic kidney disease, stage 3a: Secondary | ICD-10-CM | POA: Diagnosis not present

## 2021-03-13 DIAGNOSIS — I129 Hypertensive chronic kidney disease with stage 1 through stage 4 chronic kidney disease, or unspecified chronic kidney disease: Secondary | ICD-10-CM | POA: Diagnosis not present

## 2021-03-14 ENCOUNTER — Encounter: Payer: Self-pay | Admitting: Oncology

## 2021-03-15 ENCOUNTER — Other Ambulatory Visit: Payer: Self-pay

## 2021-03-15 ENCOUNTER — Ambulatory Visit (INDEPENDENT_AMBULATORY_CARE_PROVIDER_SITE_OTHER): Payer: Medicare HMO | Admitting: Internal Medicine

## 2021-03-15 ENCOUNTER — Encounter: Payer: Self-pay | Admitting: Internal Medicine

## 2021-03-15 VITALS — BP 129/62 | HR 82 | Temp 97.5°F | Resp 17 | Ht 63.0 in | Wt 154.6 lb

## 2021-03-15 DIAGNOSIS — M810 Age-related osteoporosis without current pathological fracture: Secondary | ICD-10-CM

## 2021-03-15 DIAGNOSIS — I2510 Atherosclerotic heart disease of native coronary artery without angina pectoris: Secondary | ICD-10-CM | POA: Diagnosis not present

## 2021-03-15 DIAGNOSIS — Z23 Encounter for immunization: Secondary | ICD-10-CM

## 2021-03-15 DIAGNOSIS — E1122 Type 2 diabetes mellitus with diabetic chronic kidney disease: Secondary | ICD-10-CM | POA: Diagnosis not present

## 2021-03-15 DIAGNOSIS — I700 Atherosclerosis of aorta: Secondary | ICD-10-CM | POA: Diagnosis not present

## 2021-03-15 DIAGNOSIS — C50511 Malignant neoplasm of lower-outer quadrant of right female breast: Secondary | ICD-10-CM | POA: Diagnosis not present

## 2021-03-15 DIAGNOSIS — I214 Non-ST elevation (NSTEMI) myocardial infarction: Secondary | ICD-10-CM | POA: Diagnosis not present

## 2021-03-15 DIAGNOSIS — E663 Overweight: Secondary | ICD-10-CM

## 2021-03-15 DIAGNOSIS — I5022 Chronic systolic (congestive) heart failure: Secondary | ICD-10-CM | POA: Diagnosis not present

## 2021-03-15 DIAGNOSIS — E782 Mixed hyperlipidemia: Secondary | ICD-10-CM | POA: Diagnosis not present

## 2021-03-15 DIAGNOSIS — Z6827 Body mass index (BMI) 27.0-27.9, adult: Secondary | ICD-10-CM

## 2021-03-15 DIAGNOSIS — Z0000 Encounter for general adult medical examination without abnormal findings: Secondary | ICD-10-CM | POA: Diagnosis not present

## 2021-03-15 DIAGNOSIS — D500 Iron deficiency anemia secondary to blood loss (chronic): Secondary | ICD-10-CM | POA: Diagnosis not present

## 2021-03-15 DIAGNOSIS — N1831 Chronic kidney disease, stage 3a: Secondary | ICD-10-CM

## 2021-03-15 DIAGNOSIS — I10 Essential (primary) hypertension: Secondary | ICD-10-CM

## 2021-03-15 DIAGNOSIS — E119 Type 2 diabetes mellitus without complications: Secondary | ICD-10-CM

## 2021-03-15 NOTE — Progress Notes (Signed)
HPI:  Pt presents to the clinic today for her subsequent annual Medicare Wellness Exam. She is also due to follow up chronic conditions.  HLD with Aortic Atherosclerosis, CAD s/p MI: She denies angina. Her last LDL was 34, triglycerides 157, 02/2020. She denies myalgias on Rosuvastatin and Fenofibrate.  She follows with cardiology.  DM 2: Her last A1C was 6.2%, 10/2020. She is not taking any oral diabetic medication at this time. She does not check her sugars. She checks her feet routinely.  HTN: Her BP today is 129/62. She is taking Amlodipine, Losartan, Carvedilol and Spironolactone as prescribed. ECG from 12/2020 reviewed.  CHF, Systolic:She denies chronic cough or SOB. She is taking Carvedilol, Amlodipine, Losartan and Spironolactone as prescribed. Echo from 10/2019 reviewed.  History of Right Breast Cancer: s/p lumpectomy and radiation. She is taking Letrozole as prescribed. She gets mammograms yearly. She follows with cardiology.  Iron Deficiency Anemia: Her last H/H was 14.5/43.4, 10/2020. She is taking an oral iron supplement. She follows with hematology.  CKD 3: Her last creatinine was 1.07, GRF 53, 10/2020. She is on Losartan for renal protection. She follows with nephrology.  Osteoporosis: Bone density from 03/2020 reviewed. She is taking Fosamax as prescribed. She tries to get weight bearing exercise in daily.   Past Medical History:  Diagnosis Date   Allergy    CAD (coronary artery disease)    Chicken pox    Diabetes mellitus (HCC)    Heart murmur    HFrEF (heart failure with reduced ejection fraction) (HCC)    Hypertension     Current Outpatient Medications  Medication Sig Dispense Refill   acetaminophen (TYLENOL) 500 MG tablet Take 500-1,000 mg by mouth every 6 (six) hours as needed (for pain.).     alendronate (FOSAMAX) 70 MG tablet TAKE 1 TABLET (70 MG TOTAL) BY MOUTH ONCE A WEEK. TAKE WITH A FULL GLASS OF WATER ON AN EMPTY STOMACH. 12 tablet 3   amLODipine (NORVASC) 5  MG tablet Take 1 tablet (5 mg total) by mouth daily. 90 tablet 0   aspirin EC 81 MG tablet Take 1 tablet (81 mg total) by mouth daily. Swallow whole.     calcium carbonate (OS-CAL - DOSED IN MG OF ELEMENTAL CALCIUM) 1250 (500 Ca) MG tablet Take 500 mg by mouth daily.      carvedilol (COREG) 25 MG tablet TAKE 1 TABLET BY MOUTH TWICE A DAY 180 tablet 0   cyanocobalamin 2000 MCG tablet Take 2,000 mcg by mouth daily.     fenofibrate 54 MG tablet TAKE 1 TABLET BY MOUTH EVERY DAY 90 tablet 0   ferrous sulfate 325 (65 FE) MG tablet TAKE 1 TABLET BY MOUTH EVERY DAY 90 tablet 0   letrozole (FEMARA) 2.5 MG tablet TAKE 1 TABLET BY MOUTH EVERY DAY 90 tablet 3   losartan (COZAAR) 25 MG tablet TAKE 1 TABLET BY MOUTH EVERY DAY 90 tablet 3   Omega-3 Fatty Acids (FISH OIL) 500 MG CAPS Take 500 mg by mouth daily.     rosuvastatin (CRESTOR) 10 MG tablet TAKE 1 TABLET BY MOUTH EVERY DAY 90 tablet 1   spironolactone (ALDACTONE) 25 MG tablet TAKE 1 TABLET (25 MG TOTAL) BY MOUTH DAILY. 90 tablet 1   No current facility-administered medications for this visit.    No Known Allergies  Family History  Problem Relation Age of Onset   Heart failure Mother    Diabetes Mother    Hypertension Mother    Congestive Heart Failure Brother  Hypertension Brother    Cerebral palsy Son    Congestive Heart Failure Brother    Breast cancer Sister 32   Thyroid cancer Sister     Social History   Socioeconomic History   Marital status: Widowed    Spouse name: Not on file   Number of children: Not on file   Years of education: Not on file   Highest education level: Not on file  Occupational History   Not on file  Tobacco Use   Smoking status: Never   Smokeless tobacco: Never  Vaping Use   Vaping Use: Never used  Substance and Sexual Activity   Alcohol use: No    Alcohol/week: 0.0 standard drinks   Drug use: No   Sexual activity: Never  Other Topics Concern   Not on file  Social History Narrative   Not on  file   Social Determinants of Health   Financial Resource Strain: Not on file  Food Insecurity: Not on file  Transportation Needs: Not on file  Physical Activity: Not on file  Stress: Not on file  Social Connections: Not on file  Intimate Partner Violence: Not on file    Hospitiliaztions: None  Health Maintenance:     Flu: 05/2020             Tetanus: never             Pneumovax: never             Prevnar: never             Zostavax: never             Shingrix: never             Covid: Pfizer x 4             Mammogram: 12/2020             Pap Smear: more than 5 years ago             Bone Density: 03/2020             Colon Screening: never             Eye Doctor: as needed             Dental Exam: dentures              Providers             PCP: Webb Silversmith, NP:             Oncologist: Dr. Grayland Ormond             Cardiologist: Dr. Fletcher Anon             Hematology: Dr. Grayland Ormond                  I have personally reviewed and have noted:  1. The patient's medical and social history 2. Their use of alcohol, tobacco or illicit drugs 3. Their current medications and supplements 4. The patient's functional ability including ADL's, fall risks, home safety risks and  hearing or visual impairment. 5. Diet and physical activities 6. Evidence for depression or mood disorder  Subjective:   Review of Systems:   Constitutional: Denies fever, malaise, fatigue, headache or abrupt weight changes.  HEENT: Denies eye pain, eye redness, ear pain, ringing in the ears, wax buildup, runny nose, nasal congestion, bloody nose, or sore throat. Respiratory: Denies difficulty breathing, shortness of breath, cough or sputum production.  Cardiovascular: Denies chest pain, chest tightness, palpitations or swelling in the hands or feet.  Gastrointestinal: Denies abdominal pain, bloating, constipation, diarrhea or blood in the stool.  GU: Denies urgency, frequency, pain with urination, burning  sensation, blood in urine, odor or discharge. Musculoskeletal: Denies decrease in range of motion, difficulty with gait, muscle pain or joint pain and swelling.  Skin: Denies redness, rashes, lesions or ulcercations.  Neurological: Denies dizziness, difficulty with memory, difficulty with speech or problems with balance and coordination.  Psych: Denies anxiety, depression, SI/HI.  No other specific complaints in a complete review of systems (except as listed in HPI above).  Objective:  PE:   BP 129/62 (BP Location: Left Arm, Patient Position: Sitting, Cuff Size: Large)   Pulse 82   Temp (!) 97.5 F (36.4 C) (Temporal)   Resp 17   Ht '5\' 3"'$  (1.6 m)   Wt 154 lb 9.6 oz (70.1 kg)   SpO2 99%   BMI 27.39 kg/m   Wt Readings from Last 3 Encounters:  01/04/21 150 lb 2 oz (68.1 kg)  12/07/20 148 lb 6.4 oz (67.3 kg)  10/20/20 142 lb (64.4 kg)    General: Appears her stated age, overweight, in NAD. Skin: Warm, dry and intact. No ulcerations noted. HEENT: Head: normal shape and size; Eyes: sclera white and EOMs intact;  Neck: Neck supple, trachea midline. No masses, lumps or thyromegaly present.  Cardiovascular: Normal rate and rhythm. S1,S2 noted.  No murmur, rubs or gallops noted. No JVD or BLE edema.  Pulmonary/Chest: Normal effort and positive vesicular breath sounds. No respiratory distress. No wheezes, rales or ronchi noted.  Abdomen: Soft and nontender. Normal bowel sounds. No distention or masses noted. Liver, spleen and kidneys non palpable. Musculoskeletal: Strength 5/5 BUE/BLE. No signs of joint swelling.  Neurological: Alert and oriented. Cranial nerves II-XII grossly intact. Coordination normal.  Psychiatric: Mood and affect normal. Behavior is normal. Judgment and thought content normal.     BMET    Component Value Date/Time   NA 138 10/20/2020 1521   K 4.8 10/20/2020 1521   CL 103 10/20/2020 1521   CO2 19 (L) 10/20/2020 1521   GLUCOSE 111 (H) 10/20/2020 1521    GLUCOSE 111 (H) 07/14/2020 0857   BUN 19 10/20/2020 1521   CREATININE 1.07 (H) 10/20/2020 1521   CALCIUM 10.1 10/20/2020 1521   GFRNONAA 49 (L) 12/08/2019 1113   GFRAA 57 (L) 12/08/2019 1113    Lipid Panel     Component Value Date/Time   CHOL 112 03/09/2020 0949   CHOL 274 (H) 08/15/2015 0803   TRIG 157.0 (H) 03/09/2020 0949   HDL 47.40 03/09/2020 0949   HDL 38 (L) 08/15/2015 0803   CHOLHDL 2 03/09/2020 0949   VLDL 31.4 03/09/2020 0949   LDLCALC 34 03/09/2020 0949   LDLCALC 184 (H) 08/15/2015 0803    CBC    Component Value Date/Time   WBC 9.2 10/20/2020 1521   WBC 8.7 02/11/2020 1117   RBC 4.85 10/20/2020 1521   RBC 4.27 02/11/2020 1117   HGB 14.5 10/20/2020 1521   HCT 43.4 10/20/2020 1521   PLT 438 10/20/2020 1521   MCV 90 10/20/2020 1521   MCH 29.9 10/20/2020 1521   MCH 29.0 02/11/2020 1117   MCHC 33.4 10/20/2020 1521   MCHC 33.7 02/11/2020 1117   RDW 11.9 10/20/2020 1521   LYMPHSABS 2.5 10/20/2020 1521   MONOABS 0.6 12/08/2019 1113   EOSABS 0.2 10/20/2020 1521   BASOSABS 0.0 10/20/2020 1521  Hgb A1C Lab Results  Component Value Date   HGBA1C 6.2 (H) 10/20/2020      Assessment and Plan:   Medicare Annual Wellness Visit:  Diet: She does eat meat. She consumes fruits and veggies daily. She tries to avoid fried foods. She drinks mostly water, juice. Physical activity: Exercise bike Depression/mood screen: Negative, PHQ 9 score of 0 Hearing: Intact to whispered voice Visual acuity: Grossly normal, performs annual eye exam  ADLs: Capable Fall risk: None Home safety: Good Cognitive evaluation: Intact to orientation, naming, recall and repetition EOL planning: Adv directives, full code/ I agree  Preventative Medicine: Encouraged her to get her flu shot in the fall. She declines tetanus for financial reasons. Prevnar today, she will get pneumovax in 1 year. She declines Shingrix at this time. Covid vaccine UTD. Mammogram UTD. She declines pap smear or  colon cancer screening at this time. Bone density scheduled. Encouraged her to consume a balanced diet and exercise regimen. Advised her to see an eye doctor and dentist annually. Will check CBC, CMET, Lipid, A1C today. Due dates for screening exams given to patient as part of her AVS.   Next appointment: 6 months, follow up chronic conditions.   Webb Silversmith, NP This visit occurred during the SARS-CoV-2 public health emergency.  Safety protocols were in place, including screening questions prior to the visit, additional usage of staff PPE, and extensive cleaning of exam room while observing appropriate contact time as indicated for disinfecting solutions.

## 2021-03-16 LAB — COMPLETE METABOLIC PANEL WITH GFR
AG Ratio: 1.7 (calc) (ref 1.0–2.5)
ALT: 11 U/L (ref 6–29)
AST: 16 U/L (ref 10–35)
Albumin: 4.5 g/dL (ref 3.6–5.1)
Alkaline phosphatase (APISO): 44 U/L (ref 37–153)
BUN/Creatinine Ratio: 17 (calc) (ref 6–22)
BUN: 17 mg/dL (ref 7–25)
CO2: 25 mmol/L (ref 20–32)
Calcium: 10.3 mg/dL (ref 8.6–10.4)
Chloride: 105 mmol/L (ref 98–110)
Creat: 1 mg/dL — ABNORMAL HIGH (ref 0.60–0.95)
Globulin: 2.6 g/dL (calc) (ref 1.9–3.7)
Glucose, Bld: 125 mg/dL — ABNORMAL HIGH (ref 65–99)
Potassium: 4.4 mmol/L (ref 3.5–5.3)
Sodium: 140 mmol/L (ref 135–146)
Total Bilirubin: 0.5 mg/dL (ref 0.2–1.2)
Total Protein: 7.1 g/dL (ref 6.1–8.1)
eGFR: 57 mL/min/{1.73_m2} — ABNORMAL LOW (ref >=60)

## 2021-03-16 LAB — CBC
HCT: 41.4 % (ref 35.0–45.0)
Hemoglobin: 13.7 g/dL (ref 11.7–15.5)
MCH: 30.2 pg (ref 27.0–33.0)
MCHC: 33.1 g/dL (ref 32.0–36.0)
MCV: 91.2 fL (ref 80.0–100.0)
MPV: 9.4 fL (ref 7.5–12.5)
Platelets: 387 10*3/uL (ref 140–400)
RBC: 4.54 10*6/uL (ref 3.80–5.10)
RDW: 13.2 % (ref 11.0–15.0)
WBC: 9.3 10*3/uL (ref 3.8–10.8)

## 2021-03-16 LAB — LIPID PANEL
Cholesterol: 152 mg/dL (ref <200)
HDL: 58 mg/dL (ref >=50)
LDL Cholesterol (Calc): 70 mg/dL (calc)
Non-HDL Cholesterol (Calc): 94 mg/dL (calc) (ref <130)
Total CHOL/HDL Ratio: 2.6 (calc) (ref <5.0)
Triglycerides: 158 mg/dL — ABNORMAL HIGH (ref <150)

## 2021-03-16 LAB — HEMOGLOBIN A1C
Hgb A1c MFr Bld: 6.2 % of total Hgb — ABNORMAL HIGH (ref <5.7)
Mean Plasma Glucose: 131 mg/dL
eAG (mmol/L): 7.3 mmol/L

## 2021-03-18 ENCOUNTER — Encounter: Payer: Self-pay | Admitting: Internal Medicine

## 2021-03-18 DIAGNOSIS — M810 Age-related osteoporosis without current pathological fracture: Secondary | ICD-10-CM | POA: Insufficient documentation

## 2021-03-18 DIAGNOSIS — Z6827 Body mass index (BMI) 27.0-27.9, adult: Secondary | ICD-10-CM | POA: Insufficient documentation

## 2021-03-18 DIAGNOSIS — E663 Overweight: Secondary | ICD-10-CM | POA: Insufficient documentation

## 2021-03-18 DIAGNOSIS — I700 Atherosclerosis of aorta: Secondary | ICD-10-CM | POA: Insufficient documentation

## 2021-03-18 DIAGNOSIS — I2510 Atherosclerotic heart disease of native coronary artery without angina pectoris: Secondary | ICD-10-CM | POA: Insufficient documentation

## 2021-03-18 DIAGNOSIS — N183 Chronic kidney disease, stage 3 unspecified: Secondary | ICD-10-CM | POA: Insufficient documentation

## 2021-03-18 NOTE — Assessment & Plan Note (Signed)
CMET and lipid profile today Continue Amlodipine, Losartan, Carvedilol, Spironolactone, Rosuvastatin and Fenofibrate Encouraged her to consume a low fat diet

## 2021-03-18 NOTE — Assessment & Plan Note (Signed)
Continue Losartan for renal protection CMET today She will continue to follow with nephrology

## 2021-03-18 NOTE — Assessment & Plan Note (Signed)
CMET and lipid profile today Continue Rosuvastatin and Fenofibrate Encouraged her to consume a low fat diet

## 2021-03-18 NOTE — Assessment & Plan Note (Signed)
Encouraged diet and exercise for weight loss ?

## 2021-03-18 NOTE — Patient Instructions (Signed)

## 2021-03-18 NOTE — Assessment & Plan Note (Signed)
Continue Fosamax Encouraged daily weight bearing exercise

## 2021-03-18 NOTE — Assessment & Plan Note (Signed)
Continue Letrozole  She will continue yearly mammograms She will continue to follow with oncology

## 2021-03-18 NOTE — Assessment & Plan Note (Signed)
CBC today Continue oral iron 

## 2021-03-18 NOTE — Assessment & Plan Note (Signed)
A1C today No urine microalbumin secondary to ARB therapy Encouraged her to consume a low carb diet and exercise for weight loss No meds Encouraged routine eye exam Encouraged routine foot exams Encouraged her to get a flu shot in the fall Prevnar today, pneumovax in 1 year Covid vaccine UTD

## 2021-03-18 NOTE — Assessment & Plan Note (Signed)
Controlled on Amlodipine, Losartna, Carvedilol and Spironolactone CMET today Encouraged DASH diet and exercise for weight loss

## 2021-03-18 NOTE — Assessment & Plan Note (Signed)
Compensated CMET today Encouraged DASH diet and daily weights Continue Losartan, Amlodipine, Carvedilol and Spironolactone

## 2021-03-19 ENCOUNTER — Other Ambulatory Visit: Payer: Self-pay

## 2021-03-19 ENCOUNTER — Ambulatory Visit
Admission: RE | Admit: 2021-03-19 | Discharge: 2021-03-19 | Disposition: A | Discharge status: Home / Self Care | Payer: Medicare HMO | Source: Ambulatory Visit | Attending: Oncology | Admitting: Oncology

## 2021-03-19 DIAGNOSIS — E119 Type 2 diabetes mellitus without complications: Secondary | ICD-10-CM | POA: Diagnosis not present

## 2021-03-19 DIAGNOSIS — Z923 Personal history of irradiation: Secondary | ICD-10-CM | POA: Diagnosis not present

## 2021-03-19 DIAGNOSIS — M810 Age-related osteoporosis without current pathological fracture: Secondary | ICD-10-CM | POA: Diagnosis not present

## 2021-03-19 DIAGNOSIS — Z780 Asymptomatic menopausal state: Secondary | ICD-10-CM | POA: Insufficient documentation

## 2021-03-19 DIAGNOSIS — Z13820 Encounter for screening for osteoporosis: Secondary | ICD-10-CM | POA: Diagnosis not present

## 2021-03-19 DIAGNOSIS — Z853 Personal history of malignant neoplasm of breast: Secondary | ICD-10-CM | POA: Diagnosis not present

## 2021-03-19 DIAGNOSIS — C50511 Malignant neoplasm of lower-outer quadrant of right female breast: Secondary | ICD-10-CM

## 2021-03-28 ENCOUNTER — Encounter: Payer: Self-pay | Admitting: Radiation Oncology

## 2021-03-28 ENCOUNTER — Ambulatory Visit
Admission: RE | Admit: 2021-03-28 | Discharge: 2021-03-28 | Disposition: A | Discharge status: Home / Self Care | Payer: Medicare HMO | Source: Ambulatory Visit | Attending: Radiation Oncology | Admitting: Radiation Oncology

## 2021-03-28 DIAGNOSIS — C50511 Malignant neoplasm of lower-outer quadrant of right female breast: Secondary | ICD-10-CM

## 2021-03-28 DIAGNOSIS — Z170 Estrogen receptor positive status [ER+]: Secondary | ICD-10-CM | POA: Diagnosis not present

## 2021-03-28 DIAGNOSIS — Z79811 Long term (current) use of aromatase inhibitors: Secondary | ICD-10-CM | POA: Diagnosis not present

## 2021-03-28 DIAGNOSIS — Z923 Personal history of irradiation: Secondary | ICD-10-CM | POA: Diagnosis not present

## 2021-03-28 DIAGNOSIS — Z853 Personal history of malignant neoplasm of breast: Secondary | ICD-10-CM | POA: Diagnosis not present

## 2021-03-28 NOTE — Progress Notes (Signed)
Radiation Oncology Follow up Note  Name: Janice Liu   Date:   03/28/2021 MRN:  [id number] DOB: July 04, 1941    This 80 y.o. 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  No sentinel node was evaluated we did treat her axillary lymph nodes.  Seen today in routine follow-up she is doing well she specifically denies breast tenderness cough or bone pain..  She is currently on letrozole tolerating it well without side effect.  She had mammograms back in May which I have reviewed were BI-RADS 2 benign.  COMPLICATIONS OF TREATMENT: none  FOLLOW UP COMPLIANCE: keeps appointments   PHYSICAL EXAM:  BP (P) 132/65 (BP Location: Left Arm, Patient Position: Sitting)   Pulse (P) 80   Temp (!) (P) 96.9 F (36.1 C) (Tympanic)   Resp (P) 16   Wt (P) 155 lb 8 oz (70.5 kg)   BMI (P) 27.55 kg/m  Lungs are clear to A&P cardiac examination essentially unremarkable with regular rate and rhythm. No dominant mass or nodularity is noted in either breast in 2 positions examined. Incision is well-healed. No axillary or supraclavicular adenopathy is appreciated. Cosmetic result is excellent.  No swelling of the upper extremities is noted.  Well-developed well-nourished patient in NAD. HEENT reveals PERLA, EOMI, discs not visualized.  Oral cavity is clear. No oral mucosal lesions are identified. Neck is clear without evidence of cervical or supraclavicular adenopathy. Lungs are clear to A&P. Cardiac examination is essentially unremarkable with regular rate and rhythm without murmur rub or thrill. Abdomen is benign with no organomegaly or masses noted. Motor sensory and DTR  levels are equal and symmetric in the upper and lower extremities. Cranial nerves II through XII are grossly intact. Proprioception is intact. No peripheral adenopathy or edema is identified. No motor or sensory levels are noted. Crude visual fields are within normal range.  RADIOLOGY RESULTS: Mammograms reviewed compatible with above-stated findings PLAN: Present time patient is doing well with no evidence of disease 1 year out and pleased with her overall progress.  She continues on letrozole without side effect.  I have asked to see her back in 1 year for follow-up.  Patient knows to call with any concerns.  I would like to take this opportunity to thank you for allowing me to participate in the care of your patient.Noreene Filbert, MD

## 2021-03-29 DIAGNOSIS — E119 Type 2 diabetes mellitus without complications: Secondary | ICD-10-CM | POA: Diagnosis not present

## 2021-03-30 ENCOUNTER — Other Ambulatory Visit: Payer: Self-pay | Admitting: Internal Medicine

## 2021-03-30 NOTE — Telephone Encounter (Signed)
Requested medications are due for refill today EARLY  Requested medications are on the active medication list yes  Last refill 6/16 (90 day supply)  Last visit 8/4  Future visit scheduled 09/17/21  Notes to clinic Pt requesting too soon. Please assess.

## 2021-04-01 ENCOUNTER — Other Ambulatory Visit: Payer: Self-pay | Admitting: Internal Medicine

## 2021-04-01 NOTE — Telephone Encounter (Signed)
Requested Prescriptions  Pending Prescriptions Disp Refills  . fenofibrate 54 MG tablet [Pharmacy Med Name: FENOFIBRATE 54 MG TABLET] 90 tablet 1    Sig: TAKE 1 TABLET BY MOUTH EVERY DAY     Cardiovascular:  Antilipid - Fibric Acid Derivatives Failed - 04/01/2021  1:41 PM      Failed - Triglycerides in normal range and within 360 days    Triglycerides  Date Value Ref Range Status  03/15/2021 158 (H) <150 mg/dL Final         Failed - Cr in normal range and within 180 days    Creat  Date Value Ref Range Status  03/15/2021 1.00 (H) 0.60 - 0.95 mg/dL Final         Failed - eGFR in normal range and within 180 days    GFR calc Af Amer  Date Value Ref Range Status  12/08/2019 57 (L) >60 mL/min Final   GFR calc non Af Amer  Date Value Ref Range Status  12/08/2019 49 (L) >60 mL/min Final   GFR  Date Value Ref Range Status  07/14/2020 49.90 (L) >60.00 mL/min Final    Comment:    Calculated using the CKD-EPI Creatinine Equation (2021)   eGFR  Date Value Ref Range Status  03/15/2021 57 (L) > OR = 60 mL/min/1.35m Final    Comment:    The eGFR is based on the CKD-EPI 2021 equation. To calculate  the new eGFR from a previous Creatinine or Cystatin C result, go to https://www.kidney.org/professionals/ kdoqi/gfr%5Fcalculator   10/20/2020 53 (L) >59 mL/min/1.73 Final         Passed - Total Cholesterol in normal range and within 360 days    Cholesterol, Total  Date Value Ref Range Status  08/15/2015 274 (H) 100 - 199 mg/dL Final   Cholesterol  Date Value Ref Range Status  03/15/2021 152 <200 mg/dL Final         Passed - LDL in normal range and within 360 days    LDL Cholesterol (Calc)  Date Value Ref Range Status  03/15/2021 70 mg/dL (calc) Final    Comment:    Reference range: <100 . Desirable range <100 mg/dL for primary prevention;   <70 mg/dL for patients with CHD or diabetic patients  with > or = 2 CHD risk factors. .Marland KitchenLDL-C is now calculated using the  Martin-Hopkins  calculation, which is a validated novel method providing  better accuracy than the Friedewald equation in the  estimation of LDL-C.  MCresenciano Genreet al. JAnnamaria Helling [id number];419(62: [id number]  (http://education.[employer].com/faq/FAQ164)    Direct LDL  Date Value Ref Range Status  07/01/2019 87.0 mg/dL Final    Comment:    Optimal:  <100 mg/dLNear or Above Optimal:  100-129 mg/dLBorderline High:  130-159 mg/dLHigh:  160-189 mg/dLVery High:  >190 mg/dL         Passed - HDL in normal range and within 360 days    HDL  Date Value Ref Range Status  03/15/2021 58 > OR = 50 mg/dL Final  08/15/2015 38 (L) >39 mg/dL Final         Passed - ALT in normal range and within 180 days    ALT  Date Value Ref Range Status  03/15/2021 11 6 - 29 U/L Final         Passed - AST in normal range and within 180 days    AST  Date Value Ref Range Status  03/15/2021 16 10 - 35 U/L Final  Passed - Valid encounter within last 12 months    Recent Outpatient Visits          2 weeks ago Medicare annual wellness visit, subsequent   [facility] [place], Coralie Keens, NP      Future Appointments            In 5 months Ba[place], Coralie Keens, NP [facility], [facility]

## 2021-04-08 ENCOUNTER — Other Ambulatory Visit: Payer: Self-pay | Admitting: Cardiovascular Disease

## 2021-04-30 ENCOUNTER — Other Ambulatory Visit: Payer: Self-pay | Admitting: Internal Medicine

## 2021-04-30 DIAGNOSIS — E7800 Pure hypercholesterolemia, unspecified: Secondary | ICD-10-CM

## 2021-05-21 ENCOUNTER — Other Ambulatory Visit: Payer: Self-pay | Admitting: Internal Medicine

## 2021-05-21 NOTE — Telephone Encounter (Signed)
Requested Prescriptions  Pending Prescriptions Disp Refills  . carvedilol (COREG) 25 MG tablet [Pharmacy Med Name: CARVEDILOL 25 MG TABLET] 180 tablet 0    Sig: TAKE 1 TABLET BY MOUTH TWICE A DAY     Cardiovascular:  Beta Blockers Passed - 05/21/2021  1:31 AM      Passed - Last BP in normal range    BP Readings from Last 1 Encounters:  03/28/21 (P) 132/65         Passed - Last Heart Rate in normal range    Pulse Readings from Last 1 Encounters:  03/28/21 (P) 80         Passed - Valid encounter within last 6 months    Recent Outpatient Visits          2 months ago Medicare annual wellness visit, subsequent   [facility] [place], Coralie Keens, NP      Future Appointments            In 1 month Fletcher Anon, Mertie Clause, MD [place], [facility]   In 4 months [place], Coralie Keens, NP [facility], [facility]

## 2021-05-24 IMAGING — CT CT ABD-PELV W/ CM
2 of 5 series · 16 of 46 positions shown, 18 images · IV contrast (APPLIED)
Comparison: 07/18/2017

CLINICAL DATA: Blood in stool. Melena. Anemia.

EXAM:
CT ABDOMEN AND PELVIS WITH CONTRAST
TECHNIQUE: Multidetector CT imaging of the abdomen and pelvis was performed
using the standard protocol following bolus administration of
intravenous contrast.
CONTRAST:  80mL OMNIPAQUE IOHEXOL 300 MG/ML  SOLN

[Series 2: axial st · axial · 0.75mm/px · z∈[-454,-34]mm · 13 of 96 slices shown, 15 images]
[im 6/96  soft-tissue]
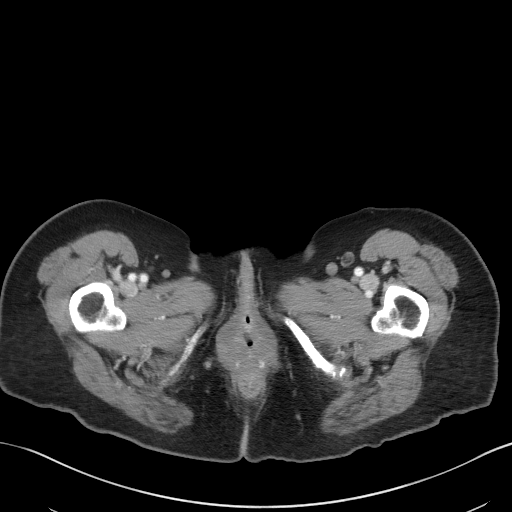
[im 6/96  bone]
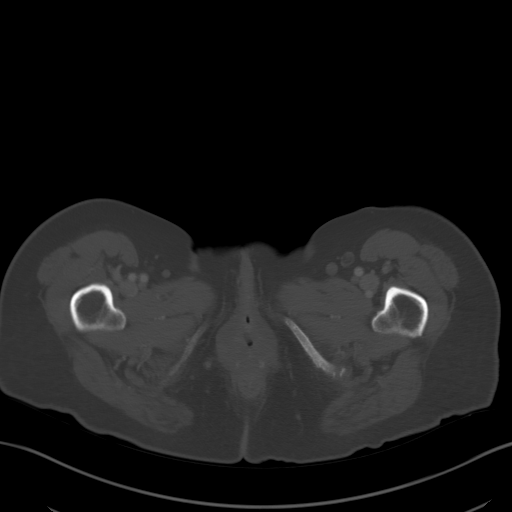
[im 11/96  soft-tissue]
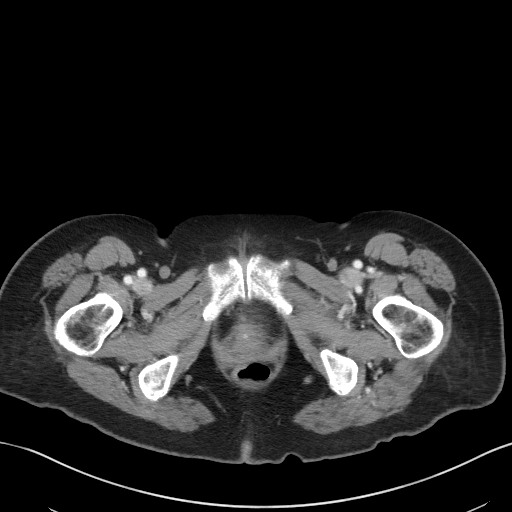
[im 22/96  soft-tissue]
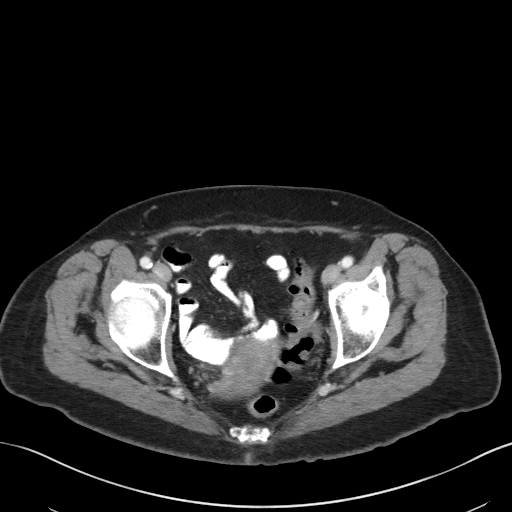
[im 27/96  soft-tissue]
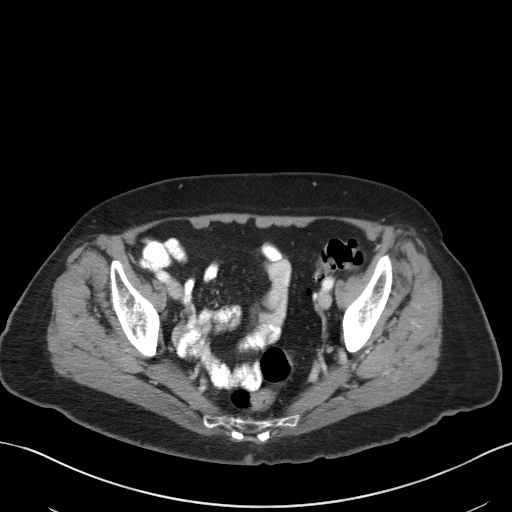
[im 32/96  soft-tissue]
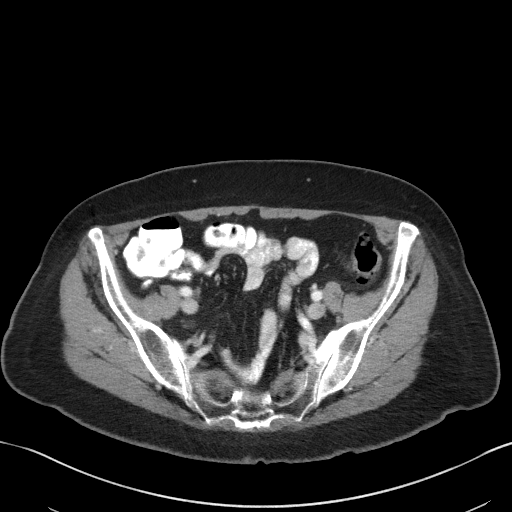
[im 43/96  soft-tissue]
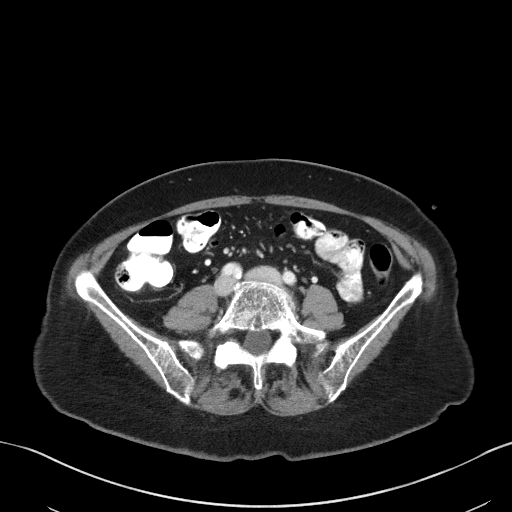
[im 48/96  soft-tissue]
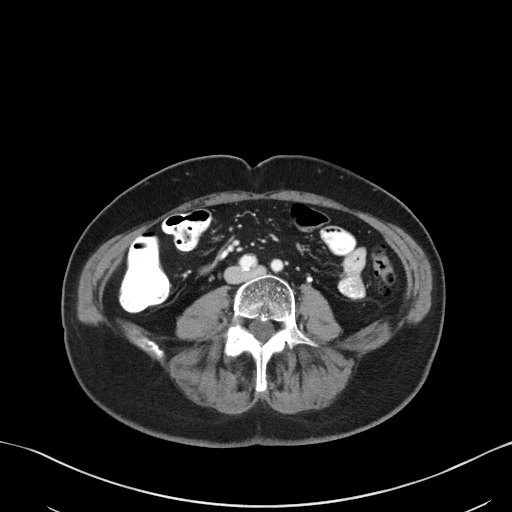
[im 53/96  soft-tissue]
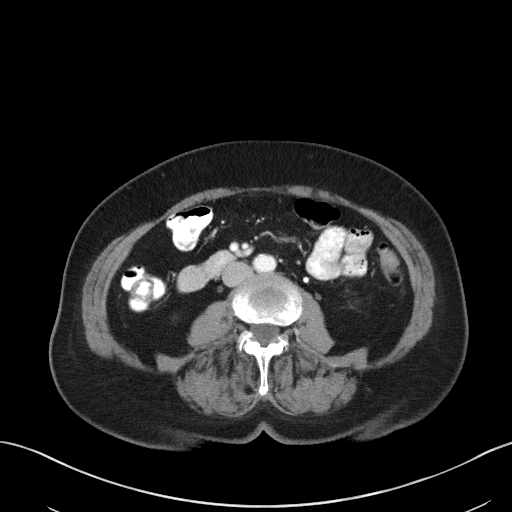
[im 64/96  soft-tissue]
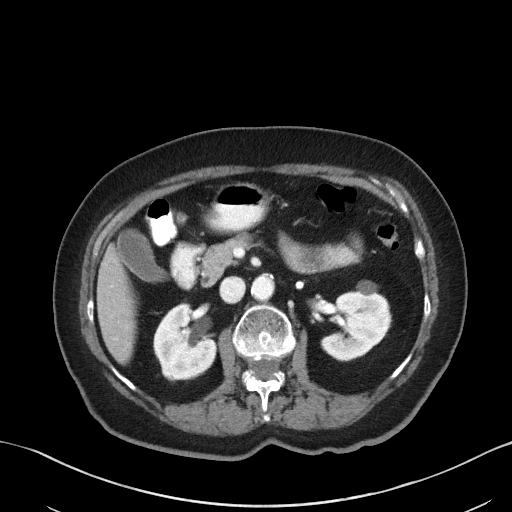
[im 64/96  bone]
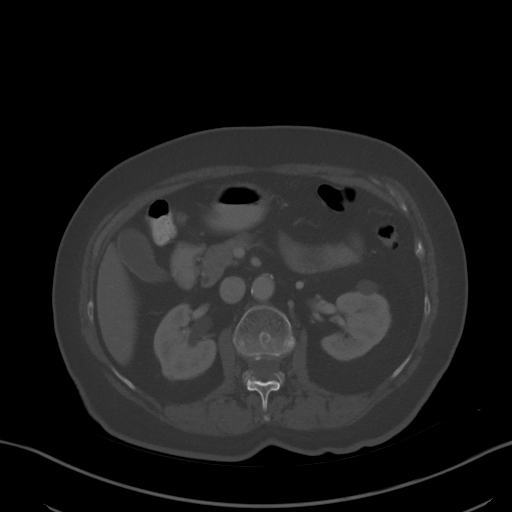
[im 69/96  soft-tissue]
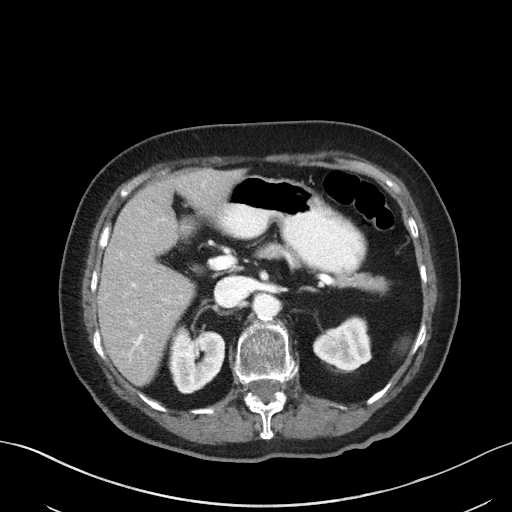
[im 74/96  soft-tissue]
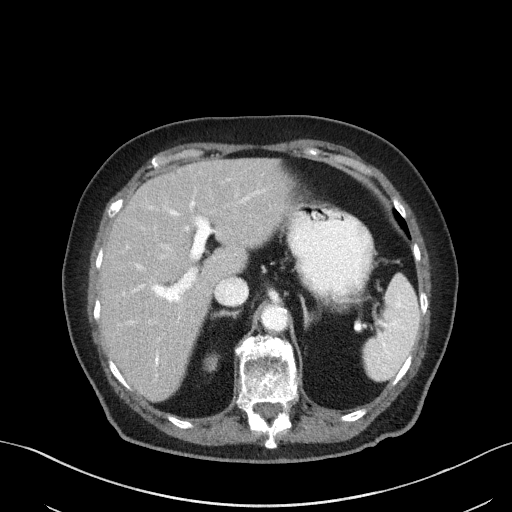
[im 85/96  soft-tissue]
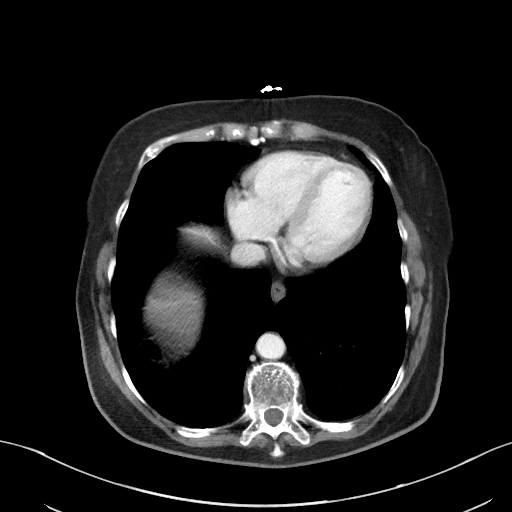
[im 90/96  soft-tissue]
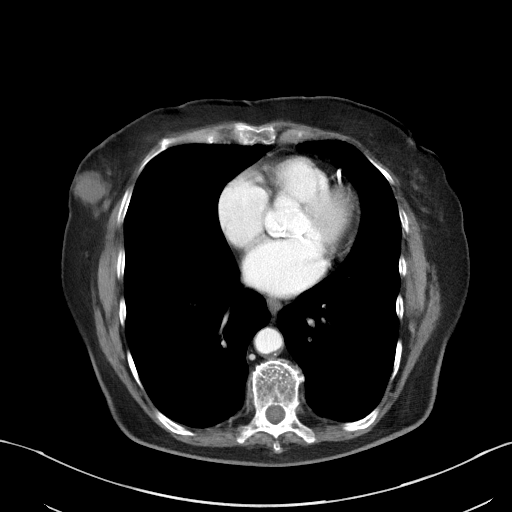

[Series 5: coronal st · coronal · 0.76mm/px · 3 of 88 slices shown]
[im 30/88  soft-tissue]
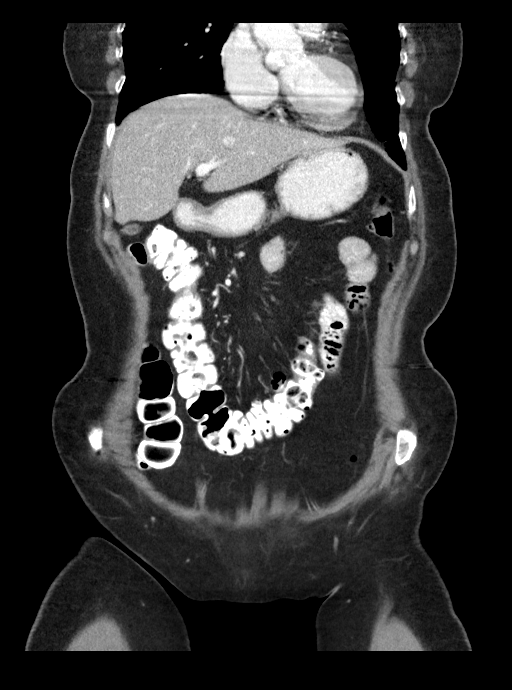
[im 39/88  soft-tissue]
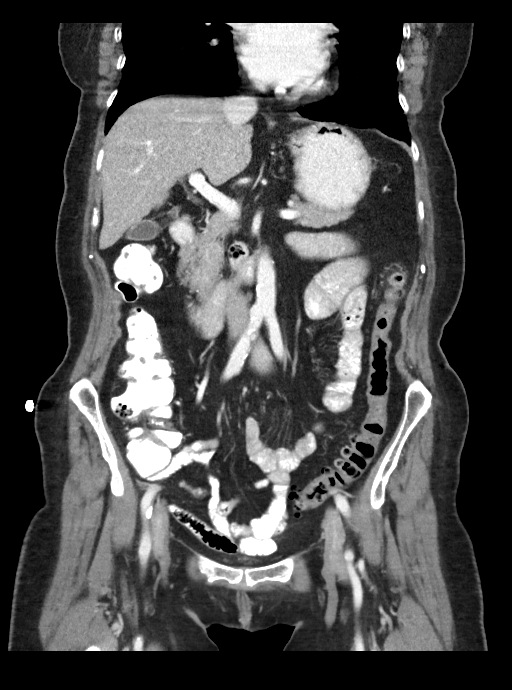
[im 49/88  soft-tissue]
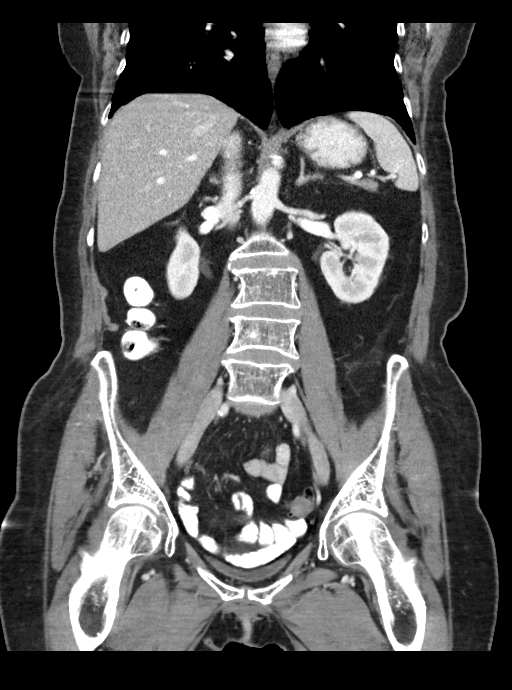

[16 of 46 positions shown; findings below may reference images not displayed]

FINDINGS: Lower Chest: Lung bases are clear. A 2.3 cm mass is seen in the
lower outer quadrant of the right breast, highly suspicious for
primary breast carcinoma.

Hepatobiliary: No hepatic masses identified. Gallbladder is
unremarkable. No evidence of biliary ductal dilatation.

Pancreas:  No mass or inflammatory changes.

Spleen: Within normal limits in size and appearance.

Adrenals/Urinary Tract: No masses identified. Small subcapsular left
renal cyst again noted. No evidence of ureteral calculi or
hydronephrosis.

Stomach/Bowel: No evidence of obstruction, inflammatory process or
abnormal fluid collections. Normal appendix visualized.
Diverticulosis is seen mainly involving the descending and sigmoid
colon, however there is no evidence of diverticulitis.

Vascular/Lymphatic: No pathologically enlarged lymph nodes. No
abdominal aortic aneurysm. Aortic atherosclerosis incidentally
noted.

Reproductive:  No mass or other significant abnormality.

Other:  None.

Musculoskeletal:  No suspicious bone lesions identified.
IMPRESSION: 1. No acute findings within the abdomen or pelvis.
2. Colonic diverticulosis, without radiographic evidence of
diverticulitis.
3. 2.3 cm mass in lower outer quadrant of right breast, highly
suspicious for primary breast carcinoma. Recommend further
evaluation with dedicated breast imaging. No evidence of metastatic
disease within the abdomen or pelvis.

## 2021-05-29 ENCOUNTER — Telehealth: Payer: Self-pay

## 2021-05-29 NOTE — Telephone Encounter (Signed)
Copied from [place] [phone]. Topic: General - Other >> May 29, 2021  2:57 PM Valere Dross wrote: Reason for CRM: Pts son called in stating pt got a letter in the mail stating she is no longer in the pts network but it came from [place], pt wants to know is she still going to be taking her [place] Medicare here at [facility], please advise.

## 2021-05-31 NOTE — Telephone Encounter (Signed)
Called patient and was unable to leave a message.  [facility] credentialing is working on issues with [employer].  Yes, Rollene Fare is still in network with [employer].

## 2021-06-07 NOTE — Progress Notes (Signed)
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  INTERVAL HISTORY: Patient returns to clinic today for routine 47-monthevaluation and discussion of her bone mineral density results.  She continues to tolerate letrozole and Fosamax well without significant side effects.  She currently feels well and is asymptomatic. She has no neurologic complaints.  She denies any recent fevers or illnesses.  She has a good appetite and denies weight loss. She has no chest pain, shortness of breath, cough, or hemoptysis.  She denies any nausea, vomiting, constipation, or diarrhea.  She has no melena or hematochezia.  She has no urinary complaints.  Patient feels at her baseline offers no specific complaints today.  REVIEW OF SYSTEMS:   Review of Systems  Constitutional: Negative.  Negative for fever, malaise/fatigue and weight loss.  Respiratory: Negative.  Negative for cough and shortness of breath.   Cardiovascular: Negative.  Negative for chest pain and leg swelling.  Gastrointestinal: Negative.  Negative for abdominal pain, blood in stool and melena.  Genitourinary: Negative.  Negative for hematuria.  Musculoskeletal: Negative.  Negative for back pain.  Skin: Negative.  Negative for rash.  Neurological: Negative.  Negative for dizziness, focal weakness, weakness and headaches.  Psychiatric/Behavioral: Negative.  The patient is not  nervous/anxious.    As per HPI. Otherwise, a complete review of systems is negative.  PAST MEDICAL HISTORY: Past Medical History:  Diagnosis Date   Allergy    CAD (coronary artery disease)    Chicken pox    Diabetes mellitus ([place]    Heart murmur    HFrEF (heart failure with reduced ejection fraction) ([place]    Hypertension     PAST SURGICAL HISTORY: Past Surgical History:  Procedure Laterality Date   BREAST BIOPSY Right 11/04/2019   Affirm Bx- path pending- Ribbon Clip   BREAST BIOPSY Left 11/04/2019   Affirm Bx- path pending- Coil Clip   BREAST BIOPSY Left 11/12/2019   uKoreabx/ ribbon clip/ path pending   BREAST CYST ASPIRATION Left 11/12/2019   2 areas   BREAST LUMPECTOMY Right 12/10/2019   Procedure: BREAST LUMPECTOMY;  Surgeon: RRonny Bacon MD;  [facility];  Service: General;  Laterality: Right;   CARDIAC CATHETERIZATION     CORONARY STENT INTERVENTION N/A 08/09/2019   Procedure: CORONARY STENT INTERVENTION;  Surgeon: ENelva Bush MD;  [facility];  Service: Cardiovascular;  Laterality: N/A;   LEFT HEART CATH AND CORONARY ANGIOGRAPHY N/A 08/09/2019   Procedure: LEFT HEART CATH AND CORONARY ANGIOGRAPHY;  Surgeon: ENelva Bush MD;  [facility];  Service: Cardiovascular;  Laterality: N/A;    FAMILY HISTORY: Family History  Problem Relation Age of Onset   Heart failure Mother    Diabetes Mother    Hypertension Mother    Congestive Heart Failure Brother    Hypertension Brother    Cerebral palsy Son    Congestive Heart Failure Brother    Breast cancer Sister 790  Thyroid cancer Sister  ADVANCED DIRECTIVES (Y/N):  N  HEALTH MAINTENANCE: Social History   Tobacco Use   Smoking status: Never   Smokeless tobacco: Never  Vaping Use   Vaping Use: Never used  Substance Use Topics   Alcohol use: No    Alcohol/week: 0.0 standard drinks   Drug use: No     Colonoscopy:  PAP:  Bone density:  Lipid  panel:  No Known Allergies  Current Outpatient Medications  Medication Sig Dispense Refill   acetaminophen (TYLENOL) 500 MG tablet Take 500-1,000 mg by mouth every 6 (six) hours as needed (for pain.).     alendronate (FOSAMAX) 70 MG tablet TAKE 1 TABLET (70 MG TOTAL) BY MOUTH ONCE A WEEK. TAKE WITH A FULL GLASS OF WATER ON AN EMPTY STOMACH. 12 tablet 3   amLODipine (NORVASC) 5 MG tablet Take 1 tablet (5 mg total) by mouth daily. 90 tablet 0   aspirin EC 81 MG tablet Take 1 tablet (81 mg total) by mouth daily. Swallow whole.     calcium carbonate (OS-CAL - DOSED IN MG OF ELEMENTAL CALCIUM) 1250 (500 Ca) MG tablet Take 500 mg by mouth daily.      carvedilol (COREG) 25 MG tablet TAKE 1 TABLET BY MOUTH TWICE A DAY 180 tablet 0   cyanocobalamin 2000 MCG tablet Take 2,000 mcg by mouth daily.     fenofibrate 54 MG tablet TAKE 1 TABLET BY MOUTH EVERY DAY 90 tablet 1   ferrous sulfate 325 (65 FE) MG tablet TAKE 1 TABLET BY MOUTH EVERY DAY 90 tablet 0   letrozole (FEMARA) 2.5 MG tablet TAKE 1 TABLET BY MOUTH EVERY DAY 90 tablet 3   losartan (COZAAR) 25 MG tablet TAKE 1 TABLET BY MOUTH EVERY DAY 90 tablet 3   Omega-3 Fatty Acids (FISH OIL) 500 MG CAPS Take 500 mg by mouth daily.     rosuvastatin (CRESTOR) 10 MG tablet TAKE 1 TABLET BY MOUTH EVERY DAY 90 tablet 0   spironolactone (ALDACTONE) 25 MG tablet TAKE 1 TABLET (25 MG TOTAL) BY MOUTH DAILY. 90 tablet 1   PFIZER-BIONT COVID-19 VAC-TRIS SUSP injection  (Patient not taking: No sig reported)     No current facility-administered medications for this visit.    OBJECTIVE: Vitals:   06/12/21 1108  BP: 118/79  Pulse: 81  Resp: 16  Temp: (!) 97.2 F (36.2 C)  SpO2: 96%     Body mass index is 27.71 kg/m.    ECOG FS:0 - Asymptomatic  General: Well-developed, well-nourished, no acute distress. Eyes: Pink conjunctiva, anicteric sclera. HEENT: Normocephalic, moist mucous membranes. Breast: Exam deferred today. Lungs: No audible wheezing or  coughing. Heart: Regular rate and rhythm. Abdomen: Soft, nontender, no obvious distention. Musculoskeletal: No edema, cyanosis, or clubbing. Neuro: Alert, answering all questions appropriately. Cranial nerves grossly intact. Skin: No rashes or petechiae noted. Psych: Normal affect.  LAB RESULTS:  Lab Results  Component Value Date   NA 140 03/15/2021   K 4.4 03/15/2021   CL 105 03/15/2021   CO2 25 03/15/2021   GLUCOSE 125 (H) 03/15/2021   BUN 17 03/15/2021   CREATININE 1.00 (H) 03/15/2021   CALCIUM 10.3 03/15/2021   PROT 7.1 03/15/2021   ALBUMIN 4.5 03/09/2020   AST 16 03/15/2021   ALT 11 03/15/2021   ALKPHOS 63 03/09/2020   BILITOT 0.5 03/15/2021   GFRNONAA 49 (L) 12/08/2019   GFRAA 57 (L) 12/08/2019    Lab Results  Component Value Date   WBC 9.3 03/15/2021   NEUTROABS  5.8 10/20/2020   HGB 13.7 03/15/2021   HCT 41.4 03/15/2021   MCV 91.2 03/15/2021   PLT 387 03/15/2021   Lab Results  Component Value Date   IRON 74 10/20/2020   TIBC 363 10/20/2020   IRONPCTSAT 20 10/20/2020   Lab Results  Component Value Date   FERRITIN 147 10/20/2020     STUDIES: No results found.  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  Given the fact that she is grade 1 and mucinous, Oncotype DX was not necessary.  Patient completed adjuvant XRT in July 2021.  Continue letrozole for a total of 5 years completing treatment in July 2026.  Patient's most recent mammogram on Dec 13, 2020 was reported as BI-RADS 2.  Return to clinic in May 2023 1 to 2 days after her next scheduled mammogram for routine evaluation. 2.  Osteoporosis: Patient's most recent bone mineral density on March 19, 2021  reported a T score of -2.9.  This is mildly improved from 1 year prior where her T score was reported -3.1.  Continue Fosamax, calcium, and vitamin D supplementation.  Repeat in August 2023.   3.  Iron deficiency anemia: Resolved.  Patient's most recent hemoglobin was 13.7.  She last received IV Venofer on November 22, 2019.   Patient expressed understanding and was in agreement with this plan. She also understands that She can call clinic at any time with any questions, concerns, or complaints.   Cancer Staging Carcinoma of lower-outer quadrant of female breast, right [facility]) Staging form: Breast, AJCC 8th Edition - Clinical stage from 01/07/2020: Stage IB (cT2, cN0, cM0, G1, ER+, PR+, HER2-) - Signed by Lloyd Huger, MD on 01/07/2020 Stage prefix: Initial diagnosis Histologic grading system: 3 grade system   Lloyd Huger, MD   06/12/2021 12:26 PM

## 2021-06-12 ENCOUNTER — Other Ambulatory Visit: Payer: Self-pay

## 2021-06-12 ENCOUNTER — Inpatient Hospital Stay: Payer: Medicare HMO | Attending: Oncology | Admitting: Oncology

## 2021-06-12 ENCOUNTER — Other Ambulatory Visit: Payer: Self-pay | Admitting: Emergency Medicine

## 2021-06-12 VITALS — BP 118/79 | HR 81 | Temp 97.2°F | Resp 16 | Wt 156.4 lb

## 2021-06-12 DIAGNOSIS — I5022 Chronic systolic (congestive) heart failure: Secondary | ICD-10-CM | POA: Insufficient documentation

## 2021-06-12 DIAGNOSIS — Z170 Estrogen receptor positive status [ER+]: Secondary | ICD-10-CM | POA: Diagnosis not present

## 2021-06-12 DIAGNOSIS — Z803 Family history of malignant neoplasm of breast: Secondary | ICD-10-CM | POA: Insufficient documentation

## 2021-06-12 DIAGNOSIS — E119 Type 2 diabetes mellitus without complications: Secondary | ICD-10-CM | POA: Insufficient documentation

## 2021-06-12 DIAGNOSIS — I110 Hypertensive heart disease with heart failure: Secondary | ICD-10-CM | POA: Insufficient documentation

## 2021-06-12 DIAGNOSIS — C50511 Malignant neoplasm of lower-outer quadrant of right female breast: Secondary | ICD-10-CM | POA: Diagnosis not present

## 2021-06-12 NOTE — Progress Notes (Signed)
Pt has no concerns at this time. 

## 2021-06-13 IMAGING — MG US  BREAST BX W/ LOC DEV 1ST LESION IMG BX SPEC US GUIDE*R*
1 series · 8 of 8 positions shown · non-contrast
Comparison: Previous exam(s).
COMPARISON: Previous exam(s).

Addendum:
CLINICAL DATA: 78-year-old female for tissue sampling of 2.8 cm
RIGHT breast mass.

EXAM:
ULTRASOUND GUIDED RIGHT BREAST CORE NEEDLE BIOPSY

[Series 1: MG view · 0.06mm/px · 8 of 8 slices shown]
[im 1/8]
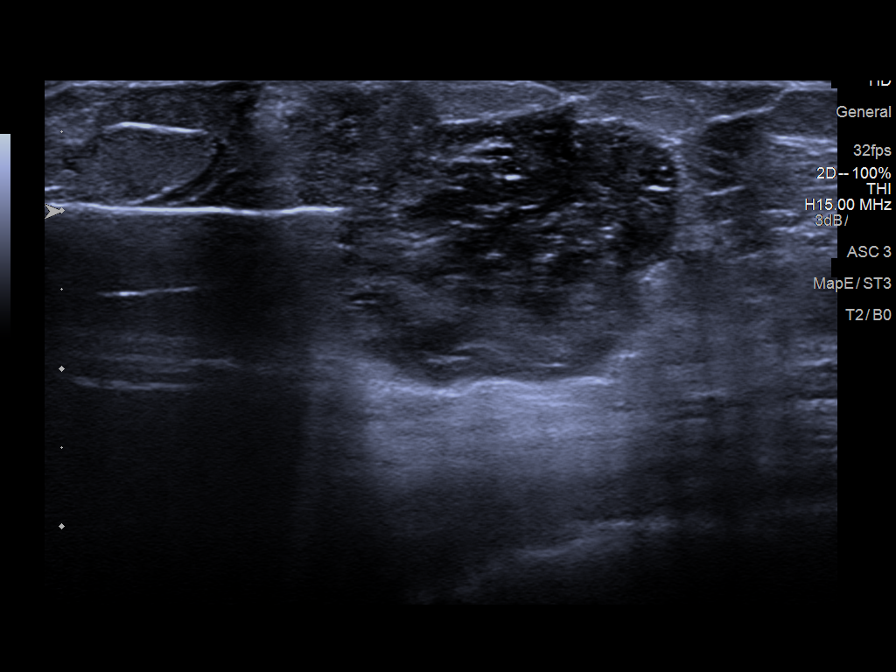
[im 2/8]
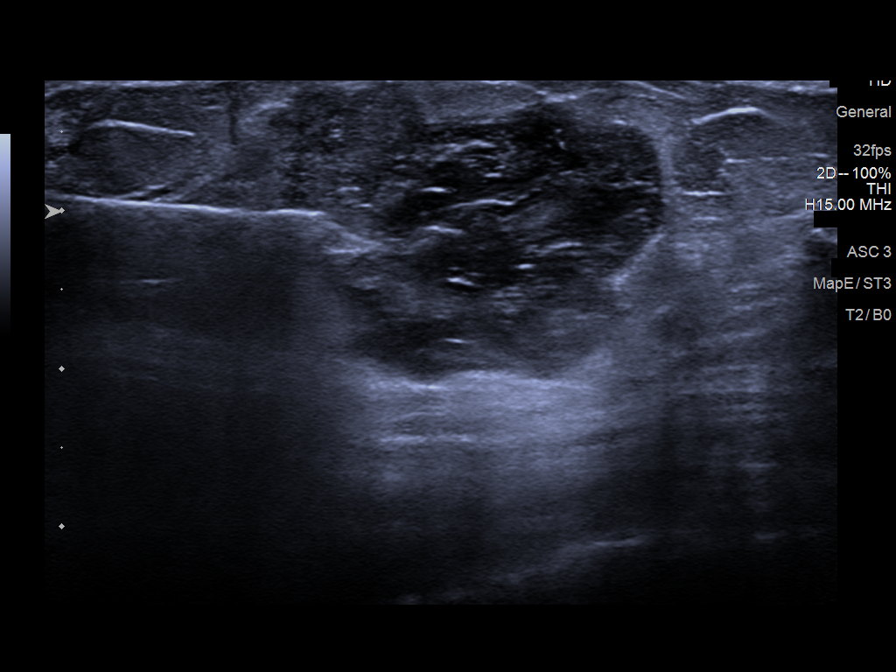
[im 3/8]
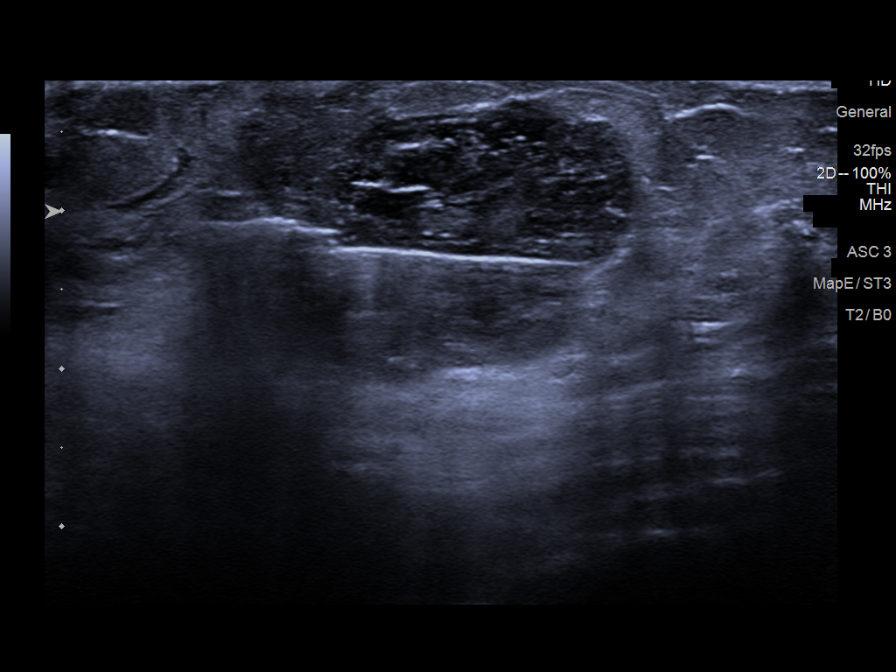
[im 4/8]
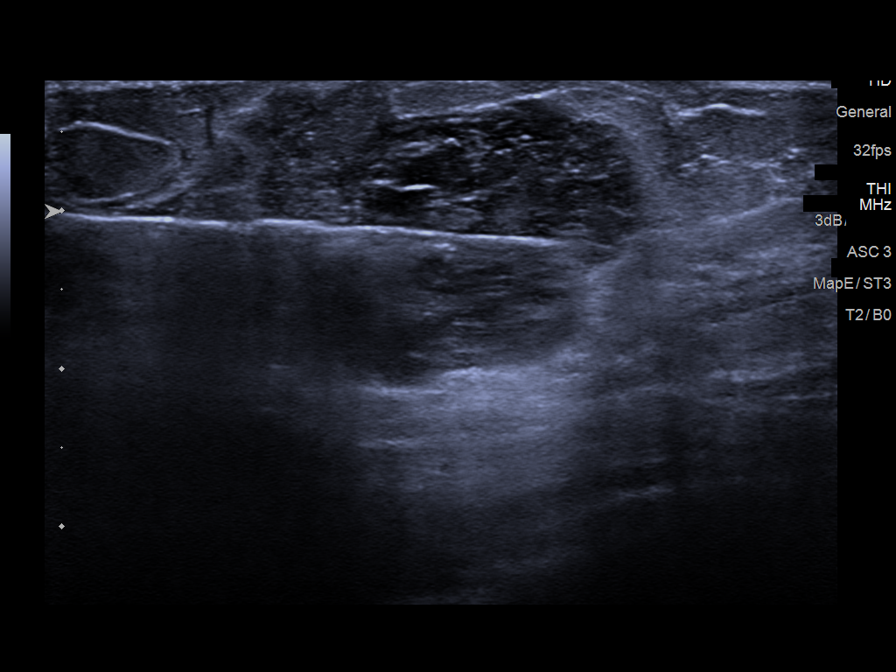
[im 5/8]
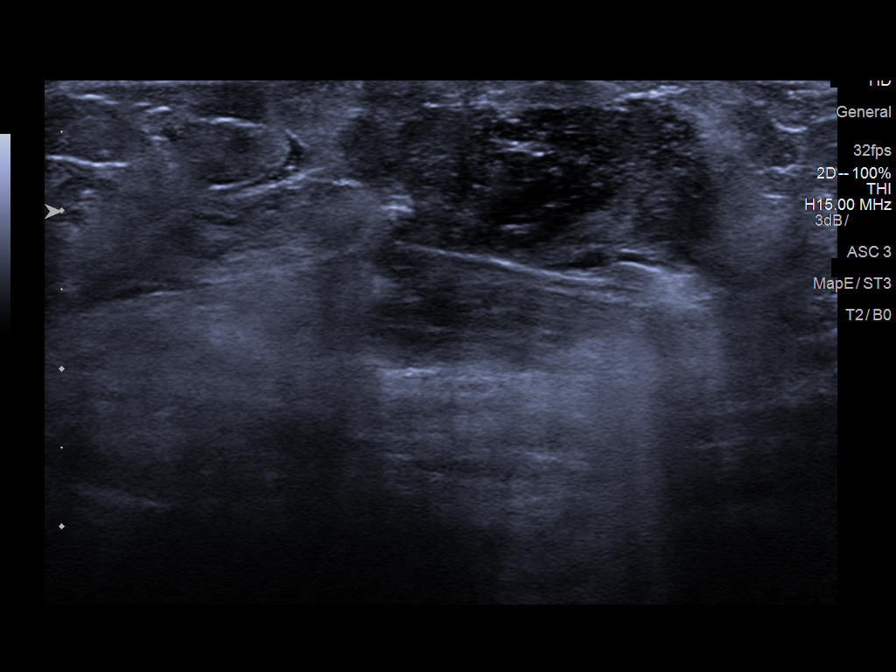
[im 6/8]
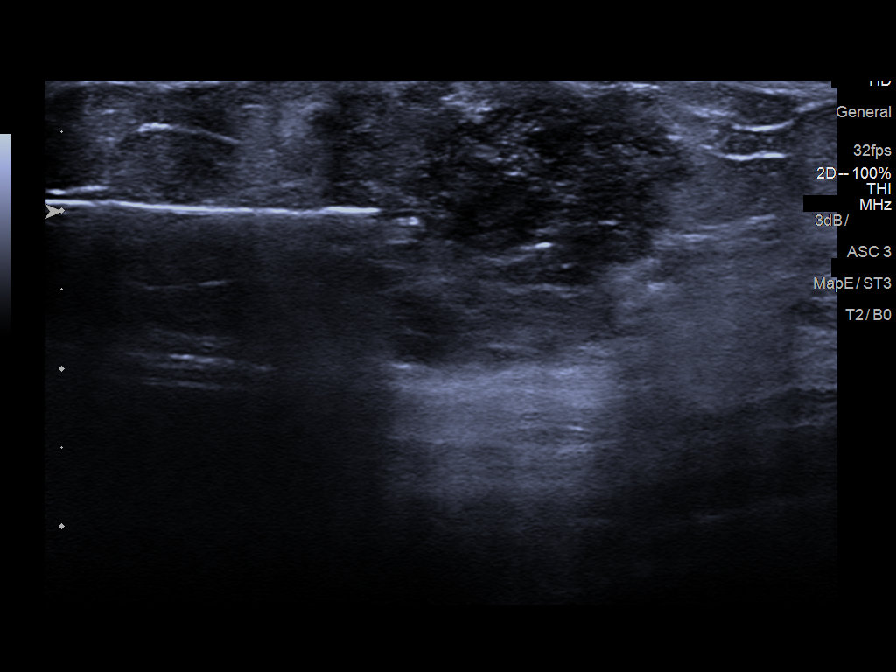
[im 7/8]
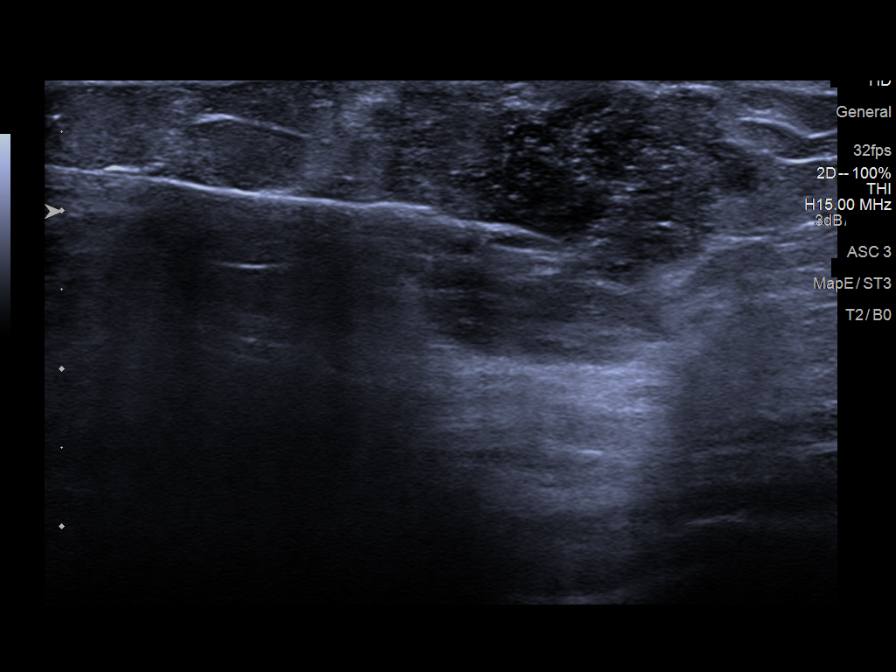
[im 8/8]
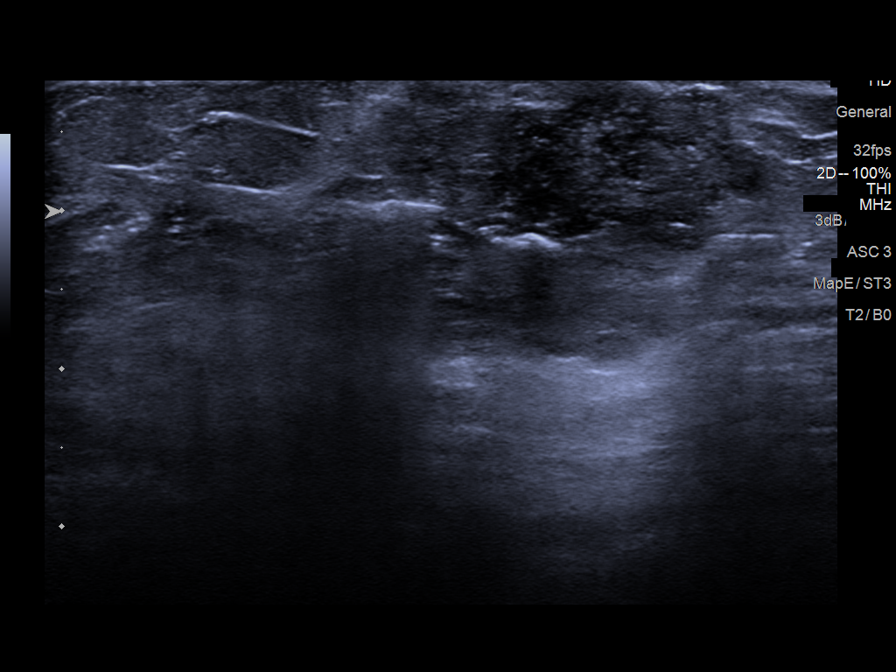

[8 of 8 positions shown; findings below may reference images not displayed]



Lesion quadrant: LOWER OUTER RIGHT breast:

Using sterile technique and 1% Lidocaine as local anesthetic, under
direct ultrasound visualization, a 14 gauge Claudette device was
used to perform biopsy of the 2.8 cm mass at the 8 o'clock position
of the RIGHT breast using a LATERAL approach. At the conclusion of
the procedure a HEART shaped tissue marker clip was deployed into
the biopsy cavity. Follow up 2 view mammogram was performed and
dictated separately.
IMPRESSION: Ultrasound guided biopsy of 2.8 cm LOWER OUTER RIGHT breast mass.
No apparent complications.

ADDENDUM:
PATHOLOGY revealed:

A. BREAST, RIGHT UPPER OUTER QUADRANT; STEREOTACTIC CORE NEEDLE
BIOPSY: - BENIGN MAMMARY PARENCHYMA WITH INTRADUCTAL PAPILLOMA AND
FIBROCYSTIC CHANGES, WITH ASSOCIATED CALCIFICATIONS. - NEGATIVE FOR
ATYPICAL PROLIFERATIVE BREAST DISEASE.

B. BREAST, LEFT UPPER OUTER QUADRANT; STEREOTACTIC CORE NEEDLE
BIOPSY: - FIBROADENOMA, WITH ASSOCIATED CALCIFICATIONS. - BACKGROUND
BENIGN MAMMARY PARENCHYMA WITH FIBROCYSTIC CHANGES. - NEGATIVE FOR
ATYPICAL PROLIFERATIVE BREAST DISEASE.

C. BREAST, RIGHT LOWER OUTER QUADRANT; ULTRASOUND-GUIDED CORE NEEDLE
BIOPSY: - INVASIVE MAMMARY CARCINOMA, WITH MUCINOUS FEATURES. 5mm in
this sample. Grade 1. Ductal carcinoma in situ: Not identified.
Lymphovascular invasion: Not identified.

Pathology results are CONCORDANT with imaging findings, per Dr. Neel
Festini.

Pathology results and recommendations below were discussed with
patient and her son (Davida Tiger) by telephone on 11/05/2019.
Patient reported biopsy site doing well with slight tenderness at
the site. Post biopsy care instructions were reviewed and questions
were answered. Patient was instructed to call [HOSPITAL]
if any concerns or questions arise related to the biopsy.

Recommendation: Surgical referral. Patient has three additional
areas in LEFT breast scheduled for biopsy this [REDACTED] [DATE] at
[HOSPITAL]. Please schedule surgical consultation for
after these last three biopsies are resulted. Request for surgical
referral was relayed to Kaki Jim RN and Thanh Transport RN at
[HOSPITAL] [HOSPITAL] by Mengs Jenzer RN on 11/08/2019. Apestegui
Koo RN stated she will coordinate above request for timing
results with surgical consultation.

Addendum by Mengs Jenzer RN on 11/08/2019.



Lesion quadrant: LOWER OUTER RIGHT breast:

Using sterile technique and 1% Lidocaine as local anesthetic, under
direct ultrasound visualization, a 14 gauge Claudette device was
used to perform biopsy of the 2.8 cm mass at the 8 o'clock position
of the RIGHT breast using a LATERAL approach. At the conclusion of
the procedure a HEART shaped tissue marker clip was deployed into
the biopsy cavity. Follow up 2 view mammogram was performed and
dictated separately.
IMPRESSION: Ultrasound guided biopsy of 2.8 cm LOWER OUTER RIGHT breast mass.
No apparent complications.

## 2021-07-03 ENCOUNTER — Other Ambulatory Visit: Payer: Self-pay

## 2021-07-03 ENCOUNTER — Ambulatory Visit: Payer: Medicare HMO | Admitting: Cardiovascular Disease

## 2021-07-03 ENCOUNTER — Encounter: Payer: Self-pay | Admitting: Cardiovascular Disease

## 2021-07-03 VITALS — BP 124/70 | HR 82 | Ht 63.0 in | Wt 157.1 lb

## 2021-07-03 DIAGNOSIS — E785 Hyperlipidemia, unspecified: Secondary | ICD-10-CM

## 2021-07-03 DIAGNOSIS — I5022 Chronic systolic (congestive) heart failure: Secondary | ICD-10-CM

## 2021-07-03 DIAGNOSIS — I10 Essential (primary) hypertension: Secondary | ICD-10-CM | POA: Diagnosis not present

## 2021-07-03 DIAGNOSIS — I2510 Atherosclerotic heart disease of native coronary artery without angina pectoris: Secondary | ICD-10-CM | POA: Diagnosis not present

## 2021-07-03 DIAGNOSIS — C50511 Malignant neoplasm of lower-outer quadrant of right female breast: Secondary | ICD-10-CM

## 2021-07-03 MED ORDER — LOSARTAN POTASSIUM 100 MG PO TABS: 100.0000 mg | ORAL_TABLET | Freq: Every day | ORAL | 5 refills | Status: DC

## 2021-07-03 NOTE — Progress Notes (Signed)
Cardiology Office Note   Date:  07/03/2021   ID:  Janice Liu, DOB 03-10-1941, MRN [id number]  PCP:  Jearld Fenton, NP  Cardiologist:   Kathlyn Sacramento, MD   Chief Complaint  Patient presents with   Other    6 Month f/u pt would like to discuss if she should be taking potassium. Meds reviewed verbally with pt.      History of Present Illness: Janice Liu is a 80 y.o. female who presents for a follow-up visit regarding coronary artery disease and chronic systolic heart failure.  She has chronic medical conditions that include essential hypertension, hyperlipidemia and type 2 diabetes. She presented in December 2020 with non-ST elevation myocardial infarction.  Peak troponin was greater than 27,000.  Cardiac catheterization showed severe one-vessel coronary artery disease with 90% ulcerative stenosis in the mid LAD and moderate proximal LAD and distal RCA disease.  Ejection fraction was 40 to 45% with mildly elevated left ventricular end-diastolic pressure.  She underwent successful angioplasty and drug-eluting stent placement to the mid LAD.  She had a follow-up echocardiogram in March, 2021 that showed improvement in ejection fraction to 50%.  She underwent right lumpectomy in April 2021 for breast cancer.  She has been doing reasonably well with no recent chest pain, shortness of breath or palpitations.  She continues to have mild bilateral leg edema.  She takes her medications regularly.  Past Medical History:  Diagnosis Date   Allergy    CAD (coronary artery disease)    Chicken pox    Diabetes mellitus ([place])    Heart murmur    HFrEF (heart failure with reduced ejection fraction) ([place])    Hypertension     Past Surgical History:  Procedure Laterality Date   BREAST BIOPSY Right 11/04/2019   Affirm Bx- path pending- Ribbon Clip   BREAST BIOPSY Left 11/04/2019   Affirm Bx- path pending- Coil Clip   BREAST BIOPSY Left 11/12/2019   [place] bx/ ribbon clip/ path pending    BREAST CYST ASPIRATION Left 11/12/2019   2 areas   BREAST LUMPECTOMY Right 12/10/2019   Procedure: BREAST LUMPECTOMY;  Surgeon: Ronny Bacon, MD;  Location: [facility];  Service: General;  Laterality: Right;   CARDIAC CATHETERIZATION     CORONARY STENT INTERVENTION N/A 08/09/2019   Procedure: CORONARY STENT INTERVENTION;  Surgeon: Nelva Bush, MD;  Location: [facility];  Service: Cardiovascular;  Laterality: N/A;   LEFT HEART CATH AND CORONARY ANGIOGRAPHY N/A 08/09/2019   Procedure: LEFT HEART CATH AND CORONARY ANGIOGRAPHY;  Surgeon: Nelva Bush, MD;  Location: [facility];  Service: Cardiovascular;  Laterality: N/A;     Current Outpatient Medications  Medication Sig Dispense Refill   acetaminophen (TYLENOL) 500 MG tablet Take 500-1,000 mg by mouth every 6 (six) hours as needed (for pain.).     alendronate (FOSAMAX) 70 MG tablet TAKE 1 TABLET (70 MG TOTAL) BY MOUTH ONCE A WEEK. TAKE WITH A FULL GLASS OF WATER ON AN EMPTY STOMACH. 12 tablet 3   amLODipine (NORVASC) 5 MG tablet Take 1 tablet (5 mg total) by mouth daily. 90 tablet 0   aspirin EC 81 MG tablet Take 1 tablet (81 mg total) by mouth daily. Swallow whole.     calcium carbonate (OS-CAL - DOSED IN MG OF ELEMENTAL CALCIUM) 1250 (500 Ca) MG tablet Take 500 mg by mouth daily.      carvedilol (COREG) 25 MG tablet TAKE 1 TABLET BY MOUTH TWICE A  DAY 180 tablet 0   cyanocobalamin 2000 MCG tablet Take 2,000 mcg by mouth daily.     fenofibrate 54 MG tablet TAKE 1 TABLET BY MOUTH EVERY DAY 90 tablet 1   ferrous sulfate 325 (65 FE) MG tablet TAKE 1 TABLET BY MOUTH EVERY DAY 90 tablet 0   letrozole (FEMARA) 2.5 MG tablet TAKE 1 TABLET BY MOUTH EVERY DAY 90 tablet 3   losartan (COZAAR) 25 MG tablet TAKE 1 TABLET BY MOUTH EVERY DAY 90 tablet 3   Omega-3 Fatty Acids (FISH OIL) 500 MG CAPS Take 500 mg by mouth daily.     PFIZER-BIONT COVID-19 VAC-TRIS SUSP injection      rosuvastatin (CRESTOR) 10 MG tablet TAKE 1 TABLET  BY MOUTH EVERY DAY 90 tablet 0   spironolactone (ALDACTONE) 25 MG tablet TAKE 1 TABLET (25 MG TOTAL) BY MOUTH DAILY. 90 tablet 1   No current facility-administered medications for this visit.    Allergies:   Patient has no known allergies.    Social History:  The patient  reports that she has never smoked. She has never used smokeless tobacco. She reports that she does not drink alcohol and does not use drugs.   Family History:  The patient's family history includes Breast cancer (age of onset: 5) in her sister; Cerebral palsy in her son; Congestive Heart Failure in her brother and brother; Diabetes in her mother; Heart failure in her mother; Hypertension in her brother and mother; Thyroid cancer in her sister.    ROS:  Please see the history of present illness.   Otherwise, review of systems are positive for none.   All other systems are reviewed and negative.    PHYSICAL EXAM: VS:  BP 124/70 (BP Location: Left Arm, Patient Position: Sitting, Cuff Size: Normal)   Pulse 82   Ht 5\' 3"  (1.6 m)   Wt 157 lb 2 oz (71.3 kg)   SpO2 98%   BMI 27.83 kg/m  , BMI Body mass index is 27.83 kg/m. GEN: Well nourished, well developed, in no acute distress  HEENT: normal  Neck: no JVD, carotid bruits, or masses Cardiac: RRR; no murmurs, rubs, or gallops, mild bilateral leg edema edema  Respiratory:  clear to auscultation bilaterally, normal work of breathing GI: soft, nontender, nondistended, + BS MS: no deformity or atrophy  Skin: warm and dry, no rash Neuro:  Strength and sensation are intact Psych: euthymic mood, full affect   EKG:  EKG is ordered today. The ekg ordered today demonstrates normal sinus rhythm with poor R wave progression in the anterior leads.   Recent Labs: 03/15/2021: ALT 11; BUN 17; Creat 1.00; Hemoglobin 13.7; Platelets 387; Potassium 4.4; Sodium 140    Lipid Panel    Component Value Date/Time   CHOL 152 03/15/2021 0930   CHOL 274 (H) 08/15/2015 0803   TRIG 158  (H) 03/15/2021 0930   HDL 58 03/15/2021 0930   HDL 38 (L) 08/15/2015 0803   CHOLHDL 2.6 03/15/2021 0930   VLDL 31.4 03/09/2020 0949   LDLCALC 70 03/15/2021 0930   LDLDIRECT 87.0 07/01/2019 0916      Wt Readings from Last 3 Encounters:  07/03/21 157 lb 2 oz (71.3 kg)  06/12/21 156 lb 6.4 oz (70.9 kg)  03/28/21 (P) 155 lb 8 oz (70.5 kg)       ASSESSMENT AND PLAN:  1.  Coronary artery disease involving native coronary arteries without angina: She is doing extremely well at the present time with no  anginal symptoms.  Continue medical therapy and aspirin 81 mg daily.  2.  Chronic systolic heart failure due to ischemic cardiomyopathy: Ejection fraction was 40 to 45% but improved to 50% on most recent echocardiogram.   Continue carvedilol, losartan and spironolactone.  I elected to increase losartan to 100 mg once daily.  Check basic metabolic profile in 1 week.  3.  Essential hypertension: Given cardiomyopathy, its best to maximize treatment with losartan.  Thus, I discontinued amlodipine and increase losartan.  This should also help with lower extremity edema.  4.  Hyperlipidemia: Currently on rosuvastatin 10 mg daily, fenofibrate and fish oil.  Most recent lipid profile showed an LDL of 34 and triglyceride of 157  5.  Breast cancer: Followed by oncology and currently is on Femara.   Disposition:   Follow-up in 6 months.   Signed,  Kathlyn Sacramento, MD  07/03/2021 3:07 PM    [facility]

## 2021-07-03 NOTE — Patient Instructions (Addendum)
Medication Instructions:  Your physician has recommended you make the following change in your medication:   STOP Amlodipine.  INCREASE Losartan to 100 mg daily. An Rx has been sent to your pharmacy.  *If you need a refill on your cardiac medications before your next appointment, please call your pharmacy*   Lab Work: Your physician recommends that you return for lab work ([place])  in: 1 week  If you have labs (blood work) drawn today and your tests are completely normal, you will receive your results only by: [place] (if you have MyChart) OR A paper copy in the mail If you have any lab test that is abnormal or we need to change your treatment, we will call you to review the results.   Testing/Procedures: None ordered   Follow-Up: At [facility], you and your health needs are our priority.  As part of our continuing mission to provide you with exceptional heart care, we have created designated Provider Care Teams.  These Care Teams include your primary Cardiologist (physician) and Advanced Practice Providers (APPs -  Physician Assistants and Nurse Practitioners) who all work together to provide you with the care you need, when you need it.  We recommend signing up for the patient portal called "MyChart".  Sign up information is provided on this After Visit Summary.  MyChart is used to connect with patients for Virtual Visits (Telemedicine).  Patients are able to view lab/test results, encounter notes, upcoming appointments, etc.  Non-urgent messages can be sent to your provider as well.   To learn more about what you can do with MyChart, go to [email or web address].    Your next appointment:   6 month(s)  The format for your next appointment:   In Person  Provider:   You may see Janice Sacramento, MD or one of the following Advanced Practice Providers on your designated Care Team:   Murray Hodgkins, NP Christell Faith, PA-C Cadence Kathlen Mody, [place]    Other  Instructions N/A

## 2021-07-10 ENCOUNTER — Other Ambulatory Visit: Payer: Self-pay

## 2021-07-10 ENCOUNTER — Other Ambulatory Visit (INDEPENDENT_AMBULATORY_CARE_PROVIDER_SITE_OTHER): Payer: Medicare HMO

## 2021-07-10 DIAGNOSIS — I5022 Chronic systolic (congestive) heart failure: Secondary | ICD-10-CM | POA: Diagnosis not present

## 2021-07-10 DIAGNOSIS — I10 Essential (primary) hypertension: Secondary | ICD-10-CM | POA: Diagnosis not present

## 2021-07-11 LAB — BASIC METABOLIC PANEL
BUN/Creatinine Ratio: 19 (ref 12–28)
BUN: 20 mg/dL (ref 8–27)
CO2: 21 mmol/L (ref 20–29)
Calcium: 10.9 mg/dL — ABNORMAL HIGH (ref 8.7–10.3)
Chloride: 107 mmol/L — ABNORMAL HIGH (ref 96–106)
Creatinine, Ser: 1.03 mg/dL — ABNORMAL HIGH (ref 0.57–1.00)
Glucose: 141 mg/dL — ABNORMAL HIGH (ref 70–99)
Potassium: 4.8 mmol/L (ref 3.5–5.2)
Sodium: 148 mmol/L — ABNORMAL HIGH (ref 134–144)
eGFR: 55 mL/min/{1.73_m2} — ABNORMAL LOW (ref >59)

## 2021-07-12 ENCOUNTER — Telehealth: Payer: Self-pay

## 2021-07-12 NOTE — Telephone Encounter (Signed)
DPR on file. Lmom with results. Patient is to contact the office if any questions.

## 2021-07-12 NOTE — Telephone Encounter (Signed)
-----   Message from Wellington Hampshire, MD sent at 07/11/2021  4:25 PM EST ----- Stable labs on losartan.

## 2021-07-23 ENCOUNTER — Other Ambulatory Visit: Payer: Self-pay | Admitting: Internal Medicine

## 2021-07-23 DIAGNOSIS — E7800 Pure hypercholesterolemia, unspecified: Secondary | ICD-10-CM

## 2021-07-23 NOTE — Telephone Encounter (Signed)
Requested medications are due for refill today.  yes  Requested medications are on the active medications list.  yes  Last refill. 04/30/2021  Future visit scheduled.   yes  Notes to clinic.  Pharmacy needs Dx code.    Requested Prescriptions  Pending Prescriptions Disp Refills   rosuvastatin (CRESTOR) 10 MG tablet [Pharmacy Med Name: ROSUVASTATIN CALCIUM 10 MG TAB] 90 tablet 0    Sig: TAKE 1 TABLET BY MOUTH EVERY DAY     Cardiovascular:  Antilipid - Statins Failed - 07/23/2021  2:05 PM      Failed - Triglycerides in normal range and within 360 days    Triglycerides  Date Value Ref Range Status  03/15/2021 158 (H) <150 mg/dL Final          Passed - Total Cholesterol in normal range and within 360 days    Cholesterol, Total  Date Value Ref Range Status  08/15/2015 274 (H) 100 - 199 mg/dL Final   Cholesterol  Date Value Ref Range Status  03/15/2021 152 <200 mg/dL Final          Passed - LDL in normal range and within 360 days    LDL Cholesterol (Calc)  Date Value Ref Range Status  03/15/2021 70 mg/dL (calc) Final    Comment:    Reference range: <100 . Desirable range <100 mg/dL for primary prevention;   <70 mg/dL for patients with CHD or diabetic patients  with > or = 2 CHD risk factors. Marland Kitchen LDL-C is now calculated using the Martin-Hopkins  calculation, which is a validated novel method providing  better accuracy than the Friedewald equation in the  estimation of LDL-C.  Cresenciano Genre et al. Annamaria Helling. 6812;751(70): 2061-2068  (http://education.[employer].com/faq/FAQ164)    Direct LDL  Date Value Ref Range Status  07/01/2019 87.0 mg/dL Final    Comment:    Optimal:  <100 mg/dLNear or Above Optimal:  100-129 mg/dLBorderline High:  130-159 mg/dLHigh:  160-189 mg/dLVery High:  >190 mg/dL          Passed - HDL in normal range and within 360 days    HDL  Date Value Ref Range Status  03/15/2021 58 > OR = 50 mg/dL Final  08/15/2015 38 (L) >39 mg/dL Final           Passed - Patient is not pregnant      Passed - Valid encounter within last 12 months    Recent Outpatient Visits           4 months ago Medicare annual wellness visit, subsequent   [facility] Moyers, Coralie Keens, NP       Future Appointments             In 1 month Baity, Coralie Keens, NP [facility], [place]   In 5 months Fletcher Anon, Mertie Clause, MD [facility], [place]

## 2021-07-25 ENCOUNTER — Other Ambulatory Visit: Payer: Self-pay | Admitting: Internal Medicine

## 2021-07-25 NOTE — Telephone Encounter (Signed)
Requested medication (s) are due for refill today: yes  Requested medication (s) are on the active medication list: yes  Last refill:  04/30/21 #90/0RF  Future visit scheduled: yes  Notes to clinic:  Unable to refill per protocol, medication not assigned to the refill protocol.      Requested Prescriptions  Pending Prescriptions Disp Refills   ferrous sulfate 325 (65 FE) MG tablet [Pharmacy Med Name: FERROUS SULFATE 325 MG TABLET] 90 tablet 0    Sig: TAKE 1 TABLET BY MOUTH EVERY DAY     There is no refill protocol information for this order

## 2021-07-27 ENCOUNTER — Other Ambulatory Visit: Payer: Self-pay

## 2021-09-03 ENCOUNTER — Other Ambulatory Visit: Payer: Self-pay | Admitting: Internal Medicine

## 2021-09-04 NOTE — Telephone Encounter (Signed)
Requested Prescriptions  Pending Prescriptions Disp Refills   carvedilol (COREG) 25 MG tablet [Pharmacy Med Name: CARVEDILOL 25 MG TABLET] 180 tablet 0    Sig: TAKE 1 TABLET BY MOUTH TWICE A DAY     Cardiovascular:  Beta Blockers Passed - 09/03/2021  8:15 PM      Passed - Last BP in normal range    BP Readings from Last 1 Encounters:  07/03/21 124/70         Passed - Last Heart Rate in normal range    Pulse Readings from Last 1 Encounters:  07/03/21 82         Passed - Valid encounter within last 6 months    Recent Outpatient Visits          5 months ago Medicare annual wellness visit, subsequent   [facility] [place], Coralie Keens, NP      Future Appointments            In 2 weeks [place], Coralie Keens, NP [facility], [place]   In 3 months [place] Anon, Mertie Clause, MD [facility], [place]

## 2021-09-06 ENCOUNTER — Other Ambulatory Visit: Payer: Self-pay | Admitting: Cardiovascular Disease

## 2021-09-17 ENCOUNTER — Ambulatory Visit: Payer: Medicare HMO | Admitting: Internal Medicine

## 2021-09-18 ENCOUNTER — Other Ambulatory Visit: Payer: Self-pay

## 2021-09-18 ENCOUNTER — Ambulatory Visit (INDEPENDENT_AMBULATORY_CARE_PROVIDER_SITE_OTHER): Payer: Medicare HMO | Admitting: Internal Medicine

## 2021-09-18 ENCOUNTER — Encounter: Payer: Self-pay | Admitting: Internal Medicine

## 2021-09-18 VITALS — BP 139/66 | HR 83 | Temp 96.9°F | Wt 156.0 lb

## 2021-09-18 DIAGNOSIS — M810 Age-related osteoporosis without current pathological fracture: Secondary | ICD-10-CM

## 2021-09-18 DIAGNOSIS — I2510 Atherosclerotic heart disease of native coronary artery without angina pectoris: Secondary | ICD-10-CM | POA: Diagnosis not present

## 2021-09-18 DIAGNOSIS — E782 Mixed hyperlipidemia: Secondary | ICD-10-CM

## 2021-09-18 DIAGNOSIS — Z6827 Body mass index (BMI) 27.0-27.9, adult: Secondary | ICD-10-CM

## 2021-09-18 DIAGNOSIS — E119 Type 2 diabetes mellitus without complications: Secondary | ICD-10-CM

## 2021-09-18 DIAGNOSIS — I700 Atherosclerosis of aorta: Secondary | ICD-10-CM

## 2021-09-18 DIAGNOSIS — E663 Overweight: Secondary | ICD-10-CM | POA: Diagnosis not present

## 2021-09-18 DIAGNOSIS — N1831 Chronic kidney disease, stage 3a: Secondary | ICD-10-CM | POA: Diagnosis not present

## 2021-09-18 DIAGNOSIS — C50511 Malignant neoplasm of lower-outer quadrant of right female breast: Secondary | ICD-10-CM | POA: Diagnosis not present

## 2021-09-18 DIAGNOSIS — D500 Iron deficiency anemia secondary to blood loss (chronic): Secondary | ICD-10-CM | POA: Diagnosis not present

## 2021-09-18 DIAGNOSIS — I5022 Chronic systolic (congestive) heart failure: Secondary | ICD-10-CM | POA: Diagnosis not present

## 2021-09-18 DIAGNOSIS — I214 Non-ST elevation (NSTEMI) myocardial infarction: Secondary | ICD-10-CM

## 2021-09-18 DIAGNOSIS — I10 Essential (primary) hypertension: Secondary | ICD-10-CM | POA: Diagnosis not present

## 2021-09-18 LAB — POCT GLYCOSYLATED HEMOGLOBIN (HGB A1C): Hemoglobin A1C: 6.2 % — AB (ref 4.0–5.6)

## 2021-09-18 NOTE — Assessment & Plan Note (Signed)
Controlled on Spironolactone, Losartan and Carvedilol °Reinforced DASH diet and exercise for weight loss °C-Met today °

## 2021-09-18 NOTE — Assessment & Plan Note (Signed)
Continue Letrozole She will continue mammograms yearly

## 2021-09-18 NOTE — Assessment & Plan Note (Signed)
POCT A1c 6.2% We will check urine microalbumin Encouraged her to consume a low-carb diet and exercise for weight loss Diet controlled, no medications We will request copy of eye exam Encouraged routine foot exam Flu and Pneumovax UTD COVID-vaccine UTD

## 2021-09-18 NOTE — Assessment & Plan Note (Signed)
C-Met and lipid profile today °Encouraged her to consume a low-fat diet °Continue Rosuvastatin, Fenofibrate, Spironolactone, Losartan, Carvedilol and Aspirin °

## 2021-09-18 NOTE — Assessment & Plan Note (Signed)
Compensated C-Met today Continue Spironolactone, Losartan, Carvedilol and Aspirin Monitor weights

## 2021-09-18 NOTE — Patient Instructions (Signed)
Heart-Healthy Eating Plan Heart-healthy meal planning includes: Eating less unhealthy fats. Eating more healthy fats. Making other changes in your diet. Talk with your doctor or a diet specialist (dietitian) to create an eating plan that is right for you. What is my plan? Your doctor may recommend an eating plan that includes: Total fat: ______% or less of total calories a day. Saturated fat: ______% or less of total calories a day. Cholesterol: less than _________mg a day. What are tips for following this plan? Cooking Avoid frying your food. Try to bake, boil, grill, or broil it instead. You can also reduce fat by: Removing the skin from poultry. Removing all visible fats from meats. Steaming vegetables in water or broth. Meal planning  At meals, divide your plate into four equal parts: Fill one-half of your plate with vegetables and green salads. Fill one-fourth of your plate with whole grains. Fill one-fourth of your plate with lean protein foods. Eat 4-5 servings of vegetables per day. A serving of vegetables is: 1 cup of raw or cooked vegetables. 2 cups of raw leafy greens. Eat 4-5 servings of fruit per day. A serving of fruit is: 1 medium whole fruit.  cup of dried fruit.  cup of fresh, frozen, or canned fruit.  cup of 100% fruit juice. Eat more foods that have soluble fiber. These are apples, broccoli, carrots, beans, peas, and barley. Try to get 20-30 g of fiber per day. Eat 4-5 servings of nuts, legumes, and seeds per week: 1 serving of dried beans or legumes equals  cup after being cooked. 1 serving of nuts is  cup. 1 serving of seeds equals 1 tablespoon. General information Eat more home-cooked food. Eat less restaurant, buffet, and fast food. Limit or avoid alcohol. Limit foods that are high in starch and sugar. Avoid fried foods. Lose weight if you are overweight. Keep track of how much salt (sodium) you eat. This is important if you have high blood  pressure. Ask your doctor to tell you more about this. Try to add vegetarian meals each week. Fats Choose healthy fats. These include olive oil and canola oil, flaxseeds, walnuts, almonds, and seeds. Eat more omega-3 fats. These include salmon, mackerel, sardines, tuna, flaxseed oil, and ground flaxseeds. Try to eat fish at least 2 times each week. Check food labels. Avoid foods with trans fats or high amounts of saturated fat. Limit saturated fats. These are often found in animal products, such as meats, butter, and cream. These are also found in plant foods, such as palm oil, palm kernel oil, and coconut oil. Avoid foods with partially hydrogenated oils in them. These have trans fats. Examples are stick margarine, some tub margarines, cookies, crackers, and other baked goods. What foods can I eat? Fruits All fresh, canned (in natural juice), or frozen fruits. Vegetables Fresh or frozen vegetables (raw, steamed, roasted, or grilled). Green salads. Grains Most grains. Choose whole wheat and whole grains most of the time. Rice and pasta, including brown rice and pastas made with whole wheat. Meats and other proteins Lean, well-trimmed beef, veal, pork, and lamb. Chicken and turkey without skin. All fish and shellfish. Wild duck, rabbit, pheasant, and venison. Egg whites or low-cholesterol egg substitutes. Dried beans, peas, lentils, and tofu. Seeds and most nuts. Dairy Low-fat or nonfat cheeses, including ricotta and mozzarella. Skim or 1% milk that is liquid, powdered, or evaporated. Buttermilk that is made with low-fat milk. Nonfat or low-fat yogurt. Fats and oils Non-hydrogenated (trans-free) margarines. Vegetable oils, including   soybean, sesame, sunflower, olive, peanut, safflower, corn, canola, and cottonseed. Salad dressings or mayonnaise made with a vegetable oil. Beverages Mineral water. Coffee and tea. Diet carbonated beverages. Sweets and desserts Sherbet, gelatin, and fruit ice.  Small amounts of dark chocolate. Limit all sweets and desserts. Seasonings and condiments All seasonings and condiments. The items listed above may not be a complete list of foods and drinks you can eat. Contact a dietitian for more options. What foods should I avoid? Fruits Canned fruit in heavy syrup. Fruit in cream or butter sauce. Fried fruit. Limit coconut. Vegetables Vegetables cooked in cheese, cream, or butter sauce. Fried vegetables. Grains Breads that are made with saturated or trans fats, oils, or whole milk. Croissants. Sweet rolls. Donuts. High-fat crackers, such as cheese crackers. Meats and other proteins Fatty meats, such as hot dogs, ribs, sausage, bacon, rib-eye roast or steak. High-fat deli meats, such as salami and bologna. Caviar. Domestic duck and goose. Organ meats, such as liver. Dairy Cream, sour cream, cream cheese, and creamed cottage cheese. Whole-milk cheeses. Whole or 2% milk that is liquid, evaporated, or condensed. Whole buttermilk. Cream sauce or high-fat cheese sauce. Yogurt that is made from whole milk. Fats and oils Meat fat, or shortening. Cocoa butter, hydrogenated oils, palm oil, coconut oil, palm kernel oil. Solid fats and shortenings, including bacon fat, salt pork, lard, and butter. Nondairy cream substitutes. Salad dressings with cheese or sour cream. Beverages Regular sodas and juice drinks with added sugar. Sweets and desserts Frosting. Pudding. Cookies. Cakes. Pies. Milk chocolate or white chocolate. Buttered syrups. Full-fat ice cream or ice cream drinks. The items listed above may not be a complete list of foods and drinks to avoid. Contact a dietitian for more information. Summary Heart-healthy meal planning includes eating less unhealthy fats, eating more healthy fats, and making other changes in your diet. Eat a balanced diet. This includes fruits and vegetables, low-fat or nonfat dairy, lean protein, nuts and legumes, whole grains, and  heart-healthy oils and fats. This information is not intended to replace advice given to you by your health care provider. Make sure you discuss any questions you have with your health care provider. Document Revised: 12/07/2020 Document Reviewed: 12/07/2020 Elsevier Patient Education  2022 Elsevier Inc.  

## 2021-09-18 NOTE — Progress Notes (Signed)
Subjective:    Patient ID: Janice Liu, female    DOB: 31-Jul-1941, 81 y.o.   MRN: [id number]  HPI  Pt presents to the clinic today for 6 month follow up of chronic conditions.  HLD with Aortic Atherosclerosis, CAD s/p MI. Her last LDL was 70, triglycerides 158, 03/2021. She denies myalgias on Rosuvastatin and Fenofibrate. She is taking Aspirin, spironolactone, Losartan, and Carvedilol as well. She follows with cardiology.  DM 2: Her last A1C was 6.2%, 03/2021. She is not taking any oral diabetic medication at this time. She does not check her sugars. She checks her feet routinely. Her last eye exam was 03/2021, [facility]. Flu 05/2021. Pneumovax never. Prevnar 03/2021. Covid Pfizer x 4.  HTN: Her BP today is 139/66. She is taking Spironolactone, Losartan, Carvedilol and Spironolactone as prescribed. ECG from 06/2021 reviewed.  CHF, Systolic: She denies chronic cough or SOB. She is taking Carvedilol, Losartan and Spironolactone as prescribed. Echo from 10/2019 reviewed.  History of Right Breast Cancer: s/ lumpectomy and radiation. She is taking Letrozole as prescribed. She follows with oncology.  Iron Deficiency Anemia: Her last H/H was 13.7/41.4, 03/2021. She is taking an oral Iron supplement. She follows with hematology.  CKD 3: Her last creatinine was 1, GFR 57, 03/2021. She is on Losartan for renal protection. She follows with nephrology.  Osteoporosis: Bone density from 03/2020 reviewed. She is taking Fosamax as prescribed. She tries to get weight bearing exercise in daily.   Review of Systems     Past Medical History:  Diagnosis Date   Allergy    CAD (coronary artery disease)    Chicken pox    Diabetes mellitus (HCC)    Heart murmur    HFrEF (heart failure with reduced ejection fraction) (HCC)    Hypertension     Current Outpatient Medications  Medication Sig Dispense Refill   acetaminophen (TYLENOL) 500 MG tablet Take 500-1,000 mg by mouth every 6 (six) hours as  needed (for pain.).     alendronate (FOSAMAX) 70 MG tablet TAKE 1 TABLET (70 MG TOTAL) BY MOUTH ONCE A WEEK. TAKE WITH A FULL GLASS OF WATER ON AN EMPTY STOMACH. 12 tablet 3   aspirin EC 81 MG tablet Take 1 tablet (81 mg total) by mouth daily. Swallow whole.     calcium carbonate (OS-CAL - DOSED IN MG OF ELEMENTAL CALCIUM) 1250 (500 Ca) MG tablet Take 500 mg by mouth daily.      carvedilol (COREG) 25 MG tablet TAKE 1 TABLET BY MOUTH TWICE A DAY 180 tablet 0   cyanocobalamin 2000 MCG tablet Take 2,000 mcg by mouth daily.     fenofibrate 54 MG tablet TAKE 1 TABLET BY MOUTH EVERY DAY 90 tablet 1   ferrous sulfate 325 (65 FE) MG tablet TAKE 1 TABLET BY MOUTH EVERY DAY 90 tablet 0   letrozole (FEMARA) 2.5 MG tablet TAKE 1 TABLET BY MOUTH EVERY DAY 90 tablet 3   losartan (COZAAR) 100 MG tablet Take 1 tablet (100 mg total) by mouth daily. 30 tablet 5   Omega-3 Fatty Acids (FISH OIL) 500 MG CAPS Take 500 mg by mouth daily.     PFIZER-BIONT COVID-19 VAC-TRIS SUSP injection      rosuvastatin (CRESTOR) 10 MG tablet TAKE 1 TABLET BY MOUTH EVERY DAY 90 tablet 0   spironolactone (ALDACTONE) 25 MG tablet TAKE 1 TABLET (25 MG TOTAL) BY MOUTH DAILY. 90 tablet 1   No current facility-administered medications for this visit.  No Known Allergies  Family History  Problem Relation Age of Onset   Heart failure Mother    Diabetes Mother    Hypertension Mother    Congestive Heart Failure Brother    Hypertension Brother    Cerebral palsy Son    Congestive Heart Failure Brother    Breast cancer Sister 28   Thyroid cancer Sister     Social History   Socioeconomic History   Marital status: Widowed    Spouse name: Not on file   Number of children: Not on file   Years of education: Not on file   Highest education level: Not on file  Occupational History   Not on file  Tobacco Use   Smoking status: Never   Smokeless tobacco: Never  Vaping Use   Vaping Use: Never used  Substance and Sexual Activity    Alcohol use: No    Alcohol/week: 0.0 standard drinks   Drug use: No   Sexual activity: Never  Other Topics Concern   Not on file  Social History Narrative   Not on file   Social Determinants of Health   Financial Resource Strain: Not on file  Food Insecurity: Not on file  Transportation Needs: Not on file  Physical Activity: Not on file  Stress: Not on file  Social Connections: Not on file  Intimate Partner Violence: Not on file     Constitutional: Denies fever, malaise, fatigue, headache or abrupt weight changes.  Respiratory: Denies difficulty breathing, shortness of breath, cough or sputum production.   Cardiovascular: Denies chest pain, chest tightness, palpitations or swelling in the hands or feet.  Musculoskeletal: Denies decrease in range of motion, difficulty with gait, muscle pain or joint pain and swelling.  Skin: Denies redness, rashes, lesions or ulcercations.  Neurological: Denies dizziness, difficulty with memory, difficulty with speech or problems with balance and coordination.   No other specific complaints in a complete review of systems (except as listed in HPI above).  BP 139/66 (BP Location: Left Arm, Patient Position: Sitting, Cuff Size: Large)    Pulse 83    Temp (!) 96.9 F (36.1 C) (Oral)    Wt 156 lb (70.8 kg)    SpO2 99%    BMI 27.63 kg/m   Wt Readings from Last 3 Encounters:  07/03/21 157 lb 2 oz (71.3 kg)  06/12/21 156 lb 6.4 oz (70.9 kg)  03/28/21 (P) 155 lb 8 oz (70.5 kg)    General: Appears her stated age, overweight, in NAD. Skin: Warm, dry and intact. No ulcerations noted. HEENT: Head: normal shape and size; Eyes: sclera white and EOMs intact;  Cardiovascular: Normal rate and rhythm. S1,S2 noted.  No murmur, rubs or gallops noted. No JVD or BLE edema. No carotid bruits noted. Pulmonary/Chest: Normal effort and positive vesicular breath sounds. No respiratory distress. No wheezes, rales or ronchi noted.  Musculoskeletal: Kyphotic.   Strength 5/5 BUE/BLE.  No difficulty with gait.  Neurological: Alert and oriented. Cranial nerves II-XII grossly intact. Coordination normal.  Psychiatric: Mood and affect normal. Behavior is normal. Judgment and thought content normal.     BMET    Component Value Date/Time   NA 148 (H) 07/10/2021 0938   K 4.8 07/10/2021 0938   CL 107 (H) 07/10/2021 0938   CO2 21 07/10/2021 0938   GLUCOSE 141 (H) 07/10/2021 0938   GLUCOSE 125 (H) 03/15/2021 0930   BUN 20 07/10/2021 0938   CREATININE 1.03 (H) 07/10/2021 0938   CREATININE 1.00 (H) 03/15/2021  0930   CALCIUM 10.9 (H) 07/10/2021 0938   GFRNONAA 49 (L) 12/08/2019 1113   GFRAA 57 (L) 12/08/2019 1113    Lipid Panel     Component Value Date/Time   CHOL 152 03/15/2021 0930   CHOL 274 (H) 08/15/2015 0803   TRIG 158 (H) 03/15/2021 0930   HDL 58 03/15/2021 0930   HDL 38 (L) 08/15/2015 0803   CHOLHDL 2.6 03/15/2021 0930   VLDL 31.4 03/09/2020 0949   LDLCALC 70 03/15/2021 0930    CBC    Component Value Date/Time   WBC 9.3 03/15/2021 0930   RBC 4.54 03/15/2021 0930   HGB 13.7 03/15/2021 0930   HGB 14.5 10/20/2020 1521   HCT 41.4 03/15/2021 0930   HCT 43.4 10/20/2020 1521   PLT 387 03/15/2021 0930   PLT 438 10/20/2020 1521   MCV 91.2 03/15/2021 0930   MCV 90 10/20/2020 1521   MCH 30.2 03/15/2021 0930   MCHC 33.1 03/15/2021 0930   RDW 13.2 03/15/2021 0930   RDW 11.9 10/20/2020 1521   LYMPHSABS 2.5 10/20/2020 1521   MONOABS 0.6 12/08/2019 1113   EOSABS 0.2 10/20/2020 1521   BASOSABS 0.0 10/20/2020 1521    Hgb A1C Lab Results  Component Value Date   HGBA1C 6.2 (H) 03/15/2021          Assessment & Plan:    Janice Silversmith, NP This visit occurred during the SARS-CoV-2 public health emergency.  Safety protocols were in place, including screening questions prior to the visit, additional usage of staff PPE, and extensive cleaning of exam room while observing appropriate contact time as indicated for disinfecting  solutions.

## 2021-09-18 NOTE — Assessment & Plan Note (Signed)
Encourage daily weightbearing exercise Continue Fosamax

## 2021-09-18 NOTE — Assessment & Plan Note (Signed)
CBC today Continue oral iron 

## 2021-09-18 NOTE — Assessment & Plan Note (Signed)
C-Met and lipid profile today Encouraged her to consume a low-fat diet Continue Rosuvastatin and Fenofibrate

## 2021-09-18 NOTE — Assessment & Plan Note (Signed)
Encourage diet and exercise for weight loss 

## 2021-09-18 NOTE — Assessment & Plan Note (Signed)
C-Met today Continue Losartan for renal protection

## 2021-09-19 LAB — HEMOGLOBIN A1C
Hgb A1c MFr Bld: 6.4 % of total Hgb — ABNORMAL HIGH (ref <5.7)
Mean Plasma Glucose: 137 mg/dL
eAG (mmol/L): 7.6 mmol/L

## 2021-09-19 LAB — CBC
HCT: 38.1 % (ref 35.0–45.0)
Hemoglobin: 12.9 g/dL (ref 11.7–15.5)
MCH: 30.4 pg (ref 27.0–33.0)
MCHC: 33.9 g/dL (ref 32.0–36.0)
MCV: 89.9 fL (ref 80.0–100.0)
MPV: 9.8 fL (ref 7.5–12.5)
Platelets: 400 10*3/uL (ref 140–400)
RBC: 4.24 10*6/uL (ref 3.80–5.10)
RDW: 12.7 % (ref 11.0–15.0)
WBC: 9.5 10*3/uL (ref 3.8–10.8)

## 2021-09-19 LAB — COMPLETE METABOLIC PANEL WITH GFR
AG Ratio: 1.6 (calc) (ref 1.0–2.5)
ALT: 14 U/L (ref 6–29)
AST: 17 U/L (ref 10–35)
Albumin: 4.6 g/dL (ref 3.6–5.1)
Alkaline phosphatase (APISO): 45 U/L (ref 37–153)
BUN/Creatinine Ratio: 21 (calc) (ref 6–22)
BUN: 23 mg/dL (ref 7–25)
CO2: 26 mmol/L (ref 20–32)
Calcium: 10.3 mg/dL (ref 8.6–10.4)
Chloride: 103 mmol/L (ref 98–110)
Creat: 1.1 mg/dL — ABNORMAL HIGH (ref 0.60–0.95)
Globulin: 2.8 g/dL (calc) (ref 1.9–3.7)
Glucose, Bld: 115 mg/dL — ABNORMAL HIGH (ref 65–99)
Potassium: 4.4 mmol/L (ref 3.5–5.3)
Sodium: 140 mmol/L (ref 135–146)
Total Bilirubin: 0.7 mg/dL (ref 0.2–1.2)
Total Protein: 7.4 g/dL (ref 6.1–8.1)
eGFR: 51 mL/min/{1.73_m2} — ABNORMAL LOW (ref >=60)

## 2021-09-19 LAB — LIPID PANEL
Cholesterol: 150 mg/dL (ref <200)
HDL: 53 mg/dL (ref >=50)
LDL Cholesterol (Calc): 70 mg/dL (calc)
Non-HDL Cholesterol (Calc): 97 mg/dL (calc) (ref <130)
Total CHOL/HDL Ratio: 2.8 (calc) (ref <5.0)
Triglycerides: 197 mg/dL — ABNORMAL HIGH (ref <150)

## 2021-09-19 LAB — MICROALBUMIN / CREATININE URINE RATIO
Creatinine, Urine: 90 mg/dL (ref 20–275)
Microalb Creat Ratio: 10 mcg/mg creat (ref <30)
Microalb, Ur: 0.9 mg/dL

## 2021-09-20 MED ORDER — FENOFIBRATE 145 MG PO TABS: 145.0000 mg | ORAL_TABLET | Freq: Every day | ORAL | 1 refills | Status: DC

## 2021-09-20 NOTE — Addendum Note (Signed)
Addended by: Jearld Fenton on: 09/20/2021 04:44 PM   Modules accepted: Orders

## 2021-10-22 ENCOUNTER — Other Ambulatory Visit: Payer: Self-pay | Admitting: Internal Medicine

## 2021-10-22 DIAGNOSIS — E7800 Pure hypercholesterolemia, unspecified: Secondary | ICD-10-CM

## 2021-10-22 NOTE — Telephone Encounter (Signed)
Requested Prescriptions  ?Pending Prescriptions Disp Refills  ?? rosuvastatin (CRESTOR) 10 MG tablet [Pharmacy Med Name: ROSUVASTATIN CALCIUM 10 MG TAB] 90 tablet 0  ?  Sig: TAKE 1 TABLET BY MOUTH EVERY DAY  ?  ? Cardiovascular:  Antilipid - Statins 2 Failed - 10/22/2021  1:48 AM  ?  ?  Failed - Cr in normal range and within 360 days  ?  Creat  ?Date Value Ref Range Status  ?09/18/2021 1.10 (H) 0.60 - 0.95 mg/dL Final  ? ?Creatinine, Urine  ?Date Value Ref Range Status  ?09/18/2021 90 20 - 275 mg/dL Final  ?   ?  ?  Failed - Lipid Panel in normal range within the last 12 months  ?  Cholesterol, Total  ?Date Value Ref Range Status  ?08/15/2015 274 (H) 100 - 199 mg/dL Final  ? ?Cholesterol  ?Date Value Ref Range Status  ?09/18/2021 150 <200 mg/dL Final  ? ?LDL Cholesterol (Calc)  ?Date Value Ref Range Status  ?09/18/2021 70 mg/dL (calc) Final  ?  Comment:  ?  Reference range: <100 ?Marland Kitchen ?Desirable range <100 mg/dL for primary prevention;   ?<70 mg/dL for patients with CHD or diabetic patients  ?with > or = 2 CHD risk factors. ?. ?LDL-C is now calculated using the Martin-Hopkins  ?calculation, which is a validated novel method providing  ?better accuracy than the Friedewald equation in the  ?estimation of LDL-C.  ?Cresenciano Genre et al. Annamaria Helling. 4627;035(00): 2061-2068  ?(http://education.[employer].com/faq/FAQ164) ?  ? ?Direct LDL  ?Date Value Ref Range Status  ?07/01/2019 87.0 mg/dL Final  ?  Comment:  ?  Optimal:  <100 mg/dLNear or Above Optimal:  100-129 mg/dLBorderline High:  130-159 mg/dLHigh:  160-189 mg/dLVery High:  >190 mg/dL  ? ?HDL  ?Date Value Ref Range Status  ?09/18/2021 53 > OR = 50 mg/dL Final  ?08/15/2015 38 (L) >39 mg/dL Final  ? ?Triglycerides  ?Date Value Ref Range Status  ?09/18/2021 197 (H) <150 mg/dL Final  ? ?  ?  ?  Passed - Patient is not pregnant  ?  ?  Passed - Valid encounter within last 12 months  ?  Recent Outpatient Visits   ?      ? 1 month ago Type 2 diabetes mellitus without complication,  without long-term current use of insulin ([place])  ? [facility] [place], Coralie Keens, NP  ? 7 months ago Medicare annual wellness visit, subsequent  ? [facility] [place], Coralie Keens, NP  ?  ?  ?Future Appointments   ?        ? In 2 months Wellington Hampshire, MD [facility]  ? In 5 months Baity, Coralie Keens, NP [facility]  ?  ? ?  ?  ?  ?? ferrous sulfate 325 (65 FE) MG tablet [Pharmacy Med Name: FERROUS SULFATE 325 MG TABLET] 90 tablet 0  ?  Sig: TAKE 1 TABLET BY MOUTH EVERY DAY  ?  ? Endocrinology:  Minerals - Iron Supplementation Failed - 10/22/2021  1:48 AM  ?  ?  Failed - Fe (serum) in normal range and within 360 days  ?  Iron  ?Date Value Ref Range Status  ?10/20/2020 74 27 - 139 ug/dL Final  ? ?Iron Saturation  ?Date Value Ref Range Status  ?10/20/2020 20 15 - 55 % Final  ?   ?  ?  Failed - Ferritin in normal range and within 360 days  ?  Ferritin  ?Date Value Ref Range Status  ?10/20/2020 147 15 - 150 ng/mL Final  ?   ?  ?  Passed - HGB in normal range and within 360 days  ?  Hemoglobin  ?Date Value Ref Range Status  ?09/18/2021 12.9 11.7 - 15.5 g/dL Final  ?10/20/2020 14.5 11.1 - 15.9 g/dL Final  ?   ?  ?  Passed - HCT in normal range and within 360 days  ?  HCT  ?Date Value Ref Range Status  ?09/18/2021 38.1 35.0 - 45.0 % Final  ? ?Hematocrit  ?Date Value Ref Range Status  ?10/20/2020 43.4 34.0 - 46.6 % Final  ?   ?  ?  Passed - RBC in normal range and within 360 days  ?  RBC  ?Date Value Ref Range Status  ?09/18/2021 4.24 3.80 - 5.10 Million/uL Final  ?   ?  ?  Passed - Valid encounter within last 12 months  ?  Recent Outpatient Visits   ?      ? 1 month ago Type 2 diabetes mellitus without complication, without long-term current use of insulin ([place])  ? [facility] [place], Coralie Keens, NP  ? 7 months ago Medicare annual wellness visit, subsequent  ? [facility] [place], Coralie Keens, NP  ?  ?  ?Future Appointments   ?         ? In 2 months Wellington Hampshire, MD [facility], [facility]  ? In 5 months Baity, Coralie Keens, NP [facility], [place]  ?  ? ?  ?  ?  ? ?

## 2021-11-11 ENCOUNTER — Other Ambulatory Visit: Payer: Self-pay | Admitting: Oncology

## 2021-11-16 ENCOUNTER — Other Ambulatory Visit: Payer: Self-pay | Admitting: Oncology

## 2021-11-26 ENCOUNTER — Other Ambulatory Visit: Payer: Self-pay | Admitting: Internal Medicine

## 2021-11-27 NOTE — Telephone Encounter (Signed)
Requested Prescriptions  ?Pending Prescriptions Disp Refills  ?? carvedilol (COREG) 25 MG tablet [Pharmacy Med Name: CARVEDILOL 25 MG TABLET] 180 tablet 0  ?  Sig: TAKE 1 TABLET BY MOUTH TWICE A DAY  ?  ? Cardiovascular: Beta Blockers 3 Failed - 11/26/2021  7:44 PM  ?  ?  Failed - Cr in normal range and within 360 days  ?  Creat  ?Date Value Ref Range Status  ?09/18/2021 1.10 (H) 0.60 - 0.95 mg/dL Final  ? ?Creatinine, Urine  ?Date Value Ref Range Status  ?09/18/2021 90 20 - 275 mg/dL Final  ?   ?  ?  Passed - AST in normal range and within 360 days  ?  AST  ?Date Value Ref Range Status  ?09/18/2021 17 10 - 35 U/L Final  ?   ?  ?  Passed - ALT in normal range and within 360 days  ?  ALT  ?Date Value Ref Range Status  ?09/18/2021 14 6 - 29 U/L Final  ?   ?  ?  Passed - Last BP in normal range  ?  BP Readings from Last 1 Encounters:  ?09/18/21 139/66  ?   ?  ?  Passed - Last Heart Rate in normal range  ?  Pulse Readings from Last 1 Encounters:  ?09/18/21 83  ?   ?  ?  Passed - Valid encounter within last 6 months  ?  Recent Outpatient Visits   ?      ? 2 months ago Type 2 diabetes mellitus without complication, without long-term current use of insulin ([place])  ? [facility], Coralie Keens, NP  ? 8 months ago Medicare annual wellness visit, subsequent  ? [facility] [place], Coralie Keens, NP  ?  ?  ?Future Appointments   ?        ? In 1 month Ar[place], Mertie Clause, MD [facility], [facility]  ? In 3 months Baity, Coralie Keens, NP [facility], [place]  ?  ? ?  ?  ?  ? ? ?

## 2021-12-17 ENCOUNTER — Ambulatory Visit
Admission: RE | Admit: 2021-12-17 | Discharge: 2021-12-17 | Disposition: A | Discharge status: Home / Self Care | Payer: Medicare HMO | Source: Ambulatory Visit | Attending: Oncology | Admitting: Oncology

## 2021-12-17 DIAGNOSIS — Z853 Personal history of malignant neoplasm of breast: Secondary | ICD-10-CM | POA: Diagnosis not present

## 2021-12-17 DIAGNOSIS — C50511 Malignant neoplasm of lower-outer quadrant of right female breast: Secondary | ICD-10-CM | POA: Insufficient documentation

## 2021-12-17 HISTORY — DX: Malignant neoplasm of unspecified site of unspecified female breast: C50.919

## 2021-12-17 HISTORY — DX: Personal history of irradiation: Z92.3

## 2021-12-20 ENCOUNTER — Other Ambulatory Visit: Payer: Self-pay | Admitting: Cardiovascular Disease

## 2021-12-21 ENCOUNTER — Inpatient Hospital Stay: Payer: Medicare HMO | Attending: Nurse Practitioner | Admitting: Nurse Practitioner

## 2021-12-21 ENCOUNTER — Encounter: Payer: Self-pay | Admitting: Nurse Practitioner

## 2021-12-21 VITALS — BP 130/70 | HR 81 | Temp 97.2°F | Resp 16 | Wt 158.9 lb

## 2021-12-21 DIAGNOSIS — Z170 Estrogen receptor positive status [ER+]: Secondary | ICD-10-CM | POA: Insufficient documentation

## 2021-12-21 DIAGNOSIS — Z79811 Long term (current) use of aromatase inhibitors: Secondary | ICD-10-CM | POA: Insufficient documentation

## 2021-12-21 DIAGNOSIS — I110 Hypertensive heart disease with heart failure: Secondary | ICD-10-CM | POA: Diagnosis not present

## 2021-12-21 DIAGNOSIS — Z923 Personal history of irradiation: Secondary | ICD-10-CM | POA: Diagnosis not present

## 2021-12-21 DIAGNOSIS — E119 Type 2 diabetes mellitus without complications: Secondary | ICD-10-CM | POA: Diagnosis not present

## 2021-12-21 DIAGNOSIS — Z5181 Encounter for therapeutic drug level monitoring: Secondary | ICD-10-CM | POA: Diagnosis not present

## 2021-12-21 DIAGNOSIS — Z853 Personal history of malignant neoplasm of breast: Secondary | ICD-10-CM

## 2021-12-21 DIAGNOSIS — C50511 Malignant neoplasm of lower-outer quadrant of right female breast: Secondary | ICD-10-CM | POA: Insufficient documentation

## 2021-12-21 DIAGNOSIS — Z808 Family history of malignant neoplasm of other organs or systems: Secondary | ICD-10-CM | POA: Insufficient documentation

## 2021-12-21 DIAGNOSIS — D509 Iron deficiency anemia, unspecified: Secondary | ICD-10-CM | POA: Insufficient documentation

## 2021-12-21 DIAGNOSIS — Z803 Family history of malignant neoplasm of breast: Secondary | ICD-10-CM | POA: Insufficient documentation

## 2021-12-21 DIAGNOSIS — M810 Age-related osteoporosis without current pathological fracture: Secondary | ICD-10-CM | POA: Insufficient documentation

## 2021-12-21 DIAGNOSIS — Z08 Encounter for follow-up examination after completed treatment for malignant neoplasm: Secondary | ICD-10-CM

## 2021-12-21 DIAGNOSIS — I5022 Chronic systolic (congestive) heart failure: Secondary | ICD-10-CM | POA: Insufficient documentation

## 2021-12-21 NOTE — Progress Notes (Incomplete)
?[place]  ?[phone] [phone] ? ?ID: Janice Liu OB: 05-29-1941  MR#: [id number]  CSN#:[id number] ? ?Patient Care Team: ?Jearld Fenton, NP as PCP - General (Internal Medicine) ?Wellington Hampshire, MD as PCP - Cardiology (Cardiology) ?Lloyd Huger, MD as Consulting Physician (Oncology) ?Noreene Filbert, MD as Referring Physician (Radiation Oncology) ?Ronny Bacon, MD as Consulting Physician (General Surgery) ?Rico Junker, RN as Registered Nurse ? 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 ? ?INTERVAL HISTORY: Patient returns to clinic today for routine 39-monthevaluation and discussion of her bone mineral density results.  She continues to tolerate letrozole and Fosamax well without significant side effects.  She currently feels well and is asymptomatic. She has no neurologic complaints.  She denies any recent fevers or illnesses.  She has a good appetite and denies weight loss. She has no chest pain, shortness of breath, cough, or hemoptysis.  She denies any nausea, vomiting, constipation, or diarrhea.  She has no melena or hematochezia.  She has no urinary complaints.  Patient feels at her baseline offers no specific complaints today. ? ?REVIEW OF SYSTEMS:   ?Review of Systems  ?Constitutional: Negative.  Negative for fever, malaise/fatigue and weight loss.  ?Respiratory: Negative.  Negative for cough and shortness of breath.   ?Cardiovascular: Negative.  Negative for chest pain and leg swelling.  ?Gastrointestinal: Negative.  Negative for abdominal pain, blood in stool and melena.  ?Genitourinary: Negative.  Negative for hematuria.  ?Musculoskeletal: Negative.  Negative for back pain.  ?Skin: Negative.  Negative for rash.  ?Neurological: Negative.  Negative for dizziness, focal weakness, weakness and headaches.  ?Psychiatric/Behavioral: Negative.  The patient is not  nervous/anxious.   ? ?As per HPI. Otherwise, a complete review of systems is negative. ? ?PAST MEDICAL HISTORY: ?Past Medical History:  ?Diagnosis Date  ?? Allergy   ?? Breast cancer ([place]   ? 2021  ?? CAD (coronary artery disease)   ?? Chicken pox   ?? [place]   ?? Heart murmur   ?? HFrEF (heart failure with reduced ejection fraction) ([place]   ?? Hypertension   ?? Personal history of radiation therapy   ? ? ?PAST SURGICAL HISTORY: ?Past Surgical History:  ?Procedure Laterality Date  ?? BREAST BIOPSY Right 11/04/2019  ? Affirm Bx- positive- Ribbon Clip  ?? BREAST BIOPSY Left 11/04/2019  ? Affirm Bx- neg- Coil Clip  ?? BREAST BIOPSY Left 11/12/2019  ? uKoreabx/ ribbon clip/ path pending  ?? BREAST CYST ASPIRATION Left 11/12/2019  ? 2 areas  ?? BREAST LUMPECTOMY Right 12/10/2019  ? Procedure: BREAST LUMPECTOMY;  Surgeon: RRonny Bacon MD;  Location: [facility];  Service: General;  Laterality: Right;  ?? CARDIAC CATHETERIZATION    ?? CORONARY STENT INTERVENTION N/A 08/09/2019  ? Procedure: CORONARY STENT INTERVENTION;  Surgeon: ENelva Bush MD;  [facility];  Service: Cardiovascular;  Laterality: N/A;  ?? LEFT HEART CATH AND CORONARY ANGIOGRAPHY N/A 08/09/2019  ? Procedure: LEFT HEART CATH AND CORONARY ANGIOGRAPHY;  Surgeon: ENelva Bush MD;  [facility];  Service: Cardiovascular;  Laterality: N/A;  ? ? ?FAMILY HISTORY: ?Family History  ?Problem Relation Age of Onset  ?? Heart failure Mother   ?? Diabetes Mother   ?? Hypertension Mother   ?? Congestive Heart Failure Brother   ?? Hypertension Brother   ?? Cerebral palsy Son   ?? Congestive Heart Failure Brother   ??  Breast cancer Sister 41  ?? Thyroid cancer Sister   ? ? ?ADVANCED DIRECTIVES (Y/N):  N ? ?HEALTH MAINTENANCE: ?Social History  ? ?Tobacco Use  ?? Smoking status: Never  ?? Smokeless tobacco: Never  ?Vaping Use  ?? Vaping Use: Never used  ?Substance Use Topics  ?? Alcohol use: No  ?  Alcohol/week:  0.0 standard drinks  ?? Drug use: No  ? ? ? Colonoscopy: ? PAP: ? Bone density: ? Lipid panel: ? ?No Known Allergies ? ?Current Outpatient Medications  ?Medication Sig Dispense Refill  ?? acetaminophen (TYLENOL) 500 MG tablet Take 500-1,000 mg by mouth every 6 (six) hours as needed (for pain.).    ?? alendronate (FOSAMAX) 70 MG tablet TAKE 1 TABLET BY MOUTH ONCE A WEEK. TAKE WITH A FULL GLASS OF WATER ON AN EMPTY STOMACH. 12 tablet 3  ?? aspirin EC 81 MG tablet Take 1 tablet (81 mg total) by mouth daily. Swallow whole.    ?? calcium carbonate (OS-CAL - DOSED IN MG OF ELEMENTAL CALCIUM) 1250 (500 Ca) MG tablet Take 500 mg by mouth daily.     ?? carvedilol (COREG) 25 MG tablet TAKE 1 TABLET BY MOUTH TWICE A DAY 180 tablet 0  ?? cyanocobalamin 2000 MCG tablet Take 2,000 mcg by mouth daily.    ?? fenofibrate (TRICOR) 145 MG tablet Take 1 tablet (145 mg total) by mouth daily. 90 tablet 1  ?? ferrous sulfate 325 (65 FE) MG tablet TAKE 1 TABLET BY MOUTH EVERY DAY 90 tablet 0  ?? letrozole (FEMARA) 2.5 MG tablet TAKE 1 TABLET BY MOUTH EVERY DAY 90 tablet 3  ?? losartan (COZAAR) 100 MG tablet TAKE 1 TABLET BY MOUTH EVERY DAY. STOP AMLODIPINE 90 tablet 0  ?? Omega-3 Fatty Acids (FISH OIL) 500 MG CAPS Take 500 mg by mouth daily.    ?? rosuvastatin (CRESTOR) 10 MG tablet TAKE 1 TABLET BY MOUTH EVERY DAY 90 tablet 0  ?? spironolactone (ALDACTONE) 25 MG tablet TAKE 1 TABLET (25 MG TOTAL) BY MOUTH DAILY. 90 tablet 1  ?? FLUZONE HIGH-DOSE QUADRIVALENT 0.7 ML SUSY     ? ?No current facility-administered medications for this visit.  ? ? ?OBJECTIVE: ?Vitals:  ? 12/21/21 1052  ?BP: 130/70  ?Pulse: 81  ?Resp: 16  ?Temp: (!) 97.2 ?F (36.2 ?C)  ?SpO2: 98%  ?   Body mass index is 28.15 kg/m?Marland Kitchen    ECOG FS:0 - Asymptomatic ? ?General: Well-developed, well-nourished, no acute distress. ?Eyes: Pink conjunctiva, anicteric sclera. ?HEENT: Normocephalic, moist mucous membranes. ?Breast: Exam deferred today. ?Lungs: No audible wheezing or  coughing. ?Heart: Regular rate and rhythm. ?Abdomen: Soft, nontender, no obvious distention. ?Musculoskeletal: No edema, cyanosis, or clubbing. ?Neuro: Alert, answering all questions appropriately. Cranial nerves grossly intact. ?Skin: No rashes or petechiae noted. ?Psych: Normal affect. ? ?LAB RESULTS: ? ?Lab Results  ?Component Value Date  ? NA 140 09/18/2021  ? K 4.4 09/18/2021  ? CL 103 09/18/2021  ? CO2 26 09/18/2021  ? GLUCOSE 115 (H) 09/18/2021  ? BUN 23 09/18/2021  ? CREATININE 1.10 (H) 09/18/2021  ? CALCIUM 10.3 09/18/2021  ? PROT 7.4 09/18/2021  ? ALBUMIN 4.5 03/09/2020  ? AST 17 09/18/2021  ? ALT 14 09/18/2021  ? ALKPHOS 63 03/09/2020  ? BILITOT 0.7 09/18/2021  ? GFRNONAA 49 (L) 12/08/2019  ? GFRAA 57 (L) 12/08/2019  ? ? ?Lab Results  ?Component Value Date  ? WBC 9.5 09/18/2021  ? NEUTROABS 5.8 10/20/2020  ? HGB 12.9 09/18/2021  ?  HCT 38.1 09/18/2021  ? MCV 89.9 09/18/2021  ? PLT 400 09/18/2021  ? ?Lab Results  ?Component Value Date  ? IRON 74 10/20/2020  ? TIBC 363 10/20/2020  ? IRONPCTSAT 20 10/20/2020  ? ?Lab Results  ?Component Value Date  ? FERRITIN 147 10/20/2020  ? ? ? ?STUDIES: ?MM DIAG BREAST TOMO BILATERAL ? ?Result Date: 12/17/2021 ?CLINICAL DATA:  History of right breast cancer status post lumpectomy in 2021. EXAM: DIGITAL DIAGNOSTIC BILATERAL MAMMOGRAM WITH TOMOSYNTHESIS AND CAD TECHNIQUE: Bilateral digital diagnostic mammography and breast tomosynthesis was performed. The images were evaluated with computer-aided detection. COMPARISON:  Previous exam(s). ACR Breast Density Category b: There are scattered areas of fibroglandular density. FINDINGS: Stable lumpectomy changes are seen in the right breast. No suspicious mass or malignant type microcalcifications identified in either breast. IMPRESSION: No evidence of malignancy in either breast. RECOMMENDATION: Bilateral screening mammogram in 1 year is recommended. I have discussed the findings and recommendations with the patient. If applicable,  a reminder letter will be sent to the patient regarding the next appointment. BI-RADS CATEGORY  2: Benign. Electronically Signed   By: Lillia Mountain M.D.   On: 12/17/2021 13:36  ? ?ASSESSMENT:Stage Ib ER/PR positiv

## 2021-12-21 NOTE — Progress Notes (Unsigned)
Pt in for follow up, states has mammogram and "it came back good".  Pt states she gets " short winded when walking short distance". ?

## 2022-01-01 ENCOUNTER — Encounter: Payer: Self-pay | Admitting: Cardiovascular Disease

## 2022-01-01 ENCOUNTER — Ambulatory Visit: Payer: Medicare HMO | Admitting: Cardiovascular Disease

## 2022-01-01 VITALS — BP 140/80 | HR 81 | Ht 63.0 in | Wt 156.5 lb

## 2022-01-01 DIAGNOSIS — E785 Hyperlipidemia, unspecified: Secondary | ICD-10-CM | POA: Diagnosis not present

## 2022-01-01 DIAGNOSIS — I10 Essential (primary) hypertension: Secondary | ICD-10-CM

## 2022-01-01 DIAGNOSIS — I2510 Atherosclerotic heart disease of native coronary artery without angina pectoris: Secondary | ICD-10-CM | POA: Diagnosis not present

## 2022-01-01 DIAGNOSIS — I5022 Chronic systolic (congestive) heart failure: Secondary | ICD-10-CM

## 2022-01-01 NOTE — Progress Notes (Unsigned)
Cardiology Office Note   Date:  01/02/2022   ID:  Janice Liu, DOB 03/13/1941, MRN [id number]  PCP:  Janice Fenton, NP  Cardiologist:   Kathlyn Sacramento, MD   Chief Complaint  Patient presents with   Other    6 Month f/u c/o sob with long distance walks. Meds reviewed verbally with pt.      History of Present Illness: Janice Liu is a 81 y.o. female who presents for a follow-up visit regarding coronary artery disease and chronic systolic heart failure.  She has chronic medical conditions that include essential hypertension, hyperlipidemia and type 2 diabetes. She presented in December 2020 with non-ST elevation myocardial infarction.  Peak troponin was greater than 27,000.  Cardiac catheterization showed severe one-vessel coronary artery disease with 90% ulcerative stenosis in the mid LAD and moderate proximal LAD and distal RCA disease.  Ejection fraction was 40 to 45% with mildly elevated left ventricular end-diastolic pressure.  She underwent successful angioplasty and drug-eluting stent placement to the mid LAD.  She had a follow-up echocardiogram in March, 2021 that showed improvement in ejection fraction to 50%.  She underwent right lumpectomy in April 2021 for breast cancer.  During last visit, I stopped amlodipine and increased losartan.  She has been doing well and denies any chest pain.  She reports stable exertional dyspnea.  Past Medical History:  Diagnosis Date   Allergy    Breast cancer ([place])    2021   CAD (coronary artery disease)    Chicken pox    Diabetes mellitus ([place])    Heart murmur    HFrEF (heart failure with reduced ejection fraction) ([place])    Hypertension    Personal history of radiation therapy     Past Surgical History:  Procedure Laterality Date   BREAST BIOPSY Right 11/04/2019   Affirm Bx- positive- Ribbon Clip   BREAST BIOPSY Left 11/04/2019   Affirm Bx- neg- Coil Clip   BREAST BIOPSY Left 11/12/2019   [place] bx/ ribbon clip/ path pending    BREAST CYST ASPIRATION Left 11/12/2019   2 areas   BREAST LUMPECTOMY Right 12/10/2019   Procedure: BREAST LUMPECTOMY;  Surgeon: Ronny Bacon, MD;  Location: [facility] [facility];  Service: General;  Laterality: Right;   CARDIAC CATHETERIZATION     CORONARY STENT INTERVENTION N/A 08/09/2019   Procedure: CORONARY STENT INTERVENTION;  Surgeon: Nelva Bush, MD;  Location: [place] [facility];  Service: Cardiovascular;  Laterality: N/A;   LEFT HEART CATH AND CORONARY ANGIOGRAPHY N/A 08/09/2019   Procedure: LEFT HEART CATH AND CORONARY ANGIOGRAPHY;  Surgeon: Nelva Bush, MD;  Location: [facility];  Service: Cardiovascular;  Laterality: N/A;     Current Outpatient Medications  Medication Sig Dispense Refill   acetaminophen (TYLENOL) 500 MG tablet Take 500-1,000 mg by mouth every 6 (six) hours as needed (for pain.).     alendronate (FOSAMAX) 70 MG tablet TAKE 1 TABLET BY MOUTH ONCE A WEEK. TAKE WITH A FULL GLASS OF WATER ON AN EMPTY STOMACH. 12 tablet 3   aspirin EC 81 MG tablet Take 1 tablet (81 mg total) by mouth daily. Swallow whole.     calcium carbonate (OS-CAL - DOSED IN MG OF ELEMENTAL CALCIUM) 1250 (500 Ca) MG tablet Take 500 mg by mouth daily.      carvedilol (COREG) 25 MG tablet TAKE 1 TABLET BY MOUTH TWICE A DAY 180 tablet 0   cyanocobalamin 2000 MCG tablet Take 2,000 mcg by mouth daily.  fenofibrate (TRICOR) 145 MG tablet Take 1 tablet (145 mg total) by mouth daily. 90 tablet 1   ferrous sulfate 325 (65 FE) MG tablet TAKE 1 TABLET BY MOUTH EVERY DAY 90 tablet 0   FLUZONE HIGH-DOSE QUADRIVALENT 0.7 ML SUSY      letrozole (FEMARA) 2.5 MG tablet TAKE 1 TABLET BY MOUTH EVERY DAY 90 tablet 3   losartan (COZAAR) 100 MG tablet TAKE 1 TABLET BY MOUTH EVERY DAY. STOP AMLODIPINE 90 tablet 0   Omega-3 Fatty Acids (FISH OIL) 500 MG CAPS Take 500 mg by mouth daily.     rosuvastatin (CRESTOR) 10 MG tablet TAKE 1 TABLET BY MOUTH EVERY DAY 90 tablet 0   spironolactone (ALDACTONE) 25  MG tablet TAKE 1 TABLET (25 MG TOTAL) BY MOUTH DAILY. 90 tablet 1   No current facility-administered medications for this visit.    Allergies:   Patient has no known allergies.    Social History:  The patient  reports that she has never smoked. She has never used smokeless tobacco. She reports that she does not drink alcohol and does not use drugs.   Family History:  The patient's family history includes Breast cancer (age of onset: 12) in her sister; Cerebral palsy in her son; Congestive Heart Failure in her brother and brother; Diabetes in her mother; Heart failure in her mother; Hypertension in her brother and mother; Thyroid cancer in her sister.    ROS:  Please see the history of present illness.   Otherwise, review of systems are positive for none.   All other systems are reviewed and negative.    PHYSICAL EXAM: VS:  BP 140/80 (BP Location: Left Arm, Patient Position: Sitting, Cuff Size: Normal)   Pulse 81   Ht '5\' 3"'$  (1.6 m)   Wt 156 lb 8 oz (71 kg)   SpO2 98%   BMI 27.72 kg/m  , BMI Body mass index is 27.72 kg/m. GEN: Well nourished, well developed, in no acute distress  HEENT: normal  Neck: no JVD, carotid bruits, or masses Cardiac: RRR; no murmurs, rubs, or gallops, mild bilateral leg edema edema  Respiratory:  clear to auscultation bilaterally, normal work of breathing GI: soft, nontender, nondistended, + BS MS: no deformity or atrophy  Skin: warm and dry, no rash Neuro:  Strength and sensation are intact Psych: euthymic mood, full affect   EKG:  EKG is ordered today. The ekg ordered today demonstrates normal sinus rhythm with possible old septal infarct.   Recent Labs: 09/18/2021: ALT 14; BUN 23; Creat 1.10; Hemoglobin 12.9; Platelets 400; Potassium 4.4; Sodium 140    Lipid Panel    Component Value Date/Time   CHOL 150 09/18/2021 1359   CHOL 274 (H) 08/15/2015 0803   TRIG 197 (H) 09/18/2021 1359   HDL 53 09/18/2021 1359   HDL 38 (L) 08/15/2015 0803    CHOLHDL 2.8 09/18/2021 1359   VLDL 31.4 03/09/2020 0949   LDLCALC 70 09/18/2021 1359   LDLDIRECT 87.0 07/01/2019 0916      Wt Readings from Last 3 Encounters:  01/01/22 156 lb 8 oz (71 kg)  12/21/21 158 lb 14.4 oz (72.1 kg)  09/18/21 156 lb (70.8 kg)       ASSESSMENT AND PLAN:  1.  Coronary artery disease involving native coronary arteries without angina: She is doing extremely well at the present time with no anginal symptoms.  Continue medical therapy and aspirin 81 mg daily.  2.  Chronic systolic heart failure due to ischemic  cardiomyopathy: Ejection fraction was 40 to 45% but improved to 50% on most recent echocardiogram.  Continue carvedilol, losartan and spironolactone.  I reviewed her most recent labs which showed stable renal function and electrolytes.  3.  Essential hypertension: Blood pressure is reasonably controlled.  4.  Hyperlipidemia: Currently on rosuvastatin 10 mg daily, fenofibrate and fish oil.  Most recent lipid profile showed an LDL of 34 and triglyceride of 157  5.  Breast cancer: Followed by oncology and currently is on Femara.   Disposition:   Follow-up in 6 months.   Signed,  Kathlyn Sacramento, MD  01/02/2022 12:02 PM    [place]

## 2022-01-01 NOTE — Patient Instructions (Signed)

## 2022-01-12 ENCOUNTER — Other Ambulatory Visit: Payer: Self-pay | Admitting: Internal Medicine

## 2022-01-12 DIAGNOSIS — E7800 Pure hypercholesterolemia, unspecified: Secondary | ICD-10-CM

## 2022-01-14 NOTE — Telephone Encounter (Signed)
Requested Prescriptions  Pending Prescriptions Disp Refills  . rosuvastatin (CRESTOR) 10 MG tablet [Pharmacy Med Name: ROSUVASTATIN CALCIUM 10 MG TAB] 90 tablet 2    Sig: TAKE 1 TABLET BY MOUTH EVERY DAY     Cardiovascular:  Antilipid - Statins 2 Failed - 01/12/2022  3:32 PM      Failed - Cr in normal range and within 360 days    Creat  Date Value Ref Range Status  09/18/2021 1.10 (H) 0.60 - 0.95 mg/dL Final   Creatinine, Urine  Date Value Ref Range Status  09/18/2021 90 20 - 275 mg/dL Final         Failed - Lipid Panel in normal range within the last 12 months    Cholesterol, Total  Date Value Ref Range Status  08/15/2015 274 (H) 100 - 199 mg/dL Final   Cholesterol  Date Value Ref Range Status  09/18/2021 150 <200 mg/dL Final   LDL Cholesterol (Calc)  Date Value Ref Range Status  09/18/2021 70 mg/dL (calc) Final    Comment:    Reference range: <100 . Desirable range <100 mg/dL for primary prevention;   <70 mg/dL for patients with CHD or diabetic patients  with > or = 2 CHD risk factors. Marland Kitchen LDL-C is now calculated using the Martin-Hopkins  calculation, which is a validated novel method providing  better accuracy than the Friedewald equation in the  estimation of LDL-C.  Cresenciano Genre et al. Annamaria Helling. 7262;035(59): 2061-2068  (http://education.[employer].com/faq/FAQ164)    Direct LDL  Date Value Ref Range Status  07/01/2019 87.0 mg/dL Final    Comment:    Optimal:  <100 mg/dLNear or Above Optimal:  100-129 mg/dLBorderline High:  130-159 mg/dLHigh:  160-189 mg/dLVery High:  >190 mg/dL   HDL  Date Value Ref Range Status  09/18/2021 53 > OR = 50 mg/dL Final  08/15/2015 38 (L) >39 mg/dL Final   Triglycerides  Date Value Ref Range Status  09/18/2021 197 (H) <150 mg/dL Final         Passed - Patient is not pregnant      Passed - Valid encounter within last 12 months    Recent Outpatient Visits          3 months ago Type 2 diabetes mellitus without complication,  without long-term current use of insulin ([place])   [facility] [place], Coralie Keens, NP   10 months ago Medicare annual wellness visit, subsequent   [facility] [place], Coralie Keens, NP      Future Appointments            In 2 months Baity, Coralie Keens, NP [facility], [place]   In 5 months Fletcher Anon, Mertie Clause, MD [facility], [place]

## 2022-01-30 ENCOUNTER — Other Ambulatory Visit: Payer: Self-pay | Admitting: Internal Medicine

## 2022-01-30 NOTE — Telephone Encounter (Signed)
Requested Prescriptions  Pending Prescriptions Disp Refills  . ferrous sulfate 325 (65 FE) MG tablet [Pharmacy Med Name: FERROUS SULFATE 325 MG TABLET] 90 tablet 0    Sig: TAKE 1 TABLET BY MOUTH EVERY DAY     Endocrinology:  Minerals - Iron Supplementation Failed - 01/30/2022 11:16 AM      Failed - Fe (serum) in normal range and within 360 days    Iron  Date Value Ref Range Status  10/20/2020 74 27 - 139 ug/dL Final   Iron Saturation  Date Value Ref Range Status  10/20/2020 20 15 - 55 % Final         Failed - Ferritin in normal range and within 360 days    Ferritin  Date Value Ref Range Status  10/20/2020 147 15 - 150 ng/mL Final         Passed - HGB in normal range and within 360 days    Hemoglobin  Date Value Ref Range Status  09/18/2021 12.9 11.7 - 15.5 g/dL Final  10/20/2020 14.5 11.1 - 15.9 g/dL Final         Passed - HCT in normal range and within 360 days    HCT  Date Value Ref Range Status  09/18/2021 38.1 35.0 - 45.0 % Final   Hematocrit  Date Value Ref Range Status  10/20/2020 43.4 34.0 - 46.6 % Final         Passed - RBC in normal range and within 360 days    RBC  Date Value Ref Range Status  09/18/2021 4.24 3.80 - 5.10 Million/uL Final         Passed - Valid encounter within last 12 months    Recent Outpatient Visits          4 months ago Type 2 diabetes mellitus without complication, without long-term current use of insulin ([place])   [facility] [place], Coralie Keens, NP   10 months ago Medicare annual wellness visit, subsequent   [facility] [place], Coralie Keens, NP      Future Appointments            In 1 month Baity, Coralie Keens, NP [facility], [place]   In 5 months Fletcher Anon, Mertie Clause, MD [facility], [place]

## 2022-02-05 ENCOUNTER — Encounter: Payer: Self-pay | Admitting: Oncology

## 2022-02-23 ENCOUNTER — Other Ambulatory Visit: Payer: Self-pay | Admitting: Internal Medicine

## 2022-02-25 NOTE — Telephone Encounter (Signed)
Requested Prescriptions  Pending Prescriptions Disp Refills  . carvedilol (COREG) 25 MG tablet [Pharmacy Med Name: CARVEDILOL 25 MG TABLET] 180 tablet 0    Sig: TAKE 1 TABLET BY MOUTH TWICE A DAY     Cardiovascular: Beta Blockers 3 Failed - 02/23/2022  9:20 AM      Failed - Cr in normal range and within 360 days    Creat  Date Value Ref Range Status  09/18/2021 1.10 (H) 0.60 - 0.95 mg/dL Final   Creatinine, Urine  Date Value Ref Range Status  09/18/2021 90 20 - 275 mg/dL Final         Failed - Last BP in normal range    BP Readings from Last 1 Encounters:  01/01/22 140/80         Passed - AST in normal range and within 360 days    AST  Date Value Ref Range Status  09/18/2021 17 10 - 35 U/L Final         Passed - ALT in normal range and within 360 days    ALT  Date Value Ref Range Status  09/18/2021 14 6 - 29 U/L Final         Passed - Last Heart Rate in normal range    Pulse Readings from Last 1 Encounters:  01/01/22 81         Passed - Valid encounter within last 6 months    Recent Outpatient Visits          5 months ago Type 2 diabetes mellitus without complication, without long-term current use of insulin ([place])   [facility] [place], Coralie Keens, NP   11 months ago Medicare annual wellness visit, subsequent   [facility] [place], Coralie Keens, NP      Future Appointments            In 3 weeks Garnette Gunner, Coralie Keens, NP [facility], [place]   In 4 months Fletcher Anon, Mertie Clause, MD [facility], [place]

## 2022-03-03 ENCOUNTER — Other Ambulatory Visit: Payer: Self-pay | Admitting: Cardiovascular Disease

## 2022-03-18 ENCOUNTER — Other Ambulatory Visit: Payer: Self-pay | Admitting: Cardiovascular Disease

## 2022-03-18 ENCOUNTER — Other Ambulatory Visit: Payer: Self-pay | Admitting: Internal Medicine

## 2022-03-19 NOTE — Telephone Encounter (Signed)
Requested Prescriptions  Pending Prescriptions Disp Refills  . fenofibrate (TRICOR) 145 MG tablet [Pharmacy Med Name: FENOFIBRATE 145 MG TABLET] 90 tablet 1    Sig: TAKE 1 TABLET BY MOUTH EVERY DAY     Cardiovascular:  Antilipid - Fibric Acid Derivatives Failed - 03/18/2022  2:20 AM      Failed - Cr in normal range and within 360 days    Creat  Date Value Ref Range Status  09/18/2021 1.10 (H) 0.60 - 0.95 mg/dL Final   Creatinine, Urine  Date Value Ref Range Status  09/18/2021 90 20 - 275 mg/dL Final         Failed - Lipid Panel in normal range within the last 12 months    Cholesterol, Total  Date Value Ref Range Status  08/15/2015 274 (H) 100 - 199 mg/dL Final   Cholesterol  Date Value Ref Range Status  09/18/2021 150 <200 mg/dL Final   LDL Cholesterol (Calc)  Date Value Ref Range Status  09/18/2021 70 mg/dL (calc) Final    Comment:    Reference range: <100 . Desirable range <100 mg/dL for primary prevention;   <70 mg/dL for patients with CHD or diabetic patients  with > or = 2 CHD risk factors. Marland Kitchen LDL-C is now calculated using the Martin-Hopkins  calculation, which is a validated novel method providing  better accuracy than the Friedewald equation in the  estimation of LDL-C.  Cresenciano Genre et al. Annamaria Helling. 0254;270(62): 2061-2068  (http://education.[facility].com/faq/FAQ164)    Direct LDL  Date Value Ref Range Status  07/01/2019 87.0 mg/dL Final    Comment:    Optimal:  <100 mg/dLNear or Above Optimal:  100-129 mg/dLBorderline High:  130-159 mg/dLHigh:  160-189 mg/dLVery High:  >190 mg/dL   HDL  Date Value Ref Range Status  09/18/2021 53 > OR = 50 mg/dL Final  08/15/2015 38 (L) >39 mg/dL Final   Triglycerides  Date Value Ref Range Status  09/18/2021 197 (H) <150 mg/dL Final         Passed - ALT in normal range and within 360 days    ALT  Date Value Ref Range Status  09/18/2021 14 6 - 29 U/L Final         Passed - AST in normal range and within 360 days     AST  Date Value Ref Range Status  09/18/2021 17 10 - 35 U/L Final         Passed - HGB in normal range and within 360 days    Hemoglobin  Date Value Ref Range Status  09/18/2021 12.9 11.7 - 15.5 g/dL Final  10/20/2020 14.5 11.1 - 15.9 g/dL Final         Passed - HCT in normal range and within 360 days    HCT  Date Value Ref Range Status  09/18/2021 38.1 35.0 - 45.0 % Final   Hematocrit  Date Value Ref Range Status  10/20/2020 43.4 34.0 - 46.6 % Final         Passed - PLT in normal range and within 360 days    Platelets  Date Value Ref Range Status  09/18/2021 400 140 - 400 Thousand/uL Final  10/20/2020 438 150 - 450 x10E3/uL Final         Passed - WBC in normal range and within 360 days    WBC  Date Value Ref Range Status  09/18/2021 9.5 3.8 - 10.8 Thousand/uL Final         Passed - eGFR  is 30 or above and within 360 days    GFR calc Af Amer  Date Value Ref Range Status  12/08/2019 57 (L) >60 mL/min Final   GFR calc non Af Amer  Date Value Ref Range Status  12/08/2019 49 (L) >60 mL/min Final   GFR  Date Value Ref Range Status  07/14/2020 49.90 (L) >60.00 mL/min Final    Comment:    Calculated using the CKD-EPI Creatinine Equation (2021)   eGFR  Date Value Ref Range Status  09/18/2021 51 (L) > OR = 60 mL/min/1.10m Final    Comment:    The eGFR is based on the CKD-EPI 2021 equation. To calculate  the new eGFR from a previous Creatinine or Cystatin C result, go to https://www.kidney.org/professionals/ kdoqi/gfr%5Fcalculator   07/10/2021 55 (L) >59 mL/min/1.73 Final         Passed - Valid encounter within last 12 months    Recent Outpatient Visits          6 months ago Type 2 diabetes mellitus without complication, without long-term current use of insulin ([place]   [facility]   1 year ago Medicare annual wellness visit, subsequent   [facility]      Future Appointments             In 2 days BGarnette Gunner RCoralie Keens NP [facility] [place]  In 3 months AFletcher Anon MMertie Clause MD [facility] [place]

## 2022-03-21 ENCOUNTER — Ambulatory Visit (INDEPENDENT_AMBULATORY_CARE_PROVIDER_SITE_OTHER): Payer: Medicare HMO | Admitting: Internal Medicine

## 2022-03-21 ENCOUNTER — Encounter: Payer: Self-pay | Admitting: Internal Medicine

## 2022-03-21 VITALS — BP 137/79 | HR 81 | Temp 97.3°F | Wt 157.0 lb

## 2022-03-21 DIAGNOSIS — N1831 Chronic kidney disease, stage 3a: Secondary | ICD-10-CM

## 2022-03-21 DIAGNOSIS — Z6827 Body mass index (BMI) 27.0-27.9, adult: Secondary | ICD-10-CM | POA: Diagnosis not present

## 2022-03-21 DIAGNOSIS — E1122 Type 2 diabetes mellitus with diabetic chronic kidney disease: Secondary | ICD-10-CM

## 2022-03-21 DIAGNOSIS — E663 Overweight: Secondary | ICD-10-CM

## 2022-03-21 DIAGNOSIS — L659 Nonscarring hair loss, unspecified: Secondary | ICD-10-CM | POA: Diagnosis not present

## 2022-03-21 DIAGNOSIS — Z23 Encounter for immunization: Secondary | ICD-10-CM | POA: Diagnosis not present

## 2022-03-21 DIAGNOSIS — Z0001 Encounter for general adult medical examination with abnormal findings: Secondary | ICD-10-CM

## 2022-03-21 NOTE — Assessment & Plan Note (Signed)
Encourage diet and exercise for weight loss 

## 2022-03-21 NOTE — Progress Notes (Signed)
Subjective:    Patient ID: Janice Liu, female    DOB: 10-17-1940, 81 y.o.   MRN: [id number]  HPI  Patient presents to clinic today for annual exam.  Flu: 05/2021 Tetanus: > 10 years ago COVID: Pfizer x 5 Pneumovax: Never Prevnar: 03/2021 Shingrix: Never Pap smear: No longer screening Mammogram: 12/2021 Bone density: 03/2021, scheduled 04/2022 Colon screening: never Vision screening: annually Dentist: as needed  Diet: She does eat meat. She consumes fruits and veggies. She tries to avoid fried foods. She drinks mostly water. Exercise: None  Review of Systems     Past Medical History:  Diagnosis Date   Allergy    Breast cancer ([place])    2021   CAD (coronary artery disease)    Chicken pox    Diabetes mellitus (HCC)    Heart murmur    HFrEF (heart failure with reduced ejection fraction) (HCC)    Hypertension    Personal history of radiation therapy     Current Outpatient Medications  Medication Sig Dispense Refill   acetaminophen (TYLENOL) 500 MG tablet Take 500-1,000 mg by mouth every 6 (six) hours as needed (for pain.).     alendronate (FOSAMAX) 70 MG tablet TAKE 1 TABLET BY MOUTH ONCE A WEEK. TAKE WITH A FULL GLASS OF WATER ON AN EMPTY STOMACH. 12 tablet 3   aspirin EC 81 MG tablet Take 1 tablet (81 mg total) by mouth daily. Swallow whole.     calcium carbonate (OS-CAL - DOSED IN MG OF ELEMENTAL CALCIUM) 1250 (500 Ca) MG tablet Take 500 mg by mouth daily.      carvedilol (COREG) 25 MG tablet TAKE 1 TABLET BY MOUTH TWICE A DAY 180 tablet 0   cyanocobalamin 2000 MCG tablet Take 2,000 mcg by mouth daily.     fenofibrate (TRICOR) 145 MG tablet TAKE 1 TABLET BY MOUTH EVERY DAY 90 tablet 1   ferrous sulfate 325 (65 FE) MG tablet TAKE 1 TABLET BY MOUTH EVERY DAY 90 tablet 0   FLUZONE HIGH-DOSE QUADRIVALENT 0.7 ML SUSY      letrozole (FEMARA) 2.5 MG tablet TAKE 1 TABLET BY MOUTH EVERY DAY 90 tablet 3   losartan (COZAAR) 100 MG tablet TAKE 1 TABLET BY MOUTH EVERY DAY. STOP  AMLODIPINE 90 tablet 0   Omega-3 Fatty Acids (FISH OIL) 500 MG CAPS Take 500 mg by mouth daily.     rosuvastatin (CRESTOR) 10 MG tablet TAKE 1 TABLET BY MOUTH EVERY DAY 90 tablet 2   spironolactone (ALDACTONE) 25 MG tablet TAKE 1 TABLET (25 MG TOTAL) BY MOUTH DAILY. 90 tablet 0   No current facility-administered medications for this visit.    No Known Allergies  Family History  Problem Relation Age of Onset   Heart failure Mother    Diabetes Mother    Hypertension Mother    Congestive Heart Failure Brother    Hypertension Brother    Cerebral palsy Son    Congestive Heart Failure Brother    Breast cancer Sister 47   Thyroid cancer Sister     Social History   Socioeconomic History   Marital status: Widowed    Spouse name: Not on file   Number of children: Not on file   Years of education: Not on file   Highest education level: Not on file  Occupational History   Not on file  Tobacco Use   Smoking status: Never   Smokeless tobacco: Never  Vaping Use   Vaping Use: Never used  Substance and Sexual Activity   Alcohol use: No    Alcohol/week: 0.0 standard drinks of alcohol   Drug use: No   Sexual activity: Never  Other Topics Concern   Not on file  Social History Narrative   Not on file   Social Determinants of Health   Financial Resource Strain: Not on file  Food Insecurity: Not on file  Transportation Needs: Not on file  Physical Activity: Not on file  Stress: Not on file  Social Connections: Not on file  Intimate Partner Violence: Not on file     Constitutional: Denies fever, malaise, fatigue, headache or abrupt weight changes.  HEENT: Denies eye pain, eye redness, ear pain, ringing in the ears, wax buildup, runny nose, nasal congestion, bloody nose, or sore throat. Respiratory: Denies difficulty breathing, shortness of breath, cough or sputum production.   Cardiovascular: Denies chest pain, chest tightness, palpitations or swelling in the hands or feet.   Gastrointestinal: Denies abdominal pain, bloating, constipation, diarrhea or blood in the stool.  GU: Denies urgency, frequency, pain with urination, burning sensation, blood in urine, odor or discharge. Musculoskeletal: Denies decrease in range of motion, difficulty with gait, muscle pain or joint pain and swelling.  Skin: Denies redness, rashes, lesions or ulcercations.  Neurological: Patient reports intermittent numbness in hands.  Denies dizziness, difficulty with memory, difficulty with speech or problems with balance and coordination.  Psych: Denies anxiety, depression, SI/HI.  No other specific complaints in a complete review of systems (except as listed in HPI above).  Objective:   Physical Exam  BP 137/79   Pulse 81   Temp (!) 97.3 F (36.3 C) (Temporal)   Wt 157 lb (71.2 kg)   SpO2 98%   BMI 27.81 kg/m   Wt Readings from Last 3 Encounters:  01/01/22 156 lb 8 oz (71 kg)  12/21/21 158 lb 14.4 oz (72.1 kg)  09/18/21 156 lb (70.8 kg)    General: Appears her stated age, overweight, in NAD. Skin: Warm, dry and intact.  HEENT: Head: normal shape and size; Eyes: sclera white, no icterus, conjunctiva pink, PERRLA and EOMs intact;  Neck:  Neck supple, trachea midline. No masses, lumps or thyromegaly present.  Cardiovascular: Normal rate and rhythm. S1,S2 noted.  No murmur, rubs or gallops noted. No JVD or BLE edema. No carotid bruits noted. Pulmonary/Chest: Normal effort and positive vesicular breath sounds. No respiratory distress. No wheezes, rales or ronchi noted.  Abdomen: Normal bowel sounds. Musculoskeletal: Kyphotic.  Strength 5/5 BUE/BLE.  No difficulty with gait.  Neurological: Alert and oriented. Cranial nerves II-XII grossly intact. Coordination normal.  Psychiatric: Mood and affect normal. Behavior is normal. Judgment and thought content normal.     BMET    Component Value Date/Time   NA 140 09/18/2021 1359   NA 148 (H) 07/10/2021 0938   K 4.4 09/18/2021  1359   CL 103 09/18/2021 1359   CO2 26 09/18/2021 1359   GLUCOSE 115 (H) 09/18/2021 1359   BUN 23 09/18/2021 1359   BUN 20 07/10/2021 0938   CREATININE 1.10 (H) 09/18/2021 1359   CALCIUM 10.3 09/18/2021 1359   GFRNONAA 49 (L) 12/08/2019 1113   GFRAA 57 (L) 12/08/2019 1113    Lipid Panel     Component Value Date/Time   CHOL 150 09/18/2021 1359   CHOL 274 (H) 08/15/2015 0803   TRIG 197 (H) 09/18/2021 1359   HDL 53 09/18/2021 1359   HDL 38 (L) 08/15/2015 0803   CHOLHDL 2.8 09/18/2021 1359  VLDL 31.4 03/09/2020 0949   LDLCALC 70 09/18/2021 1359    CBC    Component Value Date/Time   WBC 9.5 09/18/2021 1359   RBC 4.24 09/18/2021 1359   HGB 12.9 09/18/2021 1359   HGB 14.5 10/20/2020 1521   HCT 38.1 09/18/2021 1359   HCT 43.4 10/20/2020 1521   PLT 400 09/18/2021 1359   PLT 438 10/20/2020 1521   MCV 89.9 09/18/2021 1359   MCV 90 10/20/2020 1521   MCH 30.4 09/18/2021 1359   MCHC 33.9 09/18/2021 1359   RDW 12.7 09/18/2021 1359   RDW 11.9 10/20/2020 1521   LYMPHSABS 2.5 10/20/2020 1521   MONOABS 0.6 12/08/2019 1113   EOSABS 0.2 10/20/2020 1521   BASOSABS 0.0 10/20/2020 1521    Hgb A1C Lab Results  Component Value Date   HGBA1C 6.4 (H) 09/18/2021            Assessment & Plan:   Preventative Health Maintenance:  Encouraged her to get a flu shot in the fall She declines tetanus for financial reasons, advised if she gets bit or cut to go get this done. COVID-vaccine UTD Prevnar UTD Pneumovax today Discussed Shingrix vaccine, she will check coverage with her insurance company and get this done at the pharmacy if she would like to have this done She no longer needs to screen for cervical cancer Mammogram UTD Bone density scheduled She declines colon cancer screening given her age Encouraged her to consume a balanced diet and exercise regimen Advised her to see an eye doctor and dentist annually Will check CBC, c-Met, lipid, A1c today  RTC in 6 months,  follow-up chronic conditions Webb Silversmith, NP

## 2022-03-21 NOTE — Patient Instructions (Signed)
Health Maintenance for Postmenopausal Women Menopause is a normal process in which your ability to get pregnant comes to an end. This process happens slowly over many months or years, usually between the ages of 48 and 55. Menopause is complete when you have missed your menstrual period for 12 months. It is important to talk with your health care provider about some of the most common conditions that affect women after menopause (postmenopausal women). These include heart disease, cancer, and bone loss (osteoporosis). Adopting a healthy lifestyle and getting preventive care can help to promote your health and wellness. The actions you take can also lower your chances of developing some of these common conditions. What are the signs and symptoms of menopause? During menopause, you may have the following symptoms: Hot flashes. These can be moderate or severe. Night sweats. Decrease in sex drive. Mood swings. Headaches. Tiredness (fatigue). Irritability. Memory problems. Problems falling asleep or staying asleep. Talk with your health care provider about treatment options for your symptoms. Do I need hormone replacement therapy? Hormone replacement therapy is effective in treating symptoms that are caused by menopause, such as hot flashes and night sweats. Hormone replacement carries certain risks, especially as you become older. If you are thinking about using estrogen or estrogen with progestin, discuss the benefits and risks with your health care provider. How can I reduce my risk for heart disease and stroke? The risk of heart disease, heart attack, and stroke increases as you age. One of the causes may be a change in the body's hormones during menopause. This can affect how your body uses dietary fats, triglycerides, and cholesterol. Heart attack and stroke are medical emergencies. There are many things that you can do to help prevent heart disease and stroke. Watch your blood pressure High  blood pressure causes heart disease and increases the risk of stroke. This is more likely to develop in people who have high blood pressure readings or are overweight. Have your blood pressure checked: Every 3-5 years if you are 18-39 years of age. Every year if you are 40 years old or older. Eat a healthy diet  Eat a diet that includes plenty of vegetables, fruits, low-fat dairy products, and lean protein. Do not eat a lot of foods that are high in solid fats, added sugars, or sodium. Get regular exercise Get regular exercise. This is one of the most important things you can do for your health. Most adults should: Try to exercise for at least 150 minutes each week. The exercise should increase your heart rate and make you sweat (moderate-intensity exercise). Try to do strengthening exercises at least twice each week. Do these in addition to the moderate-intensity exercise. Spend less time sitting. Even light physical activity can be beneficial. Other tips Work with your health care provider to achieve or maintain a healthy weight. Do not use any products that contain nicotine or tobacco. These products include cigarettes, chewing tobacco, and vaping devices, such as e-cigarettes. If you need help quitting, ask your health care provider. Know your numbers. Ask your health care provider to check your cholesterol and your blood sugar (glucose). Continue to have your blood tested as directed by your health care provider. Do I need screening for cancer? Depending on your health history and family history, you may need to have cancer screenings at different stages of your life. This may include screening for: Breast cancer. Cervical cancer. Lung cancer. Colorectal cancer. What is my risk for osteoporosis? After menopause, you may be   at increased risk for osteoporosis. Osteoporosis is a condition in which bone destruction happens more quickly than new bone creation. To help prevent osteoporosis or  the bone fractures that can happen because of osteoporosis, you may take the following actions: If you are 19-50 years old, get at least 1,000 mg of calcium and at least 600 international units (IU) of vitamin D per day. If you are older than age 50 but younger than age 70, get at least 1,200 mg of calcium and at least 600 international units (IU) of vitamin D per day. If you are older than age 70, get at least 1,200 mg of calcium and at least 800 international units (IU) of vitamin D per day. Smoking and drinking excessive alcohol increase the risk of osteoporosis. Eat foods that are rich in calcium and vitamin D, and do weight-bearing exercises several times each week as directed by your health care provider. How does menopause affect my mental health? Depression may occur at any age, but it is more common as you become older. Common symptoms of depression include: Feeling depressed. Changes in sleep patterns. Changes in appetite or eating patterns. Feeling an overall lack of motivation or enjoyment of activities that you previously enjoyed. Frequent crying spells. Talk with your health care provider if you think that you are experiencing any of these symptoms. General instructions See your health care provider for regular wellness exams and vaccines. This may include: Scheduling regular health, dental, and eye exams. Getting and maintaining your vaccines. These include: Influenza vaccine. Get this vaccine each year before the flu season begins. Pneumonia vaccine. Shingles vaccine. Tetanus, diphtheria, and pertussis (Tdap) booster vaccine. Your health care provider may also recommend other immunizations. Tell your health care provider if you have ever been abused or do not feel safe at home. Summary Menopause is a normal process in which your ability to get pregnant comes to an end. This condition causes hot flashes, night sweats, decreased interest in sex, mood swings, headaches, or lack  of sleep. Treatment for this condition may include hormone replacement therapy. Take actions to keep yourself healthy, including exercising regularly, eating a healthy diet, watching your weight, and checking your blood pressure and blood sugar levels. Get screened for cancer and depression. Make sure that you are up to date with all your vaccines. This information is not intended to replace advice given to you by your health care provider. Make sure you discuss any questions you have with your health care provider. Document Revised: 12/18/2020 Document Reviewed: 12/18/2020 Elsevier Patient Education  2023 Elsevier Inc.  

## 2022-03-22 LAB — CBC
HCT: 37.7 % (ref 35.0–45.0)
Hemoglobin: 12.6 g/dL (ref 11.7–15.5)
MCH: 30.9 pg (ref 27.0–33.0)
MCHC: 33.4 g/dL (ref 32.0–36.0)
MCV: 92.4 fL (ref 80.0–100.0)
MPV: 10 fL (ref 7.5–12.5)
Platelets: 429 10*3/uL — ABNORMAL HIGH (ref 140–400)
RBC: 4.08 10*6/uL (ref 3.80–5.10)
RDW: 12.1 % (ref 11.0–15.0)
WBC: 8.3 10*3/uL (ref 3.8–10.8)

## 2022-03-22 LAB — COMPLETE METABOLIC PANEL WITH GFR
AG Ratio: 1.6 (calc) (ref 1.0–2.5)
ALT: 15 U/L (ref 6–29)
AST: 17 U/L (ref 10–35)
Albumin: 4.4 g/dL (ref 3.6–5.1)
Alkaline phosphatase (APISO): 31 U/L — ABNORMAL LOW (ref 37–153)
BUN/Creatinine Ratio: 25 (calc) — ABNORMAL HIGH (ref 6–22)
BUN: 32 mg/dL — ABNORMAL HIGH (ref 7–25)
CO2: 25 mmol/L (ref 20–32)
Calcium: 9.7 mg/dL (ref 8.6–10.4)
Chloride: 104 mmol/L (ref 98–110)
Creat: 1.26 mg/dL — ABNORMAL HIGH (ref 0.60–0.95)
Globulin: 2.7 g/dL (calc) (ref 1.9–3.7)
Glucose, Bld: 111 mg/dL — ABNORMAL HIGH (ref 65–99)
Potassium: 4.7 mmol/L (ref 3.5–5.3)
Sodium: 138 mmol/L (ref 135–146)
Total Bilirubin: 0.5 mg/dL (ref 0.2–1.2)
Total Protein: 7.1 g/dL (ref 6.1–8.1)
eGFR: 43 mL/min/{1.73_m2} — ABNORMAL LOW (ref >=60)

## 2022-03-22 LAB — HEMOGLOBIN A1C
Hgb A1c MFr Bld: 6.2 % of total Hgb — ABNORMAL HIGH (ref <5.7)
Mean Plasma Glucose: 131 mg/dL
eAG (mmol/L): 7.3 mmol/L

## 2022-03-22 LAB — LIPID PANEL
Cholesterol: 147 mg/dL (ref <200)
HDL: 46 mg/dL — ABNORMAL LOW (ref >=50)
LDL Cholesterol (Calc): 72 mg/dL (calc)
Non-HDL Cholesterol (Calc): 101 mg/dL (calc) (ref <130)
Total CHOL/HDL Ratio: 3.2 (calc) (ref <5.0)
Triglycerides: 192 mg/dL — ABNORMAL HIGH (ref <150)

## 2022-03-22 LAB — TSH: TSH: 3.86 mIU/L (ref 0.40–4.50)

## 2022-03-26 DIAGNOSIS — I509 Heart failure, unspecified: Secondary | ICD-10-CM | POA: Diagnosis not present

## 2022-03-26 DIAGNOSIS — N1831 Chronic kidney disease, stage 3a: Secondary | ICD-10-CM | POA: Diagnosis not present

## 2022-03-26 DIAGNOSIS — D509 Iron deficiency anemia, unspecified: Secondary | ICD-10-CM | POA: Diagnosis not present

## 2022-03-26 DIAGNOSIS — I129 Hypertensive chronic kidney disease with stage 1 through stage 4 chronic kidney disease, or unspecified chronic kidney disease: Secondary | ICD-10-CM | POA: Diagnosis not present

## 2022-03-27 ENCOUNTER — Ambulatory Visit
Admission: RE | Admit: 2022-03-27 | Discharge: 2022-03-27 | Disposition: A | Discharge status: Home / Self Care | Payer: Medicare HMO | Source: Ambulatory Visit | Attending: Radiation Oncology | Admitting: Radiation Oncology

## 2022-03-27 VITALS — BP 151/79 | HR 83 | Temp 97.0°F | Resp 18 | Ht 63.0 in | Wt 157.0 lb

## 2022-03-27 DIAGNOSIS — C50511 Malignant neoplasm of lower-outer quadrant of right female breast: Secondary | ICD-10-CM | POA: Diagnosis not present

## 2022-03-27 DIAGNOSIS — Z923 Personal history of irradiation: Secondary | ICD-10-CM | POA: Diagnosis not present

## 2022-03-27 DIAGNOSIS — Z79811 Long term (current) use of aromatase inhibitors: Secondary | ICD-10-CM | POA: Insufficient documentation

## 2022-03-27 DIAGNOSIS — Z170 Estrogen receptor positive status [ER+]: Secondary | ICD-10-CM | POA: Insufficient documentation

## 2022-03-27 NOTE — Progress Notes (Signed)
Radiation Oncology Follow up Note  Name: Janice Liu   Date:   03/27/2022 MRN:  [id number] DOB: [birth year]    This 81 y.o. 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  We D did treat her axillary lymph nodes since no sentinel node was performed.  Seen today in routine follow-up she is doing well.  She specifically denies breast tenderness cough or bone pain..  She had mammogram back in May which I have reviewed was a BI-RADS 2 benign.  She is currently on letrozole tolerating it well without side effects.  COMPLICATIONS OF TREATMENT: none  FOLLOW UP COMPLIANCE: keeps appointments   PHYSICAL EXAM:  BP (!) 151/79   Pulse 83   Temp (!) 97 F (36.1 C)   Resp 18   Ht '5\' 3"'$  (1.6 m)   Wt 157 lb (71.2 kg)   BMI 27.81 kg/m  Lungs are clear to A&P cardiac examination essentially unremarkable with regular rate and rhythm. No dominant mass or nodularity is noted in either breast in 2 positions examined. Incision is well-healed. No axillary or supraclavicular adenopathy is appreciated. Cosmetic result is excellent.  Well-developed well-nourished patient in NAD. HEENT reveals PERLA, EOMI, discs not visualized.  Oral cavity is clear. No oral mucosal lesions are identified. Neck is clear without evidence of cervical or supraclavicular adenopathy. Lungs are clear to A&P. Cardiac examination is essentially unremarkable with regular rate and rhythm without murmur rub or thrill. Abdomen is benign with no organomegaly or masses noted. Motor sensory and DTR levels are equal and symmetric in the upper and lower extremities. Cranial nerves II through XII are grossly intact. Proprioception is intact. No peripheral  adenopathy or edema is identified. No motor or sensory levels are noted. Crude visual fields are within normal range.  RADIOLOGY RESULTS: Mammograms reviewed compatible with above-stated findings  PLAN: Present time patient is doing well with no evidence of disease 2 years out and pleased with her overall progress.  Of asked to see her back in 1 year for follow-up.  I will discontinue follow-up care at that time.  Patient knows to call with any concerns.  I would like to take this opportunity to thank you for allowing me to participate in the care of your patient.Noreene Filbert, MD

## 2022-04-17 ENCOUNTER — Ambulatory Visit
Admission: RE | Admit: 2022-04-17 | Discharge: 2022-04-17 | Disposition: A | Discharge status: Home / Self Care | Payer: Medicare HMO | Source: Ambulatory Visit | Attending: Nurse Practitioner | Admitting: Nurse Practitioner

## 2022-04-17 DIAGNOSIS — M8588 Other specified disorders of bone density and structure, other site: Secondary | ICD-10-CM | POA: Diagnosis not present

## 2022-04-17 DIAGNOSIS — Z780 Asymptomatic menopausal state: Secondary | ICD-10-CM | POA: Diagnosis not present

## 2022-04-17 DIAGNOSIS — M810 Age-related osteoporosis without current pathological fracture: Secondary | ICD-10-CM | POA: Diagnosis not present

## 2022-04-26 ENCOUNTER — Other Ambulatory Visit: Payer: Self-pay | Admitting: Internal Medicine

## 2022-04-29 ENCOUNTER — Telehealth: Payer: Self-pay | Admitting: Internal Medicine

## 2022-04-29 NOTE — Telephone Encounter (Signed)
Left message for patient to call back and schedule the Medicare Annual Wellness Visit (AWV) virtually or by telephone.  Last AWV 03/15/21  Please schedule at anytime with [place].    Any questions, please call me at [phone]

## 2022-04-29 NOTE — Telephone Encounter (Signed)
Requested Prescriptions  Pending Prescriptions Disp Refills  . ferrous sulfate 325 (65 FE) MG tablet [Pharmacy Med Name: FERROUS SULFATE 325 MG TABLET] 90 tablet 0    Sig: TAKE 1 TABLET BY MOUTH EVERY DAY     Endocrinology:  Minerals - Iron Supplementation Failed - 04/26/2022  6:38 PM      Failed - Fe (serum) in normal range and within 360 days    Iron  Date Value Ref Range Status  10/20/2020 74 27 - 139 ug/dL Final   Iron Saturation  Date Value Ref Range Status  10/20/2020 20 15 - 55 % Final         Failed - Ferritin in normal range and within 360 days    Ferritin  Date Value Ref Range Status  10/20/2020 147 15 - 150 ng/mL Final         Passed - HGB in normal range and within 360 days    Hemoglobin  Date Value Ref Range Status  03/21/2022 12.6 11.7 - 15.5 g/dL Final  10/20/2020 14.5 11.1 - 15.9 g/dL Final         Passed - HCT in normal range and within 360 days    HCT  Date Value Ref Range Status  03/21/2022 37.7 35.0 - 45.0 % Final   Hematocrit  Date Value Ref Range Status  10/20/2020 43.4 34.0 - 46.6 % Final         Passed - RBC in normal range and within 360 days    RBC  Date Value Ref Range Status  03/21/2022 4.08 3.80 - 5.10 Million/uL Final         Passed - Valid encounter within last 12 months    Recent Outpatient Visits          1 month ago Encounter for general adult medical examination with abnormal findings   [place], Coralie Keens, NP   7 months ago Type 2 diabetes mellitus without complication, without long-term current use of insulin ([place])   [facility] [place], Coralie Keens, NP   1 year ago Medicare annual wellness visit, subsequent   [facility] [place], Coralie Keens, NP      Future Appointments            In 2 months Fletcher Anon, Mertie Clause, MD [place] [place]   In 5 months Baity, Coralie Keens, NP [facility]

## 2022-05-27 ENCOUNTER — Ambulatory Visit (INDEPENDENT_AMBULATORY_CARE_PROVIDER_SITE_OTHER): Payer: Medicare HMO

## 2022-05-27 VITALS — BP 158/73 | HR 81 | Temp 97.7°F | Resp 17 | Ht 61.5 in | Wt 160.0 lb

## 2022-05-27 DIAGNOSIS — Z0000 Encounter for general adult medical examination without abnormal findings: Secondary | ICD-10-CM | POA: Diagnosis not present

## 2022-05-27 NOTE — Patient Instructions (Signed)

## 2022-05-27 NOTE — Progress Notes (Unsigned)
Subjective:   Janice Liu is a 81 y.o. female who presents for Medicare Annual (Subsequent) preventive examination.  Review of Systems    Per HPI unless specifically indicated below.  Cardiac Risk Factors include: advanced age (>43mn, >>52women);female gender        Objective:    Today's Vitals   05/27/22 1401 05/27/22 1409  BP: (!) 147/71 (!) 158/73  Pulse: 81   Resp: 17   Temp: 97.7 F (36.5 C)   TempSrc: Temporal   SpO2: 97%   Weight: 160 lb (72.6 kg)   Height: 5' 1.5" (1.562 m)   PainSc: 0-No pain    Body mass index is 29.74 kg/m.     12/21/2021   10:40 AM 06/12/2021   11:04 AM 03/28/2021    2:23 PM 06/08/2020   10:45 AM 03/22/2020   10:15 AM 12/10/2019    6:09 AM 12/06/2019    9:55 AM  Advanced Directives  Does Patient Have a Medical Advance Directive? No Yes Yes No Yes Yes No  Type of ACorporate treasurerof ARalstonLiving will;Out of facility DNR (pink MOST or yellow form) [place] will  [place]Living will [place]Living will   Does patient want to make changes to medical advance directive?  No - Patient declined    No - Patient declined   Copy of HA[place] Chart?  No - copy requested No - copy requested  No - copy requested No - copy requested   Would patient like information on creating a medical advance directive?  No - Patient declined No - Patient declined  No - Patient declined No - Patient declined No - Patient declined    Current Medications (verified) Outpatient Encounter Medications as of 05/27/2022  Medication Sig   acetaminophen (TYLENOL) 500 MG tablet Take 500-1,000 mg by mouth every 6 (six) hours as needed (for pain.).   alendronate (FOSAMAX) 70 MG tablet TAKE 1 TABLET BY MOUTH ONCE A WEEK. TAKE WITH A FULL GLASS OF WATER ON AN EMPTY STOMACH.   aspirin EC 81 MG tablet Take 1 tablet (81 mg total) by mouth daily. Swallow whole.   calcium  carbonate (OS-CAL - DOSED IN MG OF ELEMENTAL CALCIUM) 1250 (500 Ca) MG tablet Take 500 mg by mouth daily.    carvedilol (COREG) 25 MG tablet TAKE 1 TABLET BY MOUTH TWICE A DAY   fenofibrate (TRICOR) 145 MG tablet TAKE 1 TABLET BY MOUTH EVERY DAY   ferrous sulfate 325 (65 FE) MG tablet TAKE 1 TABLET BY MOUTH EVERY DAY   letrozole (FEMARA) 2.5 MG tablet TAKE 1 TABLET BY MOUTH EVERY DAY   losartan (COZAAR) 100 MG tablet TAKE 1 TABLET BY MOUTH EVERY DAY. STOP AMLODIPINE   Omega-3 Fatty Acids (FISH OIL) 500 MG CAPS Take 500 mg by mouth daily.   rosuvastatin (CRESTOR) 10 MG tablet TAKE 1 TABLET BY MOUTH EVERY DAY   spironolactone (ALDACTONE) 25 MG tablet TAKE 1 TABLET (25 MG TOTAL) BY MOUTH DAILY.   cyanocobalamin 2000 MCG tablet Take 2,000 mcg by mouth daily. (Patient not taking: Reported on 05/27/2022)   No facility-administered encounter medications on file as of 05/27/2022.    Allergies (verified) Patient has no known allergies.   History: Past Medical History:  Diagnosis Date   Allergy    [place]    [place]   CAD (coronary artery disease)    Chicken pox  Diabetes mellitus ([place])    Heart murmur    HFrEF (heart failure with reduced ejection fraction) ([place])    Hypertension    Personal history of radiation therapy    Past Surgical History:  Procedure Laterality Date   BREAST BIOPSY Right 11/04/2019   Affirm Bx- positive- Ribbon Clip   BREAST BIOPSY Left 11/04/2019   Affirm Bx- neg- Coil Clip   BREAST BIOPSY Left 11/12/2019   [place] bx/ ribbon clip/ path pending   BREAST CYST ASPIRATION Left 11/12/2019   2 areas   BREAST LUMPECTOMY Right 12/10/2019   Procedure: BREAST LUMPECTOMY;  Surgeon: Ronny Bacon, MD;  Location: [facility];  Service: General;  Laterality: Right;   CARDIAC CATHETERIZATION     CORONARY STENT INTERVENTION N/A 08/09/2019   Procedure: CORONARY STENT INTERVENTION;  Surgeon: Nelva Bush, MD;  Location: [facility];  Service:  Cardiovascular;  Laterality: N/A;   LEFT HEART CATH AND CORONARY ANGIOGRAPHY N/A 08/09/2019   Procedure: LEFT HEART CATH AND CORONARY ANGIOGRAPHY;  Surgeon: Nelva Bush, MD;  Location: [facility];  Service: Cardiovascular;  Laterality: N/A;   Family History  Problem Relation Age of Onset   Heart failure Mother    Diabetes Mother    Hypertension Mother    Congestive Heart Failure Brother    Hypertension Brother    Cerebral palsy Son    Congestive Heart Failure Brother    Breast cancer Sister 79   Thyroid cancer Sister    Social History   Socioeconomic History   Marital status: Widowed    Spouse name: Not on file   Number of children: 2   Years of education: Not on file   Highest education level: Not on file  Occupational History   Occupation: Retired  Tobacco Use   Smoking status: Never   Smokeless tobacco: Never  Vaping Use   Vaping Use: Never used  Substance and Sexual Activity   Alcohol use: No    Alcohol/week: 0.0 standard drinks of alcohol   Drug use: No   Sexual activity: Never  Other Topics Concern   Not on file  Social History Narrative   Not on file   Social Determinants of Health   Financial Resource Strain: Low Risk  (05/27/2022)   Overall Financial Resource Strain (CARDIA)    Difficulty of Paying Living Expenses: Not hard at all  Food Insecurity: No Food Insecurity (05/27/2022)   Hunger Vital Sign    Worried About Running Out of Food in the Last Year: Never true    [place] in the Last Year: Never true  Transportation Needs: No Transportation Needs (05/27/2022)   PRAPARE - Hydrologist (Medical): No    Lack of Transportation (Non-Medical): No  Physical Activity: Insufficiently Active (05/27/2022)   Exercise Vital Sign    Days of Exercise per Week: 3 days    Minutes of Exercise per Session: 30 min  Stress: No Stress Concern Present (05/27/2022)   [place]    Feeling of Stress : Only a little  Social Connections: Socially Isolated (05/27/2022)   Social Connection and Isolation Panel [NHANES]    Frequency of Communication with Friends and Family: More than three times a week    Frequency of Social Gatherings with Friends and Family: More than three times a week    Attends Religious Services: Never    Marine scientist or Organizations: No  Attends Archivist Meetings: Never    Marital Status: Widowed    Tobacco Counseling Counseling given: Not Answered   Clinical Intake:  Pre-visit preparation completed: No  Pain : No/denies pain Pain Score: 0-No pain     Nutritional Status: BMI 25 -29 Overweight Nutritional Risks: None Diabetes: No  How often do you need to have someone help you when you read instructions, pamphlets, or other written materials from your doctor or pharmacy?: 1 - Never  Diabetic?No  Interpreter Needed?: No  Information entered by :: Donnie Mesa, CMA   Activities of Daily Living    05/27/2022    1:56 PM 09/18/2021    1:42 PM  In your present state of health, do you have any difficulty performing the following activities:  Hearing?  1  Vision? 1 1  Comment [facility], [employer]   Difficulty concentrating or making decisions? 0 0  Walking or climbing stairs? 0 0  Dressing or bathing? 0 0  Doing errands, shopping? 0 0    Patient Care Team: Jearld Fenton, NP as PCP - General (Internal Medicine) Wellington Hampshire, MD as PCP - Cardiology (Cardiology) Lloyd Huger, MD as Consulting Physician (Oncology) Noreene Filbert, MD as Referring Physician (Radiation Oncology) Ronny Bacon, MD as Consulting Physician (General Surgery) Rico Junker, RN as Registered Nurse  Indicate any recent Medical Services you may have received from other than [facility] providers in the past year (date may be approximate).    No hospitalization in the past 12  months  Assessment:   This is a routine wellness examination for [facility].  Hearing/Vision screen Denies any hearing issues. Denies any changes with her vision.   Dietary issues and exercise activities discussed: Current Exercise Habits: Structured exercise class;Home exercise routine, Time (Minutes): 30, Frequency (Times/Week): 3, Weekly Exercise (Minutes/Week): 90, Intensity: Mild   Goals Addressed   None    Depression Screen    09/18/2021    1:42 PM 03/18/2021    8:16 AM 03/09/2020    9:25 AM 03/09/2019    9:59 AM 03/02/2018    2:23 PM 02/28/2017    2:33 PM 02/27/2016    2:46 PM  PHQ 2/9 Scores  PHQ - 2 Score 0 0 1 0 0 2 0  PHQ- 9 Score 0 0    5   Exception Documentation     Patient refusal      Fall Risk    05/27/2022    1:55 PM 09/18/2021    1:40 PM 03/21/2020    1:08 PM 03/09/2020    9:24 AM 12/16/2019   10:48 AM  Fall Risk   Falls in the past year? 0 0 0 0 0  Number falls in past yr: 0 0 0 0 0  Injury with Fall? 0 0 0  0  Risk for fall due to : No Fall Risks No Fall Risks     Follow up Falls evaluation completed Falls evaluation completed       [place]:  Any stairs in or around the home? Yes  If so, are there any without handrails? No  Home free of loose throw rugs in walkways, pet beds, electrical cords, etc? Yes  Adequate lighting in your home to reduce risk of falls? Yes   ASSISTIVE DEVICES UTILIZED TO PREVENT FALLS:  Life alert? No  Use of a cane, walker or w/c? No  Grab bars in the bathroom? No  Shower chair or bench  in shower? Yes  Elevated toilet seat or a handicapped toilet? No   TIMED UP AND GO:  Was the test performed? Yes .  Length of time to ambulate 10 feet: 10 sec.   Gait steady and fast without use of assistive device  Cognitive Function:        05/27/2022    1:57 PM  6CIT Screen  What Year? 0 points  What month? 0 points  What time? 0 points  Count back from 20 0 points  Months in reverse 0 points   Repeat phrase 0 points  Total Score 0 points    Immunizations Immunization History  Administered Date(s) Administered   Fluad Quad(high Dose 65+) 06/07/2020, 05/03/2022   Influenza, High Dose Seasonal PF 05/21/2019   Influenza,inj,Quad PF,6+ Mos 07/31/2017, 06/09/2018   Influenza-Unspecified 05/19/2021   PFIZER(Purple Top)SARS-COV-2 Vaccination 10/04/2019, 10/25/2019, 06/10/2020, 01/18/2021   Pfizer Covid-19 Vaccine Bivalent Booster 47yr & up 05/19/2021   Pneumococcal Conjugate-13 03/15/2021   Pneumococcal Polysaccharide-23 03/21/2022   Unspecified SARS-COV-2 Vaccination 05/03/2022    TDAP status: Due, Education has been provided regarding the importance of this vaccine. Advised may receive this vaccine at local pharmacy or Health Dept. Aware to provide a copy of the vaccination record if obtained from local pharmacy or Health Dept. Verbalized acceptance and understanding.  Flu Vaccine status: Up to date  Pneumococcal vaccine status: Up to date  Covid-19 vaccine status: Information provided on how to obtain vaccines.   Qualifies for Shingles Vaccine? Yes   Zostavax completed No   Shingrix Completed?: No.    Education has been provided regarding the importance of this vaccine. Patient has been advised to call insurance company to determine out of pocket expense if they have not yet received this vaccine. Advised may also receive vaccine at local pharmacy or Health Dept. Verbalized acceptance and understanding.  Screening Tests Health Maintenance  Topic Date Due   OPHTHALMOLOGY EXAM  Never done   Zoster Vaccines- Shingrix (1 of 2) Never done   TETANUS/TDAP  03/22/2023 (Originally 11/09/1959)   COVID-19 Vaccine (7 - Pfizer risk series) 06/28/2022   Diabetic kidney evaluation - Urine ACR  09/18/2022   HEMOGLOBIN A1C  09/21/2022   Diabetic kidney evaluation - GFR measurement  03/22/2023   FOOT EXAM  03/22/2023   Pneumonia Vaccine 81 Years old  Completed   INFLUENZA VACCINE   Completed   DEXA SCAN  Completed   HPV VACCINES  Aged Out    Health Maintenance  Health Maintenance Due  Topic Date Due   OPHTHALMOLOGY EXAM  Never done   Zoster Vaccines- Shingrix (1 of 2) Never done    Colorectal cancer screening: No longer required.   Mammogram status: No longer required due to age.  DEXA scan: 04/17/2022   Lung Cancer Screening: (Low Dose CT Chest recommended if Age 81-80years, 30 pack-year currently smoking OR have quit w/in 15years.) does not qualify.  Additional Screening:  Hepatitis C Screening: does qualify  Vision Screening: Recommended annual ophthalmology exams for early detection of glaucoma and other disorders of the eye. Is the patient up to date with their annual eye exam?  Yes  Who is the provider or what is the name of the office in which the patient attends annual eye exams? [facility]  If pt is not established with a provider, would they like to be referred to a provider to establish care? No .   Dental Screening: Recommended annual dental exams for proper oral hygiene  Community  Resource Referral / Chronic Care Management: CRR required this visit?  No   CCM required this visit?  No      Plan:     I have personally reviewed and noted the following in the patient's chart:   Medical and social history Use of alcohol, tobacco or illicit drugs  Current medications and supplements including opioid prescriptions. Patient is not currently taking opioid prescriptions. Functional ability and status Nutritional status Physical activity Advanced directives List of other physicians Hospitalizations, surgeries, and ER visits in previous 12 months Vitals Screenings to include cognitive, depression, and falls Referrals and appointments  In addition, I have reviewed and discussed with patient certain preventive protocols, quality metrics, and best practice recommendations. A written personalized care plan for preventive services as well  as general preventive health recommendations were provided to patient.     Wilson Singer, Miguel Barrera   05/27/2022   Nurse Notes: Approximately 30 minute Face-To-Face Visit

## 2022-05-30 ENCOUNTER — Ambulatory Visit (INDEPENDENT_AMBULATORY_CARE_PROVIDER_SITE_OTHER): Payer: Medicare HMO | Admitting: Internal Medicine

## 2022-05-30 ENCOUNTER — Encounter: Payer: Self-pay | Admitting: Internal Medicine

## 2022-05-30 VITALS — BP 128/86 | HR 78 | Temp 96.9°F | Wt 160.0 lb

## 2022-05-30 DIAGNOSIS — B351 Tinea unguium: Secondary | ICD-10-CM

## 2022-05-30 DIAGNOSIS — N1831 Chronic kidney disease, stage 3a: Secondary | ICD-10-CM | POA: Diagnosis not present

## 2022-05-30 DIAGNOSIS — E1122 Type 2 diabetes mellitus with diabetic chronic kidney disease: Secondary | ICD-10-CM

## 2022-05-30 NOTE — Patient Instructions (Signed)

## 2022-05-30 NOTE — Progress Notes (Signed)
Subjective:    Patient ID: Janice Liu, female    DOB: [birth year], 81 y.o.   MRN: [id number]  HPI  Patient presents the clinic today with complaint of thick, discolored toenails.  This has been an ongoing issue but she is concerned because her left great toenail is starting to lift up.  She reports she is unable to trim her toenails on her own.  She does have a history of DM 2.  Review of Systems     Past Medical History:  Diagnosis Date   Allergy    Breast cancer ([place])    2021   CAD (coronary artery disease)    Chicken pox    Diabetes mellitus ([facility])    Heart murmur    HFrEF (heart failure with reduced ejection fraction) ([facility])    Hypertension    Personal history of radiation therapy     Current Outpatient Medications  Medication Sig Dispense Refill   acetaminophen (TYLENOL) 500 MG tablet Take 500-1,000 mg by mouth every 6 (six) hours as needed (for pain.).     alendronate (FOSAMAX) 70 MG tablet TAKE 1 TABLET BY MOUTH ONCE A WEEK. TAKE WITH A FULL GLASS OF WATER ON AN EMPTY STOMACH. 12 tablet 3   aspirin EC 81 MG tablet Take 1 tablet (81 mg total) by mouth daily. Swallow whole.     calcium carbonate (OS-CAL - DOSED IN MG OF ELEMENTAL CALCIUM) 1250 (500 Ca) MG tablet Take 500 mg by mouth daily.      carvedilol (COREG) 25 MG tablet TAKE 1 TABLET BY MOUTH TWICE A DAY 180 tablet 0   cyanocobalamin 2000 MCG tablet Take 2,000 mcg by mouth daily. (Patient not taking: Reported on 05/27/2022)     fenofibrate (TRICOR) 145 MG tablet TAKE 1 TABLET BY MOUTH EVERY DAY 90 tablet 1   ferrous sulfate 325 (65 FE) MG tablet TAKE 1 TABLET BY MOUTH EVERY DAY 90 tablet 0   letrozole (FEMARA) 2.5 MG tablet TAKE 1 TABLET BY MOUTH EVERY DAY 90 tablet 3   losartan (COZAAR) 100 MG tablet TAKE 1 TABLET BY MOUTH EVERY DAY. STOP AMLODIPINE 90 tablet 0   Omega-3 Fatty Acids (FISH OIL) 500 MG CAPS Take 500 mg by mouth daily.     rosuvastatin (CRESTOR) 10 MG tablet TAKE 1 TABLET BY MOUTH EVERY DAY 90 tablet  2   spironolactone (ALDACTONE) 25 MG tablet TAKE 1 TABLET (25 MG TOTAL) BY MOUTH DAILY. 90 tablet 0   No current facility-administered medications for this visit.    No Known Allergies  Family History  Problem Relation Age of Onset   Heart failure Mother    Diabetes Mother    Hypertension Mother    Congestive Heart Failure Brother    Hypertension Brother    Cerebral palsy Son    Congestive Heart Failure Brother    Breast cancer Sister 78   Thyroid cancer Sister     Social History   Socioeconomic History   Marital status: Widowed    Spouse name: Not on file   Number of children: 2   Years of education: Not on file   Highest education level: Not on file  Occupational History   Occupation: Retired  Tobacco Use   Smoking status: Never   Smokeless tobacco: Never  Vaping Use   Vaping Use: Never used  Substance and Sexual Activity   Alcohol use: No    Alcohol/week: 0.0 standard drinks of alcohol   Drug use: No  Sexual activity: Never  Other Topics Concern   Not on file  Social History Narrative   Not on file   Social Determinants of Health   Financial Resource Strain: Low Risk  (05/27/2022)   Overall Financial Resource Strain (CARDIA)    Difficulty of Paying Living Expenses: Not hard at all  Food Insecurity: No Food Insecurity (05/27/2022)   Hunger Vital Sign    Worried About Running Out of Food in the Last Year: Never true    Ran Out of Food in the Last Year: Never true  Transportation Needs: No Transportation Needs (05/27/2022)   PRAPARE - Hydrologist (Medical): No    Lack of Transportation (Non-Medical): No  Physical Activity: Insufficiently Active (05/27/2022)   Exercise Vital Sign    Days of Exercise per Week: 3 days    Minutes of Exercise per Session: 30 min  Stress: No Stress Concern Present (05/27/2022)   [place]    Feeling of Stress : Only a little   Social Connections: Socially Isolated (05/27/2022)   Social Connection and Isolation Panel [NHANES]    Frequency of Communication with Friends and Family: More than three times a week    Frequency of Social Gatherings with Friends and Family: More than three times a week    Attends Religious Services: Never    Marine scientist or Organizations: No    Attends Archivist Meetings: Never    Marital Status: Widowed  Intimate Partner Violence: Not At Risk (05/27/2022)   Humiliation, Afraid, Rape, and Kick questionnaire    Fear of Current or Ex-Partner: No    Emotionally Abused: No    Physically Abused: No    Sexually Abused: No     Constitutional: Denies fever, malaise, fatigue, headache or abrupt weight changes.  Respiratory: Denies difficulty breathing, shortness of breath, cough or sputum production.   Cardiovascular: Denies chest pain, chest tightness, palpitations or swelling in the hands or feet.  Skin: Patient reports thick, discolored loose toenails.  Denies redness, rashes, lesions or ulcercations.   No other specific complaints in a complete review of systems (except as listed in HPI above).  Objective:   Physical Exam  BP 128/86 (BP Location: Left Arm, Patient Position: Sitting, Cuff Size: Normal)   Pulse 78   Temp (!) 96.9 F (36.1 C) (Temporal)   Wt 160 lb (72.6 kg)   SpO2 100%   BMI 29.74 kg/m   Wt Readings from Last 3 Encounters:  05/27/22 160 lb (72.6 kg)  03/27/22 157 lb (71.2 kg)  03/21/22 157 lb (71.2 kg)    General: Appears her stated age, over weight, in NAD. Skin: Fungal nails noted of bilateral great toes. Cardiovascular: Normal rate and rhythm.  Pulmonary/Chest: Normal effort and positive vesicular breath sounds. No respiratory distress. No wheezes, rales or ronchi noted.  Neurological: Alert and oriented.    BMET    Component Value Date/Time   NA 138 03/21/2022 1327   NA 148 (H) 07/10/2021 0938   K 4.7 03/21/2022 1327   CL  104 03/21/2022 1327   CO2 25 03/21/2022 1327   GLUCOSE 111 (H) 03/21/2022 1327   BUN 32 (H) 03/21/2022 1327   BUN 20 07/10/2021 0938   CREATININE 1.26 (H) 03/21/2022 1327   CALCIUM 9.7 03/21/2022 1327   GFRNONAA 49 (L) 12/08/2019 1113   GFRAA 57 (L) 12/08/2019 1113    Lipid Panel  Component Value Date/Time   CHOL 147 03/21/2022 1327   CHOL 274 (H) 08/15/2015 0803   TRIG 192 (H) 03/21/2022 1327   HDL 46 (L) 03/21/2022 1327   HDL 38 (L) 08/15/2015 0803   CHOLHDL 3.2 03/21/2022 1327   VLDL 31.4 03/09/2020 0949   LDLCALC 72 03/21/2022 1327    CBC    Component Value Date/Time   WBC 8.3 03/21/2022 1327   RBC 4.08 03/21/2022 1327   HGB 12.6 03/21/2022 1327   HGB 14.5 10/20/2020 1521   HCT 37.7 03/21/2022 1327   HCT 43.4 10/20/2020 1521   PLT 429 (H) 03/21/2022 1327   PLT 438 10/20/2020 1521   MCV 92.4 03/21/2022 1327   MCV 90 10/20/2020 1521   MCH 30.9 03/21/2022 1327   MCHC 33.4 03/21/2022 1327   RDW 12.1 03/21/2022 1327   RDW 11.9 10/20/2020 1521   LYMPHSABS 2.5 10/20/2020 1521   MONOABS 0.6 12/08/2019 1113   EOSABS 0.2 10/20/2020 1521   BASOSABS 0.0 10/20/2020 1521    Hgb A1C Lab Results  Component Value Date   HGBA1C 6.2 (H) 03/21/2022            Assessment & Plan:   Fungal Toenails, DM 2:  Given her history of diabetes and severe dystrophy of her toenails, will refer to podiatry for further evaluation and treatment  RTC in 4 months for follow-up of chronic conditions Webb Silversmith, NP

## 2022-06-03 ENCOUNTER — Other Ambulatory Visit: Payer: Self-pay | Admitting: Internal Medicine

## 2022-06-04 NOTE — Telephone Encounter (Signed)
Requested Prescriptions  Pending Prescriptions Disp Refills  . carvedilol (COREG) 25 MG tablet [Pharmacy Med Name: CARVEDILOL 25 MG TABLET] 180 tablet 1    Sig: TAKE 1 TABLET BY MOUTH TWICE A DAY     Cardiovascular: Beta Blockers 3 Failed - 06/03/2022  4:20 PM      Failed - Cr in normal range and within 360 days    Creat  Date Value Ref Range Status  03/21/2022 1.26 (H) 0.60 - 0.95 mg/dL Final   Creatinine, Urine  Date Value Ref Range Status  09/18/2021 90 20 - 275 mg/dL Final         Passed - AST in normal range and within 360 days    AST  Date Value Ref Range Status  03/21/2022 17 10 - 35 U/L Final         Passed - ALT in normal range and within 360 days    ALT  Date Value Ref Range Status  03/21/2022 15 6 - 29 U/L Final         Passed - Last BP in normal range    BP Readings from Last 1 Encounters:  05/30/22 128/86         Passed - Last Heart Rate in normal range    Pulse Readings from Last 1 Encounters:  05/30/22 78         Passed - Valid encounter within last 6 months    Recent Outpatient Visits          5 days ago Toenail fungus   [place], Coralie Keens, NP   2 months ago Encounter for general adult medical examination with abnormal findings   [facility], Coralie Keens, NP   8 months ago Type 2 diabetes mellitus without complication, without long-term current use of insulin ([place])   [facility], Coralie Keens, NP   1 year ago Medicare annual wellness visit, subsequent   [facility] [place], Coralie Keens, NP      Future Appointments            In 2 days Posey Pronto, Thomasene Lot, DPM [facility] [place] [facility] [place], [place]   In 1 month Fletcher Anon, Mertie Clause, MD [place] [place]   In 3 months Baity, Coralie Keens, NP [facility], [facility]

## 2022-06-05 ENCOUNTER — Other Ambulatory Visit: Payer: Self-pay | Admitting: Cardiovascular Disease

## 2022-06-06 ENCOUNTER — Ambulatory Visit: Payer: Medicare HMO | Admitting: Podiatry

## 2022-06-06 DIAGNOSIS — M79675 Pain in left toe(s): Secondary | ICD-10-CM

## 2022-06-06 DIAGNOSIS — B351 Tinea unguium: Secondary | ICD-10-CM | POA: Diagnosis not present

## 2022-06-06 DIAGNOSIS — N1831 Chronic kidney disease, stage 3a: Secondary | ICD-10-CM

## 2022-06-06 DIAGNOSIS — M79674 Pain in right toe(s): Secondary | ICD-10-CM

## 2022-06-06 DIAGNOSIS — E1122 Type 2 diabetes mellitus with diabetic chronic kidney disease: Secondary | ICD-10-CM

## 2022-06-13 ENCOUNTER — Encounter: Payer: Self-pay | Admitting: Podiatry

## 2022-06-13 NOTE — Progress Notes (Signed)
  Subjective:  Patient ID: Janice Liu, female    DOB: [birth year],  MRN: [id number]  Chief Complaint  Patient presents with   Nail Problem    Long thick nails    81 y.o. female returns for the above complaint.  Patient presents with thickened elongated dystrophic toenails x10 mild pain on palpation hurts with ambulation hurts with pressure.  She would like to have me debride them down as she is not able to do it herself.  She is a diabetic with last A1c of 6.2  Objective:  There were no vitals filed for this visit. Podiatric Exam: Vascular: dorsalis pedis and posterior tibial pulses are palpable bilateral. Capillary return is immediate. Temperature gradient is WNL. Skin turgor WNL  Sensorium: Normal Semmes Weinstein monofilament test. Normal tactile sensation bilaterally. Nail Exam: Pt has thick disfigured discolored nails with subungual debris noted bilateral entire nail hallux through fifth toenails.  Pain on palpation to the nails. Ulcer Exam: There is no evidence of ulcer or pre-ulcerative changes or infection. Orthopedic Exam: Muscle tone and strength are WNL. No limitations in general ROM. No crepitus or effusions noted.  Skin: No Porokeratosis. No infection or ulcers    Assessment & Plan:  No diagnosis found.  Patient was evaluated and treated and all questions answered.  Onychomycosis with pain  -Nails palliatively debrided as below. -Educated on self-care  Procedure: Nail Debridement Rationale: pain  Type of Debridement: manual, sharp debridement. Instrumentation: Nail nipper, rotary burr. Number of Nails: 10  Procedures and Treatment: Consent by patient was obtained for treatment procedures. The patient understood the discussion of treatment and procedures well. All questions were answered thoroughly reviewed. Debridement of mycotic and hypertrophic toenails, 1 through 5 bilateral and clearing of subungual debris. No ulceration, no infection noted.  Return  Visit-Office Procedure: Patient instructed to return to the office for a follow up visit 3 months for continued evaluation and treatment.  Boneta Lucks, DPM    No follow-ups on file.

## 2022-06-16 ENCOUNTER — Other Ambulatory Visit: Payer: Self-pay | Admitting: Cardiovascular Disease

## 2022-06-24 ENCOUNTER — Inpatient Hospital Stay: Payer: Medicare HMO | Attending: Nurse Practitioner | Admitting: Nurse Practitioner

## 2022-06-24 ENCOUNTER — Encounter: Payer: Self-pay | Admitting: Nurse Practitioner

## 2022-06-24 VITALS — BP 140/69 | HR 79 | Temp 97.8°F | Wt 159.4 lb

## 2022-06-24 DIAGNOSIS — E119 Type 2 diabetes mellitus without complications: Secondary | ICD-10-CM | POA: Insufficient documentation

## 2022-06-24 DIAGNOSIS — Z808 Family history of malignant neoplasm of other organs or systems: Secondary | ICD-10-CM | POA: Insufficient documentation

## 2022-06-24 DIAGNOSIS — Z79811 Long term (current) use of aromatase inhibitors: Secondary | ICD-10-CM | POA: Insufficient documentation

## 2022-06-24 DIAGNOSIS — Z5181 Encounter for therapeutic drug level monitoring: Secondary | ICD-10-CM | POA: Diagnosis not present

## 2022-06-24 DIAGNOSIS — I110 Hypertensive heart disease with heart failure: Secondary | ICD-10-CM | POA: Diagnosis not present

## 2022-06-24 DIAGNOSIS — Z803 Family history of malignant neoplasm of breast: Secondary | ICD-10-CM | POA: Diagnosis not present

## 2022-06-24 DIAGNOSIS — M810 Age-related osteoporosis without current pathological fracture: Secondary | ICD-10-CM | POA: Diagnosis not present

## 2022-06-24 DIAGNOSIS — I5022 Chronic systolic (congestive) heart failure: Secondary | ICD-10-CM | POA: Insufficient documentation

## 2022-06-24 DIAGNOSIS — Z923 Personal history of irradiation: Secondary | ICD-10-CM | POA: Insufficient documentation

## 2022-06-24 DIAGNOSIS — Z853 Personal history of malignant neoplasm of breast: Secondary | ICD-10-CM | POA: Insufficient documentation

## 2022-06-24 DIAGNOSIS — Z08 Encounter for follow-up examination after completed treatment for malignant neoplasm: Secondary | ICD-10-CM

## 2022-06-24 NOTE — Progress Notes (Signed)
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  INTERVAL HISTORY: Patient returns to clinic today for routine 19-monthevaluation and discussion of her bone density results. She continues to tolerate letrozole and Fosamax well without significant side effects. She currently feels well and is asymptomatic.She does her own shopping, stays active. Performs self breast exams and denies complaints. She has no neurologic complaints.  She denies any recent fevers or illnesses.  She has a good appetite and denies weight loss. She has no chest pain, shortness of breath, cough, or hemoptysis. She denies any nausea, vomiting, constipation, or diarrhea.  She has no melena or hematochezia.  She has no urinary complaints.  Patient feels at her baseline offers no specific complaints today.  REVIEW OF SYSTEMS:   Review of Systems  Constitutional: Negative.  Negative for fever, malaise/fatigue and weight loss.  Respiratory: Negative.  Negative for cough and shortness of breath.   Cardiovascular: Negative.  Negative for chest pain and leg swelling.  Gastrointestinal: Negative.  Negative for abdominal pain, blood in stool and melena.  Genitourinary: Negative.  Negative for hematuria.  Musculoskeletal: Negative.  Negative for back pain.  Skin: Negative.  Negative for rash.  Neurological: Negative.  Negative for dizziness, focal weakness,  weakness and headaches.  Psychiatric/Behavioral: Negative.  The patient is not nervous/anxious.   As per HPI. Otherwise, a complete review of systems is negative.  PAST MEDICAL HISTORY: Past Medical History:  Diagnosis Date   Allergy    Breast cancer ([place]    2021   CAD (coronary artery disease)    Chicken pox    Diabetes mellitus ([place]    Heart murmur    HFrEF (heart failure with reduced ejection fraction) [place]    Hypertension    Personal history of radiation therapy     PAST SURGICAL HISTORY: Past Surgical History:  Procedure Laterality Date   BREAST BIOPSY Right 11/04/2019   Affirm Bx- positive- Ribbon Clip   BREAST BIOPSY Left 11/04/2019   Affirm Bx- neg- Coil Clip   BREAST BIOPSY Left 11/12/2019   uKoreabx/ ribbon clip/ path pending   BREAST CYST ASPIRATION Left 11/12/2019   2 areas   BREAST LUMPECTOMY Right 12/10/2019   Procedure: BREAST LUMPECTOMY;  Surgeon: RRonny Bacon MD;  [facility];  Service: General;  Laterality: Right;   CARDIAC CATHETERIZATION     CORONARY STENT INTERVENTION N/A 08/09/2019   Procedure: CORONARY STENT INTERVENTION;  Surgeon: ENelva Bush MD;  [facility];  Service: Cardiovascular;  Laterality: N/A;   LEFT HEART CATH AND CORONARY ANGIOGRAPHY N/A 08/09/2019   Procedure: LEFT HEART CATH AND CORONARY ANGIOGRAPHY;  Surgeon: ENelva Bush MD;  [facility];  Service: Cardiovascular;  Laterality: N/A;    FAMILY HISTORY: Family History  Problem Relation Age of Onset   Heart failure Mother    Diabetes Mother    Hypertension Mother    Congestive Heart Failure Brother    Hypertension Brother    Cerebral palsy  Son    Congestive Heart Failure Brother    Breast cancer Sister 48   Thyroid cancer Sister     ADVANCED DIRECTIVES (Y/N):  N  HEALTH MAINTENANCE: Social History   Tobacco Use   Smoking status: Never   Smokeless tobacco: Never  Vaping Use   Vaping Use: Never used  Substance  Use Topics   Alcohol use: No    Alcohol/week: 0.0 standard drinks of alcohol   Drug use: No     Colonoscopy:  PAP:  Bone density:  Lipid panel:  No Known Allergies  Current Outpatient Medications  Medication Sig Dispense Refill   acetaminophen (TYLENOL) 500 MG tablet Take 500-1,000 mg by mouth every 6 (six) hours as needed (for pain.).     alendronate (FOSAMAX) 70 MG tablet TAKE 1 TABLET BY MOUTH ONCE A WEEK. TAKE WITH A FULL GLASS OF WATER ON AN EMPTY STOMACH. 12 tablet 3   aspirin EC 81 MG tablet Take 1 tablet (81 mg total) by mouth daily. Swallow whole.     calcium carbonate (OS-CAL - DOSED IN MG OF ELEMENTAL CALCIUM) 1250 (500 Ca) MG tablet Take 500 mg by mouth daily.      carvedilol (COREG) 25 MG tablet TAKE 1 TABLET BY MOUTH TWICE A DAY 180 tablet 1   fenofibrate (TRICOR) 145 MG tablet TAKE 1 TABLET BY MOUTH EVERY DAY 90 tablet 1   ferrous sulfate 325 (65 FE) MG tablet TAKE 1 TABLET BY MOUTH EVERY DAY 90 tablet 0   letrozole (FEMARA) 2.5 MG tablet TAKE 1 TABLET BY MOUTH EVERY DAY 90 tablet 3   losartan (COZAAR) 100 MG tablet TAKE 1 TABLET BY MOUTH EVERY DAY. STOP AMLODIPINE 90 tablet 0   Omega-3 Fatty Acids (FISH OIL) 500 MG CAPS Take 500 mg by mouth daily.     rosuvastatin (CRESTOR) 10 MG tablet TAKE 1 TABLET BY MOUTH EVERY DAY 90 tablet 2   spironolactone (ALDACTONE) 25 MG tablet TAKE 1 TABLET (25 MG TOTAL) BY MOUTH DAILY. 90 tablet 0   cyanocobalamin 2000 MCG tablet Take 2,000 mcg by mouth daily. (Patient not taking: Reported on 05/27/2022)     No current facility-administered medications for this visit.    OBJECTIVE: Vitals:   06/24/22 1306  BP: (!) 140/69  Pulse: 79  Temp: 97.8 F (36.6 C)     Body mass index is 29.63 kg/m.    ECOG FS:0 - Asymptomatic  General: Well-developed, well-nourished, no acute distress. Unaccompanied.  Eyes: Pink conjunctiva, anicteric sclera. HEENT: Normocephalic, moist mucous membranes. Breast: Declined exam.  Lungs: No audible  wheezing or coughing. Heart: Regular rate and rhythm. Abdomen: Soft, nontender, no obvious distention. Musculoskeletal: No edema, cyanosis, or clubbing. Neuro: Alert, answering all questions appropriately. Cranial nerves grossly intact. Skin: No rashes or petechiae noted. Psych: Normal affect.  LAB RESULTS:  Lab Results  Component Value Date   NA 138 03/21/2022   K 4.7 03/21/2022   CL 104 03/21/2022   CO2 25 03/21/2022   GLUCOSE 111 (H) 03/21/2022   BUN 32 (H) 03/21/2022   CREATININE 1.26 (H) 03/21/2022   CALCIUM 9.7 03/21/2022   PROT 7.1 03/21/2022   ALBUMIN 4.5 03/09/2020   AST 17 03/21/2022   ALT 15 03/21/2022   ALKPHOS 63 03/09/2020   BILITOT 0.5 03/21/2022   GFRNONAA 49 (L) 12/08/2019   GFRAA 57 (L) 12/08/2019    Lab Results  Component Value Date   WBC 8.3 03/21/2022   NEUTROABS 5.8 10/20/2020   HGB 12.6  03/21/2022   HCT 37.7 03/21/2022   MCV 92.4 03/21/2022   PLT 429 (H) 03/21/2022   Lab Results  Component Value Date   IRON 74 10/20/2020   TIBC 363 10/20/2020   IRONPCTSAT 20 10/20/2020   Lab Results  Component Value Date   FERRITIN 147 10/20/2020    STUDIES: No results found. CLINICAL DATA:  History of right breast cancer status post lumpectomy in 2021.   EXAM: DIGITAL DIAGNOSTIC BILATERAL MAMMOGRAM WITH TOMOSYNTHESIS AND CAD   TECHNIQUE: Bilateral digital diagnostic mammography and breast tomosynthesis was performed. The images were evaluated with computer-aided detection.   COMPARISON:  Previous exam(s).   ACR Breast Density Category b: There are scattered areas of fibroglandular density.   FINDINGS: Stable lumpectomy changes are seen in the right breast. No suspicious mass or malignant type microcalcifications identified in either breast.   IMPRESSION: No evidence of malignancy in either breast.   RECOMMENDATION: Bilateral screening mammogram in 1 year is recommended.   I have discussed the findings and recommendations with the  patient. If applicable, a reminder letter will be sent to the patient regarding the next appointment.   BI-RADS CATEGORY  2: Benign.     Electronically Signed   By: Lillia Mountain M.D.   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  Given the fact that she is grade 1 and mucinous, Oncotype DX was not necessary.  Patient completed adjuvant XRT in July 2021.  Continue letrozole for a total of 5 years completing treatment in July 2026. Patient's most recent mammogram on Dec 17, 2021 was reported as bi-rads 2: benign. She will continue annual mammograms. Reviewed survivorship topics and surveillance guidelines.   2.  Osteoporosis: Bone density 03/23/20- T score -3.1. Repeat 03/19/21 slightly improved to -2.9. Her most recent bone density on 04/17/22 was t score -3.1. Results are stable. Continue Fosamax, calcium, and vitamin D supplementation.  Prefers to minimize appointments. Will plan to repeat bone density at time of mammogram in May 2025. If worse, consider tamoxifen vs IV bisphosphonate.   3.  Iron deficiency anemia: Resolved.  Patient's most recent hemoglobin was 12.7.  She last received IV Venofer on November 22, 2019.   Disposition: May 2024- mammogram 6 mo- see Grayland Ormond or myself for breast cancer surveillance- la  Patient expressed understanding and was in agreement with this plan. She also understands that She can call clinic at any time with any questions, concerns, or complaints.    Cancer Staging  Carcinoma of lower-outer quadrant of female breast, right [facility]) Staging form: Breast, AJCC 8th Edition - Clinical stage from 01/07/2020: Stage IB (cT2, cN0, cM0, G1, ER+, PR+,  HER2-) - Signed by Lloyd Huger, MD on 01/07/2020 Stage prefix: Initial diagnosis Histologic grading system: 3 grade system  Verlon Au, NP   06/24/2022

## 2022-07-09 ENCOUNTER — Ambulatory Visit: Payer: Medicare HMO | Attending: Cardiovascular Disease | Admitting: Cardiovascular Disease

## 2022-07-09 ENCOUNTER — Encounter: Payer: Self-pay | Admitting: Cardiovascular Disease

## 2022-07-09 VITALS — BP 136/70 | HR 77 | Ht 63.0 in | Wt 160.0 lb

## 2022-07-09 DIAGNOSIS — I10 Essential (primary) hypertension: Secondary | ICD-10-CM

## 2022-07-09 DIAGNOSIS — I5022 Chronic systolic (congestive) heart failure: Secondary | ICD-10-CM

## 2022-07-09 DIAGNOSIS — I255 Ischemic cardiomyopathy: Secondary | ICD-10-CM

## 2022-07-09 DIAGNOSIS — E785 Hyperlipidemia, unspecified: Secondary | ICD-10-CM | POA: Diagnosis not present

## 2022-07-09 DIAGNOSIS — I2510 Atherosclerotic heart disease of native coronary artery without angina pectoris: Secondary | ICD-10-CM | POA: Diagnosis not present

## 2022-07-09 NOTE — Progress Notes (Signed)
Cardiology Office Note   Date:  07/09/2022   ID:  Janice Liu, DOB [birth year], MRN [id number]  PCP:  Jearld Fenton, NP  Cardiologist:   Kathlyn Sacramento, MD   Chief Complaint  Patient presents with   Other    6 Month f/u no comp. Meds reviewed verbally with pt.      History of Present Illness: Janice Liu is a 81 y.o. female who presents for a follow-up visit regarding coronary artery disease and chronic systolic heart failure.  She has chronic medical conditions that include essential hypertension, hyperlipidemia and type 2 diabetes. She presented in December 2020 with non-ST elevation myocardial infarction.  Peak troponin was greater than 27,000.  Cardiac catheterization showed severe one-vessel coronary artery disease with 90% ulcerative stenosis in the mid LAD and moderate proximal LAD and distal RCA disease.  Ejection fraction was 40 to 45% with mildly elevated left ventricular end-diastolic pressure.  She underwent successful angioplasty and drug-eluting stent placement to the mid LAD.  She had a follow-up echocardiogram in March, 2021 that showed improvement in ejection fraction to 50%.  She underwent right lumpectomy in April 2021 for breast cancer.  He has been doing very well with no recent chest pain, shortness of breath or palpitations.  She is compliant with her medications.  Past Medical History:  Diagnosis Date   Allergy    Breast cancer ([place])    2021   CAD (coronary artery disease)    Chicken pox    Diabetes mellitus ([place])    Heart murmur    HFrEF (heart failure with reduced ejection fraction) ([place])    Hypertension    Personal history of radiation therapy     Past Surgical History:  Procedure Laterality Date   BREAST BIOPSY Right 11/04/2019   Affirm Bx- positive- Ribbon Clip   BREAST BIOPSY Left 11/04/2019   Affirm Bx- neg- Coil Clip   BREAST BIOPSY Left 11/12/2019   [place] bx/ ribbon clip/ path pending   BREAST CYST ASPIRATION Left 11/12/2019   2  areas   BREAST LUMPECTOMY Right 12/10/2019   Procedure: BREAST LUMPECTOMY;  Surgeon: Ronny Bacon, MD;  Location: [facility];  Service: General;  Laterality: Right;   CARDIAC CATHETERIZATION     CORONARY STENT INTERVENTION N/A 08/09/2019   Procedure: CORONARY STENT INTERVENTION;  Surgeon: Nelva Bush, MD;  Location: [facility];  Service: Cardiovascular;  Laterality: N/A;   LEFT HEART CATH AND CORONARY ANGIOGRAPHY N/A 08/09/2019   Procedure: LEFT HEART CATH AND CORONARY ANGIOGRAPHY;  Surgeon: Nelva Bush, MD;  Location: [facility];  Service: Cardiovascular;  Laterality: N/A;     Current Outpatient Medications  Medication Sig Dispense Refill   acetaminophen (TYLENOL) 500 MG tablet Take 500-1,000 mg by mouth every 6 (six) hours as needed (for pain.).     alendronate (FOSAMAX) 70 MG tablet TAKE 1 TABLET BY MOUTH ONCE A WEEK. TAKE WITH A FULL GLASS OF WATER ON AN EMPTY STOMACH. 12 tablet 3   aspirin EC 81 MG tablet Take 1 tablet (81 mg total) by mouth daily. Swallow whole.     calcium carbonate (OS-CAL - DOSED IN MG OF ELEMENTAL CALCIUM) 1250 (500 Ca) MG tablet Take 500 mg by mouth daily.      carvedilol (COREG) 25 MG tablet TAKE 1 TABLET BY MOUTH TWICE A DAY 180 tablet 1   cyanocobalamin 2000 MCG tablet Take 2,000 mcg by mouth daily.     fenofibrate (TRICOR) 145 MG  tablet TAKE 1 TABLET BY MOUTH EVERY DAY 90 tablet 1   ferrous sulfate 325 (65 FE) MG tablet TAKE 1 TABLET BY MOUTH EVERY DAY 90 tablet 0   letrozole (FEMARA) 2.5 MG tablet TAKE 1 TABLET BY MOUTH EVERY DAY 90 tablet 3   losartan (COZAAR) 100 MG tablet TAKE 1 TABLET BY MOUTH EVERY DAY. STOP AMLODIPINE 90 tablet 0   Omega-3 Fatty Acids (FISH OIL) 500 MG CAPS Take 500 mg by mouth daily.     rosuvastatin (CRESTOR) 10 MG tablet TAKE 1 TABLET BY MOUTH EVERY DAY 90 tablet 2   spironolactone (ALDACTONE) 25 MG tablet TAKE 1 TABLET (25 MG TOTAL) BY MOUTH DAILY. 90 tablet 0   No current facility-administered  medications for this visit.    Allergies:   Patient has no known allergies.    Social History:  The patient  reports that she has never smoked. She has never used smokeless tobacco. She reports that she does not drink alcohol and does not use drugs.   Family History:  The patient's family history includes Breast cancer (age of onset: 19) in her sister; Cerebral palsy in her son; Congestive Heart Failure in her brother and brother; Diabetes in her mother; Heart failure in her mother; Hypertension in her brother and mother; Thyroid cancer in her sister.    ROS:  Please see the history of present illness.   Otherwise, review of systems are positive for none.   All other systems are reviewed and negative.    PHYSICAL EXAM: VS:  BP 136/70 (BP Location: Left Arm, Patient Position: Sitting, Cuff Size: Normal)   Pulse 77   Ht '5\' 3"'$  (1.6 m)   Wt 160 lb (72.6 kg)   SpO2 98%   BMI 28.34 kg/m  , BMI Body mass index is 28.34 kg/m. GEN: Well nourished, well developed, in no acute distress  HEENT: normal  Neck: no JVD, carotid bruits, or masses Cardiac: RRR; no murmurs, rubs, or gallops, mild bilateral leg edema edema  Respiratory:  clear to auscultation bilaterally, normal work of breathing GI: soft, nontender, nondistended, + BS MS: no deformity or atrophy  Skin: warm and dry, no rash Neuro:  Strength and sensation are intact Psych: euthymic mood, full affect   EKG:  EKG is ordered today. The ekg ordered today demonstrates normal sinus rhythm with old septal infarct.   Recent Labs: 03/21/2022: ALT 15; BUN 32; Creat 1.26; Hemoglobin 12.6; Platelets 429; Potassium 4.7; Sodium 138; TSH 3.86    Lipid Panel    Component Value Date/Time   CHOL 147 03/21/2022 1327   CHOL 274 (H) 08/15/2015 0803   TRIG 192 (H) 03/21/2022 1327   HDL 46 (L) 03/21/2022 1327   HDL 38 (L) 08/15/2015 0803   CHOLHDL 3.2 03/21/2022 1327   VLDL 31.4 03/09/2020 0949   LDLCALC 72 03/21/2022 1327   LDLDIRECT 87.0  07/01/2019 0916      Wt Readings from Last 3 Encounters:  07/09/22 160 lb (72.6 kg)  06/24/22 159 lb 6.4 oz (72.3 kg)  05/30/22 160 lb (72.6 kg)       ASSESSMENT AND PLAN:  1.  Coronary artery disease involving native coronary arteries without angina: She is doing extremely well at the present time with no anginal symptoms.  Continue medical therapy and aspirin 81 mg daily.  2.  Chronic systolic heart failure due to ischemic cardiomyopathy: Ejection fraction was 40 to 45% but improved to 50% on most recent echocardiogram.  Continue carvedilol, losartan  and spironolactone.  I reviewed her most recent labs which showed stable renal function and electrolytes.  She does have mild chronic kidney disease.  3.  Essential hypertension: Blood pressure is reasonably controlled.  If blood pressure increases further, we should consider switching losartan to Entresto.  4.  Mixed hyperlipidemia: She is currently on rosuvastatin 10 mg daily and fenofibrate 145 mg once daily.  Most recent lipid profile in August showed an LDL of 72 and triglyceride of 192.  5.  Breast cancer: Followed by oncology and currently is on Femara.   Disposition:   Follow-up in 6 months.   Signed,  Kathlyn Sacramento, MD  07/09/2022 1:53 PM    [place]

## 2022-07-09 NOTE — Patient Instructions (Signed)

## 2022-07-21 ENCOUNTER — Other Ambulatory Visit: Payer: Self-pay | Admitting: Internal Medicine

## 2022-07-23 NOTE — Telephone Encounter (Signed)
Requested medications are due for refill today.  yes  Requested medications are on the active medications list.  yes  Last refill. 04/29/2022 #90 0 rf  Future visit scheduled.   no  Notes to clinic.  Labs are expired.    Requested Prescriptions  Pending Prescriptions Disp Refills   ferrous sulfate 325 (65 FE) MG tablet [Pharmacy Med Name: FERROUS SULFATE 325 MG TABLET] 90 tablet 0    Sig: TAKE 1 TABLET BY MOUTH EVERY DAY     Endocrinology:  Minerals - Iron Supplementation Failed - 07/21/2022  5:18 PM      Failed - Fe (serum) in normal range and within 360 days    Iron  Date Value Ref Range Status  10/20/2020 74 27 - 139 ug/dL Final   Iron Saturation  Date Value Ref Range Status  10/20/2020 20 15 - 55 % Final         Failed - Ferritin in normal range and within 360 days    Ferritin  Date Value Ref Range Status  10/20/2020 147 15 - 150 ng/mL Final         Passed - HGB in normal range and within 360 days    Hemoglobin  Date Value Ref Range Status  03/21/2022 12.6 11.7 - 15.5 g/dL Final  10/20/2020 14.5 11.1 - 15.9 g/dL Final         Passed - HCT in normal range and within 360 days    HCT  Date Value Ref Range Status  03/21/2022 37.7 35.0 - 45.0 % Final   Hematocrit  Date Value Ref Range Status  10/20/2020 43.4 34.0 - 46.6 % Final         Passed - RBC in normal range and within 360 days    RBC  Date Value Ref Range Status  03/21/2022 4.08 3.80 - 5.10 Million/uL Final         Passed - Valid encounter within last 12 months    Recent Outpatient Visits           1 month ago Toenail fungus   [place], Coralie Keens, NP   4 months ago Encounter for general adult medical examination with abnormal findings   [facility] [place], Coralie Keens, NP   10 months ago Type 2 diabetes mellitus without complication, without long-term current use of insulin ([place])   [facility] [place], Coralie Keens, NP   1 year ago Medicare annual  wellness visit, subsequent   [facility] [place], Coralie Keens, NP       Future Appointments             In 2 months [place], Coralie Keens, NP [facility], [place]   In 5 months [place] Anon, Mertie Clause, MD [place] [place]

## 2022-09-01 ENCOUNTER — Other Ambulatory Visit: Payer: Self-pay | Admitting: Cardiovascular Disease

## 2022-09-19 ENCOUNTER — Other Ambulatory Visit: Payer: Self-pay | Admitting: Cardiovascular Disease

## 2022-09-19 ENCOUNTER — Other Ambulatory Visit: Payer: Self-pay | Admitting: Internal Medicine

## 2022-09-19 NOTE — Telephone Encounter (Signed)
Requested Prescriptions  Pending Prescriptions Disp Refills   fenofibrate (TRICOR) 145 MG tablet [Pharmacy Med Name: FENOFIBRATE 145 MG TABLET] 90 tablet 1    Sig: TAKE 1 TABLET BY MOUTH EVERY DAY     Cardiovascular:  Antilipid - Fibric Acid Derivatives Failed - 09/19/2022  1:28 AM      Failed - Cr in normal range and within 360 days    Creat  Date Value Ref Range Status  03/21/2022 1.26 (H) 0.60 - 0.95 mg/dL Final   Creatinine, Urine  Date Value Ref Range Status  09/18/2021 90 20 - 275 mg/dL Final         Failed - PLT in normal range and within 360 days    Platelets  Date Value Ref Range Status  03/21/2022 429 (H) 140 - 400 Thousand/uL Final  10/20/2020 438 150 - 450 x10E3/uL Final         Failed - Lipid Panel in normal range within the last 12 months    Cholesterol, Total  Date Value Ref Range Status  08/15/2015 274 (H) 100 - 199 mg/dL Final   Cholesterol  Date Value Ref Range Status  03/21/2022 147 <200 mg/dL Final   LDL Cholesterol (Calc)  Date Value Ref Range Status  03/21/2022 72 mg/dL (calc) Final    Comment:    Reference range: <100 . Desirable range <100 mg/dL for primary prevention;   <70 mg/dL for patients with CHD or diabetic patients  with > or = 2 CHD risk factors. Marland Kitchen LDL-C is now calculated using the Martin-Hopkins  calculation, which is a validated novel method providing  better accuracy than the Friedewald equation in the  estimation of LDL-C.  Cresenciano Genre et al. Annamaria Helling. 8676;195(09): 2061-2068  (http://education.[facility].com/faq/FAQ164)    Direct LDL  Date Value Ref Range Status  07/01/2019 87.0 mg/dL Final    Comment:    Optimal:  <100 mg/dLNear or Above Optimal:  100-129 mg/dLBorderline High:  130-159 mg/dLHigh:  160-189 mg/dLVery High:  >190 mg/dL   HDL  Date Value Ref Range Status  03/21/2022 46 (L) > OR = 50 mg/dL Final  08/15/2015 38 (L) >39 mg/dL Final   Triglycerides  Date Value Ref Range Status  03/21/2022 192 (H) <150 mg/dL  Final         Passed - ALT in normal range and within 360 days    ALT  Date Value Ref Range Status  03/21/2022 15 6 - 29 U/L Final         Passed - AST in normal range and within 360 days    AST  Date Value Ref Range Status  03/21/2022 17 10 - 35 U/L Final         Passed - HGB in normal range and within 360 days    Hemoglobin  Date Value Ref Range Status  03/21/2022 12.6 11.7 - 15.5 g/dL Final  10/20/2020 14.5 11.1 - 15.9 g/dL Final         Passed - HCT in normal range and within 360 days    HCT  Date Value Ref Range Status  03/21/2022 37.7 35.0 - 45.0 % Final   Hematocrit  Date Value Ref Range Status  10/20/2020 43.4 34.0 - 46.6 % Final         Passed - WBC in normal range and within 360 days    WBC  Date Value Ref Range Status  03/21/2022 8.3 3.8 - 10.8 Thousand/uL Final         Passed -  eGFR is 30 or above and within 360 days    GFR calc Af Amer  Date Value Ref Range Status  12/08/2019 57 (L) >60 mL/min Final   GFR calc non Af Amer  Date Value Ref Range Status  12/08/2019 49 (L) >60 mL/min Final   GFR  Date Value Ref Range Status  07/14/2020 49.90 (L) >60.00 mL/min Final    Comment:    Calculated using the CKD-EPI Creatinine Equation (2021)   eGFR  Date Value Ref Range Status  03/21/2022 43 (L) > OR = 60 mL/min/1.65m Final  07/10/2021 55 (L) >59 mL/min/1.73 Final         Passed - Valid encounter within last 12 months    Recent Outpatient Visits           3 months [facility]   6 months ago Encounter for general adult medical examination with abnormal findings   [facility] [place]   1 year ago Type 2 diabetes mellitus without complication, without long-term current use of insulin ([facility]   [facility]   1 year ago Medicare annual wellness visit, subsequent   [facility]       Future Appointments             In 1 week BGarnette Gunner RC[facility] Keens [facility] [place]  In 3 months AFletcher Anon MMertie Clause MD [facility]

## 2022-09-26 ENCOUNTER — Ambulatory Visit: Payer: Medicare HMO | Admitting: Internal Medicine

## 2022-09-30 ENCOUNTER — Ambulatory Visit: Payer: Medicare HMO | Admitting: Internal Medicine

## 2022-10-01 ENCOUNTER — Ambulatory Visit (INDEPENDENT_AMBULATORY_CARE_PROVIDER_SITE_OTHER): Payer: Medicare HMO | Admitting: Internal Medicine

## 2022-10-01 ENCOUNTER — Encounter: Payer: Self-pay | Admitting: Internal Medicine

## 2022-10-01 VITALS — BP 136/68 | HR 85 | Temp 96.3°F | Wt 160.0 lb

## 2022-10-01 DIAGNOSIS — Z6828 Body mass index (BMI) 28.0-28.9, adult: Secondary | ICD-10-CM

## 2022-10-01 DIAGNOSIS — E663 Overweight: Secondary | ICD-10-CM | POA: Diagnosis not present

## 2022-10-01 DIAGNOSIS — E782 Mixed hyperlipidemia: Secondary | ICD-10-CM

## 2022-10-01 DIAGNOSIS — I5022 Chronic systolic (congestive) heart failure: Secondary | ICD-10-CM

## 2022-10-01 DIAGNOSIS — E1122 Type 2 diabetes mellitus with diabetic chronic kidney disease: Secondary | ICD-10-CM | POA: Diagnosis not present

## 2022-10-01 DIAGNOSIS — I214 Non-ST elevation (NSTEMI) myocardial infarction: Secondary | ICD-10-CM | POA: Diagnosis not present

## 2022-10-01 DIAGNOSIS — M810 Age-related osteoporosis without current pathological fracture: Secondary | ICD-10-CM

## 2022-10-01 DIAGNOSIS — D75839 Thrombocytosis, unspecified: Secondary | ICD-10-CM

## 2022-10-01 DIAGNOSIS — I10 Essential (primary) hypertension: Secondary | ICD-10-CM

## 2022-10-01 DIAGNOSIS — C50511 Malignant neoplasm of lower-outer quadrant of right female breast: Secondary | ICD-10-CM | POA: Diagnosis not present

## 2022-10-01 DIAGNOSIS — D500 Iron deficiency anemia secondary to blood loss (chronic): Secondary | ICD-10-CM

## 2022-10-01 DIAGNOSIS — I2510 Atherosclerotic heart disease of native coronary artery without angina pectoris: Secondary | ICD-10-CM

## 2022-10-01 DIAGNOSIS — I700 Atherosclerosis of aorta: Secondary | ICD-10-CM | POA: Diagnosis not present

## 2022-10-01 DIAGNOSIS — N1831 Chronic kidney disease, stage 3a: Secondary | ICD-10-CM | POA: Diagnosis not present

## 2022-10-01 LAB — POCT GLYCOSYLATED HEMOGLOBIN (HGB A1C): Hemoglobin A1C: 6.9 % — AB (ref 4.0–5.6)

## 2022-10-01 NOTE — Assessment & Plan Note (Signed)
Compensated Monitor daily weights Reinforced DASH diet Continue carvedilol, losartan and spironolactone C-Met today

## 2022-10-01 NOTE — Patient Instructions (Signed)

## 2022-10-01 NOTE — Assessment & Plan Note (Signed)
Continue Fosamax Encourage daily weightbearing exercise

## 2022-10-01 NOTE — Assessment & Plan Note (Signed)
C-Met and lipid profile today Encouraged to consume low-fat diet Continue rosuvastatin, fenofibrate and cavedilol Continue aspirin

## 2022-10-01 NOTE — Assessment & Plan Note (Signed)
Encourage diet and exercise for weight loss 

## 2022-10-01 NOTE — Assessment & Plan Note (Signed)
C-Met and lipid profile today Encouraged to consume low-fat diet Continue rosuvastatin and fenofibrate Continue aspirin

## 2022-10-01 NOTE — Assessment & Plan Note (Signed)
A1c and urine microalbumin today Encouraged her to consume a low-carb diet and exercise for weight loss Encouraged routine eye exam Encouraged routine foot exam Flu and pneumonia vaccines UTD Encouraged her to get her COVID booster

## 2022-10-01 NOTE — Assessment & Plan Note (Signed)
Controlled on spironolactone, losartan, and carvedilol C-Met today Reinforced DASH diet and exercise for weight loss

## 2022-10-01 NOTE — Assessment & Plan Note (Signed)
Continue letrozole She will continue to follow with oncology

## 2022-10-01 NOTE — Assessment & Plan Note (Signed)
C-Met and lipid profile today Encouraged her to consume low-fat diet Continue rosuvastatin and fenofibrate

## 2022-10-01 NOTE — Assessment & Plan Note (Signed)
CBC today Continue oral iron

## 2022-10-01 NOTE — Progress Notes (Signed)
Subjective:    Patient ID: Janice Liu, female    DOB: 26-Sep-1940, 82 y.o.   MRN: [id number]  HPI  Patient presents to clinic today for follow-up of chronic conditions.  HLD with Aortic Atherosclerosis, CAD status post MI: Her last LDL was 72, triglycerides 192, 03/2022.  She denies myalgias on Rosuvastatin and Fenofibrate.  She is taking Aspirin as well.  She follows with cardiology.  DM2: Her last A1c was 6.2%, 03/2022.  She is not taking any oral diabetic medication at this time.  She does not check her sugars.  She checks her feet routinely.  Her last eye exam was > 1 year ago.  Flu 04/2022.  Pneumovax 03/2019.  Prevnar 03/2021.  Circle x 4.  HTN: Her BP today is 136/68.  She is taking Spironolactone, Losartan, Carvedilol as prescribed.  ECG from 06/2022 reviewed.  CHF, Systolic: She denies chronic cough or SOB, She is taking Carvedilol, Losartan and Spironolactone as prescribed. Echo from 10/2019 reviewed.  History of Right Breast Cancer: s/p lumpectomy and radiation. She is taking Letrozole as prescribed. She follows with oncology.  Iron Deficiency Anemia: Her last H/H was 12.6/37.7, 03/2022. She is taking oral Iron as prescribed. She follows with hematology.  CKD 3: Her last creatinine was 1.26, GFR 43. She is on Losartan for renal protection. She follows with nephrology.  Osteoporosis: She is taking Fosamax as prescribed. She tries to get weight bearing exercise. Bone density from 03/2020 reviewed.  Thrombocytosis: Her last platelet count was 429, 03/2022.  She follows with hematology.  Review of Systems     Past Medical History:  Diagnosis Date   Allergy    Breast cancer ([place])    2021   CAD (coronary artery disease)    Chicken pox    Diabetes mellitus (HCC)    Heart murmur    HFrEF (heart failure with reduced ejection fraction) (HCC)    Hypertension    Personal history of radiation therapy     Current Outpatient Medications  Medication Sig Dispense Refill    acetaminophen (TYLENOL) 500 MG tablet Take 500-1,000 mg by mouth every 6 (six) hours as needed (for pain.).     alendronate (FOSAMAX) 70 MG tablet TAKE 1 TABLET BY MOUTH ONCE A WEEK. TAKE WITH A FULL GLASS OF WATER ON AN EMPTY STOMACH. 12 tablet 3   aspirin EC 81 MG tablet Take 1 tablet (81 mg total) by mouth daily. Swallow whole.     calcium carbonate (OS-CAL - DOSED IN MG OF ELEMENTAL CALCIUM) 1250 (500 Ca) MG tablet Take 500 mg by mouth daily.      carvedilol (COREG) 25 MG tablet TAKE 1 TABLET BY MOUTH TWICE A DAY 180 tablet 1   cyanocobalamin 2000 MCG tablet Take 2,000 mcg by mouth daily.     fenofibrate (TRICOR) 145 MG tablet TAKE 1 TABLET BY MOUTH EVERY DAY 90 tablet 1   ferrous sulfate 325 (65 FE) MG tablet TAKE 1 TABLET BY MOUTH EVERY DAY 90 tablet 0   letrozole (FEMARA) 2.5 MG tablet TAKE 1 TABLET BY MOUTH EVERY DAY 90 tablet 3   losartan (COZAAR) 100 MG tablet TAKE 1 TABLET BY MOUTH EVERY DAY. STOP AMLODIPINE 90 tablet 0   Omega-3 Fatty Acids (FISH OIL) 500 MG CAPS Take 500 mg by mouth daily.     rosuvastatin (CRESTOR) 10 MG tablet TAKE 1 TABLET BY MOUTH EVERY DAY 90 tablet 2   spironolactone (ALDACTONE) 25 MG tablet TAKE 1 TABLET (25  MG TOTAL) BY MOUTH DAILY. 90 tablet 1   No current facility-administered medications for this visit.    No Known Allergies  Family History  Problem Relation Age of Onset   Heart failure Mother    Diabetes Mother    Hypertension Mother    Congestive Heart Failure Brother    Hypertension Brother    Cerebral palsy Son    Congestive Heart Failure Brother    Breast cancer Sister 70   Thyroid cancer Sister     Social History   Socioeconomic History   Marital status: Widowed    Spouse name: Not on file   Number of children: 2   Years of education: Not on file   Highest education level: Not on file  Occupational History   Occupation: Retired  Tobacco Use   Smoking status: Never   Smokeless tobacco: Never  Vaping Use   Vaping Use: Never  used  Substance and Sexual Activity   Alcohol use: No    Alcohol/week: 0.0 standard drinks of alcohol   Drug use: No   Sexual activity: Never  Other Topics Concern   Not on file  Social History Narrative   Not on file   Social Determinants of Health   Financial Resource Strain: Low Risk  (05/27/2022)   Overall Financial Resource Strain (CARDIA)    Difficulty of Paying Living Expenses: Not hard at all  Food Insecurity: No Food Insecurity (05/27/2022)   Hunger Vital Sign    Worried About Running Out of Food in the Last Year: Never true    [place] in the Last Year: Never true  Transportation Needs: No Transportation Needs (05/27/2022)   PRAPARE - Hydrologist (Medical): No    Lack of Transportation (Non-Medical): No  Physical Activity: Insufficiently Active (05/27/2022)   Exercise Vital Sign    Days of Exercise per Week: 3 days    Minutes of Exercise per Session: 30 min  Stress: No Stress Concern Present (05/27/2022)   [place]    Feeling of Stress : Only a little  Social Connections: Socially Isolated (05/27/2022)   Social Connection and Isolation Panel [NHANES]    Frequency of Communication with Friends and Family: More than three times a week    Frequency of Social Gatherings with Friends and Family: More than three times a week    Attends Religious Services: Never    Marine scientist or Organizations: No    Attends Archivist Meetings: Never    Marital Status: Widowed  Intimate Partner Violence: Not At Risk (05/27/2022)   Humiliation, Afraid, Rape, and Kick questionnaire    Fear of Current or Ex-Partner: No    Emotionally Abused: No    Physically Abused: No    Sexually Abused: No     Constitutional: Denies fever, malaise, fatigue, headache or abrupt weight changes.  HEENT: Denies eye pain, eye redness, ear pain, ringing in the ears, wax buildup,  runny nose, nasal congestion, bloody nose, or sore throat. Respiratory: Denies difficulty breathing, shortness of breath, cough or sputum production.   Cardiovascular: Denies chest pain, chest tightness, palpitations or swelling in the hands or feet.  Gastrointestinal: Denies abdominal pain, bloating, constipation, diarrhea or blood in the stool.  GU: Denies urgency, frequency, pain with urination, burning sensation, blood in urine, odor or discharge. Musculoskeletal: Denies decrease in range of motion, difficulty with gait, muscle pain or joint pain and  swelling.  Skin: Patient reports fungal toenails.  Denies redness, rashes, lesions or ulcercations.  Neurological: Denies dizziness, difficulty with memory, difficulty with speech or problems with balance and coordination.  Psych: Denies anxiety, depression, SI/HI.  No other specific complaints in a complete review of systems (except as listed in HPI above).  Objective:   Physical Exam BP 136/68 (BP Location: Left Arm, Patient Position: Sitting, Cuff Size: Large)   Pulse 85   Temp (!) 96.3 F (35.7 C) (Temporal)   Wt 160 lb (72.6 kg)   SpO2 97%   BMI 28.34 kg/m   Wt Readings from Last 3 Encounters:  07/09/22 160 lb (72.6 kg)  06/24/22 159 lb 6.4 oz (72.3 kg)  05/30/22 160 lb (72.6 kg)    General: Appears her stated age, overweight, in NAD. Skin: Warm, dry and intact. No ulcerations noted. HEENT: Head: normal shape and size; Eyes: sclera white, no icterus, conjunctiva pink, PERRLA and EOMs intact;  Neck:  Neck supple, trachea midline. No masses, lumps or thyromegaly present.  Cardiovascular: Normal rate and rhythm. S1,S2 noted.  No murmur, rubs or gallops noted. No JVD or BLE edema. No carotid bruits noted. Pulmonary/Chest: Normal effort and positive vesicular breath sounds. No respiratory distress. No wheezes, rales or ronchi noted.  Abdomen: Soft and nontender. Normal bowel sounds.  Musculoskeletal: Kyphotic.Marland Kitchen  Shuffling gait but  slow and steady without device. Neurological: Alert and oriented. Cranial nerves II-XII grossly intact. Coordination normal.  Psychiatric: Mood and affect normal. Behavior is normal. Judgment and thought content normal.    BMET    Component Value Date/Time   NA 138 03/21/2022 1327   NA 148 (H) 07/10/2021 0938   K 4.7 03/21/2022 1327   CL 104 03/21/2022 1327   CO2 25 03/21/2022 1327   GLUCOSE 111 (H) 03/21/2022 1327   BUN 32 (H) 03/21/2022 1327   BUN 20 07/10/2021 0938   CREATININE 1.26 (H) 03/21/2022 1327   CALCIUM 9.7 03/21/2022 1327   GFRNONAA 49 (L) 12/08/2019 1113   GFRAA 57 (L) 12/08/2019 1113    Lipid Panel     Component Value Date/Time   CHOL 147 03/21/2022 1327   CHOL 274 (H) 08/15/2015 0803   TRIG 192 (H) 03/21/2022 1327   HDL 46 (L) 03/21/2022 1327   HDL 38 (L) 08/15/2015 0803   CHOLHDL 3.2 03/21/2022 1327   VLDL 31.4 03/09/2020 0949   LDLCALC 72 03/21/2022 1327    CBC    Component Value Date/Time   WBC 8.3 03/21/2022 1327   RBC 4.08 03/21/2022 1327   HGB 12.6 03/21/2022 1327   HGB 14.5 10/20/2020 1521   HCT 37.7 03/21/2022 1327   HCT 43.4 10/20/2020 1521   PLT 429 (H) 03/21/2022 1327   PLT 438 10/20/2020 1521   MCV 92.4 03/21/2022 1327   MCV 90 10/20/2020 1521   MCH 30.9 03/21/2022 1327   MCHC 33.4 03/21/2022 1327   RDW 12.1 03/21/2022 1327   RDW 11.9 10/20/2020 1521   LYMPHSABS 2.5 10/20/2020 1521   MONOABS 0.6 12/08/2019 1113   EOSABS 0.2 10/20/2020 1521   BASOSABS 0.0 10/20/2020 1521    Hgb A1C Lab Results  Component Value Date   HGBA1C 6.2 (H) 03/21/2022          Assessment & Plan:      RTC in 6 months, for your annual exam Webb Silversmith, NP

## 2022-10-01 NOTE — Assessment & Plan Note (Signed)
C-Met and lipid profile today Encouraged to consume low-fat diet Continue rosuvastatin, fenofibrate and carvedilol Continue aspirin

## 2022-10-01 NOTE — Assessment & Plan Note (Signed)
CBC today.  

## 2022-10-01 NOTE — Assessment & Plan Note (Signed)
C-Met today Continue losartan for renal protection 

## 2022-10-02 LAB — CBC
HCT: 38.3 % (ref 35.0–45.0)
Hemoglobin: 13 g/dL (ref 11.7–15.5)
MCH: 30.3 pg (ref 27.0–33.0)
MCHC: 33.9 g/dL (ref 32.0–36.0)
MCV: 89.3 fL (ref 80.0–100.0)
MPV: 10.2 fL (ref 7.5–12.5)
Platelets: 438 10*3/uL — ABNORMAL HIGH (ref 140–400)
RBC: 4.29 10*6/uL (ref 3.80–5.10)
RDW: 12.3 % (ref 11.0–15.0)
WBC: 8.2 10*3/uL (ref 3.8–10.8)

## 2022-10-02 LAB — MICROALBUMIN / CREATININE URINE RATIO
Creatinine, Urine: 56 mg/dL (ref 20–275)
Microalb Creat Ratio: 7 mcg/mg creat (ref <30)
Microalb, Ur: 0.4 mg/dL

## 2022-10-02 LAB — LIPID PANEL
Cholesterol: 155 mg/dL (ref <200)
HDL: 47 mg/dL — ABNORMAL LOW (ref >=50)
LDL Cholesterol (Calc): 77 mg/dL (calc)
Non-HDL Cholesterol (Calc): 108 mg/dL (calc) (ref <130)
Total CHOL/HDL Ratio: 3.3 (calc) (ref <5.0)
Triglycerides: 225 mg/dL — ABNORMAL HIGH (ref <150)

## 2022-10-02 LAB — COMPLETE METABOLIC PANEL WITH GFR
AG Ratio: 1.7 (calc) (ref 1.0–2.5)
ALT: 19 U/L (ref 6–29)
AST: 22 U/L (ref 10–35)
Albumin: 4.4 g/dL (ref 3.6–5.1)
Alkaline phosphatase (APISO): 35 U/L — ABNORMAL LOW (ref 37–153)
BUN/Creatinine Ratio: 19 (calc) (ref 6–22)
BUN: 19 mg/dL (ref 7–25)
CO2: 24 mmol/L (ref 20–32)
Calcium: 9.6 mg/dL (ref 8.6–10.4)
Chloride: 107 mmol/L (ref 98–110)
Creat: 1.02 mg/dL — ABNORMAL HIGH (ref 0.60–0.95)
Globulin: 2.6 g/dL (calc) (ref 1.9–3.7)
Glucose, Bld: 116 mg/dL — ABNORMAL HIGH (ref 65–99)
Potassium: 4.6 mmol/L (ref 3.5–5.3)
Sodium: 140 mmol/L (ref 135–146)
Total Bilirubin: 0.4 mg/dL (ref 0.2–1.2)
Total Protein: 7 g/dL (ref 6.1–8.1)
eGFR: 55 mL/min/{1.73_m2} — ABNORMAL LOW (ref >=60)

## 2022-10-02 LAB — HEMOGLOBIN A1C
Hgb A1c MFr Bld: 7 % of total Hgb — ABNORMAL HIGH (ref <5.7)
Mean Plasma Glucose: 154 mg/dL
eAG (mmol/L): 8.5 mmol/L

## 2022-10-04 ENCOUNTER — Telehealth: Payer: Self-pay

## 2022-10-04 NOTE — Telephone Encounter (Signed)
Pt's son Shanon Brow called for Lab results. Shared provider's note:   Jearld Fenton, NP 10/02/2022 11:27 AM EST     Platelets are marginally elevated.  This is not concerning but something we will monitor.  Blood counts otherwise normal.  Your kidney function remains slightly decreased but has improved from your last check.  Your liver enzymes are essentially normal.  Your cholesterol looks good but your triglycerides remain elevated.  Try to consume a low saturated fat diet and increase aerobic exercise.   No questions.

## 2022-10-18 ENCOUNTER — Other Ambulatory Visit: Payer: Self-pay | Admitting: Internal Medicine

## 2022-10-18 DIAGNOSIS — E7800 Pure hypercholesterolemia, unspecified: Secondary | ICD-10-CM

## 2022-11-08 ENCOUNTER — Other Ambulatory Visit: Payer: Self-pay | Admitting: Oncology

## 2022-11-15 ENCOUNTER — Other Ambulatory Visit: Payer: Self-pay | Admitting: Oncology

## 2022-11-27 ENCOUNTER — Other Ambulatory Visit: Payer: Self-pay | Admitting: Internal Medicine

## 2022-11-27 NOTE — Telephone Encounter (Signed)
Requested Prescriptions  Pending Prescriptions Disp Refills   carvedilol (COREG) 25 MG tablet [Pharmacy Med Name: CARVEDILOL 25 MG TABLET] 180 tablet 0    Sig: TAKE 1 TABLET BY MOUTH TWICE A DAY     Cardiovascular: Beta Blockers 3 Failed - 11/27/2022  1:32 AM      Failed - Cr in normal range and within 360 days    Creat  Date Value Ref Range Status  10/01/2022 1.02 (H) 0.60 - 0.95 mg/dL Final   Creatinine, Urine  Date Value Ref Range Status  10/01/2022 56 20 - 275 mg/dL Final         Passed - AST in normal range and within 360 days    AST  Date Value Ref Range Status  10/01/2022 22 10 - 35 U/L Final         Passed - ALT in normal range and within 360 days    ALT  Date Value Ref Range Status  10/01/2022 19 6 - 29 U/L Final         Passed - Last BP in normal range    BP Readings from Last 1 Encounters:  10/01/22 136/68         Passed - Last Heart Rate in normal range    Pulse Readings from Last 1 Encounters:  10/01/22 85         Passed - Valid encounter within last 6 months    Recent Outpatient Visits           1 month ago Type 2 diabetes mellitus with stage 3a chronic kidney disease, without long-term current use of insulin (HCC)   [facility] [place], Salvadore Oxford, NP   6 months ago Toenail fungus   [place] [facility] [place], Salvadore Oxford, NP   8 months ago Encounter for general adult medical examination with abnormal findings   [place] [facility] [place], [place] W, NP   1 year ago Type 2 diabetes mellitus without complication, without long-term current use of insulin [facility])   [place] [facility] [place], Salvadore Oxford, NP   1 year ago Medicare annual wellness visit, subsequent   [place] [facility] [place], Salvadore Oxford, NP       Future Appointments             In 1 month Kirke Corin, Chelsea Aus, MD [facility] [place]   In 4 months Baity, Salvadore Oxford, NP [place]  [facility]

## 2022-12-13 ENCOUNTER — Other Ambulatory Visit: Payer: Self-pay | Admitting: Cardiovascular Disease

## 2022-12-19 ENCOUNTER — Ambulatory Visit
Admission: RE | Admit: 2022-12-19 | Discharge: 2022-12-19 | Disposition: A | Discharge status: Home / Self Care | Payer: Medicare HMO | Source: Ambulatory Visit | Attending: Nurse Practitioner | Admitting: Nurse Practitioner

## 2022-12-19 DIAGNOSIS — Z853 Personal history of malignant neoplasm of breast: Secondary | ICD-10-CM | POA: Insufficient documentation

## 2022-12-19 DIAGNOSIS — Z1231 Encounter for screening mammogram for malignant neoplasm of breast: Secondary | ICD-10-CM | POA: Insufficient documentation

## 2022-12-19 DIAGNOSIS — Z08 Encounter for follow-up examination after completed treatment for malignant neoplasm: Secondary | ICD-10-CM | POA: Insufficient documentation

## 2022-12-23 ENCOUNTER — Encounter: Payer: Self-pay | Admitting: Oncology

## 2022-12-23 ENCOUNTER — Inpatient Hospital Stay: Payer: Medicare HMO | Attending: Oncology | Admitting: Oncology

## 2022-12-23 VITALS — BP 130/67 | HR 83 | Temp 97.1°F | Wt 156.0 lb

## 2022-12-23 DIAGNOSIS — Z79811 Long term (current) use of aromatase inhibitors: Secondary | ICD-10-CM | POA: Insufficient documentation

## 2022-12-23 DIAGNOSIS — Z170 Estrogen receptor positive status [ER+]: Secondary | ICD-10-CM | POA: Diagnosis not present

## 2022-12-23 DIAGNOSIS — Z803 Family history of malignant neoplasm of breast: Secondary | ICD-10-CM | POA: Diagnosis not present

## 2022-12-23 DIAGNOSIS — C50511 Malignant neoplasm of lower-outer quadrant of right female breast: Secondary | ICD-10-CM

## 2022-12-23 DIAGNOSIS — Z923 Personal history of irradiation: Secondary | ICD-10-CM | POA: Diagnosis not present

## 2022-12-23 DIAGNOSIS — M810 Age-related osteoporosis without current pathological fracture: Secondary | ICD-10-CM | POA: Diagnosis not present

## 2022-12-23 NOTE — Progress Notes (Signed)
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  INTERVAL HISTORY: Patient returns to clinic today for routine 45-month evaluation and discussion of her mammogram results.  She currently feels well and is asymptomatic.  She continues to tolerate letrozole and Fosamax well without significant side effects. She has no neurologic complaints.  She denies any recent fevers or illnesses.  She has a good appetite and denies weight loss. She has no chest pain, shortness of breath, cough, or hemoptysis.  She denies any nausea, vomiting, constipation, or diarrhea.  She has no melena or hematochezia.  She has no urinary complaints.  Patient offers no specific complaints today.  REVIEW OF SYSTEMS:   Review of Systems  Constitutional: Negative.  Negative for fever, malaise/fatigue and weight loss.  Respiratory: Negative.  Negative for cough and shortness of breath.   Cardiovascular: Negative.  Negative for chest pain and leg swelling.  Gastrointestinal: Negative.  Negative for abdominal pain, blood in stool and melena.  Genitourinary: Negative.  Negative for hematuria.  Musculoskeletal: Negative.  Negative for back pain.  Skin: Negative.  Negative for rash.  Neurological: Negative.  Negative for dizziness, focal weakness, weakness and headaches.  Psychiatric/Behavioral: Negative.  The patient is not nervous/anxious.     As per HPI.  Otherwise, a complete review of systems is negative.  PAST MEDICAL HISTORY: Past Medical History:  Diagnosis Date   Allergy    Breast cancer (HCC)    2021   CAD (coronary artery disease)    Chicken pox    Diabetes mellitus (HCC)    Heart murmur    HFrEF (heart failure with reduced ejection fraction) (HCC)    Hypertension    Personal history of radiation therapy     PAST SURGICAL HISTORY: Past Surgical History:  Procedure Laterality Date   BREAST BIOPSY Right 11/04/2019   Affirm Bx- positive- Ribbon Clip   BREAST BIOPSY Left 11/04/2019   Affirm Bx- neg- Coil Clip   BREAST BIOPSY Left 11/12/2019   [place] bx/ ribbon clip/ path pending   BREAST CYST ASPIRATION Left 11/12/2019   2 areas   BREAST LUMPECTOMY Right 12/10/2019   Procedure: BREAST LUMPECTOMY;  Surgeon: Campbell Lerner, MD;  Location: [facility] ORS;  Service: General;  Laterality: Right;   CARDIAC CATHETERIZATION     CORONARY STENT INTERVENTION N/A 08/09/2019   Procedure: CORONARY STENT INTERVENTION;  Surgeon: Yvonne Kendall, MD;  Location: [facility] INVASIVE CV LAB;  Service: Cardiovascular;  Laterality: N/A;   LEFT HEART CATH AND CORONARY ANGIOGRAPHY N/A 08/09/2019   Procedure: LEFT HEART CATH AND CORONARY ANGIOGRAPHY;  Surgeon: Yvonne Kendall, MD;  Location: [facility] INVASIVE CV LAB;  Service: Cardiovascular;  Laterality: N/A;    FAMILY HISTORY: Family History  Problem Relation Age of Onset   Heart failure Mother    Diabetes Mother    Hypertension Mother    Congestive Heart Failure Brother    Hypertension Brother    Cerebral palsy Son    Congestive Heart Failure Brother    Breast cancer  Sister 62   Thyroid cancer Sister     ADVANCED DIRECTIVES (Y/N):  N  HEALTH MAINTENANCE: Social History   Tobacco Use   Smoking status: Never   Smokeless tobacco: Never  Vaping Use   Vaping Use: Never used  Substance Use Topics   Alcohol use: No    Alcohol/week: 0.0 standard drinks of alcohol   Drug use: No      Colonoscopy:  PAP:  Bone density:  Lipid panel:  No Known Allergies  Current Outpatient Medications  Medication Sig Dispense Refill   acetaminophen (TYLENOL) 500 MG tablet Take 500-1,000 mg by mouth every 6 (six) hours as needed (for pain.).     alendronate (FOSAMAX) 70 MG tablet TAKE 1 TABLET BY MOUTH ONCE A WEEK. TAKE WITH A FULL GLASS OF WATER ON AN EMPTY STOMACH. 12 tablet 3   aspirin EC 81 MG tablet Take 1 tablet (81 mg total) by mouth daily. Swallow whole.     calcium carbonate (OS-CAL - DOSED IN MG OF ELEMENTAL CALCIUM) 1250 (500 Ca) MG tablet Take 500 mg by mouth daily.      carvedilol (COREG) 25 MG tablet TAKE 1 TABLET BY MOUTH TWICE A DAY 180 tablet 0   cyanocobalamin 2000 MCG tablet Take 2,000 mcg by mouth daily.     fenofibrate (TRICOR) 145 MG tablet TAKE 1 TABLET BY MOUTH EVERY DAY 90 tablet 1   ferrous sulfate 325 (65 FE) MG tablet TAKE 1 TABLET BY MOUTH EVERY DAY 90 tablet 0   letrozole (FEMARA) 2.5 MG tablet TAKE 1 TABLET BY MOUTH EVERY DAY 90 tablet 3   losartan (COZAAR) 100 MG tablet TAKE 1 TABLET BY MOUTH EVERY DAY. STOP AMLODIPINE 90 tablet 0   Omega-3 Fatty Acids (FISH OIL) 500 MG CAPS Take 500 mg by mouth daily.     rosuvastatin (CRESTOR) 10 MG tablet TAKE 1 TABLET BY MOUTH EVERY DAY 90 tablet 1   spironolactone (ALDACTONE) 25 MG tablet TAKE 1 TABLET (25 MG TOTAL) BY MOUTH DAILY. 90 tablet 1   No current facility-administered medications for this visit.    OBJECTIVE: Vitals:   12/23/22 1050  BP: 130/67  Pulse: 83  Temp: (!) 97.1 F (36.2 C)  SpO2: 97%     Body mass index is 27.63 kg/m.    ECOG FS:0 - Asymptomatic  General: Well-developed, well-nourished, no acute distress. Eyes: Pink conjunctiva, anicteric sclera. HEENT: Normocephalic, moist mucous membranes. Lungs: No audible wheezing or coughing. Heart: Regular rate and rhythm. Abdomen: Soft, nontender, no obvious distention. Musculoskeletal: No edema, cyanosis, or clubbing. Neuro: Alert,  answering all questions appropriately. Cranial nerves grossly intact. Skin: No rashes or petechiae noted. Psych: Normal affect.  LAB RESULTS:  Lab Results  Component Value Date   NA 140 10/01/2022   K 4.6 10/01/2022   CL 107 10/01/2022   CO2 24 10/01/2022   GLUCOSE 116 (H) 10/01/2022   BUN 19 10/01/2022   CREATININE 1.02 (H) 10/01/2022   CALCIUM 9.6 10/01/2022   PROT 7.0 10/01/2022   ALBUMIN 4.5 03/09/2020   AST 22 10/01/2022   ALT 19 10/01/2022   ALKPHOS 63 03/09/2020   BILITOT 0.4 10/01/2022   GFRNONAA 49 (L) 12/08/2019   GFRAA 57 (L) 12/08/2019    Lab Results  Component Value Date   WBC 8.2 10/01/2022   NEUTROABS 5.8 10/20/2020   HGB 13.0 10/01/2022   HCT 38.3 10/01/2022   MCV 89.3 10/01/2022   PLT 438 (H) 10/01/2022   Lab Results  Component  Value Date   IRON 74 10/20/2020   TIBC 363 10/20/2020   IRONPCTSAT 20 10/20/2020   Lab Results  Component Value Date   FERRITIN 147 10/20/2020     STUDIES: MM 3D SCREEN BREAST BILATERAL  Result Date: 12/20/2022 CLINICAL DATA:  Screening. EXAM: DIGITAL SCREENING BILATERAL MAMMOGRAM WITH TOMOSYNTHESIS AND CAD TECHNIQUE: Bilateral screening digital craniocaudal and mediolateral oblique mammograms were obtained. Bilateral screening digital breast tomosynthesis was performed. The images were evaluated with computer-aided detection. COMPARISON:  Previous exam(s). ACR Breast Density Category b: There are scattered areas of fibroglandular density. FINDINGS: There are no findings suspicious for malignancy. IMPRESSION: No mammographic evidence of malignancy. A result letter of this screening mammogram will be mailed directly to the patient. RECOMMENDATION: Screening mammogram in one year. (Code:SM-B-01Y) BI-RADS CATEGORY  1: Negative. Electronically Signed   By: Elberta Fortis M.D.   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  Given the fact that she is grade 1 and mucinous, Oncotype DX was not necessary.  Patient completed adjuvant XRT in July 2021.  Continue letrozole for a total of 5 years completing treatment in July 2026.  Patient's most recent mammogram on Dec 20, 2022 was reported as BI-RADS 1.  Repeat in May 2025.  Return to clinic in 6 months for routine evaluation.   Osteoporosis: Patient's most recent bone mineral density on April 17, 2022 was essentially unchanged with a reported T-score of -3.1.  Continue Fosamax, calcium, and vitamin D.  Patient wishes to coordinate her mammogram and bone density, therefore we will schedule next bone density in May 2025 along with her mammogram.    I spent a total of 20 minutes reviewing chart data, face-to-face evaluation with the patient, counseling and coordination of care as detailed above.  Patient expressed understanding and was in agreement with this plan. She also understands that She can call clinic at any time with any questions, concerns, or complaints.    Cancer Staging  Carcinoma of lower-outer quadrant of female breast, right [facility]) Staging form: Breast, AJCC 8th Edition - Clinical stage from 01/07/2020: Stage IB (cT2, cN0, cM0, G1, ER+, PR+, HER2-) - Signed by Jeralyn Ruths, MD on 01/07/2020 Stage prefix: Initial diagnosis Histologic grading system: 3 grade system   Jeralyn Ruths, MD   12/23/2022 1:30 PM

## 2022-12-25 ENCOUNTER — Other Ambulatory Visit: Payer: Self-pay | Admitting: Hematology and Oncology

## 2023-01-07 ENCOUNTER — Encounter: Payer: Self-pay | Admitting: Cardiovascular Disease

## 2023-01-07 ENCOUNTER — Ambulatory Visit: Payer: Medicare HMO | Attending: Cardiovascular Disease | Admitting: Cardiovascular Disease

## 2023-01-07 VITALS — BP 120/60 | HR 79 | Ht 63.0 in | Wt 154.2 lb

## 2023-01-07 DIAGNOSIS — I5022 Chronic systolic (congestive) heart failure: Secondary | ICD-10-CM

## 2023-01-07 DIAGNOSIS — E782 Mixed hyperlipidemia: Secondary | ICD-10-CM

## 2023-01-07 DIAGNOSIS — I255 Ischemic cardiomyopathy: Secondary | ICD-10-CM

## 2023-01-07 DIAGNOSIS — I2510 Atherosclerotic heart disease of native coronary artery without angina pectoris: Secondary | ICD-10-CM | POA: Diagnosis not present

## 2023-01-07 DIAGNOSIS — I10 Essential (primary) hypertension: Secondary | ICD-10-CM | POA: Diagnosis not present

## 2023-01-07 MED ORDER — ROSUVASTATIN CALCIUM 20 MG PO TABS
20.0000 mg | ORAL_TABLET | Freq: Every day | ORAL | 3 refills | Status: DC
Start: 2023-01-07 — End: 2023-04-16

## 2023-01-07 NOTE — Progress Notes (Signed)
Cardiology Office Note   Date:  01/07/2023   ID:  Janice Liu, DOB December 06, 1940, MRN [id number]  PCP:  Janice Munroe, NP  Cardiologist:   Lorine Bears, MD   Chief Complaint  Patient presents with   Follow-up    6 month f/u no complaints today. Meds reviewed verbally with pt.      History of Present Illness: Janice Liu is a 82 y.o. female who presents for a follow-up visit regarding coronary artery disease and chronic systolic heart failure.   She has chronic medical conditions that include essential hypertension, hyperlipidemia and type 2 diabetes. She presented in December 2020 with non-ST elevation myocardial infarction.  Peak troponin was greater than 27,000.  Cardiac catheterization showed severe one-vessel coronary artery disease with 90% ulcerative stenosis in the mid LAD and moderate proximal LAD and distal RCA disease.  Ejection fraction was 40 to 45% with mildly elevated left ventricular end-diastolic pressure.  She underwent successful angioplasty and drug-eluting stent placement to the mid LAD.  She had a follow-up echocardiogram in March, 2021 that showed improvement in ejection fraction to 50%.  She underwent right lumpectomy in April 2021 for breast cancer.  He has been doing very well with no recent chest pain, shortness of breath or palpitations.  She is compliant with her medications.  Past Medical History:  Diagnosis Date   Allergy    Breast cancer (HCC)    2021   CAD (coronary artery disease)    Chicken pox    Diabetes mellitus (HCC)    Heart murmur    HFrEF (heart failure with reduced ejection fraction) (HCC)    Hypertension    Personal history of radiation therapy     Past Surgical History:  Procedure Laterality Date   BREAST BIOPSY Right 11/04/2019   Affirm Bx- positive- Ribbon Clip   BREAST BIOPSY Left 11/04/2019   Affirm Bx- neg- Coil Clip   BREAST BIOPSY Left 11/12/2019   [place] bx/ ribbon clip/ path pending   BREAST CYST ASPIRATION Left  11/12/2019   2 areas   BREAST LUMPECTOMY Right 12/10/2019   Procedure: BREAST LUMPECTOMY;  Surgeon: Campbell Lerner, MD;  Location: [facility] ORS;  Service: General;  Laterality: Right;   CARDIAC CATHETERIZATION     CORONARY STENT INTERVENTION N/A 08/09/2019   Procedure: CORONARY STENT INTERVENTION;  Surgeon: Yvonne Kendall, MD;  Location: [facility] INVASIVE CV LAB;  Service: Cardiovascular;  Laterality: N/A;   LEFT HEART CATH AND CORONARY ANGIOGRAPHY N/A 08/09/2019   Procedure: LEFT HEART CATH AND CORONARY ANGIOGRAPHY;  Surgeon: Yvonne Kendall, MD;  Location: [facility] INVASIVE CV LAB;  Service: Cardiovascular;  Laterality: N/A;     Current Outpatient Medications  Medication Sig Dispense Refill   acetaminophen (TYLENOL) 500 MG tablet Take 500-1,000 mg by mouth every 6 (six) hours as needed (for pain.).     alendronate (FOSAMAX) 70 MG tablet TAKE 1 TABLET BY MOUTH ONCE A WEEK. TAKE WITH A FULL GLASS OF WATER ON AN EMPTY STOMACH. 12 tablet 3   aspirin EC 81 MG tablet Take 1 tablet (81 mg total) by mouth daily. Swallow whole.     calcium carbonate (OS-CAL - DOSED IN MG OF ELEMENTAL CALCIUM) 1250 (500 Ca) MG tablet Take 500 mg by mouth daily.      carvedilol (COREG) 25 MG tablet TAKE 1 TABLET BY MOUTH TWICE A DAY 180 tablet 0   cyanocobalamin 2000 MCG tablet Take 2,000 mcg by mouth daily.     fenofibrate (TRICOR)  145 MG tablet TAKE 1 TABLET BY MOUTH EVERY DAY 90 tablet 1   ferrous sulfate 325 (65 FE) MG tablet TAKE 1 TABLET BY MOUTH EVERY DAY 90 tablet 0   letrozole (FEMARA) 2.5 MG tablet TAKE 1 TABLET BY MOUTH EVERY DAY 90 tablet 3   losartan (COZAAR) 100 MG tablet TAKE 1 TABLET BY MOUTH EVERY DAY. STOP AMLODIPINE 90 tablet 0   Omega-3 Fatty Acids (FISH OIL) 500 MG CAPS Take 500 mg by mouth daily.     spironolactone (ALDACTONE) 25 MG tablet TAKE 1 TABLET (25 MG TOTAL) BY MOUTH DAILY. 90 tablet 1   rosuvastatin (CRESTOR) 20 MG tablet Take 1 tablet (20 mg total) by mouth daily. 90 tablet 3   No current  facility-administered medications for this visit.    Allergies:   Patient has no known allergies.    Social History:  The patient  reports that she has never smoked. She has never used smokeless tobacco. She reports that she does not drink alcohol and does not use drugs.   Family History:  The patient's family history includes Breast cancer (age of onset: 64) in her sister; Cerebral palsy in her son; Congestive Heart Failure in her brother and brother; Diabetes in her mother; Heart failure in her mother; Hypertension in her brother and mother; Thyroid cancer in her sister.    ROS:  Please see the history of present illness.   Otherwise, review of systems are positive for none.   All other systems are reviewed and negative.    PHYSICAL EXAM: VS:  BP 120/60 (BP Location: Left Arm, Patient Position: Sitting, Cuff Size: Normal)   Pulse 79   Ht 5\' 3"  (1.6 m)   Wt 154 lb 4 oz (70 kg)   SpO2 98%   BMI 27.32 kg/m  , BMI Body mass index is 27.32 kg/m. GEN: Well nourished, well developed, in no acute distress  HEENT: normal  Neck: no JVD, carotid bruits, or masses Cardiac: RRR; no murmurs, rubs, or gallops, mild bilateral leg edema edema  Respiratory:  clear to auscultation bilaterally, normal work of breathing GI: soft, nontender, nondistended, + BS MS: no deformity or atrophy  Skin: warm and dry, no rash Neuro:  Strength and sensation are intact Psych: euthymic mood, full affect   EKG:  EKG is ordered today. The ekg ordered today demonstrates normal sinus rhythm with old septal infarct.   Recent Labs: 03/21/2022: TSH 3.86 10/01/2022: ALT 19; BUN 19; Creat 1.02; Hemoglobin 13.0; Platelets 438; Potassium 4.6; Sodium 140    Lipid Panel    Component Value Date/Time   CHOL 155 10/01/2022 1426   CHOL 274 (H) 08/15/2015 0803   TRIG 225 (H) 10/01/2022 1426   HDL 47 (L) 10/01/2022 1426   HDL 38 (L) 08/15/2015 0803   CHOLHDL 3.3 10/01/2022 1426   VLDL 31.4 03/09/2020 0949   LDLCALC  77 10/01/2022 1426   LDLDIRECT 87.0 07/01/2019 0916      Wt Readings from Last 3 Encounters:  01/07/23 154 lb 4 oz (70 kg)  12/23/22 156 lb (70.8 kg)  10/01/22 160 lb (72.6 kg)       ASSESSMENT AND PLAN:  1.  Coronary artery disease involving native coronary arteries without angina: She is doing extremely well at the present time with no anginal symptoms.  Continue medical therapy and aspirin 81 mg daily.  2.  Chronic systolic heart failure due to ischemic cardiomyopathy: Ejection fraction was 40 to 45% but improved to 50% on  most recent echocardiogram.  Continue carvedilol, losartan and spironolactone.  I reviewed her most recent labs done in February which showed stable renal function with GFR of 55.  3.  Essential hypertension: Blood pressure is well controlled on current medications.  4.  Mixed hyperlipidemia: Most recent lipid profile showed an LDL of 77 and triglyceride of 225.  I increased rosuvastatin to 20 mg daily.  She is also on fenofibrate.  5.  Breast cancer: Followed by oncology and currently is on Femara.   Disposition:   Follow-up in 12 months.   Signed,  Lorine Bears, MD  01/07/2023 2:33 PM    [facility]

## 2023-01-07 NOTE — Patient Instructions (Signed)
Medication Instructions:  INCREASE the Rosuvastatin (Crestor) to 20 mg once daily  *If you need a refill on your cardiac medications before your next appointment, please call your pharmacy*   Lab Work: None ordered If you have labs (blood work) drawn today and your tests are completely normal, you will receive your results only by: MyChart Message (if you have MyChart) OR A paper copy in the mail If you have any lab test that is abnormal or we need to change your treatment, we will call you to review the results.   Testing/Procedures: None ordered   Follow-Up: At [facility], you and your health needs are our priority.  As part of our continuing mission to provide you with exceptional heart care, we have created designated Provider Care Teams.  These Care Teams include your primary Cardiologist (physician) and Advanced Practice Providers (APPs -  Physician Assistants and Nurse Practitioners) who all work together to provide you with the care you need, when you need it.  We recommend signing up for the patient portal called "MyChart".  Sign up information is provided on this After Visit Summary.  MyChart is used to connect with patients for Virtual Visits (Telemedicine).  Patients are able to view lab/test results, encounter notes, upcoming appointments, etc.  Non-urgent messages can be sent to your provider as well.   To learn more about what you can do with MyChart, go to [email or web address].    Your next appointment:   12 month(s)  Provider:   You may see Lorine Bears, MD or one of the following Advanced Practice Providers on your designated Care Team:   Nicolasa Ducking, NP Eula Listen, PA-C Cadence Fransico Michael, PA-C Charlsie Quest, NP

## 2023-01-15 ENCOUNTER — Other Ambulatory Visit: Payer: Self-pay | Admitting: Internal Medicine

## 2023-01-15 NOTE — Telephone Encounter (Signed)
Requested Prescriptions  Pending Prescriptions Disp Refills   ferrous sulfate 325 (65 FE) MG tablet [Pharmacy Med Name: FERROUS SULFATE 325 MG TABLET] 90 tablet 0    Sig: TAKE 1 TABLET BY MOUTH EVERY DAY     Endocrinology:  Minerals - Iron Supplementation Failed - 01/15/2023  2:25 AM      Failed - Fe (serum) in normal range and within 360 days    Iron  Date Value Ref Range Status  10/20/2020 74 27 - 139 ug/dL Final   Iron Saturation  Date Value Ref Range Status  10/20/2020 20 15 - 55 % Final         Failed - Ferritin in normal range and within 360 days    Ferritin  Date Value Ref Range Status  10/20/2020 147 15 - 150 ng/mL Final         Passed - HGB in normal range and within 360 days    Hemoglobin  Date Value Ref Range Status  10/01/2022 13.0 11.7 - 15.5 g/dL Final  16/05/9603 54.0 11.1 - 15.9 g/dL Final         Passed - HCT in normal range and within 360 days    HCT  Date Value Ref Range Status  10/01/2022 38.3 35.0 - 45.0 % Final   Hematocrit  Date Value Ref Range Status  10/20/2020 43.4 34.0 - 46.6 % Final         Passed - RBC in normal range and within 360 days    RBC  Date Value Ref Range Status  10/01/2022 4.29 3.80 - 5.10 Million/uL Final         Passed - Valid encounter within last 12 months    Recent Outpatient Visits           3 months ago Type 2 diabetes mellitus with stage 3a chronic kidney disease, without long-term current use of insulin (HCC)   [place] [facility] [place], Salvadore Oxford, NP   7 months ago Toenail fungus   [place] [facility] [place], Salvadore Oxford, NP   10 months ago Encounter for general adult medical examination with abnormal findings   [place] [facility] [place], [place] W, NP   1 year ago Type 2 diabetes mellitus without complication, without long-term current use of insulin [facility])   [place] [facility] [place], Salvadore Oxford, NP   1 year ago Medicare annual wellness  visit, subsequent   [place] [facility] [place], Salvadore Oxford, NP       Future Appointments             In 2 months Baity, Salvadore Oxford, NP  [facility], [facility]

## 2023-02-24 ENCOUNTER — Other Ambulatory Visit: Payer: Self-pay | Admitting: Internal Medicine

## 2023-02-24 NOTE — Telephone Encounter (Signed)
Requested Prescriptions  Pending Prescriptions Disp Refills   carvedilol (COREG) 25 MG tablet [Pharmacy Med Name: CARVEDILOL 25 MG TABLET] 180 tablet 0    Sig: TAKE 1 TABLET BY MOUTH TWICE A DAY     Cardiovascular: Beta Blockers 3 Failed - 02/24/2023  2:29 AM      Failed - Cr in normal range and within 360 days    Creat  Date Value Ref Range Status  10/01/2022 1.02 (H) 0.60 - 0.95 mg/dL Final   Creatinine, Urine  Date Value Ref Range Status  10/01/2022 56 20 - 275 mg/dL Final         Passed - AST in normal range and within 360 days    AST  Date Value Ref Range Status  10/01/2022 22 10 - 35 U/L Final         Passed - ALT in normal range and within 360 days    ALT  Date Value Ref Range Status  10/01/2022 19 6 - 29 U/L Final         Passed - Last BP in normal range    BP Readings from Last 1 Encounters:  01/07/23 120/60         Passed - Last Heart Rate in normal range    Pulse Readings from Last 1 Encounters:  01/07/23 79         Passed - Valid encounter within last 6 months    Recent Outpatient Visits           4 months ago Type 2 diabetes mellitus with stage 3a chronic kidney disease, without long-term current use of insulin (HCC)   [place] [facility] [place], Salvadore Oxford, NP   9 months ago Toenail fungus   [place] [facility] [place], Salvadore Oxford, NP   11 months ago Encounter for general adult medical examination with abnormal findings   [place] [facility] [place], [place] W, NP   1 year ago Type 2 diabetes mellitus without complication, without long-term current use of insulin [facility])   [place] [facility] [place], Salvadore Oxford, NP   1 year ago Medicare annual wellness visit, subsequent   [place] [facility] [place], Salvadore Oxford, NP       Future Appointments             In 1 month Baity, Salvadore Oxford, NP  [facility], [facility]

## 2023-03-02 ENCOUNTER — Other Ambulatory Visit: Payer: Self-pay | Admitting: Internal Medicine

## 2023-03-02 ENCOUNTER — Other Ambulatory Visit: Payer: Self-pay | Admitting: Cardiovascular Disease

## 2023-03-04 NOTE — Telephone Encounter (Signed)
Unable to refill per protocol, Rx request is too soon.   Requested Prescriptions  Pending Prescriptions Disp Refills   ferrous sulfate 325 (65 FE) MG tablet [Pharmacy Med Name: FERROUS SULFATE 325 MG TABLET] 90 tablet 0    Sig: TAKE 1 TABLET BY MOUTH EVERY DAY     Endocrinology:  Minerals - Iron Supplementation Failed - 03/02/2023 10:20 AM      Failed - Fe (serum) in normal range and within 360 days    Iron  Date Value Ref Range Status  10/20/2020 74 27 - 139 ug/dL Final   Iron Saturation  Date Value Ref Range Status  10/20/2020 20 15 - 55 % Final         Failed - Ferritin in normal range and within 360 days    Ferritin  Date Value Ref Range Status  10/20/2020 147 15 - 150 ng/mL Final         Passed - HGB in normal range and within 360 days    Hemoglobin  Date Value Ref Range Status  10/01/2022 13.0 11.7 - 15.5 g/dL Final  60/45/4098 11.9 11.1 - 15.9 g/dL Final         Passed - HCT in normal range and within 360 days    HCT  Date Value Ref Range Status  10/01/2022 38.3 35.0 - 45.0 % Final   Hematocrit  Date Value Ref Range Status  10/20/2020 43.4 34.0 - 46.6 % Final         Passed - RBC in normal range and within 360 days    RBC  Date Value Ref Range Status  10/01/2022 4.29 3.80 - 5.10 Million/uL Final         Passed - Valid encounter within last 12 months    Recent Outpatient Visits           5 months ago Type 2 diabetes mellitus with stage 3a chronic kidney disease, without long-term current use of insulin (HCC)   [place] [facility] [place], Salvadore Oxford, NP   9 months ago Toenail fungus   [facility] [place], Salvadore Oxford, NP   11 months ago Encounter for general adult medical examination with abnormal findings   [place] [place], [place] W, NP   1 year ago Type 2 diabetes mellitus without complication, without long-term current use of insulin [facility])   [place] [facility]  [place], Salvadore Oxford, NP   1 year ago Medicare annual wellness visit, subsequent   [place] [facility] [place], [place], NP       Future Appointments             In 4 weeks [place], Salvadore Oxford, NP [facility] [facility]             fenofibrate (TRICOR) 145 MG tablet [Pharmacy Med Name: FENOFIBRATE 145 MG TABLET] 90 tablet 1    Sig: TAKE 1 TABLET BY MOUTH EVERY DAY     Cardiovascular:  Antilipid - Fibric Acid Derivatives Failed - 03/02/2023 10:20 AM      Failed - Cr in normal range and within 360 days    Creat  Date Value Ref Range Status  10/01/2022 1.02 (H) 0.60 - 0.95 mg/dL Final   Creatinine, Urine  Date Value Ref Range Status  10/01/2022 56 20 - 275 mg/dL Final         Failed - PLT in  normal range and within 360 days    Platelets  Date Value Ref Range Status  10/01/2022 438 (H) 140 - 400 Thousand/uL Final  10/20/2020 438 150 - 450 x10E3/uL Final         Failed - Lipid Panel in normal range within the last 12 months    Cholesterol, Total  Date Value Ref Range Status  08/15/2015 274 (H) 100 - 199 mg/dL Final   Cholesterol  Date Value Ref Range Status  10/01/2022 155 <200 mg/dL Final   LDL Cholesterol (Calc)  Date Value Ref Range Status  10/01/2022 77 mg/dL (calc) Final    Comment:    Reference range: <100 . Desirable range <100 mg/dL for primary prevention;   <70 mg/dL for patients with CHD or diabetic patients  with > or = 2 CHD risk factors. Marland Kitchen LDL-C is now calculated using the Martin-Hopkins  calculation, which is a validated novel method providing  better accuracy than the Friedewald equation in the  estimation of LDL-C.  Horald Pollen et al. Lenox Ahr. 6295;284(13): 2061-2068  (http://education.[facility].com/faq/FAQ164)    Direct LDL  Date Value Ref Range Status  07/01/2019 87.0 mg/dL Final    Comment:    Optimal:  <100 mg/dLNear or Above Optimal:  100-129 mg/dLBorderline High:  130-159 mg/dLHigh:  160-189  mg/dLVery High:  >190 mg/dL   HDL  Date Value Ref Range Status  10/01/2022 47 (L) > OR = 50 mg/dL Final  24/40/1027 38 (L) >39 mg/dL Final   Triglycerides  Date Value Ref Range Status  10/01/2022 225 (H) <150 mg/dL Final    Comment:    . If a non-fasting specimen was collected, consider repeat triglyceride testing on a fasting specimen if clinically indicated.  Perry Mount et al. J. of Clin. Lipidol. 2015;9:129-169. Marland Kitchen          Passed - ALT in normal range and within 360 days    ALT  Date Value Ref Range Status  10/01/2022 19 6 - 29 U/L Final         Passed - AST in normal range and within 360 days    AST  Date Value Ref Range Status  10/01/2022 22 10 - 35 U/L Final         Passed - HGB in normal range and within 360 days    Hemoglobin  Date Value Ref Range Status  10/01/2022 13.0 11.7 - 15.5 g/dL Final  25/36/6440 34.7 11.1 - 15.9 g/dL Final         Passed - HCT in normal range and within 360 days    HCT  Date Value Ref Range Status  10/01/2022 38.3 35.0 - 45.0 % Final   Hematocrit  Date Value Ref Range Status  10/20/2020 43.4 34.0 - 46.6 % Final         Passed - WBC in normal range and within 360 days    WBC  Date Value Ref Range Status  10/01/2022 8.2 3.8 - 10.8 Thousand/uL Final         Passed - eGFR is 30 or above and within 360 days    GFR calc Af Amer  Date Value Ref Range Status  12/08/2019 57 (L) >60 mL/min Final   GFR calc non Af Amer  Date Value Ref Range Status  12/08/2019 49 (L) >60 mL/min Final   GFR  Date Value Ref Range Status  07/14/2020 49.90 (L) >60.00 mL/min Final    Comment:    Calculated using the CKD-EPI Creatinine Equation (2021)  eGFR  Date Value Ref Range Status  10/01/2022 55 (L) > OR = 60 mL/min/1.22m2 Final  07/10/2021 55 (L) >59 mL/min/1.73 Final         Passed - Valid encounter within last 12 months    Recent Outpatient Visits           5 months ago Type 2 diabetes mellitus with stage 3a chronic kidney  disease, without long-term current use of insulin [facility])   [place] [facility] [place], Salvadore Oxford, NP   9 months ago Toenail fungus   [place] [facility] [place], Salvadore Oxford, NP   11 months ago Encounter for general adult medical examination with abnormal findings   [place] [facility] [place], [place] W, NP   1 year ago Type 2 diabetes mellitus without complication, without long-term current use of insulin [facility])   [facility] [place], Salvadore Oxford, NP   1 year ago Medicare annual wellness visit, subsequent   [place] [facility] [place], Salvadore Oxford, NP       Future Appointments             In 4 weeks Sampson Si, Salvadore Oxford, NP [place] [facility], [facility]

## 2023-03-05 ENCOUNTER — Other Ambulatory Visit: Payer: Self-pay | Admitting: Internal Medicine

## 2023-03-05 DIAGNOSIS — E7800 Pure hypercholesterolemia, unspecified: Secondary | ICD-10-CM

## 2023-03-06 NOTE — Telephone Encounter (Signed)
Requested Prescriptions  Refused Prescriptions Disp Refills   rosuvastatin (CRESTOR) 10 MG tablet [Pharmacy Med Name: ROSUVASTATIN CALCIUM 10 MG TAB] 90 tablet 1    Sig: TAKE 1 TABLET BY MOUTH EVERY DAY     Cardiovascular:  Antilipid - Statins 2 Failed - 03/05/2023  2:34 PM      Failed - Cr in normal range and within 360 days    Creat  Date Value Ref Range Status  10/01/2022 1.02 (H) 0.60 - 0.95 mg/dL Final   Creatinine, Urine  Date Value Ref Range Status  10/01/2022 56 20 - 275 mg/dL Final         Failed - Lipid Panel in normal range within the last 12 months    Cholesterol, Total  Date Value Ref Range Status  08/15/2015 274 (H) 100 - 199 mg/dL Final   Cholesterol  Date Value Ref Range Status  10/01/2022 155 <200 mg/dL Final   LDL Cholesterol (Calc)  Date Value Ref Range Status  10/01/2022 77 mg/dL (calc) Final    Comment:    Reference range: <100 . Desirable range <100 mg/dL for primary prevention;   <70 mg/dL for patients with CHD or diabetic patients  with > or = 2 CHD risk factors. Marland Kitchen LDL-C is now calculated using the Martin-Hopkins  calculation, which is a validated novel method providing  better accuracy than the Friedewald equation in the  estimation of LDL-C.  Horald Pollen et al. Lenox Ahr. 5784;696(29): 2061-2068  (http://education.[facility].com/faq/FAQ164)    Direct LDL  Date Value Ref Range Status  07/01/2019 87.0 mg/dL Final    Comment:    Optimal:  <100 mg/dLNear or Above Optimal:  100-129 mg/dLBorderline High:  130-159 mg/dLHigh:  160-189 mg/dLVery High:  >190 mg/dL   HDL  Date Value Ref Range Status  10/01/2022 47 (L) > OR = 50 mg/dL Final  52/84/1324 38 (L) >39 mg/dL Final   Triglycerides  Date Value Ref Range Status  10/01/2022 225 (H) <150 mg/dL Final    Comment:    . If a non-fasting specimen was collected, consider repeat triglyceride testing on a fasting specimen if clinically indicated.  Perry Mount et al. J. of Clin. Lipidol.  2015;9:129-169. Marland Kitchen          Passed - Patient is not pregnant      Passed - Valid encounter within last 12 months    Recent Outpatient Visits           5 months ago Type 2 diabetes mellitus with stage 3a chronic kidney disease, without long-term current use of insulin [facility])   [place] [facility] [place], Salvadore Oxford, NP   9 months ago Toenail fungus   [place] [facility] [place], Salvadore Oxford, NP   11 months ago Encounter for general adult medical examination with abnormal findings   [place] [facility] [place], [place], NP   1 year ago Type 2 diabetes mellitus without complication, without long-term current use of insulin [facility])   [place] [facility] [place], Salvadore Oxford, NP   1 year ago Medicare annual wellness visit, subsequent   [place] [facility] [place], Salvadore Oxford, NP       Future Appointments             In 3 weeks Sampson Si, Salvadore Oxford, NP  [facility], [facility]

## 2023-03-21 ENCOUNTER — Other Ambulatory Visit: Payer: Self-pay | Admitting: Internal Medicine

## 2023-03-21 DIAGNOSIS — E7800 Pure hypercholesterolemia, unspecified: Secondary | ICD-10-CM

## 2023-03-24 NOTE — Telephone Encounter (Signed)
Requests are too soon for refill.  Requested Prescriptions  Pending Prescriptions Disp Refills   ferrous sulfate 325 (65 FE) MG tablet [Pharmacy Med Name: FERROUS SULFATE 325 MG TABLET] 90 tablet 0    Sig: TAKE 1 TABLET BY MOUTH EVERY DAY     Endocrinology:  Minerals - Iron Supplementation Failed - 03/21/2023 11:50 AM      Failed - Fe (serum) in normal range and within 360 days    Iron  Date Value Ref Range Status  10/20/2020 74 27 - 139 ug/dL Final   Iron Saturation  Date Value Ref Range Status  10/20/2020 20 15 - 55 % Final         Failed - Ferritin in normal range and within 360 days    Ferritin  Date Value Ref Range Status  10/20/2020 147 15 - 150 ng/mL Final         Passed - HGB in normal range and within 360 days    Hemoglobin  Date Value Ref Range Status  10/01/2022 13.0 11.7 - 15.5 g/dL Final  84/69/6295 28.4 11.1 - 15.9 g/dL Final         Passed - HCT in normal range and within 360 days    HCT  Date Value Ref Range Status  10/01/2022 38.3 35.0 - 45.0 % Final   Hematocrit  Date Value Ref Range Status  10/20/2020 43.4 34.0 - 46.6 % Final         Passed - RBC in normal range and within 360 days    RBC  Date Value Ref Range Status  10/01/2022 4.29 3.80 - 5.10 Million/uL Final         Passed - Valid encounter within last 12 months    Recent Outpatient Visits           5 months ago Type 2 diabetes mellitus with stage 3a chronic kidney disease, without long-term current use of insulin (HCC)   [place] [facility] [place], Salvadore Oxford, NP   9 months ago Toenail fungus   [place] [facility] [place], Salvadore Oxford, NP   1 year ago Encounter for general adult medical examination with abnormal findings   [place] [facility] [place], [place], NP   1 year ago Type 2 diabetes mellitus without complication, without long-term current use of insulin [facility])   [facility] [place], [place], NP   2  years ago Medicare annual wellness visit, subsequent   [place] [facility], Salvadore Oxford, NP       Future Appointments             In 1 week Sampson Si, Salvadore Oxford, NP [place] [facility]             carvedilol (COREG) 25 MG tablet [Pharmacy Med Name: CARVEDILOL 25 MG TABLET] 180 tablet 0    Sig: TAKE 1 TABLET BY MOUTH TWICE A DAY     Cardiovascular: Beta Blockers 3 Failed - 03/21/2023 11:50 AM      Failed - Cr in normal range and within 360 days    Creat  Date Value Ref Range Status  10/01/2022 1.02 (H) 0.60 - 0.95 mg/dL Final   Creatinine, Urine  Date Value Ref Range Status  10/01/2022 56 20 - 275 mg/dL Final         Passed - AST in normal range and within 360 days  AST  Date Value Ref Range Status  10/01/2022 22 10 - 35 U/L Final         Passed - ALT in normal range and within 360 days    ALT  Date Value Ref Range Status  10/01/2022 19 6 - 29 U/L Final         Passed - Last BP in normal range    BP Readings from Last 1 Encounters:  01/07/23 120/60         Passed - Last Heart Rate in normal range    Pulse Readings from Last 1 Encounters:  01/07/23 79         Passed - Valid encounter within last 6 months    Recent Outpatient Visits           5 months ago Type 2 diabetes mellitus with stage 3a chronic kidney disease, without long-term current use of insulin (HCC)   [place] [facility] [place], [place], NP   9 months ago Toenail fungus   [place] [facility] [place], [place] W, NP   1 year ago Encounter for general adult medical examination with abnormal findings   [place] [facility] [place], Salvadore Oxford, NP   1 year ago Type 2 diabetes mellitus without complication, without long-term current use of insulin (HCC)   [place] [facility] [place], [place], NP   2 years ago Medicare annual wellness visit, subsequent   [place] [facility] [place], Salvadore Oxford, NP       Future Appointments             In 1 week Sampson Si, Salvadore Oxford, NP [facility]             rosuvastatin (CRESTOR) 10 MG tablet [Pharmacy Med Name: ROSUVASTATIN CALCIUM 10 MG TAB] 90 tablet 1    Sig: TAKE 1 TABLET BY MOUTH EVERY DAY     Cardiovascular:  Antilipid - Statins 2 Failed - 03/21/2023 11:50 AM      Failed - Cr in normal range and within 360 days    Creat  Date Value Ref Range Status  10/01/2022 1.02 (H) 0.60 - 0.95 mg/dL Final   Creatinine, Urine  Date Value Ref Range Status  10/01/2022 56 20 - 275 mg/dL Final         Failed - Lipid Panel in normal range within the last 12 months    Cholesterol, Total  Date Value Ref Range Status  08/15/2015 274 (H) 100 - 199 mg/dL Final   Cholesterol  Date Value Ref Range Status  10/01/2022 155 <200 mg/dL Final   LDL Cholesterol (Calc)  Date Value Ref Range Status  10/01/2022 77 mg/dL (calc) Final    Comment:    Reference range: <100 . Desirable range <100 mg/dL for primary prevention;   <70 mg/dL for patients with CHD or diabetic patients  with > or = 2 CHD risk factors. Marland Kitchen LDL-C is now calculated using the Martin-Hopkins  calculation, which is a validated novel method providing  better accuracy than the Friedewald equation in the  estimation of LDL-C.  Horald Pollen et al. Lenox Ahr. 9562;130(86): 2061-2068  (http://education.[facility].com/faq/FAQ164)    Direct LDL  Date Value Ref Range Status  07/01/2019 87.0 mg/dL Final    Comment:    Optimal:  <100 mg/dLNear or Above Optimal:  100-129 mg/dLBorderline High:  130-159 mg/dLHigh:  160-189 mg/dLVery High:  >  190 mg/dL   HDL  Date Value Ref Range Status  10/01/2022 47 (L) > OR = 50 mg/dL Final  22/09/5425 38 (L) >39 mg/dL Final   Triglycerides  Date Value Ref Range Status  10/01/2022 225 (H) <150 mg/dL Final    Comment:    . If a non-fasting specimen was collected, consider repeat triglyceride  testing on a fasting specimen if clinically indicated.  Perry Mount et al. J. of Clin. Lipidol. 2015;9:129-169. Marland Kitchen          Passed - Patient is not pregnant      Passed - Valid encounter within last 12 months    Recent Outpatient Visits           5 months ago Type 2 diabetes mellitus with stage 3a chronic kidney disease, without long-term current use of insulin (HCC)   [place] [facility] [place], [place], NP   9 months ago Toenail fungus   [place] [facility] [place], [place], NP   1 year ago Encounter for general adult medical examination with abnormal findings   [place] [facility] [place], Salvadore Oxford, NP   1 year ago Type 2 diabetes mellitus without complication, without long-term current use of insulin (HCC)   [place] [facility] [place], [place], NP   2 years ago Medicare annual wellness visit, subsequent   [place] [facility] [place], Salvadore Oxford, NP       Future Appointments             In 1 week Sampson Si, Salvadore Oxford, NP [place] [facility], [facility]             fenofibrate (TRICOR) 145 MG tablet [Pharmacy Med Name: FENOFIBRATE 145 MG TABLET] 90 tablet 1    Sig: TAKE 1 TABLET BY MOUTH EVERY DAY     Cardiovascular:  Antilipid - Fibric Acid Derivatives Failed - 03/21/2023 11:50 AM      Failed - Cr in normal range and within 360 days    Creat  Date Value Ref Range Status  10/01/2022 1.02 (H) 0.60 - 0.95 mg/dL Final   Creatinine, Urine  Date Value Ref Range Status  10/01/2022 56 20 - 275 mg/dL Final         Failed - PLT in normal range and within 360 days    Platelets  Date Value Ref Range Status  10/01/2022 438 (H) 140 - 400 Thousand/uL Final  10/20/2020 438 150 - 450 x10E3/uL Final         Failed - Lipid Panel in normal range within the last 12 months    Cholesterol, Total  Date Value Ref Range Status  08/15/2015 274 (H) 100 - 199 mg/dL Final   Cholesterol   Date Value Ref Range Status  10/01/2022 155 <200 mg/dL Final   LDL Cholesterol (Calc)  Date Value Ref Range Status  10/01/2022 77 mg/dL (calc) Final    Comment:    Reference range: <100 . Desirable range <100 mg/dL for primary prevention;   <70 mg/dL for patients with CHD or diabetic patients  with > or = 2 CHD risk factors. Marland Kitchen LDL-C is now calculated using the Martin-Hopkins  calculation, which is a validated novel method providing  better accuracy than the Friedewald equation in the  estimation of LDL-C.  Horald Pollen et al. Lenox Ahr. 0623;762(83): 2061-2068  (http://education.[facility].com/faq/FAQ164)    Direct LDL  Date Value Ref Range Status  07/01/2019 87.0 mg/dL Final    Comment:    Optimal:  <100 mg/dLNear or Above Optimal:  100-129 mg/dLBorderline High:  130-159 mg/dLHigh:  160-189 mg/dLVery High:  >190 mg/dL   HDL  Date Value Ref Range Status  10/01/2022 47 (L) > OR = 50 mg/dL Final  78/29/5621 38 (L) >39 mg/dL Final   Triglycerides  Date Value Ref Range Status  10/01/2022 225 (H) <150 mg/dL Final    Comment:    . If a non-fasting specimen was collected, consider repeat triglyceride testing on a fasting specimen if clinically indicated.  Perry Mount et al. J. of Clin. Lipidol. 2015;9:129-169. Marland Kitchen          Passed - ALT in normal range and within 360 days    ALT  Date Value Ref Range Status  10/01/2022 19 6 - 29 U/L Final         Passed - AST in normal range and within 360 days    AST  Date Value Ref Range Status  10/01/2022 22 10 - 35 U/L Final         Passed - HGB in normal range and within 360 days    Hemoglobin  Date Value Ref Range Status  10/01/2022 13.0 11.7 - 15.5 g/dL Final  30/86/5784 69.6 11.1 - 15.9 g/dL Final         Passed - HCT in normal range and within 360 days    HCT  Date Value Ref Range Status  10/01/2022 38.3 35.0 - 45.0 % Final   Hematocrit  Date Value Ref Range Status  10/20/2020 43.4 34.0 - 46.6 % Final          Passed - WBC in normal range and within 360 days    WBC  Date Value Ref Range Status  10/01/2022 8.2 3.8 - 10.8 Thousand/uL Final         Passed - eGFR is 30 or above and within 360 days    GFR calc Af Amer  Date Value Ref Range Status  12/08/2019 57 (L) >60 mL/min Final   GFR calc non Af Amer  Date Value Ref Range Status  12/08/2019 49 (L) >60 mL/min Final   GFR  Date Value Ref Range Status  07/14/2020 49.90 (L) >60.00 mL/min Final    Comment:    Calculated using the CKD-EPI Creatinine Equation (2021)   eGFR  Date Value Ref Range Status  10/01/2022 55 (L) > OR = 60 mL/min/1.80m2 Final  07/10/2021 55 (L) >59 mL/min/1.73 Final         Passed - Valid encounter within last 12 months    Recent Outpatient Visits           5 months ago Type 2 diabetes mellitus with stage 3a chronic kidney disease, without long-term current use of insulin [facility])   [place] [facility] [place], Salvadore Oxford, NP   9 months ago Toenail fungus   [place] [facility] [place], Salvadore Oxford, NP   1 year ago Encounter for general adult medical examination with abnormal findings   [place] [facility] [place], [place], NP   1 year ago Type 2 diabetes mellitus without complication, without long-term current use of insulin [facility])   [place] [facility] [place], Salvadore Oxford, NP   2 years ago Medicare annual wellness visit, subsequent   [place] [facility] [place], Salvadore Oxford, NP       Future Appointments  In 1 week [place], Salvadore Oxford, NP [place] [facility], [facility]

## 2023-03-26 ENCOUNTER — Other Ambulatory Visit: Payer: Self-pay | Admitting: Internal Medicine

## 2023-03-26 DIAGNOSIS — E7800 Pure hypercholesterolemia, unspecified: Secondary | ICD-10-CM

## 2023-03-27 ENCOUNTER — Other Ambulatory Visit: Payer: Self-pay | Admitting: Internal Medicine

## 2023-03-27 DIAGNOSIS — I509 Heart failure, unspecified: Secondary | ICD-10-CM | POA: Diagnosis not present

## 2023-03-27 DIAGNOSIS — E559 Vitamin D deficiency, unspecified: Secondary | ICD-10-CM | POA: Diagnosis not present

## 2023-03-27 DIAGNOSIS — D509 Iron deficiency anemia, unspecified: Secondary | ICD-10-CM | POA: Diagnosis not present

## 2023-03-27 DIAGNOSIS — N1831 Chronic kidney disease, stage 3a: Secondary | ICD-10-CM | POA: Diagnosis not present

## 2023-03-27 DIAGNOSIS — I129 Hypertensive chronic kidney disease with stage 1 through stage 4 chronic kidney disease, or unspecified chronic kidney disease: Secondary | ICD-10-CM | POA: Diagnosis not present

## 2023-03-27 NOTE — Telephone Encounter (Signed)
Requested medication (s) are due for refill today - unsure dosing is correct  Requested medication (s) are on the active medication list -yes  Future visit scheduled -yes  Last refill: 10/18/22  Notes to clinic: discrepancy in dosing in chart- sent for review of correct dosing - 10mg  vs 20mg   Requested Prescriptions  Pending Prescriptions Disp Refills   rosuvastatin (CRESTOR) 10 MG tablet [Pharmacy Med Name: ROSUVASTATIN CALCIUM 10 MG TAB] 90 tablet 1    Sig: TAKE 1 TABLET BY MOUTH EVERY DAY     Cardiovascular:  Antilipid - Statins 2 Failed - 03/26/2023 10:02 AM      Failed - Cr in normal range and within 360 days    Creat  Date Value Ref Range Status  10/01/2022 1.02 (H) 0.60 - 0.95 mg/dL Final   Creatinine, Urine  Date Value Ref Range Status  10/01/2022 56 20 - 275 mg/dL Final         Failed - Lipid Panel in normal range within the last 12 months    Cholesterol, Total  Date Value Ref Range Status  08/15/2015 274 (H) 100 - 199 mg/dL Final   Cholesterol  Date Value Ref Range Status  10/01/2022 155 <200 mg/dL Final   LDL Cholesterol (Calc)  Date Value Ref Range Status  10/01/2022 77 mg/dL (calc) Final    Comment:    Reference range: <100 . Desirable range <100 mg/dL for primary prevention;   <70 mg/dL for patients with CHD or diabetic patients  with > or = 2 CHD risk factors. Marland Kitchen LDL-C is now calculated using the Martin-Hopkins  calculation, which is a validated novel method providing  better accuracy than the Friedewald equation in the  estimation of LDL-C.  Horald Pollen et al. Lenox Ahr. 9528;413(24): 2061-2068  (http://education.[facility].com/faq/FAQ164)    Direct LDL  Date Value Ref Range Status  07/01/2019 87.0 mg/dL Final    Comment:    Optimal:  <100 mg/dLNear or Above Optimal:  100-129 mg/dLBorderline High:  130-159 mg/dLHigh:  160-189 mg/dLVery High:  >190 mg/dL   HDL  Date Value Ref Range Status  10/01/2022 47 (L) > OR = 50 mg/dL Final  40/05/2724 38  (L) >39 mg/dL Final   Triglycerides  Date Value Ref Range Status  10/01/2022 225 (H) <150 mg/dL Final    Comment:    . If a non-fasting specimen was collected, consider repeat triglyceride testing on a fasting specimen if clinically indicated.  Perry Mount et al. J. of Clin. Lipidol. 2015;9:129-169. Marland Kitchen          Passed - Patient is not pregnant      Passed - Valid encounter within last 12 months    Recent Outpatient Visits           5 months ago Type 2 diabetes mellitus with stage 3a chronic kidney disease, without long-term current use of insulin [facility])   [place] [facility] [place], Salvadore Oxford, NP   10 months ago Toenail fungus   [place] [facility] [place], Salvadore Oxford, NP   1 year ago Encounter for general adult medical examination with abnormal findings   [place] [facility] [place], Salvadore Oxford, NP   1 year ago Type 2 diabetes mellitus without complication, without long-term current use of insulin [facility])   [place] [facility] [place], Salvadore Oxford, NP   2 years ago Medicare annual wellness visit, subsequent   [place] [facility] [place], [place],  NP       Future Appointments             In 2 weeks Baity, Salvadore Oxford, NP [place] [facility], [place]            Signed Prescriptions Disp Refills   fenofibrate (TRICOR) 145 MG tablet 90 tablet 1    Sig: TAKE 1 TABLET BY MOUTH EVERY DAY     Cardiovascular:  Antilipid - Fibric Acid Derivatives Failed - 03/26/2023 10:02 AM      Failed - Cr in normal range and within 360 days    Creat  Date Value Ref Range Status  10/01/2022 1.02 (H) 0.60 - 0.95 mg/dL Final   Creatinine, Urine  Date Value Ref Range Status  10/01/2022 56 20 - 275 mg/dL Final         Failed - PLT in normal range and within 360 days    Platelets  Date Value Ref Range Status  10/01/2022 438 (H) 140 - 400 Thousand/uL Final  10/20/2020 438 150 - 450 x10E3/uL  Final         Failed - Lipid Panel in normal range within the last 12 months    Cholesterol, Total  Date Value Ref Range Status  08/15/2015 274 (H) 100 - 199 mg/dL Final   Cholesterol  Date Value Ref Range Status  10/01/2022 155 <200 mg/dL Final   LDL Cholesterol (Calc)  Date Value Ref Range Status  10/01/2022 77 mg/dL (calc) Final    Comment:    Reference range: <100 . Desirable range <100 mg/dL for primary prevention;   <70 mg/dL for patients with CHD or diabetic patients  with > or = 2 CHD risk factors. Marland Kitchen LDL-C is now calculated using the Martin-Hopkins  calculation, which is a validated novel method providing  better accuracy than the Friedewald equation in the  estimation of LDL-C.  Horald Pollen et al. Lenox Ahr. 1610;960(45): 2061-2068  (http://education.[facility].com/faq/FAQ164)    Direct LDL  Date Value Ref Range Status  07/01/2019 87.0 mg/dL Final    Comment:    Optimal:  <100 mg/dLNear or Above Optimal:  100-129 mg/dLBorderline High:  130-159 mg/dLHigh:  160-189 mg/dLVery High:  >190 mg/dL   HDL  Date Value Ref Range Status  10/01/2022 47 (L) > OR = 50 mg/dL Final  40/98/1191 38 (L) >39 mg/dL Final   Triglycerides  Date Value Ref Range Status  10/01/2022 225 (H) <150 mg/dL Final    Comment:    . If a non-fasting specimen was collected, consider repeat triglyceride testing on a fasting specimen if clinically indicated.  Perry Mount et al. J. of Clin. Lipidol. 2015;9:129-169. Marland Kitchen          Passed - ALT in normal range and within 360 days    ALT  Date Value Ref Range Status  10/01/2022 19 6 - 29 U/L Final         Passed - AST in normal range and within 360 days    AST  Date Value Ref Range Status  10/01/2022 22 10 - 35 U/L Final         Passed - HGB in normal range and within 360 days    Hemoglobin  Date Value Ref Range Status  10/01/2022 13.0 11.7 - 15.5 g/dL Final  47/82/9562 13.0 11.1 - 15.9 g/dL Final         Passed - HCT in normal range  and within 360 days    HCT  Date Value Ref  Range Status  10/01/2022 38.3 35.0 - 45.0 % Final   Hematocrit  Date Value Ref Range Status  10/20/2020 43.4 34.0 - 46.6 % Final         Passed - WBC in normal range and within 360 days    WBC  Date Value Ref Range Status  10/01/2022 8.2 3.8 - 10.8 Thousand/uL Final         Passed - eGFR is 30 or above and within 360 days    GFR calc Af Amer  Date Value Ref Range Status  12/08/2019 57 (L) >60 mL/min Final   GFR calc non Af Amer  Date Value Ref Range Status  12/08/2019 49 (L) >60 mL/min Final   GFR  Date Value Ref Range Status  07/14/2020 49.90 (L) >60.00 mL/min Final    Comment:    Calculated using the CKD-EPI Creatinine Equation (2021)   eGFR  Date Value Ref Range Status  10/01/2022 55 (L) > OR = 60 mL/min/1.19m2 Final  07/10/2021 55 (L) >59 mL/min/1.73 Final         Passed - Valid encounter within last 12 months    Recent Outpatient Visits           5 months ago Type 2 diabetes mellitus with stage 3a chronic kidney disease, without long-term current use of insulin (HCC)   [place] [facility] [place], Salvadore Oxford, NP   10 months ago Toenail fungus   [place] [facility] [place], [place], NP   1 year ago Encounter for general adult medical examination with abnormal findings   [place] [facility], Salvadore Oxford, NP   1 year ago Type 2 diabetes mellitus without complication, without long-term current use of insulin (HCC)   [place] [facility] [place], [place], NP   2 years ago Medicare annual wellness visit, subsequent   [place] [facility] [place], Salvadore Oxford, NP       Future Appointments             In 2 weeks Sampson Si, Salvadore Oxford, NP [facility], [facility]               Requested Prescriptions  Pending Prescriptions Disp Refills   rosuvastatin (CRESTOR) 10 MG tablet [Pharmacy Med Name: ROSUVASTATIN  CALCIUM 10 MG TAB] 90 tablet 1    Sig: TAKE 1 TABLET BY MOUTH EVERY DAY     Cardiovascular:  Antilipid - Statins 2 Failed - 03/26/2023 10:02 AM      Failed - Cr in normal range and within 360 days    Creat  Date Value Ref Range Status  10/01/2022 1.02 (H) 0.60 - 0.95 mg/dL Final   Creatinine, Urine  Date Value Ref Range Status  10/01/2022 56 20 - 275 mg/dL Final         Failed - Lipid Panel in normal range within the last 12 months    Cholesterol, Total  Date Value Ref Range Status  08/15/2015 274 (H) 100 - 199 mg/dL Final   Cholesterol  Date Value Ref Range Status  10/01/2022 155 <200 mg/dL Final   LDL Cholesterol (Calc)  Date Value Ref Range Status  10/01/2022 77 mg/dL (calc) Final    Comment:    Reference range: <100 . Desirable range <100 mg/dL for primary prevention;   <70 mg/dL for patients with CHD or diabetic patients  with > or = 2 CHD risk  factors. Marland Kitchen LDL-C is now calculated using the Martin-Hopkins  calculation, which is a validated novel method providing  better accuracy than the Friedewald equation in the  estimation of LDL-C.  Horald Pollen et al. Lenox Ahr. 7846;962(95): 2061-2068  (http://education.[facility].com/faq/FAQ164)    Direct LDL  Date Value Ref Range Status  07/01/2019 87.0 mg/dL Final    Comment:    Optimal:  <100 mg/dLNear or Above Optimal:  100-129 mg/dLBorderline High:  130-159 mg/dLHigh:  160-189 mg/dLVery High:  >190 mg/dL   HDL  Date Value Ref Range Status  10/01/2022 47 (L) > OR = 50 mg/dL Final  28/41/3244 38 (L) >39 mg/dL Final   Triglycerides  Date Value Ref Range Status  10/01/2022 225 (H) <150 mg/dL Final    Comment:    . If a non-fasting specimen was collected, consider repeat triglyceride testing on a fasting specimen if clinically indicated.  Perry Mount et al. J. of Clin. Lipidol. 2015;9:129-169. Marland Kitchen          Passed - Patient is not pregnant      Passed - Valid encounter within last 12 months    Recent Outpatient  Visits           5 months ago Type 2 diabetes mellitus with stage 3a chronic kidney disease, without long-term current use of insulin (HCC)   [place] [facility] [place], [place], NP   10 months ago Toenail fungus   [place] [facility] [place], [place], NP   1 year ago Encounter for general adult medical examination with abnormal findings   [place] [facility] [place], Salvadore Oxford, NP   1 year ago Type 2 diabetes mellitus without complication, without long-term current use of insulin (HCC)   [place] [facility] [place], [place], NP   2 years ago Medicare annual wellness visit, subsequent   [place] [facility] [place], Salvadore Oxford, NP       Future Appointments             In 2 weeks Sampson Si, Salvadore Oxford, NP  [facility], [place]            Signed Prescriptions Disp Refills   fenofibrate (TRICOR) 145 MG tablet 90 tablet 1    Sig: TAKE 1 TABLET BY MOUTH EVERY DAY     Cardiovascular:  Antilipid - Fibric Acid Derivatives Failed - 03/26/2023 10:02 AM      Failed - Cr in normal range and within 360 days    Creat  Date Value Ref Range Status  10/01/2022 1.02 (H) 0.60 - 0.95 mg/dL Final   Creatinine, Urine  Date Value Ref Range Status  10/01/2022 56 20 - 275 mg/dL Final         Failed - PLT in normal range and within 360 days    Platelets  Date Value Ref Range Status  10/01/2022 438 (H) 140 - 400 Thousand/uL Final  10/20/2020 438 150 - 450 x10E3/uL Final         Failed - Lipid Panel in normal range within the last 12 months    Cholesterol, Total  Date Value Ref Range Status  08/15/2015 274 (H) 100 - 199 mg/dL Final   Cholesterol  Date Value Ref Range Status  10/01/2022 155 <200 mg/dL Final   LDL Cholesterol (Calc)  Date Value Ref Range Status  10/01/2022 77 mg/dL (calc) Final    Comment:    Reference range: <  100 . Desirable range <100 mg/dL for primary  prevention;   <70 mg/dL for patients with CHD or diabetic patients  with > or = 2 CHD risk factors. Marland Kitchen LDL-C is now calculated using the Martin-Hopkins  calculation, which is a validated novel method providing  better accuracy than the Friedewald equation in the  estimation of LDL-C.  Horald Pollen et al. Lenox Ahr. 1610;960(45): 2061-2068  (http://education.[facility].com/faq/FAQ164)    Direct LDL  Date Value Ref Range Status  07/01/2019 87.0 mg/dL Final    Comment:    Optimal:  <100 mg/dLNear or Above Optimal:  100-129 mg/dLBorderline High:  130-159 mg/dLHigh:  160-189 mg/dLVery High:  >190 mg/dL   HDL  Date Value Ref Range Status  10/01/2022 47 (L) > OR = 50 mg/dL Final  40/98/1191 38 (L) >39 mg/dL Final   Triglycerides  Date Value Ref Range Status  10/01/2022 225 (H) <150 mg/dL Final    Comment:    . If a non-fasting specimen was collected, consider repeat triglyceride testing on a fasting specimen if clinically indicated.  Perry Mount et al. J. of Clin. Lipidol. 2015;9:129-169. Marland Kitchen          Passed - ALT in normal range and within 360 days    ALT  Date Value Ref Range Status  10/01/2022 19 6 - 29 U/L Final         Passed - AST in normal range and within 360 days    AST  Date Value Ref Range Status  10/01/2022 22 10 - 35 U/L Final         Passed - HGB in normal range and within 360 days    Hemoglobin  Date Value Ref Range Status  10/01/2022 13.0 11.7 - 15.5 g/dL Final  47/82/9562 13.0 11.1 - 15.9 g/dL Final         Passed - HCT in normal range and within 360 days    HCT  Date Value Ref Range Status  10/01/2022 38.3 35.0 - 45.0 % Final   Hematocrit  Date Value Ref Range Status  10/20/2020 43.4 34.0 - 46.6 % Final         Passed - WBC in normal range and within 360 days    WBC  Date Value Ref Range Status  10/01/2022 8.2 3.8 - 10.8 Thousand/uL Final         Passed - eGFR is 30 or above and within 360 days    GFR calc Af Amer  Date Value Ref Range  Status  12/08/2019 57 (L) >60 mL/min Final   GFR calc non Af Amer  Date Value Ref Range Status  12/08/2019 49 (L) >60 mL/min Final   GFR  Date Value Ref Range Status  07/14/2020 49.90 (L) >60.00 mL/min Final    Comment:    Calculated using the CKD-EPI Creatinine Equation (2021)   eGFR  Date Value Ref Range Status  10/01/2022 55 (L) > OR = 60 mL/min/1.48m2 Final  07/10/2021 55 (L) >59 mL/min/1.73 Final         Passed - Valid encounter within last 12 months    Recent Outpatient Visits           5 months ago Type 2 diabetes mellitus with stage 3a chronic kidney disease, without long-term current use of insulin [facility]   [facility] [place], Salvadore Oxford, NP   10 months ago Toenail fungus   [place] [facility] [place], Salvadore Oxford, NP   1 year  ago Encounter for general adult medical examination with abnormal findings   [place] [facility] [place], [place], NP   1 year ago Type 2 diabetes mellitus without complication, without long-term current use of insulin [facility])   [place] [facility] [place], Salvadore Oxford, NP   2 years ago Medicare annual wellness visit, subsequent   [place] [facility] [place], Salvadore Oxford, NP       Future Appointments             In 2 weeks Sampson Si, Salvadore Oxford, NP [place] [facility], [facility]

## 2023-03-27 NOTE — Telephone Encounter (Signed)
Medication Refill - Medication:  fenofibrate (TRICOR) 145 MG tablet one more but not sure the name of medication   Has the patient contacted their pharmacy? Yes.  Advised to contact pcp  Preferred Pharmacy (with phone number or street name):  [phone] [place], [place] - [address] Phone: [phone]  Fax: [phone]      Has the patient been seen for an appointment in the last year OR does the patient have an upcoming appointment? Yes.  F/u on 04/10/2023

## 2023-03-27 NOTE — Telephone Encounter (Signed)
Requested Prescriptions  Pending Prescriptions Disp Refills   rosuvastatin (CRESTOR) 10 MG tablet [Pharmacy Med Name: ROSUVASTATIN CALCIUM 10 MG TAB] 90 tablet 1    Sig: TAKE 1 TABLET BY MOUTH EVERY DAY     Cardiovascular:  Antilipid - Statins 2 Failed - 03/26/2023 10:02 AM      Failed - Cr in normal range and within 360 days    Creat  Date Value Ref Range Status  10/01/2022 1.02 (H) 0.60 - 0.95 mg/dL Final   Creatinine, Urine  Date Value Ref Range Status  10/01/2022 56 20 - 275 mg/dL Final         Failed - Lipid Panel in normal range within the last 12 months    Cholesterol, Total  Date Value Ref Range Status  08/15/2015 274 (H) 100 - 199 mg/dL Final   Cholesterol  Date Value Ref Range Status  10/01/2022 155 <200 mg/dL Final   LDL Cholesterol (Calc)  Date Value Ref Range Status  10/01/2022 77 mg/dL (calc) Final    Comment:    Reference range: <100 . Desirable range <100 mg/dL for primary prevention;   <70 mg/dL for patients with CHD or diabetic patients  with > or = 2 CHD risk factors. Marland Kitchen LDL-C is now calculated using the Martin-Hopkins  calculation, which is a validated novel method providing  better accuracy than the Friedewald equation in the  estimation of LDL-C.  Horald Pollen et al. Lenox Ahr. 4332;951(88): 2061-2068  (http://education.[facility].com/faq/FAQ164)    Direct LDL  Date Value Ref Range Status  07/01/2019 87.0 mg/dL Final    Comment:    Optimal:  <100 mg/dLNear or Above Optimal:  100-129 mg/dLBorderline High:  130-159 mg/dLHigh:  160-189 mg/dLVery High:  >190 mg/dL   HDL  Date Value Ref Range Status  10/01/2022 47 (L) > OR = 50 mg/dL Final  41/66/0630 38 (L) >39 mg/dL Final   Triglycerides  Date Value Ref Range Status  10/01/2022 225 (H) <150 mg/dL Final    Comment:    . If a non-fasting specimen was collected, consider repeat triglyceride testing on a fasting specimen if clinically indicated.  Perry Mount et al. J. of Clin. Lipidol.  2015;9:129-169. Marland Kitchen          Passed - Patient is not pregnant      Passed - Valid encounter within last 12 months    Recent Outpatient Visits           5 months ago Type 2 diabetes mellitus with stage 3a chronic kidney disease, without long-term current use of insulin [facility])   [facility] [place], Salvadore Oxford, NP   10 months ago Toenail fungus   [place] [facility] [place], Salvadore Oxford, NP   1 year ago Encounter for general adult medical examination with abnormal findings   [place] [facility] [place], [place], NP   1 year ago Type 2 diabetes mellitus without complication, without long-term current use of insulin [facility])   [facility] [place], Salvadore Oxford, NP   2 years ago Medicare annual wellness visit, subsequent   [place] [facility] [place], Salvadore Oxford, NP       Future Appointments             In 2 weeks Sampson Si, Salvadore Oxford, NP [facility]             fenofibrate (TRICOR) 145 MG tablet [employer]  Med Name: FENOFIBRATE 145 MG TABLET] 90 tablet 1    Sig: TAKE 1 TABLET BY MOUTH EVERY DAY     Cardiovascular:  Antilipid - Fibric Acid Derivatives Failed - 03/26/2023 10:02 AM      Failed - Cr in normal range and within 360 days    Creat  Date Value Ref Range Status  10/01/2022 1.02 (H) 0.60 - 0.95 mg/dL Final   Creatinine, Urine  Date Value Ref Range Status  10/01/2022 56 20 - 275 mg/dL Final         Failed - PLT in normal range and within 360 days    Platelets  Date Value Ref Range Status  10/01/2022 438 (H) 140 - 400 Thousand/uL Final  10/20/2020 438 150 - 450 x10E3/uL Final         Failed - Lipid Panel in normal range within the last 12 months    Cholesterol, Total  Date Value Ref Range Status  08/15/2015 274 (H) 100 - 199 mg/dL Final   Cholesterol  Date Value Ref Range Status  10/01/2022 155 <200 mg/dL Final   LDL Cholesterol (Calc)   Date Value Ref Range Status  10/01/2022 77 mg/dL (calc) Final    Comment:    Reference range: <100 . Desirable range <100 mg/dL for primary prevention;   <70 mg/dL for patients with CHD or diabetic patients  with > or = 2 CHD risk factors. Marland Kitchen LDL-C is now calculated using the Martin-Hopkins  calculation, which is a validated novel method providing  better accuracy than the Friedewald equation in the  estimation of LDL-C.  Horald Pollen et al. Lenox Ahr. 1610;960(45): 2061-2068  (http://education.[facility].com/faq/FAQ164)    Direct LDL  Date Value Ref Range Status  07/01/2019 87.0 mg/dL Final    Comment:    Optimal:  <100 mg/dLNear or Above Optimal:  100-129 mg/dLBorderline High:  130-159 mg/dLHigh:  160-189 mg/dLVery High:  >190 mg/dL   HDL  Date Value Ref Range Status  10/01/2022 47 (L) > OR = 50 mg/dL Final  40/98/1191 38 (L) >39 mg/dL Final   Triglycerides  Date Value Ref Range Status  10/01/2022 225 (H) <150 mg/dL Final    Comment:    . If a non-fasting specimen was collected, consider repeat triglyceride testing on a fasting specimen if clinically indicated.  Perry Mount et al. J. of Clin. Lipidol. 2015;9:129-169. Marland Kitchen          Passed - ALT in normal range and within 360 days    ALT  Date Value Ref Range Status  10/01/2022 19 6 - 29 U/L Final         Passed - AST in normal range and within 360 days    AST  Date Value Ref Range Status  10/01/2022 22 10 - 35 U/L Final         Passed - HGB in normal range and within 360 days    Hemoglobin  Date Value Ref Range Status  10/01/2022 13.0 11.7 - 15.5 g/dL Final  47/82/9562 13.0 11.1 - 15.9 g/dL Final         Passed - HCT in normal range and within 360 days    HCT  Date Value Ref Range Status  10/01/2022 38.3 35.0 - 45.0 % Final   Hematocrit  Date Value Ref Range Status  10/20/2020 43.4 34.0 - 46.6 % Final         Passed - WBC in normal range and within 360 days    WBC  Date Value  Ref Range Status   10/01/2022 8.2 3.8 - 10.8 Thousand/uL Final         Passed - eGFR is 30 or above and within 360 days    GFR calc Af Amer  Date Value Ref Range Status  12/08/2019 57 (L) >60 mL/min Final   GFR calc non Af Amer  Date Value Ref Range Status  12/08/2019 49 (L) >60 mL/min Final   GFR  Date Value Ref Range Status  07/14/2020 49.90 (L) >60.00 mL/min Final    Comment:    Calculated using the CKD-EPI Creatinine Equation (2021)   eGFR  Date Value Ref Range Status  10/01/2022 55 (L) > OR = 60 mL/min/1.69m2 Final  07/10/2021 55 (L) >59 mL/min/1.73 Final         Passed - Valid encounter within last 12 months    Recent Outpatient Visits           5 months ago Type 2 diabetes mellitus with stage 3a chronic kidney disease, without long-term current use of insulin [facility])   [place] [facility] [place], Salvadore Oxford, NP   10 months ago Toenail fungus   [facility] [place], Salvadore Oxford, NP   1 year ago Encounter for general adult medical examination with abnormal findings   [place] [facility] [place], [place] W, NP   1 year ago Type 2 diabetes mellitus without complication, without long-term current use of insulin [facility])   [place] [facility] [place], Salvadore Oxford, NP   2 years ago Medicare annual wellness visit, subsequent   [place] [facility] [place], Salvadore Oxford, NP       Future Appointments             In 2 weeks Sampson Si, Salvadore Oxford, NP [place] [facility], [facility]

## 2023-03-28 NOTE — Telephone Encounter (Signed)
Unable to refill per protocol, Rx request was refilled 03/27/23 for 90 days,duplicate request.E-Prescribing Status: Receipt confirmed by pharmacy (03/27/2023  2:03 PM EDT)   Requested Prescriptions  Pending Prescriptions Disp Refills   fenofibrate (TRICOR) 145 MG tablet 90 tablet 1    Sig: Take 1 tablet (145 mg total) by mouth daily.     Cardiovascular:  Antilipid - Fibric Acid Derivatives Failed - 03/27/2023  2:32 PM      Failed - Cr in normal range and within 360 days    Creat  Date Value Ref Range Status  10/01/2022 1.02 (H) 0.60 - 0.95 mg/dL Final   Creatinine, Urine  Date Value Ref Range Status  10/01/2022 56 20 - 275 mg/dL Final         Failed - PLT in normal range and within 360 days    Platelets  Date Value Ref Range Status  10/01/2022 438 (H) 140 - 400 Thousand/uL Final  10/20/2020 438 150 - 450 x10E3/uL Final         Failed - Lipid Panel in normal range within the last 12 months    Cholesterol, Total  Date Value Ref Range Status  08/15/2015 274 (H) 100 - 199 mg/dL Final   Cholesterol  Date Value Ref Range Status  10/01/2022 155 <200 mg/dL Final   LDL Cholesterol (Calc)  Date Value Ref Range Status  10/01/2022 77 mg/dL (calc) Final    Comment:    Reference range: <100 . Desirable range <100 mg/dL for primary prevention;   <70 mg/dL for patients with CHD or diabetic patients  with > or = 2 CHD risk factors. Marland Kitchen LDL-C is now calculated using the Martin-Hopkins  calculation, which is a validated novel method providing  better accuracy than the Friedewald equation in the  estimation of LDL-C.  Horald Pollen et al. Lenox Ahr. 1308;657(84): 2061-2068  (http://education.[facility].com/faq/FAQ164)    Direct LDL  Date Value Ref Range Status  07/01/2019 87.0 mg/dL Final    Comment:    Optimal:  <100 mg/dLNear or Above Optimal:  100-129 mg/dLBorderline High:  130-159 mg/dLHigh:  160-189 mg/dLVery High:  >190 mg/dL   HDL  Date Value Ref Range Status  10/01/2022 47  (L) > OR = 50 mg/dL Final  69/62/9528 38 (L) >39 mg/dL Final   Triglycerides  Date Value Ref Range Status  10/01/2022 225 (H) <150 mg/dL Final    Comment:    . If a non-fasting specimen was collected, consider repeat triglyceride testing on a fasting specimen if clinically indicated.  Perry Mount et al. J. of Clin. Lipidol. 2015;9:129-169. Marland Kitchen          Passed - ALT in normal range and within 360 days    ALT  Date Value Ref Range Status  10/01/2022 19 6 - 29 U/L Final         Passed - AST in normal range and within 360 days    AST  Date Value Ref Range Status  10/01/2022 22 10 - 35 U/L Final         Passed - HGB in normal range and within 360 days    Hemoglobin  Date Value Ref Range Status  10/01/2022 13.0 11.7 - 15.5 g/dL Final  41/32/4401 02.7 11.1 - 15.9 g/dL Final         Passed - HCT in normal range and within 360 days    HCT  Date Value Ref Range Status  10/01/2022 38.3 35.0 - 45.0 % Final   Hematocrit  Date  Value Ref Range Status  10/20/2020 43.4 34.0 - 46.6 % Final         Passed - WBC in normal range and within 360 days    WBC  Date Value Ref Range Status  10/01/2022 8.2 3.8 - 10.8 Thousand/uL Final         Passed - eGFR is 30 or above and within 360 days    GFR calc Af Amer  Date Value Ref Range Status  12/08/2019 57 (L) >60 mL/min Final   GFR calc non Af Amer  Date Value Ref Range Status  12/08/2019 49 (L) >60 mL/min Final   GFR  Date Value Ref Range Status  07/14/2020 49.90 (L) >60.00 mL/min Final    Comment:    Calculated using the CKD-EPI Creatinine Equation (2021)   eGFR  Date Value Ref Range Status  10/01/2022 55 (L) > OR = 60 mL/min/1.67m2 Final  07/10/2021 55 (L) >59 mL/min/1.73 Final         Passed - Valid encounter within last 12 months    Recent Outpatient Visits           5 months ago Type 2 diabetes mellitus with stage 3a chronic kidney disease, without long-term current use of insulin [facility])   [place] [facility] [place], Salvadore Oxford, NP   10 months ago Toenail fungus   [place] [facility] [place], Salvadore Oxford, NP   1 year ago Encounter for general adult medical examination with abnormal findings   [place] [facility] [place], [place] W, NP   1 year ago Type 2 diabetes mellitus without complication, without long-term current use of insulin [facility])   [place] [facility] [place], Salvadore Oxford, NP   2 years ago Medicare annual wellness visit, subsequent   [place] [facility] [place], Salvadore Oxford, NP       Future Appointments             In 1 week Sam[place] Si, Salvadore Oxford, NP  [facility], [facility]

## 2023-04-01 ENCOUNTER — Ambulatory Visit: Payer: Medicare HMO | Admitting: Internal Medicine

## 2023-04-02 ENCOUNTER — Ambulatory Visit
Admission: RE | Admit: 2023-04-02 | Discharge: 2023-04-02 | Disposition: A | Discharge status: Home / Self Care | Payer: Medicare HMO | Source: Ambulatory Visit | Attending: Radiation Oncology | Admitting: Radiation Oncology

## 2023-04-02 ENCOUNTER — Encounter: Payer: Self-pay | Admitting: Radiation Oncology

## 2023-04-02 VITALS — BP 123/74 | HR 75 | Temp 96.0°F | Resp 16 | Ht 63.0 in | Wt 154.8 lb

## 2023-04-02 DIAGNOSIS — Z170 Estrogen receptor positive status [ER+]: Secondary | ICD-10-CM | POA: Insufficient documentation

## 2023-04-02 DIAGNOSIS — Z79811 Long term (current) use of aromatase inhibitors: Secondary | ICD-10-CM | POA: Insufficient documentation

## 2023-04-02 DIAGNOSIS — C50511 Malignant neoplasm of lower-outer quadrant of right female breast: Secondary | ICD-10-CM | POA: Insufficient documentation

## 2023-04-02 DIAGNOSIS — Z923 Personal history of irradiation: Secondary | ICD-10-CM | POA: Diagnosis not present

## 2023-04-02 NOTE — Progress Notes (Signed)
Radiation Oncology Follow up Note  Name: Janice Liu   Date:   04/02/2023 MRN:  [id number] DOB: Sep 29, 1940    This 82 y.o. 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  REFERRING PROVIDER: Lorre Munroe, NP  HPI: Patient is a 82 year old female now out over 3 years having completed whole breast radiation to her right breast for stage IIa invasive mammary carcinoma.  Seen today in follow-up she is doing well.  She specifically denies breast tenderness cough or bone pain..  Mammograms back in May which I have reviewed were BI-RADS 1 negative.  She is currently on Femara tolerating it well without side effect.  COMPLICATIONS OF TREATMENT: none  FOLLOW UP COMPLIANCE: keeps appointments   PHYSICAL EXAM:  BP 123/74   Pulse 75   Temp (!) 96 F (35.6 C)   Resp 16   Ht 5\' 3"  (1.6 m)   Wt 154 lb 12.8 oz (70.2 kg)   BMI 27.42 kg/m  Lungs are clear to A&P cardiac examination essentially unremarkable with regular rate and rhythm. No dominant mass or nodularity is noted in either breast in 2 positions examined. Incision is well-healed. No axillary or supraclavicular adenopathy is appreciated. Cosmetic result is excellent.  Well-developed well-nourished patient in NAD. HEENT reveals PERLA, EOMI, discs not visualized.  Oral cavity is clear. No oral mucosal lesions are identified. Neck is clear without evidence of cervical or supraclavicular adenopathy. Lungs are clear to A&P. Cardiac examination is essentially unremarkable with regular rate and rhythm without murmur rub or thrill. Abdomen is benign with no organomegaly or masses noted. Motor sensory and DTR levels are equal and symmetric in the upper and lower extremities. Cranial nerves II through XII are grossly intact. Proprioception is intact. No peripheral adenopathy or edema is identified. No motor or sensory levels are noted.  Crude visual fields are within normal range.  RADIOLOGY RESULTS: Mammograms reviewed compatible with above-stated findings  PLAN: Time patient is now at over 3 years.  I am turning follow-up care over to medical oncology.  I would be happy to reevaluate the patient anytime should that be indicated.  Patient knows to call with any concerns.  I would like to take this opportunity to thank you for allowing me to participate in the care of your patient.Carmina Miller, MD

## 2023-04-10 ENCOUNTER — Encounter: Payer: Self-pay | Admitting: Internal Medicine

## 2023-04-10 ENCOUNTER — Other Ambulatory Visit: Payer: Self-pay | Admitting: Internal Medicine

## 2023-04-10 ENCOUNTER — Ambulatory Visit (INDEPENDENT_AMBULATORY_CARE_PROVIDER_SITE_OTHER): Payer: Medicare HMO | Admitting: Internal Medicine

## 2023-04-10 VITALS — BP 134/82 | HR 77 | Temp 96.8°F | Wt 154.0 lb

## 2023-04-10 DIAGNOSIS — E663 Overweight: Secondary | ICD-10-CM | POA: Diagnosis not present

## 2023-04-10 DIAGNOSIS — Z6827 Body mass index (BMI) 27.0-27.9, adult: Secondary | ICD-10-CM

## 2023-04-10 DIAGNOSIS — Z0001 Encounter for general adult medical examination with abnormal findings: Secondary | ICD-10-CM | POA: Diagnosis not present

## 2023-04-10 DIAGNOSIS — Z23 Encounter for immunization: Secondary | ICD-10-CM | POA: Diagnosis not present

## 2023-04-10 DIAGNOSIS — E782 Mixed hyperlipidemia: Secondary | ICD-10-CM | POA: Diagnosis not present

## 2023-04-10 DIAGNOSIS — N1831 Chronic kidney disease, stage 3a: Secondary | ICD-10-CM

## 2023-04-10 DIAGNOSIS — E1122 Type 2 diabetes mellitus with diabetic chronic kidney disease: Secondary | ICD-10-CM | POA: Diagnosis not present

## 2023-04-10 MED ORDER — GLUCOSE BLOOD VI STRP: ORAL_STRIP | 12 refills | Status: DC

## 2023-04-10 NOTE — Assessment & Plan Note (Signed)
Encouraged diet and exercise for weight loss ?

## 2023-04-10 NOTE — Addendum Note (Signed)
Addended by: Lorre Munroe on: 04/10/2023 10:51 AM   Modules accepted: Orders

## 2023-04-10 NOTE — Patient Instructions (Signed)
Health Maintenance for Postmenopausal Women Menopause is a normal process in which your ability to get pregnant comes to an end. This process happens slowly over many months or years, usually between the ages of 48 and 55. Menopause is complete when you have missed your menstrual period for 12 months. It is important to talk with your health care provider about some of the most common conditions that affect women after menopause (postmenopausal women). These include heart disease, cancer, and bone loss (osteoporosis). Adopting a healthy lifestyle and getting preventive care can help to promote your health and wellness. The actions you take can also lower your chances of developing some of these common conditions. What are the signs and symptoms of menopause? During menopause, you may have the following symptoms: Hot flashes. These can be moderate or severe. Night sweats. Decrease in sex drive. Mood swings. Headaches. Tiredness (fatigue). Irritability. Memory problems. Problems falling asleep or staying asleep. Talk with your health care provider about treatment options for your symptoms. Do I need hormone replacement therapy? Hormone replacement therapy is effective in treating symptoms that are caused by menopause, such as hot flashes and night sweats. Hormone replacement carries certain risks, especially as you become older. If you are thinking about using estrogen or estrogen with progestin, discuss the benefits and risks with your health care provider. How can I reduce my risk for heart disease and stroke? The risk of heart disease, heart attack, and stroke increases as you age. One of the causes may be a change in the body's hormones during menopause. This can affect how your body uses dietary fats, triglycerides, and cholesterol. Heart attack and stroke are medical emergencies. There are many things that you can do to help prevent heart disease and stroke. Watch your blood pressure High  blood pressure causes heart disease and increases the risk of stroke. This is more likely to develop in people who have high blood pressure readings or are overweight. Have your blood pressure checked: Every 3-5 years if you are 18-39 years of age. Every year if you are 40 years old or older. Eat a healthy diet  Eat a diet that includes plenty of vegetables, fruits, low-fat dairy products, and lean protein. Do not eat a lot of foods that are high in solid fats, added sugars, or sodium. Get regular exercise Get regular exercise. This is one of the most important things you can do for your health. Most adults should: Try to exercise for at least 150 minutes each week. The exercise should increase your heart rate and make you sweat (moderate-intensity exercise). Try to do strengthening exercises at least twice each week. Do these in addition to the moderate-intensity exercise. Spend less time sitting. Even light physical activity can be beneficial. Other tips Work with your health care provider to achieve or maintain a healthy weight. Do not use any products that contain nicotine or tobacco. These products include cigarettes, chewing tobacco, and vaping devices, such as e-cigarettes. If you need help quitting, ask your health care provider. Know your numbers. Ask your health care provider to check your cholesterol and your blood sugar (glucose). Continue to have your blood tested as directed by your health care provider. Do I need screening for cancer? Depending on your health history and family history, you may need to have cancer screenings at different stages of your life. This may include screening for: Breast cancer. Cervical cancer. Lung cancer. Colorectal cancer. What is my risk for osteoporosis? After menopause, you may be   at increased risk for osteoporosis. Osteoporosis is a condition in which bone destruction happens more quickly than new bone creation. To help prevent osteoporosis or  the bone fractures that can happen because of osteoporosis, you may take the following actions: If you are 19-50 years old, get at least 1,000 mg of calcium and at least 600 international units (IU) of vitamin D per day. If you are older than age 50 but younger than age 70, get at least 1,200 mg of calcium and at least 600 international units (IU) of vitamin D per day. If you are older than age 70, get at least 1,200 mg of calcium and at least 800 international units (IU) of vitamin D per day. Smoking and drinking excessive alcohol increase the risk of osteoporosis. Eat foods that are rich in calcium and vitamin D, and do weight-bearing exercises several times each week as directed by your health care provider. How does menopause affect my mental health? Depression may occur at any age, but it is more common as you become older. Common symptoms of depression include: Feeling depressed. Changes in sleep patterns. Changes in appetite or eating patterns. Feeling an overall lack of motivation or enjoyment of activities that you previously enjoyed. Frequent crying spells. Talk with your health care provider if you think that you are experiencing any of these symptoms. General instructions See your health care provider for regular wellness exams and vaccines. This may include: Scheduling regular health, dental, and eye exams. Getting and maintaining your vaccines. These include: Influenza vaccine. Get this vaccine each year before the flu season begins. Pneumonia vaccine. Shingles vaccine. Tetanus, diphtheria, and pertussis (Tdap) booster vaccine. Your health care provider may also recommend other immunizations. Tell your health care provider if you have ever been abused or do not feel safe at home. Summary Menopause is a normal process in which your ability to get pregnant comes to an end. This condition causes hot flashes, night sweats, decreased interest in sex, mood swings, headaches, or lack  of sleep. Treatment for this condition may include hormone replacement therapy. Take actions to keep yourself healthy, including exercising regularly, eating a healthy diet, watching your weight, and checking your blood pressure and blood sugar levels. Get screened for cancer and depression. Make sure that you are up to date with all your vaccines. This information is not intended to replace advice given to you by your health care provider. Make sure you discuss any questions you have with your health care provider. Document Revised: 12/18/2020 Document Reviewed: 12/18/2020 Elsevier Patient Education  2024 Elsevier Inc.  

## 2023-04-10 NOTE — Progress Notes (Addendum)
Subjective:    Patient ID: Janice Liu, female    DOB: 1940-08-19, 82 y.o.   MRN: [id number]  HPI  Patient presents to clinic today for her annual exam.  Flu: 04/2022 Tetanus: unsure COVID: X 6 Pneumovax: 03/2022 Prevnar: 03/2021 Shingrix: Never Pap smear: no longer screening Mammogram: 12/2022 Bone density: 04/2022 Colon screening: no longer screening Vision screening: as needed Dentist: as needed, dentures  Diet: She does eat meat. She consumes some fruits and veggies. She does eat fried foods. She drinks mostly coffee, water Exercise: None   Review of Systems     Past Medical History:  Diagnosis Date   Allergy    Breast cancer (HCC)    2021   CAD (coronary artery disease)    Chicken pox    Diabetes mellitus (HCC)    Heart murmur    HFrEF (heart failure with reduced ejection fraction) (HCC)    Hypertension    Personal history of radiation therapy     Current Outpatient Medications  Medication Sig Dispense Refill   acetaminophen (TYLENOL) 500 MG tablet Take 500-1,000 mg by mouth every 6 (six) hours as needed (for pain.).     alendronate (FOSAMAX) 70 MG tablet TAKE 1 TABLET BY MOUTH ONCE A WEEK. TAKE WITH A FULL GLASS OF WATER ON AN EMPTY STOMACH. 12 tablet 3   aspirin EC 81 MG tablet Take 1 tablet (81 mg total) by mouth daily. Swallow whole.     calcium carbonate (OS-CAL - DOSED IN MG OF ELEMENTAL CALCIUM) 1250 (500 Ca) MG tablet Take 500 mg by mouth daily.      carvedilol (COREG) 25 MG tablet TAKE 1 TABLET BY MOUTH TWICE A DAY 180 tablet 0   cyanocobalamin 2000 MCG tablet Take 2,000 mcg by mouth daily.     fenofibrate (TRICOR) 145 MG tablet TAKE 1 TABLET BY MOUTH EVERY DAY 90 tablet 1   ferrous sulfate 325 (65 FE) MG tablet TAKE 1 TABLET BY MOUTH EVERY DAY 90 tablet 0   letrozole (FEMARA) 2.5 MG tablet TAKE 1 TABLET BY MOUTH EVERY DAY 90 tablet 3   losartan (COZAAR) 100 MG tablet TAKE 1 TABLET BY MOUTH EVERY DAY. STOP AMLODIPINE 90 tablet 0   Omega-3 Fatty  Acids (FISH OIL) 500 MG CAPS Take 500 mg by mouth daily.     rosuvastatin (CRESTOR) 10 MG tablet TAKE 1 TABLET BY MOUTH EVERY DAY 90 tablet 1   rosuvastatin (CRESTOR) 20 MG tablet Take 1 tablet (20 mg total) by mouth daily. 90 tablet 3   spironolactone (ALDACTONE) 25 MG tablet TAKE 1 TABLET (25 MG TOTAL) BY MOUTH DAILY. 90 tablet 1   No current facility-administered medications for this visit.    No Known Allergies  Family History  Problem Relation Age of Onset   Heart failure Mother    Diabetes Mother    Hypertension Mother    Congestive Heart Failure Brother    Hypertension Brother    Cerebral palsy Son    Congestive Heart Failure Brother    Breast cancer Sister 33   Thyroid cancer Sister     Social History   Socioeconomic History   Marital status: Widowed    Spouse name: Not on file   Number of children: 2   Years of education: Not on file   Highest education level: Not on file  Occupational History   Occupation: Retired  Tobacco Use   Smoking status: Never   Smokeless tobacco: Never  Vaping Use  Vaping status: Never Used  Substance and Sexual Activity   Alcohol use: No    Alcohol/week: 0.0 standard drinks of alcohol   Drug use: No   Sexual activity: Never  Other Topics Concern   Not on file  Social History Narrative   Not on file   Social Determinants of Health   Financial Resource Strain: Low Risk  (05/27/2022)   Overall Financial Resource Strain (CARDIA)    Difficulty of Paying Living Expenses: Not hard at all  Food Insecurity: No Food Insecurity (05/27/2022)   Hunger Vital Sign    Worried About Running Out of Food in the Last Year: Never true    Ran Out of Food in the Last Year: Never true  Transportation Needs: No Transportation Needs (05/27/2022)   PRAPARE - Administrator, Civil Service (Medical): No    Lack of Transportation (Non-Medical): No  Physical Activity: Insufficiently Active (05/27/2022)   Exercise Vital Sign    Days of  Exercise per Week: 3 days    Minutes of Exercise per Session: 30 min  Stress: No Stress Concern Present (05/27/2022)   [employer] of Occupational Health - Occupational Stress Questionnaire    Feeling of Stress : Only a little  Social Connections: Socially Isolated (05/27/2022)   Social Connection and Isolation Panel [NHANES]    Frequency of Communication with Friends and Family: More than three times a week    Frequency of Social Gatherings with Friends and Family: More than three times a week    Attends Religious Services: Never    Database administrator or Organizations: No    Attends Banker Meetings: Never    Marital Status: Widowed  Intimate Partner Violence: Not At Risk (05/27/2022)   Humiliation, Afraid, Rape, and Kick questionnaire    Fear of Current or Ex-Partner: No    Emotionally Abused: No    Physically Abused: No    Sexually Abused: No     Constitutional: Denies fever, malaise, fatigue, headache or abrupt weight changes.  HEENT: Denies eye pain, eye redness, ear pain, ringing in the ears, wax buildup, runny nose, nasal congestion, bloody nose, or sore throat. Respiratory: Denies difficulty breathing, shortness of breath, cough or sputum production.   Cardiovascular: Denies chest pain, chest tightness, palpitations or swelling in the hands or feet.  Gastrointestinal: Denies abdominal pain, bloating, constipation, diarrhea or blood in the stool.  GU: Denies urgency, frequency, pain with urination, burning sensation, blood in urine, odor or discharge. Musculoskeletal: Denies decrease in range of motion, difficulty with gait, muscle pain or joint pain and swelling.  Skin: Denies redness, rashes, lesions or ulcercations.  Neurological: Denies dizziness, difficulty with memory, difficulty with speech or problems with balance and coordination.  Psych: Denies anxiety, depression, SI/HI.  No other specific complaints in a complete review of systems (except as  listed in HPI above).  Objective:   Physical Exam BP 134/82 (BP Location: Left Arm, Patient Position: Sitting, Cuff Size: Normal)   Pulse 77   Temp (!) 96.8 F (36 C) (Temporal)   Wt 154 lb (69.9 kg)   SpO2 95%   BMI 27.28 kg/m   Wt Readings from Last 3 Encounters:  04/02/23 154 lb 12.8 oz (70.2 kg)  01/07/23 154 lb 4 oz (70 kg)  12/23/22 156 lb (70.8 kg)    General: Appears her stated age, overweight, in NAD. Skin: Warm, dry and intact. No ulcerations noted. HEENT: Head: normal shape and size; Eyes: sclera white, no  icterus, conjunctiva pink, PERRLA and EOMs intact;  Neck:  Neck supple, trachea midline. No masses, lumps or thyromegaly present.  Cardiovascular: Normal rate and rhythm. S1,S2 noted.  No murmur, rubs or gallops noted. No JVD or BLE edema. No carotid bruits noted. Pulmonary/Chest: Normal effort and positive vesicular breath sounds. No respiratory distress. No wheezes, rales or ronchi noted.  Abdomen:  Normal bowel sounds. Musculoskeletal: Kyphotic.  Strength 5/5 BUE/BLE.  No difficulty with gait.  Neurological: Alert and oriented. Cranial nerves II-XII grossly intact. Coordination normal.  Psychiatric: Mood and affect normal. Behavior is normal. Judgment and thought content normal.   BMET    Component Value Date/Time   NA 140 10/01/2022 1426   NA 148 (H) 07/10/2021 0938   K 4.6 10/01/2022 1426   CL 107 10/01/2022 1426   CO2 24 10/01/2022 1426   GLUCOSE 116 (H) 10/01/2022 1426   BUN 19 10/01/2022 1426   BUN 20 07/10/2021 0938   CREATININE 1.02 (H) 10/01/2022 1426   CALCIUM 9.6 10/01/2022 1426   GFRNONAA 49 (L) 12/08/2019 1113   GFRAA 57 (L) 12/08/2019 1113    Lipid Panel     Component Value Date/Time   CHOL 155 10/01/2022 1426   CHOL 274 (H) 08/15/2015 0803   TRIG 225 (H) 10/01/2022 1426   HDL 47 (L) 10/01/2022 1426   HDL 38 (L) 08/15/2015 0803   CHOLHDL 3.3 10/01/2022 1426   VLDL 31.4 03/09/2020 0949   LDLCALC 77 10/01/2022 1426    CBC     Component Value Date/Time   WBC 8.2 10/01/2022 1426   RBC 4.29 10/01/2022 1426   HGB 13.0 10/01/2022 1426   HGB 14.5 10/20/2020 1521   HCT 38.3 10/01/2022 1426   HCT 43.4 10/20/2020 1521   PLT 438 (H) 10/01/2022 1426   PLT 438 10/20/2020 1521   MCV 89.3 10/01/2022 1426   MCV 90 10/20/2020 1521   MCH 30.3 10/01/2022 1426   MCHC 33.9 10/01/2022 1426   RDW 12.3 10/01/2022 1426   RDW 11.9 10/20/2020 1521   LYMPHSABS 2.5 10/20/2020 1521   MONOABS 0.6 12/08/2019 1113   EOSABS 0.2 10/20/2020 1521   BASOSABS 0.0 10/20/2020 1521    Hgb A1C Lab Results  Component Value Date   HGBA1C 7.0 (H) 10/01/2022            Assessment & Plan:  Preventative health maintenance:  Flu shot today She declines tetanus for financial reasons, advised her if she gets better To go get this done COVID-vaccine UTD Pneumovax and Prevnar UTD Discussed Shingrix vaccine, she will check coverage with her insurance company and schedule a visit if she would like to have this done She no longer needs to screen for cervical cancer Mammogram UTD Bone density UTD She no longer needs to screen for colon cancer Encouraged her to consume a balanced diet and exercise regimen Advised her to see an eye doctor and dentist annually Will check CBC, CMET, lipid, A1c today  RTC in 6 months, follow-up chronic conditions Nicki Reaper, NP

## 2023-04-11 ENCOUNTER — Other Ambulatory Visit: Payer: Self-pay | Admitting: Internal Medicine

## 2023-04-11 LAB — CBC
HCT: 38.6 % (ref 35.0–45.0)
Hemoglobin: 12.7 g/dL (ref 11.7–15.5)
MCH: 30.2 pg (ref 27.0–33.0)
MCHC: 32.9 g/dL (ref 32.0–36.0)
MCV: 91.7 fL (ref 80.0–100.0)
MPV: 9.8 fL (ref 7.5–12.5)
Platelets: 389 10*3/uL (ref 140–400)
RBC: 4.21 10*6/uL (ref 3.80–5.10)
RDW: 12.5 % (ref 11.0–15.0)
WBC: 8.4 10*3/uL (ref 3.8–10.8)

## 2023-04-11 LAB — COMPLETE METABOLIC PANEL WITH GFR
AG Ratio: 1.8 (calc) (ref 1.0–2.5)
ALT: 15 U/L (ref 6–29)
AST: 16 U/L (ref 10–35)
Albumin: 4.4 g/dL (ref 3.6–5.1)
Alkaline phosphatase (APISO): 31 U/L — ABNORMAL LOW (ref 37–153)
BUN/Creatinine Ratio: 20 (calc) (ref 6–22)
BUN: 20 mg/dL (ref 7–25)
CO2: 24 mmol/L (ref 20–32)
Calcium: 9.3 mg/dL (ref 8.6–10.4)
Chloride: 108 mmol/L (ref 98–110)
Creat: 1 mg/dL — ABNORMAL HIGH (ref 0.60–0.95)
Globulin: 2.5 g/dL (ref 1.9–3.7)
Glucose, Bld: 111 mg/dL — ABNORMAL HIGH (ref 65–99)
Potassium: 4.5 mmol/L (ref 3.5–5.3)
Sodium: 141 mmol/L (ref 135–146)
Total Bilirubin: 0.4 mg/dL (ref 0.2–1.2)
Total Protein: 6.9 g/dL (ref 6.1–8.1)
eGFR: 56 mL/min/{1.73_m2} — ABNORMAL LOW (ref >=60)

## 2023-04-11 LAB — HEMOGLOBIN A1C
Hgb A1c MFr Bld: 6.8 %{Hb} — ABNORMAL HIGH (ref <5.7)
Mean Plasma Glucose: 148 mg/dL
eAG (mmol/L): 8.2 mmol/L

## 2023-04-11 LAB — LIPID PANEL
Cholesterol: 131 mg/dL (ref <200)
HDL: 48 mg/dL — ABNORMAL LOW (ref >=50)
LDL Cholesterol (Calc): 57 mg/dL
Non-HDL Cholesterol (Calc): 83 mg/dL (ref <130)
Total CHOL/HDL Ratio: 2.7 (calc) (ref <5.0)
Triglycerides: 192 mg/dL — ABNORMAL HIGH (ref <150)

## 2023-04-11 NOTE — Telephone Encounter (Signed)
Requested medication (s) are due for refill today: clarify diagnosis  Requested medication (s) are on the active medication list: yes  Last refill:  04/10/23  Future visit scheduled: yes  Notes to clinic:  Pharmacy comment: Script Clarification:DIAGNOSIS REQUIRED. DIAGNOSIS CODE NOT PROVIDED, IS INVALID, OR NOT COVERED FOR PRESCRIBED DRUG. PROVIDE/CLARIFY PATIENT DIAGNOSIS, PROVIDE ALTERNATE THERAPY, OR INITIATE PRIOR AUTH.      Requested Prescriptions  Pending Prescriptions Disp Refills   ACCU-CHEK GUIDE test strip [Pharmacy Med Name: ACCU-CHEK GUIDE TEST STRIP] 100 strip 12    Sig: USE AS INSTRUCTED     Endocrinology: Diabetes - Testing Supplies Passed - 04/10/2023 10:58 AM      Passed - Valid encounter within last 12 months    Recent Outpatient Visits           Yesterday Encounter for general adult medical examination with abnormal findings   [place] [facility] [place], [place] W, NP   6 months ago Type 2 diabetes mellitus with stage 3a chronic kidney disease, without long-term current use of insulin [facility])   [place] [facility] [place], Salvadore Oxford, NP   10 months ago Toenail fungus   [place] [facility] [place], Salvadore Oxford, NP   1 year ago Encounter for general adult medical examination with abnormal findings   [place] [facility], [place] W, NP   1 year ago Type 2 diabetes mellitus without complication, without long-term current use of insulin [facility])   [place] [facility] [place], Salvadore Oxford, NP       Future Appointments             In 6 months Baity, Salvadore Oxford, NP [facility], [facility]

## 2023-04-15 NOTE — Telephone Encounter (Signed)
Call patient, Accu-Chek is not covered by her insurance.  1 touches.  Does she want [place] to send her in a new One Touch meter along with strips and lancets?

## 2023-04-15 NOTE — Telephone Encounter (Signed)
Requested medication (s) are due for refill today: No  Requested medication (s) are on the active medication list: Yes  Last refill:  04/11/23  Future visit scheduled: Yes  Notes to clinic:  See pharmacy request.    Requested Prescriptions  Pending Prescriptions Disp Refills   ACCU-CHEK GUIDE test strip [Pharmacy Med Name: ACCU-CHEK GUIDE TEST STRIP] 100 strip 12    Sig: USE AS INSTRUCTED     Endocrinology: Diabetes - Testing Supplies Passed - 04/11/2023  6:43 PM      Passed - Valid encounter within last 12 months    Recent Outpatient Visits           5 days ago Encounter for general adult medical examination with abnormal findings   [place] [facility] [place], [place] W, NP   6 months ago Type 2 diabetes mellitus with stage 3a chronic kidney disease, without long-term current use of insulin [facility])   [place] [facility] [place], Salvadore Oxford, NP   10 months ago Toenail fungus   [place] [facility] [place], Salvadore Oxford, NP   1 year ago Encounter for general adult medical examination with abnormal findings   [place] [facility] [place], [place] W, NP   1 year ago Type 2 diabetes mellitus without complication, without long-term current use of insulin [facility])   [place] [facility] [place], Salvadore Oxford, NP       Future Appointments             In 5 months Baity, Salvadore Oxford, NP [place] [facility], [facility]

## 2023-04-15 NOTE — Telephone Encounter (Signed)
LMTCB PEC please advise if pt is okay with changing to One Touch Meter?     Thanks,   -Vernona Rieger

## 2023-04-16 MED ORDER — ROSUVASTATIN CALCIUM 40 MG PO TABS: 40.0000 mg | ORAL_TABLET | Freq: Every day | ORAL | 1 refills | Status: DC

## 2023-04-16 NOTE — Addendum Note (Signed)
Addended by: Lorre Munroe on: 04/16/2023 08:11 AM   Modules accepted: Orders

## 2023-04-21 ENCOUNTER — Telehealth: Payer: Self-pay | Admitting: Internal Medicine

## 2023-04-21 NOTE — Telephone Encounter (Signed)
Patients spouse stated she was advise by the [facility] that her calcium was too high and she needed to discontinue her calcium medication. Patient would like for Dr Sampson Si to look over her medications before she stops taking the meds. Please f/u with patient

## 2023-04-22 NOTE — Telephone Encounter (Signed)
Her calcium levels have always been normal here.  Okay to stop the calcium supplement.  We will monitor calcium levels and if they drop, we can always restart at a later date.

## 2023-04-22 NOTE — Telephone Encounter (Signed)
LMTCB 04/22/2023.  PEC please advise Mr. Kirschman when  he calls back.   Thanks,    -Vernona Rieger

## 2023-04-22 NOTE — Telephone Encounter (Signed)
Onalee Hua returned call - Shared provider's note. No further questions.

## 2023-05-14 ENCOUNTER — Other Ambulatory Visit: Payer: Self-pay

## 2023-05-14 MED ORDER — FERROUS SULFATE 325 (65 FE) MG PO TABS: 325.0000 mg | ORAL_TABLET | Freq: Every day | ORAL | 1 refills | Status: DC

## 2023-05-16 ENCOUNTER — Other Ambulatory Visit: Payer: Self-pay

## 2023-05-16 MED ORDER — ACCU-CHEK GUIDE VI STRP: ORAL_STRIP | 1 refills | Status: DC

## 2023-05-26 ENCOUNTER — Telehealth: Payer: Self-pay | Admitting: Cardiovascular Disease

## 2023-05-26 ENCOUNTER — Other Ambulatory Visit: Payer: Self-pay

## 2023-05-26 MED ORDER — CARVEDILOL 25 MG PO TABS: 25.0000 mg | ORAL_TABLET | Freq: Two times a day (BID) | ORAL | 3 refills | Status: DC

## 2023-05-26 NOTE — Telephone Encounter (Signed)
Disp Refills Start End   carvedilol (COREG) 25 MG tablet 180 tablet 3 05/26/2023 --   Sig - Route: Take 1 tablet (25 mg total) by mouth 2 (two) times daily. - Oral   Sent to pharmacy as: carvedilol (COREG) 25 MG tablet   E-Prescribing Status: Receipt confirmed by pharmacy (05/26/2023  9:41 AM EDT)

## 2023-05-26 NOTE — Telephone Encounter (Signed)
Refill request

## 2023-05-26 NOTE — Telephone Encounter (Signed)
*  STAT* If patient is at the pharmacy, call can be transferred to refill team.   1. Which medications need to be refilled? (please list name of each medication and dose if known)   carvedilol (COREG) 25 MG tablet   2. Would you like to learn more about the convenience, safety, & potential cost savings by using the [facility]?   3. Are you open to using the [facility] (Type [facility]. ).  4. Which pharmacy/location (including street and city if local pharmacy) is medication to be sent to?  [id number] - [place], [place] - [address]   5. Do they need a 30 day or 90 day supply?   90 days  Son-in-law/Patient stated patient has 4-5 days left.

## 2023-05-30 ENCOUNTER — Ambulatory Visit (INDEPENDENT_AMBULATORY_CARE_PROVIDER_SITE_OTHER): Payer: Medicare HMO

## 2023-05-30 VITALS — BP 126/70 | Ht 63.0 in | Wt 150.8 lb

## 2023-05-30 DIAGNOSIS — Z0000 Encounter for general adult medical examination without abnormal findings: Secondary | ICD-10-CM | POA: Diagnosis not present

## 2023-05-30 NOTE — Progress Notes (Signed)
Subjective:   ALLIEN Liu is a 82 y.o. female who presents for Medicare Annual (Subsequent) preventive examination.  Visit Complete: In person   Cardiac Risk Factors include: advanced age (>41men, >65 women);diabetes mellitus;dyslipidemia;hypertension     Objective:    Today's Vitals   05/30/23 1345  BP: 126/70  Weight: 150 lb 12.8 oz (68.4 kg)  Height: 5\' 3"  (1.6 m)   Body mass index is 26.71 kg/m.     05/30/2023    1:55 PM 04/02/2023    2:07 PM 12/23/2022   10:42 AM 12/23/2022   10:34 AM 06/24/2022    1:01 PM 12/21/2021   10:40 AM 06/12/2021   11:04 AM  Advanced Directives  Does Patient Have a Medical Advance Directive? No Yes Yes No No No Yes  Type of Special educational needs teacher of [place];Living will;Out of facility DNR (pink MOST or yellow form)     Healthcare Power of [place];Living will;Out of facility DNR (pink MOST or yellow form)  Does patient want to make changes to medical advance directive?  No - Patient declined     No - Patient declined  Copy of Healthcare Power of Attorney in Chart?  No - copy requested No - copy requested    No - copy requested  Would patient like information on creating a medical advance directive? No - Patient declined No - Patient declined     No - Patient declined    Current Medications (verified) Outpatient Encounter Medications as of 05/30/2023  Medication Sig   acetaminophen (TYLENOL) 500 MG tablet Take 500-1,000 mg by mouth every 6 (six) hours as needed (for pain.).   alendronate (FOSAMAX) 70 MG tablet TAKE 1 TABLET BY MOUTH ONCE A WEEK. TAKE WITH A FULL GLASS OF WATER ON AN EMPTY STOMACH.   aspirin EC 81 MG tablet Take 1 tablet (81 mg total) by mouth daily. Swallow whole.   carvedilol (COREG) 25 MG tablet Take 1 tablet (25 mg total) by mouth 2 (two) times daily.   cyanocobalamin 2000 MCG tablet Take 2,000 mcg by mouth daily.   fenofibrate (TRICOR) 145 MG tablet TAKE 1 TABLET BY MOUTH EVERY DAY   ferrous sulfate 325  (65 FE) MG tablet Take 1 tablet (325 mg total) by mouth daily.   glucose blood (ACCU-CHEK GUIDE) test strip Use to check blood sugar once a day.  DX: E11.9   letrozole (FEMARA) 2.5 MG tablet TAKE 1 TABLET BY MOUTH EVERY DAY   losartan (COZAAR) 100 MG tablet TAKE 1 TABLET BY MOUTH EVERY DAY. STOP AMLODIPINE   Omega-3 Fatty Acids (FISH OIL) 500 MG CAPS Take 500 mg by mouth daily.   rosuvastatin (CRESTOR) 40 MG tablet Take 1 tablet (40 mg total) by mouth daily.   spironolactone (ALDACTONE) 25 MG tablet TAKE 1 TABLET (25 MG TOTAL) BY MOUTH DAILY.   calcium carbonate (OS-CAL - DOSED IN MG OF ELEMENTAL CALCIUM) 1250 (500 Ca) MG tablet Take 500 mg by mouth daily.  (Patient not taking: Reported on 05/30/2023)   No facility-administered encounter medications on file as of 05/30/2023.    Allergies (verified) Patient has no known allergies.   History: Past Medical History:  Diagnosis Date   Allergy    Breast cancer (HCC)    2021   CAD (coronary artery disease)    Chicken pox    Diabetes mellitus (HCC)    Heart murmur    HFrEF (heart failure with reduced ejection fraction) (HCC)    Hypertension  Personal history of radiation therapy    Past Surgical History:  Procedure Laterality Date   BREAST BIOPSY Right 11/04/2019   Affirm Bx- positive- Ribbon Clip   BREAST BIOPSY Left 11/04/2019   Affirm Bx- neg- Coil Clip   BREAST BIOPSY Left 11/12/2019   [place] bx/ ribbon clip/ path pending   BREAST CYST ASPIRATION Left 11/12/2019   2 areas   BREAST LUMPECTOMY Right 12/10/2019   Procedure: BREAST LUMPECTOMY;  Surgeon: Campbell Lerner, MD;  Location: [facility] ORS;  Service: General;  Laterality: Right;   CARDIAC CATHETERIZATION     CORONARY STENT INTERVENTION N/A 08/09/2019   Procedure: CORONARY STENT INTERVENTION;  Surgeon: Yvonne Kendall, MD;  Location: [facility] INVASIVE CV LAB;  Service: Cardiovascular;  Laterality: N/A;   LEFT HEART CATH AND CORONARY ANGIOGRAPHY N/A 08/09/2019   Procedure: LEFT  HEART CATH AND CORONARY ANGIOGRAPHY;  Surgeon: Yvonne Kendall, MD;  Location: [facility] INVASIVE CV LAB;  Service: Cardiovascular;  Laterality: N/A;   Family History  Problem Relation Age of Onset   Heart failure Mother    Diabetes Mother    Hypertension Mother    Congestive Heart Failure Brother    Hypertension Brother    Cerebral palsy Son    Congestive Heart Failure Brother    Breast cancer Sister 6   Thyroid cancer Sister    Social History   Socioeconomic History   Marital status: Widowed    Spouse name: Not on file   Number of children: 2   Years of education: Not on file   Highest education level: Not on file  Occupational History   Occupation: Retired  Tobacco Use   Smoking status: Never   Smokeless tobacco: Never  Vaping Use   Vaping status: Never Used  Substance and Sexual Activity   Alcohol use: No    Alcohol/week: 0.0 standard drinks of alcohol   Drug use: No   Sexual activity: Never  Other Topics Concern   Not on file  Social History Narrative   Not on file   Social Determinants of Health   Financial Resource Strain: Low Risk  (05/30/2023)   Overall Financial Resource Strain (CARDIA)    Difficulty of Paying Living Expenses: Not hard at all  Food Insecurity: No Food Insecurity (05/30/2023)   Hunger Vital Sign    Worried About Running Out of Food in the Last Year: Never true    Ran Out of Food in the Last Year: Never true  Transportation Needs: No Transportation Needs (05/30/2023)   PRAPARE - Administrator, Civil Service (Medical): No    Lack of Transportation (Non-Medical): No  Physical Activity: Insufficiently Active (05/30/2023)   Exercise Vital Sign    Days of Exercise per Week: 3 days    Minutes of Exercise per Session: 30 min  Stress: No Stress Concern Present (05/30/2023)   [employer] of Occupational Health - Occupational Stress Questionnaire    Feeling of Stress : Not at all  Social Connections: Socially Isolated  (05/30/2023)   Social Connection and Isolation Panel [NHANES]    Frequency of Communication with Friends and Family: More than three times a week    Frequency of Social Gatherings with Friends and Family: More than three times a week    Attends Religious Services: Never    Database administrator or Organizations: No    Attends Banker Meetings: Never    Marital Status: Widowed    Tobacco Counseling Counseling given: Not Answered   Clinical  Intake:  Pre-visit preparation completed: Yes  Pain : No/denies pain     Nutritional Status: BMI 25 -29 Overweight Nutritional Risks: None Diabetes: Yes CBG done?: No Did pt. bring in CBG monitor from home?: No  How often do you need to have someone help you when you read instructions, pamphlets, or other written materials from your doctor or pharmacy?: 1 - Never  Interpreter Needed?: No  Information entered by :: Kennedy Bucker LPN   Activities of Daily Living    05/30/2023    1:56 PM 04/10/2023    9:21 AM  In your present state of health, do you have any difficulty performing the following activities:  Hearing? 0 0  Vision? 0 0  Difficulty concentrating or making decisions? 0 0  Walking or climbing stairs? 0 0  Dressing or bathing? 0 0  Doing errands, shopping? 0 0  Preparing Food and eating ? N   Using the Toilet? N   In the past six months, have you accidently leaked urine? N   Do you have problems with loss of bowel control? N   Managing your Medications? N   Managing your Finances? N   Housekeeping or managing your Housekeeping? N     Patient Care Team: Lorre Munroe, NP as PCP - General (Internal Medicine) Iran Ouch, MD as PCP - Cardiology (Cardiology) Jeralyn Ruths, MD as Consulting Physician (Oncology) Carmina Miller, MD as Referring Physician (Radiation Oncology) Campbell Lerner, MD as Consulting Physician (General Surgery) Jim Like, RN as Registered Nurse  Indicate any  recent Medical Services you may have received from other than [facility] providers in the past year (date may be approximate).     Assessment:   This is a routine wellness examination for [facility].  Hearing/Vision screen Hearing Screening - Comments:: No aids Vision Screening - Comments:: [facility] in [place]   Goals Addressed             This Visit's Progress    DIET - EAT MORE FRUITS AND VEGETABLES         Depression Screen    05/30/2023    1:53 PM 04/10/2023    9:21 AM 10/01/2022    2:31 PM 09/18/2021    1:42 PM 03/18/2021    8:16 AM 03/09/2020    9:25 AM 03/09/2019    9:59 AM  PHQ 2/9 Scores  PHQ - 2 Score 0 0 6 0 0 1 0  PHQ- 9 Score 0  8 0 0      Fall Risk    05/30/2023    1:56 PM 04/10/2023    9:21 AM 10/01/2022    2:32 PM 05/27/2022    1:55 PM 09/18/2021    1:40 PM  Fall Risk   Falls in the past year? 0 0 0 0 0  Number falls in past yr: 0   0 0  Injury with Fall? 0 0 0 0 0  Risk for fall due to : No Fall Risks No Fall Risks No Fall Risks No Fall Risks No Fall Risks  Follow up Falls prevention discussed;Falls evaluation completed   Falls evaluation completed Falls evaluation completed    MEDICARE RISK AT HOME: Medicare Risk at Home Any stairs in or around the home?: Yes If so, are there any without handrails?: No Home free of loose throw rugs in walkways, pet beds, electrical cords, etc?: Yes Adequate lighting in your home to reduce risk of falls?: Yes Life alert?: No Use of a  cane, walker or w/c?: No Grab bars in the bathroom?: No Shower chair or bench in shower?: Yes Elevated toilet seat or a handicapped toilet?: No  TIMED UP AND GO:  Was the test performed?  Yes  Length of time to ambulate 10 feet: 4 sec Gait steady and fast without use of assistive device    Cognitive Function:        05/30/2023    1:57 PM 05/27/2022    1:57 PM  6CIT Screen  What Year? 0 points 0 points  What month? 0 points 0 points  What time? 0 points 0 points  Count back from 20 2  points 0 points  Months in reverse 0 points 0 points  Repeat phrase 2 points 0 points  Total Score 4 points 0 points    Immunizations Immunization History  Administered Date(s) Administered   Fluad Quad(high Dose 65+) 06/07/2020, 05/03/2022   Fluad Trivalent(High Dose 65+) 04/10/2023   Influenza, High Dose Seasonal PF 05/21/2019   Influenza,inj,Quad PF,6+ Mos 07/31/2017, 06/09/2018   Influenza-Unspecified 05/19/2021   PFIZER(Purple Top)SARS-COV-2 Vaccination 10/04/2019, 10/25/2019, 06/10/2020, 01/18/2021   Pfizer Covid-19 Vaccine Bivalent Booster 23yrs & up 05/19/2021   Pneumococcal Conjugate-13 03/15/2021   Pneumococcal Polysaccharide-23 03/21/2022   Unspecified SARS-COV-2 Vaccination 05/03/2022    TDAP status: Due, Education has been provided regarding the importance of this vaccine. Advised may receive this vaccine at local pharmacy or Health Dept. Aware to provide a copy of the vaccination record if obtained from local pharmacy or Health Dept. Verbalized acceptance and understanding.  Flu Vaccine status: Up to date  Pneumococcal vaccine status: Up to date  Covid-19 vaccine status: Completed vaccines  Qualifies for Shingles Vaccine? Yes   Zostavax completed No   Shingrix Completed?: No.    Education has been provided regarding the importance of this vaccine. Patient has been advised to call insurance company to determine out of pocket expense if they have not yet received this vaccine. Advised may also receive vaccine at local pharmacy or Health Dept. Verbalized acceptance and understanding.  Screening Tests Health Maintenance  Topic Date Due   OPHTHALMOLOGY EXAM  Never done   DTaP/Tdap/Td (1 - Tdap) Never done   Zoster Vaccines- Shingrix (1 of 2) Never done   Diabetic kidney evaluation - Urine ACR  10/02/2023   HEMOGLOBIN A1C  10/10/2023   Diabetic kidney evaluation - eGFR measurement  04/09/2024   FOOT EXAM  04/09/2024   Medicare Annual Wellness (AWV)  05/29/2024    Pneumonia Vaccine 66+ Years old  Completed   INFLUENZA VACCINE  Completed   DEXA SCAN  Completed   HPV VACCINES  Aged Out   COVID-19 Vaccine  Discontinued    Health Maintenance  Health Maintenance Due  Topic Date Due   OPHTHALMOLOGY EXAM  Never done   DTaP/Tdap/Td (1 - Tdap) Never done   Zoster Vaccines- Shingrix (1 of 2) Never done    Colorectal cancer screening: No longer required.   Mammogram status: No longer required due to age.- will have one in May along w/ BDS  Bone Density status: Completed 04/17/22. Results reflect: Bone density results: OSTEOPOROSIS. Repeat every 2 years.  Lung Cancer Screening: (Low Dose CT Chest recommended if Age 43-80 years, 20 pack-year currently smoking OR have quit w/in 15years.) does not qualify.    Additional Screening:  Hepatitis C Screening: does not qualify; Completed no  Vision Screening: Recommended annual ophthalmology exams for early detection of glaucoma and other disorders of the eye. Is the  patient up to date with their annual eye exam?  Yes  Who is the provider or what is the name of the office in which the patient attends annual eye exams? MD in [place] If pt is not established with a provider, would they like to be referred to a provider to establish care? No .   Dental Screening: Recommended annual dental exams for proper oral hygiene  Diabetic Foot Exam: Diabetic Foot Exam: Completed 04/10/23  Community Resource Referral / Chronic Care Management: CRR required this visit?  No   CCM required this visit?  No     Plan:     I have personally reviewed and noted the following in the patient's chart:   Medical and social history Use of alcohol, tobacco or illicit drugs  Current medications and supplements including opioid prescriptions. Patient is not currently taking opioid prescriptions. Functional ability and status Nutritional status Physical activity Advanced directives List of other physicians Hospitalizations,  surgeries, and ER visits in previous 12 months Vitals Screenings to include cognitive, depression, and falls Referrals and appointments  In addition, I have reviewed and discussed with patient certain preventive protocols, quality metrics, and best practice recommendations. A written personalized care plan for preventive services as well as general preventive health recommendations were provided to patient.     Hal Hope, LPN   [id number]/[id number]   After Visit Summary: (In Person-Declined) Patient declined AVS at this time.  Nurse Notes: none

## 2023-05-30 NOTE — Patient Instructions (Addendum)
Janice Liu , Thank you for taking time to come for your Medicare Wellness Visit. I appreciate your ongoing commitment to your health goals. Please review the following plan we discussed and let me know if I can assist you in the future.   Referrals/Orders/Follow-Ups/Clinician Recommendations: none  This is a list of the screening recommended for you and due dates:  Health Maintenance  Topic Date Due   Eye exam for diabetics  Never done   DTaP/Tdap/Td vaccine (1 - Tdap) Never done   Zoster (Shingles) Vaccine (1 of 2) Never done   Yearly kidney health urinalysis for diabetes  10/02/2023   Hemoglobin A1C  10/10/2023   Yearly kidney function blood test for diabetes  04/09/2024   Complete foot exam   04/09/2024   Medicare Annual Wellness Visit  05/29/2024   Pneumonia Vaccine  Completed   Flu Shot  Completed   DEXA scan (bone density measurement)  Completed   HPV Vaccine  Aged Out   COVID-19 Vaccine  Discontinued    Advanced directives: (ACP Link)Information on Advanced Care Planning can be found at [facility] Advance Health Care Directives Advance Health Care Directives ([email or web address]   Next Medicare Annual Wellness Visit scheduled for next year: Yes   06/03/24 @ 3:00 pm in person

## 2023-06-16 ENCOUNTER — Telehealth: Payer: Self-pay | Admitting: Cardiovascular Disease

## 2023-06-16 MED ORDER — LOSARTAN POTASSIUM 100 MG PO TABS: 100.0000 mg | ORAL_TABLET | Freq: Every day | ORAL | 2 refills | Status: DC

## 2023-06-16 NOTE — Telephone Encounter (Signed)
*  STAT* If patient is at the pharmacy, call can be transferred to refill team.   1. Which medications need to be refilled? (please list name of each medication and dose if known) losartan (COZAAR) 100 MG tablet    4. Which pharmacy/location (including street and city if local pharmacy) is medication to be sent to? [id number] [place], [place] - [address]     5. Do they need a 30 day or 90 day supply? 90

## 2023-06-16 NOTE — Telephone Encounter (Signed)
Requested Prescriptions     Pending Prescriptions Disp Refills    losartan (COZAAR) 100 MG tablet 90 tablet 2     Sig: Take 1 tablet (100 mg total) by mouth daily

## 2023-06-25 ENCOUNTER — Inpatient Hospital Stay: Payer: Medicare HMO | Attending: Oncology | Admitting: Oncology

## 2023-06-25 ENCOUNTER — Encounter: Payer: Self-pay | Admitting: Oncology

## 2023-06-25 VITALS — BP 149/80 | HR 84 | Temp 97.5°F | Resp 16 | Ht 63.0 in | Wt 152.0 lb

## 2023-06-25 DIAGNOSIS — C50511 Malignant neoplasm of lower-outer quadrant of right female breast: Secondary | ICD-10-CM

## 2023-06-25 DIAGNOSIS — Z923 Personal history of irradiation: Secondary | ICD-10-CM | POA: Insufficient documentation

## 2023-06-25 DIAGNOSIS — Z79811 Long term (current) use of aromatase inhibitors: Secondary | ICD-10-CM

## 2023-06-25 DIAGNOSIS — M810 Age-related osteoporosis without current pathological fracture: Secondary | ICD-10-CM | POA: Insufficient documentation

## 2023-06-25 DIAGNOSIS — Z170 Estrogen receptor positive status [ER+]: Secondary | ICD-10-CM | POA: Insufficient documentation

## 2023-06-25 DIAGNOSIS — Z1721 Progesterone receptor positive status: Secondary | ICD-10-CM | POA: Diagnosis not present

## 2023-06-25 DIAGNOSIS — Z5181 Encounter for therapeutic drug level monitoring: Secondary | ICD-10-CM | POA: Diagnosis not present

## 2023-06-25 DIAGNOSIS — Z803 Family history of malignant neoplasm of breast: Secondary | ICD-10-CM | POA: Insufficient documentation

## 2023-06-25 NOTE — Progress Notes (Unsigned)
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  INTERVAL HISTORY: Patient returns to clinic today for routine 51-month evaluation.  She currently feels well and is asymptomatic.  She continues to tolerate letrozole and Fosamax without significant side effects. She has no neurologic complaints.  She denies any recent fevers or illnesses.  She has a good appetite and denies weight loss. She has no chest pain, shortness of breath, cough, or hemoptysis.  She denies any nausea, vomiting, constipation, or diarrhea.  She has no melena or hematochezia.  She has no urinary complaints.  Patient offers no specific complaints today.  REVIEW OF SYSTEMS:   Review of Systems  Constitutional: Negative.  Negative for fever, malaise/fatigue and weight loss.  Respiratory: Negative.  Negative for cough and shortness of breath.   Cardiovascular: Negative.  Negative for chest pain and leg swelling.  Gastrointestinal: Negative.  Negative for abdominal pain, blood in stool and melena.  Genitourinary: Negative.  Negative for hematuria.  Musculoskeletal: Negative.  Negative for back pain.  Skin: Negative.  Negative for rash.  Neurological: Negative.  Negative for dizziness, focal weakness, weakness and headaches.  Psychiatric/Behavioral: Negative.  The patient is not nervous/anxious.     As per HPI. Otherwise, a complete review of systems is  negative.  PAST MEDICAL HISTORY: Past Medical History:  Diagnosis Date   Allergy    Breast cancer (HCC)    2021   CAD (coronary artery disease)    Chicken pox    Diabetes mellitus (HCC)    Heart murmur    HFrEF (heart failure with reduced ejection fraction) (HCC)    Hypertension    Personal history of radiation therapy     PAST SURGICAL HISTORY: Past Surgical History:  Procedure Laterality Date   BREAST BIOPSY Right 11/04/2019   Affirm Bx- positive- Ribbon Clip   BREAST BIOPSY Left 11/04/2019   Affirm Bx- neg- Coil Clip   BREAST BIOPSY Left 11/12/2019   [place] bx/ ribbon clip/ path pending   BREAST CYST ASPIRATION Left 11/12/2019   2 areas   BREAST LUMPECTOMY Right 12/10/2019   Procedure: BREAST LUMPECTOMY;  Surgeon: Campbell Lerner, MD;  Location: [facility] ORS;  Service: General;  Laterality: Right;   CARDIAC CATHETERIZATION     CORONARY STENT INTERVENTION N/A 08/09/2019   Procedure: CORONARY STENT INTERVENTION;  Surgeon: Yvonne Kendall, MD;  Location: [facility] INVASIVE CV LAB;  Service: Cardiovascular;  Laterality: N/A;   LEFT HEART CATH AND CORONARY ANGIOGRAPHY N/A 08/09/2019   Procedure: LEFT HEART CATH AND CORONARY ANGIOGRAPHY;  Surgeon: Yvonne Kendall, MD;  Location: [facility] INVASIVE CV LAB;  Service: Cardiovascular;  Laterality: N/A;    FAMILY HISTORY: Family History  Problem Relation Age of Onset   Heart failure Mother    Diabetes Mother    Hypertension Mother    Congestive Heart Failure Brother    Hypertension Brother    Cerebral palsy Son    Congestive Heart Failure Brother    Breast cancer Sister 42   Thyroid cancer Sister  ADVANCED DIRECTIVES (Y/N):  N  HEALTH MAINTENANCE: Social History   Tobacco Use   Smoking status: Never   Smokeless tobacco: Never  Vaping Use   Vaping status: Never Used  Substance Use Topics   Alcohol use: No    Alcohol/week: 0.0 standard drinks of alcohol   Drug use: No     Colonoscopy:  PAP:  Bone density:  Lipid  panel:  No Known Allergies  Current Outpatient Medications  Medication Sig Dispense Refill   acetaminophen (TYLENOL) 500 MG tablet Take 500-1,000 mg by mouth every 6 (six) hours as needed (for pain.).     alendronate (FOSAMAX) 70 MG tablet TAKE 1 TABLET BY MOUTH ONCE A WEEK. TAKE WITH A FULL GLASS OF WATER ON AN EMPTY STOMACH. 12 tablet 3   aspirin EC 81 MG tablet Take 1 tablet (81 mg total) by mouth daily. Swallow whole.     carvedilol (COREG) 25 MG tablet Take 1 tablet (25 mg total) by mouth 2 (two) times daily. 180 tablet 3   fenofibrate (TRICOR) 145 MG tablet TAKE 1 TABLET BY MOUTH EVERY DAY 90 tablet 1   ferrous sulfate 325 (65 FE) MG tablet Take 1 tablet (325 mg total) by mouth daily. 90 tablet 1   glucose blood (ACCU-CHEK GUIDE) test strip Use to check blood sugar once a day.  DX: E11.9 100 strip 1   letrozole (FEMARA) 2.5 MG tablet TAKE 1 TABLET BY MOUTH EVERY DAY 90 tablet 3   losartan (COZAAR) 100 MG tablet Take 1 tablet (100 mg total) by mouth daily. 90 tablet 2   Omega-3 Fatty Acids (FISH OIL) 500 MG CAPS Take 500 mg by mouth daily.     rosuvastatin (CRESTOR) 40 MG tablet Take 1 tablet (40 mg total) by mouth daily. 90 tablet 1   spironolactone (ALDACTONE) 25 MG tablet TAKE 1 TABLET (25 MG TOTAL) BY MOUTH DAILY. 90 tablet 1   cyanocobalamin 2000 MCG tablet Take 2,000 mcg by mouth daily. (Patient not taking: Reported on 06/25/2023)     No current facility-administered medications for this visit.    OBJECTIVE: Vitals:   06/25/23 1033  BP: (!) 149/80  Pulse: 84  Resp: 16  Temp: (!) 97.5 F (36.4 C)  SpO2: 99%     Body mass index is 26.93 kg/m.    ECOG FS:0 - Asymptomatic  General: Well-developed, well-nourished, no acute distress. Eyes: Pink conjunctiva, anicteric sclera. HEENT: Normocephalic, moist mucous membranes. Lungs: No audible wheezing or coughing. Heart: Regular rate and rhythm. Abdomen: Soft, nontender, no obvious distention. Musculoskeletal: No edema,  cyanosis, or clubbing. Neuro: Alert, answering all questions appropriately. Cranial nerves grossly intact. Skin: No rashes or petechiae noted. Psych: Normal affect.  LAB RESULTS:  Lab Results  Component Value Date   NA 141 04/10/2023   K 4.5 04/10/2023   CL 108 04/10/2023   CO2 24 04/10/2023   GLUCOSE 111 (H) 04/10/2023   BUN 20 04/10/2023   CREATININE 1.00 (H) 04/10/2023   CALCIUM 9.3 04/10/2023   PROT 6.9 04/10/2023   ALBUMIN 4.5 03/09/2020   AST 16 04/10/2023   ALT 15 04/10/2023   ALKPHOS 63 03/09/2020   BILITOT 0.4 04/10/2023   GFRNONAA 49 (L) 12/08/2019   GFRAA 57 (L) 12/08/2019    Lab Results  Component Value Date   WBC 8.4 04/10/2023   NEUTROABS 5.8 10/20/2020   HGB 12.7 04/10/2023   HCT 38.6 04/10/2023   MCV 91.7 04/10/2023   PLT 389 04/10/2023   Lab Results  Component Value Date   IRON 74 10/20/2020   TIBC 363 10/20/2020   IRONPCTSAT 20 10/20/2020   Lab Results  Component Value Date   FERRITIN 147 10/20/2020     STUDIES: No results found.  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  Given the fact that she is grade 1 and mucinous, Oncotype DX was not necessary.  Patient completed adjuvant XRT in July 2021.  Continue letrozole for a total of 5 years completing treatment in July 2026.  Patient's most recent mammogram on Dec 19, 2022 was reported as BI-RADS 1.  Repeat in May 2025.  Return to clinic in 6 months for routine evaluation.   Osteoporosis: Patient's most recent bone mineral density on April 17, 2022 was essentially unchanged with a reported T-score of -3.1.  Continue Fosamax, calcium, and vitamin D.  Patient wishes to coordinate her mammogram and  bone density, therefore we will schedule next bone density in May 2025 along with her mammogram.    I spent a total of 20 minutes reviewing chart data, face-to-face evaluation with the patient, counseling and coordination of care as detailed above.   Patient expressed understanding and was in agreement with this plan. She also understands that She can call clinic at any time with any questions, concerns, or complaints.    Cancer Staging  Carcinoma of lower-outer quadrant of female breast, right [facility]) Staging form: Breast, AJCC 8th Edition - Clinical stage from 01/07/2020: Stage IB (cT2, cN0, cM0, G1, ER+, PR+, HER2-) - Signed by Jeralyn Ruths, MD on 01/07/2020 Stage prefix: Initial diagnosis Histologic grading system: 3 grade system   Jeralyn Ruths, MD   06/26/2023 12:17 PM

## 2023-08-25 ENCOUNTER — Other Ambulatory Visit: Payer: Self-pay | Admitting: Oncology

## 2023-08-25 ENCOUNTER — Other Ambulatory Visit: Payer: Self-pay | Admitting: Cardiovascular Disease

## 2023-08-25 ENCOUNTER — Other Ambulatory Visit: Payer: Self-pay | Admitting: Internal Medicine

## 2023-08-26 NOTE — Telephone Encounter (Signed)
 Requested Prescriptions  Refused Prescriptions Disp Refills   rosuvastatin  (CRESTOR ) 40 MG tablet [Pharmacy Med Name: ROSUVASTATIN  CALCIUM  40 MG TAB] 90 tablet 1    Sig: TAKE 1 TABLET BY MOUTH EVERY DAY     Cardiovascular:  Antilipid - Statins 2 Failed - 08/26/2023  2:12 PM      Failed - Cr in normal range and within 360 days    Creat  Date Value Ref Range Status  04/10/2023 1.00 (H) 0.60 - 0.95 mg/dL Final   Creatinine, Urine  Date Value Ref Range Status  10/01/2022 56 20 - 275 mg/dL Final         Failed - Lipid Panel in normal range within the last 12 months    Cholesterol, Total  Date Value Ref Range Status  08/15/2015 274 (H) 100 - 199 mg/dL Final   Cholesterol  Date Value Ref Range Status  04/10/2023 131 <200 mg/dL Final   LDL Cholesterol (Calc)  Date Value Ref Range Status  04/10/2023 57 mg/dL (calc) Final    Comment:    Reference range: <100 . Desirable range <100 mg/dL for primary prevention;   <70 mg/dL for patients with CHD or diabetic patients  with > or = 2 CHD risk factors. SABRA LDL-C is now calculated using the Martin-Hopkins  calculation, which is a validated novel method providing  better accuracy than the Friedewald equation in the  estimation of LDL-C.  Gladis APPLETHWAITE et al. SANDREA. 7986;689(80): 2061-2068  (http://education.[facility].com/faq/FAQ164)    Direct LDL  Date Value Ref Range Status  07/01/2019 87.0 mg/dL Final    Comment:    Optimal:  <100 mg/dLNear or Above Optimal:  100-129 mg/dLBorderline High:  130-159 mg/dLHigh:  160-189 mg/dLVery High:  >190 mg/dL   HDL  Date Value Ref Range Status  04/10/2023 48 (L) > OR = 50 mg/dL Final  98/96/7982 38 (L) >39 mg/dL Final   Triglycerides  Date Value Ref Range Status  04/10/2023 192 (H) <150 mg/dL Final         Passed - Patient is not pregnant      Passed - Valid encounter within last 12 months    Recent Outpatient Visits           4 months ago Encounter for general adult medical  examination with abnormal findings   [place] [facility] [place], Angeline ORN, NP   10 months ago Type 2 diabetes mellitus with stage 3a chronic kidney disease, without long-term current use of insulin  [facility])   [place] [facility] [place], Angeline ORN, NP   1 year ago Toenail fungus   [facility] [place], Angeline ORN, NP   1 year ago Encounter for general adult medical examination with abnormal findings   [place] [facility] [place], [place] W, NP   1 year ago Type 2 diabetes mellitus without complication, without long-term current use of insulin  [facility])   [place] [facility] [place], Angeline ORN, NP       Future Appointments             In 1 month Baity, Angeline ORN, NP [place] [facility], [facility]

## 2023-09-17 ENCOUNTER — Other Ambulatory Visit: Payer: Self-pay | Admitting: Internal Medicine

## 2023-09-18 NOTE — Telephone Encounter (Signed)
 Requested Prescriptions  Pending Prescriptions Disp Refills   fenofibrate  (TRICOR ) 145 MG tablet [Pharmacy Med Name: FENOFIBRATE  145 MG TABLET] 90 tablet 0    Sig: TAKE 1 TABLET BY MOUTH EVERY DAY     Cardiovascular:  Antilipid - Fibric Acid Derivatives Failed - 09/18/2023  2:30 PM      Failed - Cr in normal range and within 360 days    Creat  Date Value Ref Range Status  04/10/2023 1.00 (H) 0.60 - 0.95 mg/dL Final   Creatinine, Urine  Date Value Ref Range Status  10/01/2022 56 20 - 275 mg/dL Final         Failed - Lipid Panel in normal range within the last 12 months    Cholesterol, Total  Date Value Ref Range Status  08/15/2015 274 (H) 100 - 199 mg/dL Final   Cholesterol  Date Value Ref Range Status  04/10/2023 131 <200 mg/dL Final   LDL Cholesterol (Calc)  Date Value Ref Range Status  04/10/2023 57 mg/dL (calc) Final    Comment:    Reference range: <100 . Desirable range <100 mg/dL for primary prevention;   <70 mg/dL for patients with CHD or diabetic patients  with > or = 2 CHD risk factors. SABRA LDL-C is now calculated using the Martin-Hopkins  calculation, which is a validated novel method providing  better accuracy than the Friedewald equation in the  estimation of LDL-C.  Gladis APPLETHWAITE et al. SANDREA. 7986;689(80): 2061-2068  (http://education.[facility].com/faq/FAQ164)    Direct LDL  Date Value Ref Range Status  07/01/2019 87.0 mg/dL Final    Comment:    Optimal:  <100 mg/dLNear or Above Optimal:  100-129 mg/dLBorderline High:  130-159 mg/dLHigh:  160-189 mg/dLVery High:  >190 mg/dL   HDL  Date Value Ref Range Status  04/10/2023 48 (L) > OR = 50 mg/dL Final  98/96/7982 38 (L) >39 mg/dL Final   Triglycerides  Date Value Ref Range Status  04/10/2023 192 (H) <150 mg/dL Final         Passed - ALT in normal range and within 360 days    ALT  Date Value Ref Range Status  04/10/2023 15 6 - 29 U/L Final         Passed - AST in normal range and within 360  days    AST  Date Value Ref Range Status  04/10/2023 16 10 - 35 U/L Final         Passed - HGB in normal range and within 360 days    Hemoglobin  Date Value Ref Range Status  04/10/2023 12.7 11.7 - 15.5 g/dL Final  96/88/7977 85.4 11.1 - 15.9 g/dL Final         Passed - HCT in normal range and within 360 days    HCT  Date Value Ref Range Status  04/10/2023 38.6 35.0 - 45.0 % Final   Hematocrit  Date Value Ref Range Status  10/20/2020 43.4 34.0 - 46.6 % Final         Passed - PLT in normal range and within 360 days    Platelets  Date Value Ref Range Status  04/10/2023 389 140 - 400 Thousand/uL Final  10/20/2020 438 150 - 450 x10E3/uL Final         Passed - WBC in normal range and within 360 days    WBC  Date Value Ref Range Status  04/10/2023 8.4 3.8 - 10.8 Thousand/uL Final         Passed -  eGFR is 30 or above and within 360 days    GFR calc Af Amer  Date Value Ref Range Status  12/08/2019 57 (L) >60 mL/min Final   GFR calc non Af Amer  Date Value Ref Range Status  12/08/2019 49 (L) >60 mL/min Final   GFR  Date Value Ref Range Status  07/14/2020 49.90 (L) >60.00 mL/min Final    Comment:    Calculated using the CKD-EPI Creatinine Equation (2021)   eGFR  Date Value Ref Range Status  04/10/2023 56 (L) > OR = 60 mL/min/1.72m2 Final  07/10/2021 55 (L) >59 mL/min/1.73 Final         Passed - Valid encounter within last 12 months    Recent Outpatient Visits           5 months ago Encounter for general adult medical examination with abnormal findings   [facility] [facility] [place], [place] W, NP   11 months ago Type 2 diabetes mellitus with stage 3a chronic kidney disease, without long-term current use of insulin  [facility])   [facility] [place], Angeline ORN, NP   1 year ago Toenail fungus   [place] [facility] [place], Angeline ORN, NP   1 year ago Encounter for general adult medical examination with abnormal  findings   [place] [facility] [place], [place] W, NP   2 years ago Type 2 diabetes mellitus without complication, without long-term current use of insulin  [facility])   [place] [employer] [facility] [place], Angeline ORN, NP       Future Appointments             In 3 weeks Baity, Angeline ORN, NP [place] [facility], [facility]

## 2023-10-09 ENCOUNTER — Other Ambulatory Visit: Payer: Self-pay | Admitting: Internal Medicine

## 2023-10-09 NOTE — Telephone Encounter (Signed)
 Requested Prescriptions  Pending Prescriptions Disp Refills   rosuvastatin (CRESTOR) 40 MG tablet [Pharmacy Med Name: ROSUVASTATIN CALCIUM 40 MG TAB] 90 tablet 1    Sig: TAKE 1 TABLET BY MOUTH EVERY DAY     Cardiovascular:  Antilipid - Statins 2 Failed - 10/09/2023  4:19 PM      Failed - Cr in normal range and within 360 days    Creat  Date Value Ref Range Status  04/10/2023 1.00 (H) 0.60 - 0.95 mg/dL Final   Creatinine, Urine  Date Value Ref Range Status  10/01/2022 56 20 - 275 mg/dL Final         Failed - Lipid Panel in normal range within the last 12 months    Cholesterol, Total  Date Value Ref Range Status  08/15/2015 274 (H) 100 - 199 mg/dL Final   Cholesterol  Date Value Ref Range Status  04/10/2023 131 <200 mg/dL Final   LDL Cholesterol (Calc)  Date Value Ref Range Status  04/10/2023 57 mg/dL (calc) Final    Comment:    Reference range: <100 . Desirable range <100 mg/dL for primary prevention;   <70 mg/dL for patients with CHD or diabetic patients  with > or = 2 CHD risk factors. Marland Kitchen LDL-C is now calculated using the Martin-Hopkins  calculation, which is a validated novel method providing  better accuracy than the Friedewald equation in the  estimation of LDL-C.  Horald Pollen et al. Lenox Ahr. 1610;960(45): 2061-2068  (http://education.[facility].com/faq/FAQ164)    Direct LDL  Date Value Ref Range Status  07/01/2019 87.0 mg/dL Final    Comment:    Optimal:  <100 mg/dLNear or Above Optimal:  100-129 mg/dLBorderline High:  130-159 mg/dLHigh:  160-189 mg/dLVery High:  >190 mg/dL   HDL  Date Value Ref Range Status  04/10/2023 48 (L) > OR = 50 mg/dL Final  40/98/1191 38 (L) >39 mg/dL Final   Triglycerides  Date Value Ref Range Status  04/10/2023 192 (H) <150 mg/dL Final         Passed - Patient is not pregnant      Passed - Valid encounter within last 12 months    Recent Outpatient Visits           6 months ago Encounter for general adult medical  examination with abnormal findings   [place] [facility] [place], [place], NP   1 year ago Type 2 diabetes mellitus with stage 3a chronic kidney disease, without long-term current use of insulin [facility])   [place] [facility] [place], Salvadore [place], NP   1 year ago Toenail fungus   [facility] [place], Salvadore [place], NP   1 year ago Encounter for general adult medical examination with abnormal findings   [facility] [place], [place] W, NP   2 years ago Type 2 diabetes mellitus without complication, without long-term current use of insulin [facility] [place])   [place] [facility] [place], Salvadore [place], NP       Future Appointments             Tomorrow [place] Si, Salvadore [place], NP  [facility], [facility]

## 2023-10-10 ENCOUNTER — Ambulatory Visit (INDEPENDENT_AMBULATORY_CARE_PROVIDER_SITE_OTHER): Payer: Medicare HMO | Admitting: Internal Medicine

## 2023-10-10 ENCOUNTER — Encounter: Payer: Self-pay | Admitting: Internal Medicine

## 2023-10-10 VITALS — BP 136/68 | Ht 63.0 in | Wt 156.6 lb

## 2023-10-10 DIAGNOSIS — D500 Iron deficiency anemia secondary to blood loss (chronic): Secondary | ICD-10-CM | POA: Diagnosis not present

## 2023-10-10 DIAGNOSIS — E785 Hyperlipidemia, unspecified: Secondary | ICD-10-CM | POA: Diagnosis not present

## 2023-10-10 DIAGNOSIS — Z6827 Body mass index (BMI) 27.0-27.9, adult: Secondary | ICD-10-CM

## 2023-10-10 DIAGNOSIS — E1122 Type 2 diabetes mellitus with diabetic chronic kidney disease: Secondary | ICD-10-CM | POA: Diagnosis not present

## 2023-10-10 DIAGNOSIS — I700 Atherosclerosis of aorta: Secondary | ICD-10-CM

## 2023-10-10 DIAGNOSIS — Z853 Personal history of malignant neoplasm of breast: Secondary | ICD-10-CM

## 2023-10-10 DIAGNOSIS — M810 Age-related osteoporosis without current pathological fracture: Secondary | ICD-10-CM | POA: Diagnosis not present

## 2023-10-10 DIAGNOSIS — I2510 Atherosclerotic heart disease of native coronary artery without angina pectoris: Secondary | ICD-10-CM

## 2023-10-10 DIAGNOSIS — N1831 Chronic kidney disease, stage 3a: Secondary | ICD-10-CM

## 2023-10-10 DIAGNOSIS — I5022 Chronic systolic (congestive) heart failure: Secondary | ICD-10-CM

## 2023-10-10 DIAGNOSIS — E663 Overweight: Secondary | ICD-10-CM | POA: Diagnosis not present

## 2023-10-10 DIAGNOSIS — I214 Non-ST elevation (NSTEMI) myocardial infarction: Secondary | ICD-10-CM

## 2023-10-10 DIAGNOSIS — I10 Essential (primary) hypertension: Secondary | ICD-10-CM | POA: Diagnosis not present

## 2023-10-10 DIAGNOSIS — E1169 Type 2 diabetes mellitus with other specified complication: Secondary | ICD-10-CM

## 2023-10-10 NOTE — Assessment & Plan Note (Signed)
 Encouraged diet and exercise for weight loss ?

## 2023-10-10 NOTE — Patient Instructions (Signed)

## 2023-10-10 NOTE — Assessment & Plan Note (Signed)
 Compensated Monitor daily weights Reinforced DASH diet Continue carvedilol, losartan and spironolactone C-Met today

## 2023-10-10 NOTE — Assessment & Plan Note (Signed)
 C-Met and lipid profile today Encouraged to consume low-fat diet Continue rosuvastatin and fenofibrate Continue aspirin

## 2023-10-10 NOTE — Assessment & Plan Note (Signed)
 Controlled on spironolactone, losartan, and carvedilol C-Met today Reinforced DASH diet and exercise for weight loss

## 2023-10-10 NOTE — Progress Notes (Signed)
 Subjective:    Patient ID: Janice Liu, female    DOB: [birth year], 83 y.o.   MRN: [id number]  HPI  Patient presents to clinic today for follow-up of chronic conditions.  HLD with aortic atherosclerosis, CAD status post MI: Her last LDL was 57, triglycerides 409, 03/2023.  She denies myalgias on rosuvastatin and fenofibrate.  She is taking aspirin as well.  She follows with cardiology.  DM2: Her last A1c was 6.8%, 03/2023.  She is not taking any oral diabetic medication at this time.  Her sugars range 88-167. She checks her feet routinely.  Her last eye exam was > 1 year ago.  Flu 03/2023.  Pneumovax 03/2022.  Prevnar 03/2021.  COVID Pfizer x 4.  HTN: Her BP today is 142/70.  She is taking spironolactone, losartan, carvedilol as prescribed.  ECG from 12/2022 reviewed.  CHF, systolic: She denies chronic cough, SOB, or lower extremity edema. She is taking carvedilol, losartan and spironolactone as prescribed. Echo from 10/2019 reviewed.  History of right breast cancer: s/p lumpectomy and radiation. She is taking letrozole as prescribed. She follows with oncology.  Iron deficiency anemia: Her last H/H was 12.7/38.6, 03/2023. She is taking oral iron as prescribed. She follows with hematology.  CKD 3: Her last creatinine was 1.00, GFR 56, 03/2023. She is on losartan for renal protection. She follows with nephrology.  Osteoporosis: She is taking fosamax as prescribed. She tries to get weight bearing exercise. Bone density from 04/2022 reviewed.   Review of Systems     Past Medical History:  Diagnosis Date   Allergy    Breast cancer (HCC)    2021   CAD (coronary artery disease)    Chicken pox    Diabetes mellitus (HCC)    Heart murmur    HFrEF (heart failure with reduced ejection fraction) (HCC)    Hypertension    Personal history of radiation therapy     Current Outpatient Medications  Medication Sig Dispense Refill   acetaminophen (TYLENOL) 500 MG tablet Take 500-1,000 mg by mouth  every 6 (six) hours as needed (for pain.).     alendronate (FOSAMAX) 70 MG tablet TAKE 1 TABLET BY MOUTH ONCE A WEEK. TAKE WITH A FULL GLASS OF WATER ON AN EMPTY STOMACH. 12 tablet 3   aspirin EC 81 MG tablet Take 1 tablet (81 mg total) by mouth daily. Swallow whole.     carvedilol (COREG) 25 MG tablet Take 1 tablet (25 mg total) by mouth 2 (two) times daily. 180 tablet 3   cyanocobalamin 2000 MCG tablet Take 2,000 mcg by mouth daily. (Patient not taking: Reported on 06/25/2023)     fenofibrate (TRICOR) 145 MG tablet TAKE 1 TABLET BY MOUTH EVERY DAY 90 tablet 0   ferrous sulfate 325 (65 FE) MG tablet Take 1 tablet (325 mg total) by mouth daily. 90 tablet 1   glucose blood (ACCU-CHEK GUIDE) test strip Use to check blood sugar once a day.  DX: E11.9 100 strip 1   letrozole (FEMARA) 2.5 MG tablet TAKE 1 TABLET BY MOUTH EVERY DAY 90 tablet 3   losartan (COZAAR) 100 MG tablet TAKE 1 TABLET BY MOUTH EVERY DAY 90 tablet 2   Omega-3 Fatty Acids (FISH OIL) 500 MG CAPS Take 500 mg by mouth daily.     rosuvastatin (CRESTOR) 40 MG tablet TAKE 1 TABLET BY MOUTH EVERY DAY 90 tablet 1   spironolactone (ALDACTONE) 25 MG tablet TAKE 1 TABLET (25 MG TOTAL) BY MOUTH DAILY. 90  tablet 2   No current facility-administered medications for this visit.    No Known Allergies  Family History  Problem Relation Age of Onset   Heart failure Mother    Diabetes Mother    Hypertension Mother    Congestive Heart Failure Brother    Hypertension Brother    Cerebral palsy Son    Congestive Heart Failure Brother    Breast cancer Sister 70   Thyroid cancer Sister     Social History   Socioeconomic History   Marital status: Widowed    Spouse name: Not on file   Number of children: 2   Years of education: Not on file   Highest education level: Not on file  Occupational History   Occupation: Retired  Tobacco Use   Smoking status: Never   Smokeless tobacco: Never  Vaping Use   Vaping status: Never Used   Substance and Sexual Activity   Alcohol use: No    Alcohol/week: 0.0 standard drinks of alcohol   Drug use: No   Sexual activity: Never  Other Topics Concern   Not on file  Social History Narrative   Not on file   Social Drivers of Health   Financial Resource Strain: Low Risk  (05/30/2023)   Overall Financial Resource Strain (CARDIA)    Difficulty of Paying Living Expenses: Not hard at all  Food Insecurity: No Food Insecurity (05/30/2023)   Hunger Vital Sign    Worried About Running Out of Food in the Last Year: Never true    Ran Out of Food in the Last Year: Never true  Transportation Needs: No Transportation Needs (05/30/2023)   PRAPARE - Administrator, Civil Service (Medical): No    Lack of Transportation (Non-Medical): No  Physical Activity: Insufficiently Active (05/30/2023)   Exercise Vital Sign    Days of Exercise per Week: 3 days    Minutes of Exercise per Session: 30 min  Stress: No Stress Concern Present (05/30/2023)   [employer] of Occupational Health - Occupational Stress Questionnaire    Feeling of Stress : Not at all  Social Connections: Socially Isolated (05/30/2023)   Social Connection and Isolation Panel [NHANES]    Frequency of Communication with Friends and Family: More than three times a week    Frequency of Social Gatherings with Friends and Family: More than three times a week    Attends Religious Services: Never    Database administrator or Organizations: No    Attends Banker Meetings: Never    Marital Status: Widowed  Intimate Partner Violence: Not At Risk (05/30/2023)   Humiliation, Afraid, Rape, and Kick questionnaire    Fear of Current or Ex-Partner: No    Emotionally Abused: No    Physically Abused: No    Sexually Abused: No     Constitutional: Denies fever, malaise, fatigue, headache or abrupt weight changes.  HEENT: Denies eye pain, eye redness, ear pain, ringing in the ears, wax buildup, runny nose,  nasal congestion, bloody nose, or sore throat. Respiratory: Denies difficulty breathing, shortness of breath, cough or sputum production.   Cardiovascular: Denies chest pain, chest tightness, palpitations or swelling in the hands or feet.  Gastrointestinal: Denies abdominal pain, bloating, constipation, diarrhea or blood in the stool.  GU: Denies urgency, frequency, pain with urination, burning sensation, blood in urine, odor or discharge. Musculoskeletal: Denies decrease in range of motion, difficulty with gait, muscle pain or joint pain and swelling.  Skin: Denies redness, rashes,  lesions or ulcercations.  Neurological: Denies dizziness, difficulty with memory, difficulty with speech or problems with balance and coordination.  Psych: Denies anxiety, depression, SI/HI.  No other specific complaints in a complete review of systems (except as listed in HPI above).  Objective:   Physical Exam BP (!) 142/70 (BP Location: Left Arm, Patient Position: Sitting, Cuff Size: Normal)   Ht 5\' 3"  (1.6 m)   Wt 156 lb 9.6 oz (71 kg)   BMI 27.74 kg/m    Wt Readings from Last 3 Encounters:  06/25/23 152 lb (68.9 kg)  05/30/23 150 lb 12.8 oz (68.4 kg)  04/10/23 154 lb (69.9 kg)    General: Appears her stated age, overweight, in NAD. Skin: Warm, dry and intact. No ulcerations noted. HEENT: Head: normal shape and size; Eyes: sclera white, no icterus, conjunctiva pink, PERRLA and EOMs intact;  Neck:  Neck supple, trachea midline. No masses, lumps or thyromegaly present.  Cardiovascular: Normal rate and rhythm. S1,S2 noted.  No murmur, rubs or gallops noted. No JVD or BLE edema. No carotid bruits noted. Pulmonary/Chest: Normal effort and positive vesicular breath sounds. No respiratory distress. No wheezes, rales or ronchi noted.  Abdomen: Soft and nontender. Normal bowel sounds.  Musculoskeletal: Kyphotic.  Shuffling gait but slow and steady without device. Neurological: Alert and oriented. Cranial  nerves II-XII grossly intact. Coordination normal.  Psychiatric: Mood and affect normal. Behavior is normal. Judgment and thought content normal.    BMET    Component Value Date/Time   NA 141 04/10/2023 0919   NA 148 (H) 07/10/2021 0938   K 4.5 04/10/2023 0919   CL 108 04/10/2023 0919   CO2 24 04/10/2023 0919   GLUCOSE 111 (H) 04/10/2023 0919   BUN 20 04/10/2023 0919   BUN 20 07/10/2021 0938   CREATININE 1.00 (H) 04/10/2023 0919   CALCIUM 9.3 04/10/2023 0919   GFRNONAA 49 (L) 12/08/2019 1113   GFRAA 57 (L) 12/08/2019 1113    Lipid Panel     Component Value Date/Time   CHOL 131 04/10/2023 0919   CHOL 274 (H) 08/15/2015 0803   TRIG 192 (H) 04/10/2023 0919   HDL 48 (L) 04/10/2023 0919   HDL 38 (L) 08/15/2015 0803   CHOLHDL 2.7 04/10/2023 0919   VLDL 31.4 03/09/2020 0949   LDLCALC 57 04/10/2023 0919    CBC    Component Value Date/Time   WBC 8.4 04/10/2023 0919   RBC 4.21 04/10/2023 0919   HGB 12.7 04/10/2023 0919   HGB 14.5 10/20/2020 1521   HCT 38.6 04/10/2023 0919   HCT 43.4 10/20/2020 1521   PLT 389 04/10/2023 0919   PLT 438 10/20/2020 1521   MCV 91.7 04/10/2023 0919   MCV 90 10/20/2020 1521   MCH 30.2 04/10/2023 0919   MCHC 32.9 04/10/2023 0919   RDW 12.5 04/10/2023 0919   RDW 11.9 10/20/2020 1521   LYMPHSABS 2.5 10/20/2020 1521   MONOABS 0.6 12/08/2019 1113   EOSABS 0.2 10/20/2020 1521   BASOSABS 0.0 10/20/2020 1521    Hgb A1C Lab Results  Component Value Date   HGBA1C 6.8 (H) 04/10/2023          Assessment & Plan:      RTC in 6 months, for your annual exam Nicki Reaper, NP

## 2023-10-10 NOTE — Assessment & Plan Note (Signed)
C-Met today Continue losartan for renal protection

## 2023-10-10 NOTE — Assessment & Plan Note (Signed)
 A1c and urine microalbumin today Encouraged her to consume a low-carb diet and exercise for weight loss Not medicated Encouraged routine eye exam Encouraged routine foot exam Flu and pneumonia vaccines UTD Encouraged her to get her COVID booster

## 2023-10-10 NOTE — Assessment & Plan Note (Signed)
 C-Met and lipid profile today Encouraged to consume low-fat diet Continue rosuvastatin, fenofibrate and cavedilol Continue aspirin

## 2023-10-10 NOTE — Assessment & Plan Note (Signed)
 C-Met and lipid profile today Encouraged her to consume low-fat diet Continue rosuvastatin and fenofibrate

## 2023-10-10 NOTE — Assessment & Plan Note (Signed)
CBC today Continue oral iron 

## 2023-10-10 NOTE — Assessment & Plan Note (Signed)
Continue letrozole She will continue to follow with oncology 

## 2023-10-10 NOTE — Assessment & Plan Note (Signed)
Continue Fosamax Encourage daily weightbearing exercise 

## 2023-10-10 NOTE — Assessment & Plan Note (Signed)
 C-Met and lipid profile today Encouraged to consume low-fat diet Continue rosuvastatin, fenofibrate and carvedilol Continue aspirin

## 2023-10-11 LAB — MICROALBUMIN / CREATININE URINE RATIO
Creatinine, Urine: 84 mg/dL (ref 20–275)
Microalb, Ur: <0.2 mg/dL

## 2023-10-11 LAB — COMPLETE METABOLIC PANEL WITH GFR
AG Ratio: 2 (calc) (ref 1.0–2.5)
ALT: 14 U/L (ref 6–29)
AST: 19 U/L (ref 10–35)
Albumin: 4.7 g/dL (ref 3.6–5.1)
Alkaline phosphatase (APISO): 32 U/L — ABNORMAL LOW (ref 37–153)
BUN/Creatinine Ratio: 22 (calc) (ref 6–22)
BUN: 25 mg/dL (ref 7–25)
CO2: 26 mmol/L (ref 20–32)
Calcium: 9.7 mg/dL (ref 8.6–10.4)
Chloride: 104 mmol/L (ref 98–110)
Creat: 1.13 mg/dL — ABNORMAL HIGH (ref 0.60–0.95)
Globulin: 2.4 g/dL (ref 1.9–3.7)
Glucose, Bld: 107 mg/dL — ABNORMAL HIGH (ref 65–99)
Potassium: 4.6 mmol/L (ref 3.5–5.3)
Sodium: 139 mmol/L (ref 135–146)
Total Bilirubin: 0.6 mg/dL (ref 0.2–1.2)
Total Protein: 7.1 g/dL (ref 6.1–8.1)
eGFR: 49 mL/min/{1.73_m2} — ABNORMAL LOW (ref >=60)

## 2023-10-11 LAB — CBC
HCT: 38.2 % (ref 35.0–45.0)
Hemoglobin: 12.6 g/dL (ref 11.7–15.5)
MCH: 29.9 pg (ref 27.0–33.0)
MCHC: 33 g/dL (ref 32.0–36.0)
MCV: 90.5 fL (ref 80.0–100.0)
MPV: 9.9 fL (ref 7.5–12.5)
Platelets: 427 10*3/uL — ABNORMAL HIGH (ref 140–400)
RBC: 4.22 10*6/uL (ref 3.80–5.10)
RDW: 12.4 % (ref 11.0–15.0)
WBC: 9 10*3/uL (ref 3.8–10.8)

## 2023-10-11 LAB — HEMOGLOBIN A1C
Hgb A1c MFr Bld: 7.1 %{Hb} — ABNORMAL HIGH (ref <5.7)
Mean Plasma Glucose: 157 mg/dL
eAG (mmol/L): 8.7 mmol/L

## 2023-10-11 LAB — LIPID PANEL
Cholesterol: 132 mg/dL (ref <200)
HDL: 48 mg/dL — ABNORMAL LOW (ref >=50)
LDL Cholesterol (Calc): 59 mg/dL
Non-HDL Cholesterol (Calc): 84 mg/dL (ref <130)
Total CHOL/HDL Ratio: 2.8 (calc) (ref <5.0)
Triglycerides: 176 mg/dL — ABNORMAL HIGH (ref <150)

## 2023-11-02 ENCOUNTER — Other Ambulatory Visit: Payer: Self-pay | Admitting: Internal Medicine

## 2023-11-03 NOTE — Telephone Encounter (Signed)
 Requested medications are due for refill today.  yes  Requested medications are on the active medications list.  yes  Last refill. 05/14/2023 #90 1 rf  Future visit scheduled.   no  Notes to clinic.  Expired labs.    Requested Prescriptions  Pending Prescriptions Disp Refills   ferrous sulfate 325 (65 FE) MG tablet [Pharmacy Med Name: FERROUS SULFATE 325 MG TABLET] 90 tablet 1    Sig: TAKE 1 TABLET BY MOUTH EVERY DAY     Endocrinology:  Minerals - Iron Supplementation Failed - 11/03/2023  5:58 PM      Failed - Fe (serum) in normal range and within 360 days    Iron  Date Value Ref Range Status  10/20/2020 74 27 - 139 ug/dL Final   Iron Saturation  Date Value Ref Range Status  10/20/2020 20 15 - 55 % Final         Failed - Ferritin in normal range and within 360 days    Ferritin  Date Value Ref Range Status  10/20/2020 147 15 - 150 ng/mL Final         Passed - HGB in normal range and within 360 days    Hemoglobin  Date Value Ref Range Status  10/10/2023 12.6 11.7 - 15.5 g/dL Final  45/40/9811 91.4 11.1 - 15.9 g/dL Final         Passed - HCT in normal range and within 360 days    HCT  Date Value Ref Range Status  10/10/2023 38.2 35.0 - 45.0 % Final   Hematocrit  Date Value Ref Range Status  10/20/2020 43.4 34.0 - 46.6 % Final         Passed - RBC in normal range and within 360 days    RBC  Date Value Ref Range Status  10/10/2023 4.22 3.80 - 5.10 Million/uL Final         Passed - Valid encounter within last 12 months    Recent Outpatient Visits           6 months ago Encounter for general adult medical examination with abnormal findings   [place] [facility] [place], [place] W, NP   1 year ago Type 2 diabetes mellitus with stage 3a chronic kidney disease, without long-term current use of insulin [facility])   [place] [facility], Salvadore Oxford, NP   1 year ago Toenail fungus   [place] [facility] [place],  Salvadore Oxford, NP   1 year ago Encounter for general adult medical examination with abnormal findings   [place] [facility] [place], [place] W, NP   2 years ago Type 2 diabetes mellitus without complication, without long-term current use of insulin [facility])   [place] [facility] [place], Salvadore Oxford, NP       Future Appointments             In 2 months Kirke Corin, Chelsea Aus, MD [facility]

## 2023-11-12 ENCOUNTER — Telehealth: Payer: Self-pay | Admitting: Oncology

## 2023-11-12 ENCOUNTER — Inpatient Hospital Stay: Payer: Medicare HMO | Admitting: Oncology

## 2023-11-12 NOTE — Telephone Encounter (Signed)
 Son called to reschedule missed appointment for 4/2. Appointment rescheduled as requested

## 2023-11-24 ENCOUNTER — Encounter: Payer: Self-pay | Admitting: Oncology

## 2023-11-24 ENCOUNTER — Inpatient Hospital Stay: Attending: Oncology | Admitting: Oncology

## 2023-11-24 VITALS — BP 146/74 | HR 87 | Temp 98.5°F | Resp 16 | Ht 63.0 in | Wt 155.6 lb

## 2023-11-24 DIAGNOSIS — Z803 Family history of malignant neoplasm of breast: Secondary | ICD-10-CM | POA: Insufficient documentation

## 2023-11-24 DIAGNOSIS — Z79811 Long term (current) use of aromatase inhibitors: Secondary | ICD-10-CM | POA: Insufficient documentation

## 2023-11-24 DIAGNOSIS — Z1721 Progesterone receptor positive status: Secondary | ICD-10-CM | POA: Diagnosis not present

## 2023-11-24 DIAGNOSIS — Z1732 Human epidermal growth factor receptor 2 negative status: Secondary | ICD-10-CM | POA: Diagnosis not present

## 2023-11-24 DIAGNOSIS — Z923 Personal history of irradiation: Secondary | ICD-10-CM | POA: Insufficient documentation

## 2023-11-24 DIAGNOSIS — C50511 Malignant neoplasm of lower-outer quadrant of right female breast: Secondary | ICD-10-CM | POA: Insufficient documentation

## 2023-11-24 DIAGNOSIS — M810 Age-related osteoporosis without current pathological fracture: Secondary | ICD-10-CM | POA: Diagnosis not present

## 2023-11-24 DIAGNOSIS — Z170 Estrogen receptor positive status [ER+]: Secondary | ICD-10-CM | POA: Insufficient documentation

## 2023-11-24 DIAGNOSIS — Z808 Family history of malignant neoplasm of other organs or systems: Secondary | ICD-10-CM | POA: Diagnosis not present

## 2023-11-24 NOTE — Progress Notes (Signed)
 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  INTERVAL HISTORY: Patient returns to clinic today for routine 17-month evaluation.  She continues to feel well and remains asymptomatic.  She continues to tolerate letrozole and Fosamax without significant side effects. She has no neurologic complaints.  She denies any recent fevers or illnesses.  She has a good appetite and denies weight loss. She has no chest pain, shortness of breath, cough, or hemoptysis.  She denies any nausea, vomiting, constipation, or diarrhea.  She has no melena or hematochezia.  She has no urinary complaints.  Patient offers no specific complaints today.  REVIEW OF SYSTEMS:   Review of Systems  Constitutional: Negative.  Negative for fever, malaise/fatigue and weight loss.  Respiratory: Negative.  Negative for cough and shortness of breath.   Cardiovascular: Negative.  Negative for chest pain and leg swelling.  Gastrointestinal: Negative.  Negative for abdominal pain, blood in stool and melena.  Genitourinary: Negative.  Negative for hematuria.  Musculoskeletal: Negative.  Negative for back pain.  Skin: Negative.  Negative for rash.  Neurological: Negative.  Negative for dizziness, focal weakness, weakness and headaches.  Psychiatric/Behavioral: Negative.  The patient is not nervous/anxious.     As per HPI. Otherwise, a complete review of  systems is negative.  PAST MEDICAL HISTORY: Past Medical History:  Diagnosis Date   Allergy    Breast cancer (HCC)    2021   CAD (coronary artery disease)    Chicken pox    Diabetes mellitus (HCC)    Heart murmur    HFrEF (heart failure with reduced ejection fraction) (HCC)    Hypertension    Personal history of radiation therapy     PAST SURGICAL HISTORY: Past Surgical History:  Procedure Laterality Date   BREAST BIOPSY Right 11/04/2019   Affirm Bx- positive- Ribbon Clip   BREAST BIOPSY Left 11/04/2019   Affirm Bx- neg- Coil Clip   BREAST BIOPSY Left 11/12/2019   [place] bx/ ribbon clip/ path pending   BREAST CYST ASPIRATION Left 11/12/2019   2 areas   BREAST LUMPECTOMY Right 12/10/2019   Procedure: BREAST LUMPECTOMY;  Surgeon: Campbell Lerner, MD;  Location: [facility] ORS;  Service: General;  Laterality: Right;   CARDIAC CATHETERIZATION     CORONARY STENT INTERVENTION N/A 08/09/2019   Procedure: CORONARY STENT INTERVENTION;  Surgeon: Yvonne Kendall, MD;  Location: [facility] INVASIVE CV LAB;  Service: Cardiovascular;  Laterality: N/A;   LEFT HEART CATH AND CORONARY ANGIOGRAPHY N/A 08/09/2019   Procedure: LEFT HEART CATH AND CORONARY ANGIOGRAPHY;  Surgeon: Yvonne Kendall, MD;  Location: [facility] INVASIVE CV LAB;  Service: Cardiovascular;  Laterality: N/A;    FAMILY HISTORY: Family History  Problem Relation Age of Onset   Heart failure Mother    Diabetes Mother    Hypertension Mother    Congestive Heart Failure Brother    Hypertension Brother    Cerebral palsy Son    Congestive Heart Failure Brother    Breast cancer Sister 46   Thyroid cancer  Sister     ADVANCED DIRECTIVES (Y/N):  N  HEALTH MAINTENANCE: Social History   Tobacco Use   Smoking status: Never   Smokeless tobacco: Never  Vaping Use   Vaping status: Never Used  Substance Use Topics   Alcohol use: No    Alcohol/week: 0.0 standard drinks of alcohol   Drug use: No     Colonoscopy:  PAP:  Bone  density:  Lipid panel:  No Known Allergies  Current Outpatient Medications  Medication Sig Dispense Refill   acetaminophen (TYLENOL) 500 MG tablet Take 500-1,000 mg by mouth every 6 (six) hours as needed (for pain.).     alendronate (FOSAMAX) 70 MG tablet TAKE 1 TABLET BY MOUTH ONCE A WEEK. TAKE WITH A FULL GLASS OF WATER ON AN EMPTY STOMACH. 12 tablet 3   aspirin EC 81 MG tablet Take 1 tablet (81 mg total) by mouth daily. Swallow whole.     carvedilol (COREG) 25 MG tablet Take 1 tablet (25 mg total) by mouth 2 (two) times daily. 180 tablet 3   fenofibrate (TRICOR) 145 MG tablet TAKE 1 TABLET BY MOUTH EVERY DAY 90 tablet 0   ferrous sulfate 325 (65 FE) MG tablet TAKE 1 TABLET BY MOUTH EVERY DAY 90 tablet 1   glucose blood (ACCU-CHEK GUIDE) test strip Use to check blood sugar once a day.  DX: E11.9 100 strip 1   letrozole (FEMARA) 2.5 MG tablet TAKE 1 TABLET BY MOUTH EVERY DAY 90 tablet 3   losartan (COZAAR) 100 MG tablet TAKE 1 TABLET BY MOUTH EVERY DAY 90 tablet 2   Omega-3 Fatty Acids (FISH OIL) 500 MG CAPS Take 500 mg by mouth daily.     rosuvastatin (CRESTOR) 40 MG tablet TAKE 1 TABLET BY MOUTH EVERY DAY 90 tablet 1   spironolactone (ALDACTONE) 25 MG tablet TAKE 1 TABLET (25 MG TOTAL) BY MOUTH DAILY. 90 tablet 2   No current facility-administered medications for this visit.    OBJECTIVE: Vitals:   11/24/23 1355  BP: (!) 146/74  Pulse: 87  Resp: 16  Temp: 98.5 F (36.9 C)  SpO2: 99%     Body mass index is 27.56 kg/m.    ECOG FS:0 - Asymptomatic  General: Well-developed, well-nourished, no acute distress. Eyes: Pink conjunctiva, anicteric sclera. HEENT: Normocephalic, moist mucous membranes. Lungs: No audible wheezing or coughing. Heart: Regular rate and rhythm. Abdomen: Soft, nontender, no obvious distention. Musculoskeletal: No edema, cyanosis, or clubbing. Neuro: Alert, answering all questions appropriately. Cranial nerves grossly intact. Skin: No rashes or petechiae  noted. Psych: Normal affect.  LAB RESULTS:  Lab Results  Component Value Date   NA 139 10/10/2023   K 4.6 10/10/2023   CL 104 10/10/2023   CO2 26 10/10/2023   GLUCOSE 107 (H) 10/10/2023   BUN 25 10/10/2023   CREATININE 1.13 (H) 10/10/2023   CALCIUM 9.7 10/10/2023   PROT 7.1 10/10/2023   ALBUMIN 4.5 03/09/2020   AST 19 10/10/2023   ALT 14 10/10/2023   ALKPHOS 63 03/09/2020   BILITOT 0.6 10/10/2023   GFRNONAA 49 (L) 12/08/2019   GFRAA 57 (L) 12/08/2019    Lab Results  Component Value Date   WBC 9.0 10/10/2023   NEUTROABS 5.8 10/20/2020   HGB 12.6 10/10/2023   HCT 38.2 10/10/2023   MCV 90.5 10/10/2023   PLT 427 (H) 10/10/2023   Lab Results  Component Value Date   IRON 74 10/20/2020   TIBC 363 10/20/2020   IRONPCTSAT 20 10/20/2020  Lab Results  Component Value Date   FERRITIN 147 10/20/2020     STUDIES: No results found.  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  Given the fact that she is grade 1 and mucinous, Oncotype DX was not necessary.  Patient completed adjuvant XRT in July 2021.  Continue letrozole for a total of 5 years completing treatment in July 2026.  Patient's most recent mammogram on Dec 19, 2022 was reported as BI-RADS 1.  Patient has her next mammogram scheduled for April 19, 2024.  She will follow-up 1 week later to discuss the results. Osteoporosis: Patient's most recent bone mineral density on April 17, 2022 was essentially unchanged with a reported T-score of -3.1.  Continue Fosamax, calcium, and vitamin D.  Bone mineral density is scheduled in coordination with mammogram as above.  Follow-up as above.  I spent a total of 20 minutes reviewing  chart data, face-to-face evaluation with the patient, counseling and coordination of care as detailed above.   Patient expressed understanding and was in agreement with this plan. She also understands that She can call clinic at any time with any questions, concerns, or complaints.    Cancer Staging  History of breast cancer Staging form: Breast, AJCC 8th Edition - Clinical stage from 01/07/2020: Stage IB (cT2, cN0, cM0, G1, ER+, PR+, HER2-) - Signed by Janice Dials, MD on 01/07/2020 Stage prefix: Initial diagnosis Histologic grading system: 3 grade system   Janice Dials, MD   11/24/2023 2:14 PM

## 2023-11-29 ENCOUNTER — Other Ambulatory Visit: Payer: Self-pay | Admitting: Oncology

## 2023-12-01 ENCOUNTER — Other Ambulatory Visit: Payer: Self-pay | Admitting: Internal Medicine

## 2023-12-02 NOTE — Telephone Encounter (Signed)
 Requested medication (s) are due for refill today: yes  Requested medication (s) are on the active medication list: no  Last refill:  05/16/23  Future visit scheduled: no  Notes to clinic:  Unable to refill per protocol, Rx expired. Not on current list, possible new rx needed      Requested Prescriptions  Pending Prescriptions Disp Refills   ONETOUCH ULTRA test strip [Pharmacy Med Name: ONE TOUCH ULTRA BLUE TEST STRP] 100 strip 1    Sig: USE TO CHECK BLOOD SUGAR ONCE A DAY.     Endocrinology: Diabetes - Testing Supplies Passed - 12/02/2023  1:53 PM      Passed - Valid encounter within last 12 months    Recent Outpatient Visits           1 month ago Type 2 diabetes mellitus with stage 3a chronic kidney disease, without long-term current use of insulin  [facility])   [place] [facility] [place], Rankin Buzzard, NP       Future Appointments             In 1 month Alvenia Aus, Tia Flowers, MD [facility]

## 2023-12-16 ENCOUNTER — Other Ambulatory Visit: Payer: Self-pay | Admitting: Internal Medicine

## 2023-12-17 NOTE — Telephone Encounter (Signed)
 Requested Prescriptions  Pending Prescriptions Disp Refills   fenofibrate  (TRICOR ) 145 MG tablet [Pharmacy Med Name: FENOFIBRATE  145 MG TABLET] 90 tablet 1    Sig: TAKE 1 TABLET BY MOUTH EVERY DAY     Cardiovascular:  Antilipid - Fibric Acid Derivatives Failed - 12/17/2023  2:29 PM      Failed - Cr in normal range and within 360 days    Creat  Date Value Ref Range Status  10/10/2023 1.13 (H) 0.60 - 0.95 mg/dL Final   Creatinine, Urine  Date Value Ref Range Status  10/10/2023 84 20 - 275 mg/dL Final         Failed - PLT in normal range and within 360 days    Platelets  Date Value Ref Range Status  10/10/2023 427 (H) 140 - 400 Thousand/uL Final  10/20/2020 438 150 - 450 x10E3/uL Final         Failed - Lipid Panel in normal range within the last 12 months    Cholesterol, Total  Date Value Ref Range Status  08/15/2015 274 (H) 100 - 199 mg/dL Final   Cholesterol  Date Value Ref Range Status  10/10/2023 132 <200 mg/dL Final   LDL Cholesterol (Calc)  Date Value Ref Range Status  10/10/2023 59 mg/dL (calc) Final    Comment:    Reference range: <100 . Desirable range <100 mg/dL for primary prevention;   <70 mg/dL for patients with CHD or diabetic patients  with > or = 2 CHD risk factors. Aaron Aas LDL-C is now calculated using the Martin-Hopkins  calculation, which is a validated novel method providing  better accuracy than the Friedewald equation in the  estimation of LDL-C.  Melinda Sprawls et al. Erroll Heard. 1610;960(45): 2061-2068  (http://education.[employer].com/faq/FAQ164)    Direct LDL  Date Value Ref Range Status  07/01/2019 87.0 mg/dL Final    Comment:    Optimal:  <100 mg/dLNear or Above Optimal:  100-129 mg/dLBorderline High:  130-159 mg/dLHigh:  160-189 mg/dLVery High:  >190 mg/dL   HDL  Date Value Ref Range Status  10/10/2023 48 (L) > OR = 50 mg/dL Final  40/98/1191 38 (L) >39 mg/dL Final   Triglycerides  Date Value Ref Range Status  10/10/2023 176 (H) <150 mg/dL  Final         Passed - ALT in normal range and within 360 days    ALT  Date Value Ref Range Status  10/10/2023 14 6 - 29 U/L Final         Passed - AST in normal range and within 360 days    AST  Date Value Ref Range Status  10/10/2023 19 10 - 35 U/L Final         Passed - HGB in normal range and within 360 days    Hemoglobin  Date Value Ref Range Status  10/10/2023 12.6 11.7 - 15.5 g/dL Final  47/82/9562 13.0 11.1 - 15.9 g/dL Final         Passed - HCT in normal range and within 360 days    HCT  Date Value Ref Range Status  10/10/2023 38.2 35.0 - 45.0 % Final   Hematocrit  Date Value Ref Range Status  10/20/2020 43.4 34.0 - 46.6 % Final         Passed - WBC in normal range and within 360 days    WBC  Date Value Ref Range Status  10/10/2023 9.0 3.8 - 10.8 Thousand/uL Final         Passed -  eGFR is 30 or above and within 360 days    GFR calc Af Amer  Date Value Ref Range Status  12/08/2019 57 (L) >60 mL/min Final   GFR calc non Af Amer  Date Value Ref Range Status  12/08/2019 49 (L) >60 mL/min Final   GFR  Date Value Ref Range Status  07/14/2020 49.90 (L) >60.00 mL/min Final    Comment:    Calculated using the CKD-EPI Creatinine Equation (2021)   eGFR  Date Value Ref Range Status  10/10/2023 49 (L) > OR = 60 mL/min/1.14m2 Final  07/10/2021 55 (L) >59 mL/min/1.73 Final         Passed - Valid encounter within last 12 months    Recent Outpatient Visits           2 months ago Type 2 diabetes mellitus with stage 3a chronic kidney disease, without long-term current use of insulin  [facility])   [facility] [place], Rankin Buzzard, NP       Future Appointments             In 3 weeks Alvenia Aus, Tia Flowers, MD [facility]

## 2024-01-05 ENCOUNTER — Ambulatory Visit (HOSPITAL_BASED_OUTPATIENT_CLINIC_OR_DEPARTMENT_OTHER)
Admission: EM | Admit: 2024-01-05 | Discharge: 2024-01-05 | Disposition: A | Discharge status: Home / Self Care | Attending: Family Medicine | Admitting: Family Medicine

## 2024-01-05 ENCOUNTER — Encounter (HOSPITAL_BASED_OUTPATIENT_CLINIC_OR_DEPARTMENT_OTHER): Payer: Self-pay | Admitting: Emergency Medicine

## 2024-01-05 DIAGNOSIS — S0081XA Abrasion of other part of head, initial encounter: Secondary | ICD-10-CM | POA: Diagnosis not present

## 2024-01-05 DIAGNOSIS — R220 Localized swelling, mass and lump, head: Secondary | ICD-10-CM | POA: Diagnosis not present

## 2024-01-05 DIAGNOSIS — W010XXA Fall on same level from slipping, tripping and stumbling without subsequent striking against object, initial encounter: Secondary | ICD-10-CM | POA: Diagnosis not present

## 2024-01-05 DIAGNOSIS — S51012A Laceration without foreign body of left elbow, initial encounter: Secondary | ICD-10-CM

## 2024-01-05 DIAGNOSIS — S50312A Abrasion of left elbow, initial encounter: Secondary | ICD-10-CM

## 2024-01-05 MED ORDER — MUPIROCIN 2 % EX OINT: 1.0000 | TOPICAL_OINTMENT | Freq: Two times a day (BID) | CUTANEOUS | 0 refills | Status: AC

## 2024-01-05 NOTE — ED Triage Notes (Addendum)
 Pt [place] [employer] and they was washing the sidewalk and she slipped and fell on her left side of her face. Pt has a swollen area close to her eye and a bruise noted to cheek and eye. Pt is on 81mg  Asprin daily. Pt has a skin tear to left elbow.

## 2024-01-05 NOTE — ED Provider Notes (Signed)
 [facility]    CSN: [id number] Arrival date & time: 01/05/24  1204      History   Chief Complaint Chief Complaint  Patient presents with   Fall   Head Injury    HPI Janice Liu is a 83 y.o. female.   The patient was on the sidewalk in front of the [employer] store today (01/05/2024) at approximately 11:00 AM.  They were cleaning the sidewalk with soapy water.  She slipped in the soapy water and fell on her left side.  She hit her left hip her left elbow and the left side of her face.  She was never unconscious.  She has an abrasion on her left cheek and her left forehead in the eyebrow area and on the left elbow.  She is on an 81 mg aspirin  daily.  She is not having any significant pain right now.   Fall Pertinent negatives include no chest pain, no abdominal pain and no shortness of breath.  Head Injury Associated symptoms: no nausea, no seizures and no vomiting     Past Medical History:  Diagnosis Date   Allergy    Breast cancer (HCC)    2021   CAD (coronary artery disease)    Chicken pox    Diabetes mellitus (HCC)    Heart murmur    HFrEF (heart failure with reduced ejection fraction) (HCC)    Hypertension    Personal history of radiation therapy     Patient Active Problem List   Diagnosis Date Noted   CAD (coronary artery disease) 03/18/2021   Aortic atherosclerosis (HCC) 03/18/2021   CKD (chronic kidney disease) stage 3, GFR 30-59 ml/min (HCC) 03/18/2021   Osteoporosis 03/18/2021   Overweight with body mass index (BMI) of 27 to 27.9 in adult 03/18/2021   History of breast cancer 11/06/2019   Iron  deficiency anemia 10/22/2019   DM (diabetes mellitus), type 2 (HCC) 10/14/2019   CHF (congestive heart failure), NYHA class II, chronic, systolic (HCC) 10/14/2019   NSTEMI (non-ST elevated myocardial infarction) (HCC) 08/09/2019   Hyperlipidemia associated with type 2 diabetes mellitus (HCC) 02/28/2017   Essential hypertension 07/15/2014    Past  Surgical History:  Procedure Laterality Date   BREAST BIOPSY Right 11/04/2019   Affirm Bx- positive- Ribbon Clip   BREAST BIOPSY Left 11/04/2019   Affirm Bx- neg- Coil Clip   BREAST BIOPSY Left 11/12/2019   us  bx/ ribbon clip/ path pending   BREAST CYST ASPIRATION Left 11/12/2019   2 areas   BREAST LUMPECTOMY Right 12/10/2019   Procedure: BREAST LUMPECTOMY;  Surgeon: Flynn Hylan, MD;  Location: [facility] ORS;  Service: General;  Laterality: Right;   CARDIAC CATHETERIZATION     CORONARY STENT INTERVENTION N/A 08/09/2019   Procedure: CORONARY STENT INTERVENTION;  Surgeon: Sammy Crisp, MD;  Location: [facility] INVASIVE CV LAB;  Service: Cardiovascular;  Laterality: N/A;   LEFT HEART CATH AND CORONARY ANGIOGRAPHY N/A 08/09/2019   Procedure: LEFT HEART CATH AND CORONARY ANGIOGRAPHY;  Surgeon: Sammy Crisp, MD;  Location: [facility] INVASIVE CV LAB;  Service: Cardiovascular;  Laterality: N/A;    OB History     Gravida  2   Para  2   Term      Preterm      AB      Living  2      SAB      IAB      Ectopic      Multiple      Live  Births           Obstetric Comments  Menstrual age: 55  Age 1st Pregnancy: 20           Home Medications    Prior to Admission medications   Medication Sig Start Date End Date Taking? Authorizing Provider  acetaminophen  (TYLENOL ) 500 MG tablet Take 500-1,000 mg by mouth every 6 (six) hours as needed (for pain.).   Yes [provider]  alendronate  (FOSAMAX ) 70 MG tablet TAKE 1 TABLET BY MOUTH ONCE A WEEK. TAKE WITH A FULL GLASS OF WATER ON AN EMPTY STOMACH. 12/01/23  Yes Shellie Dials, MD  aspirin  EC 81 MG tablet Take 1 tablet (81 mg total) by mouth daily. Swallow whole. 07/11/20  Yes Wenona Hamilton, MD  carvedilol  (COREG ) 25 MG tablet Take 1 tablet (25 mg total) by mouth 2 (two) times daily. 05/26/23  Yes Wenona Hamilton, MD  fenofibrate  (TRICOR ) 145 MG tablet TAKE 1 TABLET BY MOUTH EVERY DAY 12/17/23  Yes Baity, Rankin Buzzard, NP  letrozole  (FEMARA ) 2.5 MG tablet TAKE 1 TABLET BY MOUTH EVERY DAY 08/25/23  Yes Shellie Dials, MD  losartan  (COZAAR ) 100 MG tablet TAKE 1 TABLET BY MOUTH EVERY DAY 08/25/23  Yes Wenona Hamilton, MD  mupirocin ointment (BACTROBAN) 2 % Apply 1 Application topically 2 (two) times daily. 01/05/24  Yes Guss Legacy, FNP  rosuvastatin  (CRESTOR ) 40 MG tablet TAKE 1 TABLET BY MOUTH EVERY DAY 10/09/23  Yes Baity, Rankin Buzzard, NP  spironolactone  (ALDACTONE ) 25 MG tablet TAKE 1 TABLET (25 MG TOTAL) BY MOUTH DAILY. 08/25/23  Yes Wenona Hamilton, MD  ferrous sulfate  325 (65 FE) MG tablet TAKE 1 TABLET BY MOUTH EVERY DAY 11/04/23   Carollynn Cirri, NP  Omega-3 Fatty Acids (FISH OIL) 500 MG CAPS Take 500 mg by mouth daily.    [provider]  [facility] ULTRA test strip USE TO CHECK BLOOD SUGAR ONCE A DAY. 12/02/23   Carollynn Cirri, NP    Family History Family History  Problem Relation Age of Onset   Heart failure Mother    Diabetes Mother    Hypertension Mother    Congestive Heart Failure Brother    Hypertension Brother    Cerebral palsy Son    Congestive Heart Failure Brother    Breast cancer Sister 27   Thyroid  cancer Sister     Social History Social History   Tobacco Use   Smoking status: Never   Smokeless tobacco: Never  Vaping Use   Vaping status: Never Used  Substance Use Topics   Alcohol use: No    Alcohol/week: 0.0 standard drinks of alcohol   Drug use: No     Allergies   Patient has no known allergies.   Review of Systems Review of Systems  Constitutional:  Negative for chills and fever.  HENT:  Negative for ear pain and sore throat.   Eyes:  Negative for pain and visual disturbance.  Respiratory:  Negative for cough and shortness of breath.   Cardiovascular:  Negative for chest pain and palpitations.  Gastrointestinal:  Negative for abdominal pain, constipation, diarrhea, nausea and vomiting.  Genitourinary:  Negative for dysuria and hematuria.   Musculoskeletal:  Negative for arthralgias and back pain.  Skin:  Positive for wound (Abrasions and skin tears on the left face and left elbow). Negative for color change and rash.  Neurological:  Negative for seizures and syncope.  All other systems reviewed and are negative.  Physical Exam Triage Vital Signs ED Triage Vitals  Encounter Vitals Group     BP 01/05/24 1233 (!) 148/81     Systolic BP Percentile --      Diastolic BP Percentile --      Pulse Rate 01/05/24 1233 80     Resp 01/05/24 1233 18     Temp 01/05/24 1233 98.2 F (36.8 C)     Temp Source 01/05/24 1233 Oral     SpO2 01/05/24 1233 98 %     Weight --      Height --      Head Circumference --      Peak Flow --      Pain Score 01/05/24 1232 0     Pain Loc --      Pain Education --      Exclude from Growth Chart --    No data found.  Updated Vital Signs BP (!) 148/81 (BP Location: Left Arm)   Pulse 80   Temp 98.2 F (36.8 C) (Oral)   Resp 18   SpO2 98%   Visual Acuity Right Eye Distance:   Left Eye Distance:   Bilateral Distance:    Right Eye Near:   Left Eye Near:    Bilateral Near:     Physical Exam Vitals and nursing note reviewed.  Constitutional:      General: She is not in acute distress.    Appearance: She is well-developed. She is not ill-appearing or toxic-appearing.  HENT:     Head: Normocephalic and atraumatic.     Right Ear: Hearing, tympanic membrane, ear canal and external ear normal.     Left Ear: Hearing, tympanic membrane, ear canal and external ear normal.     Nose: No congestion or rhinorrhea.     Right Sinus: No maxillary sinus tenderness or frontal sinus tenderness.     Left Sinus: No maxillary sinus tenderness or frontal sinus tenderness.     Mouth/Throat:     Lips: Pink.     Mouth: Mucous membranes are moist.     Pharynx: Uvula midline. No oropharyngeal exudate or posterior oropharyngeal erythema.     Tonsils: No tonsillar exudate.  Eyes:     Conjunctiva/sclera:  Conjunctivae normal.     Pupils: Pupils are equal, round, and reactive to light.  Cardiovascular:     Rate and Rhythm: Normal rate and regular rhythm.     Heart sounds: S1 normal and S2 normal. No murmur heard. Pulmonary:     Effort: Pulmonary effort is normal. No respiratory distress.     Breath sounds: Normal breath sounds. No decreased breath sounds, wheezing, rhonchi or rales.  Abdominal:     General: Bowel sounds are normal.     Palpations: Abdomen is soft.     Tenderness: There is no abdominal tenderness.  Musculoskeletal:        General: No swelling.     Cervical back: Neck supple.  Lymphadenopathy:     Head:     Right side of head: No submental, submandibular, tonsillar, preauricular or posterior auricular adenopathy.     Left side of head: No submental, submandibular, tonsillar, preauricular or posterior auricular adenopathy.     Cervical: No cervical adenopathy.     Right cervical: No superficial cervical adenopathy.    Left cervical: No superficial cervical adenopathy.  Skin:    General: Skin is warm and dry.     Capillary Refill: Capillary refill takes less than 2 seconds.  Findings: Abrasion (Abrasions on left cheek with some swelling and on the left forehead at the eyebrow.  Both have some bleeding but no specific laceration.  Left elbow with a skin tear that is 3 cm long and 1 cm wide with some abrasion in that area.) present. No rash.  Neurological:     Mental Status: She is alert and oriented to person, place, and time.  Psychiatric:        Mood and Affect: Mood normal.           UC Treatments / Results  Labs (all labs ordered are listed, but only abnormal results are displayed) Labs Reviewed - No data to display  EKG   Radiology No results found.  Procedures Procedures (including critical care time)  Medications Ordered in UC Medications - No data to display  Initial Impression / Assessment and Plan / UC Course  I have reviewed the triage  vital signs and the nursing notes.  Pertinent labs & imaging results that were available during my care of the patient were reviewed by me and considered in my medical decision making (see chart for details).  Plan of Care: Abrasions and skin tear: Clean with warm soapy fingers, rinse, pat dry, apply mupirocin  ointment and may use a bandage if desired.  See discharge instructions for specific instructions and indications of infection or reasons to return.  Follow-up if symptoms do not improve, worsen or new symptoms occur.  I reviewed the plan of care with the patient and/or the patient's guardian.  The patient and/or guardian had time to ask questions and acknowledged that the questions were answered.  I provided instruction on symptoms or reasons to return here or to go to an ER, if symptoms/condition did not improve, worsened or if new symptoms occurred.  Final Clinical Impressions(s) / UC Diagnoses   Final diagnoses:  Abrasion of skin of face  Abrasion of left elbow, initial encounter  Fall on same level from slipping, tripping or stumbling, initial encounter  Facial swelling  Skin tear of left elbow without complication, initial encounter     Discharge Instructions      Clean all the abrasions with warm soapy fingers, rinse, pat dry, apply mupirocin  ointment and cover with a bandage if needed.  Return here if any redness, purulent discharge or pus or if pain significantly worsens or these wounds do not heal.  These are signs of infection and may indicate a need for antibiotics.  Use acetaminophen  500 to 650 mg every 6-8 hours if needed for pain.  Do not use ibuprofen  or other NSAIDs for pain.  Follow-up if symptoms do not improve, worsen or new symptoms occur.   ED Prescriptions     Medication Sig Dispense Auth. Provider   mupirocin  ointment (BACTROBAN ) 2 % Apply 1 Application topically 2 (two) times daily. 22 g Guss Legacy, FNP      PDMP not reviewed this  encounter.   Guss Legacy, FNP 01/05/24 1300

## 2024-01-05 NOTE — Discharge Instructions (Addendum)
 Clean all the abrasions with warm soapy fingers, rinse, pat dry, apply mupirocin ointment and cover with a bandage if needed.  Return here if any redness, purulent discharge or pus or if pain significantly worsens or these wounds do not heal.  These are signs of infection and may indicate a need for antibiotics.  Use acetaminophen  500 to 650 mg every 6-8 hours if needed for pain.  Do not use ibuprofen  or other NSAIDs for pain.  Follow-up if symptoms do not improve, worsen or new symptoms occur.

## 2024-01-13 ENCOUNTER — Encounter: Payer: Self-pay | Admitting: Cardiovascular Disease

## 2024-01-13 ENCOUNTER — Ambulatory Visit: Payer: Self-pay | Admitting: Internal Medicine

## 2024-01-13 ENCOUNTER — Ambulatory Visit: Admitting: Cardiovascular Disease

## 2024-01-13 ENCOUNTER — Encounter: Payer: Self-pay | Admitting: Internal Medicine

## 2024-01-13 ENCOUNTER — Ambulatory Visit: Payer: Self-pay

## 2024-01-13 ENCOUNTER — Ambulatory Visit (INDEPENDENT_AMBULATORY_CARE_PROVIDER_SITE_OTHER): Admitting: Internal Medicine

## 2024-01-13 ENCOUNTER — Ambulatory Visit
Admission: RE | Admit: 2024-01-13 | Discharge: 2024-01-13 | Disposition: A | Discharge status: Home / Self Care | Source: Ambulatory Visit | Attending: Internal Medicine | Admitting: Internal Medicine

## 2024-01-13 VITALS — BP 140/70 | HR 78 | Ht 63.0 in | Wt 157.4 lb

## 2024-01-13 VITALS — BP 118/72 | Ht 63.0 in | Wt 158.2 lb

## 2024-01-13 DIAGNOSIS — C50919 Malignant neoplasm of unspecified site of unspecified female breast: Secondary | ICD-10-CM | POA: Insufficient documentation

## 2024-01-13 DIAGNOSIS — Z955 Presence of coronary angioplasty implant and graft: Secondary | ICD-10-CM | POA: Diagnosis not present

## 2024-01-13 DIAGNOSIS — I252 Old myocardial infarction: Secondary | ICD-10-CM | POA: Insufficient documentation

## 2024-01-13 DIAGNOSIS — R0781 Pleurodynia: Secondary | ICD-10-CM | POA: Insufficient documentation

## 2024-01-13 DIAGNOSIS — Z79899 Other long term (current) drug therapy: Secondary | ICD-10-CM | POA: Insufficient documentation

## 2024-01-13 DIAGNOSIS — W010XXD Fall on same level from slipping, tripping and stumbling without subsequent striking against object, subsequent encounter: Secondary | ICD-10-CM | POA: Diagnosis not present

## 2024-01-13 DIAGNOSIS — R011 Cardiac murmur, unspecified: Secondary | ICD-10-CM

## 2024-01-13 DIAGNOSIS — E785 Hyperlipidemia, unspecified: Secondary | ICD-10-CM

## 2024-01-13 DIAGNOSIS — I110 Hypertensive heart disease with heart failure: Secondary | ICD-10-CM | POA: Diagnosis not present

## 2024-01-13 DIAGNOSIS — Z79811 Long term (current) use of aromatase inhibitors: Secondary | ICD-10-CM | POA: Diagnosis not present

## 2024-01-13 DIAGNOSIS — Z853 Personal history of malignant neoplasm of breast: Secondary | ICD-10-CM | POA: Diagnosis not present

## 2024-01-13 DIAGNOSIS — R918 Other nonspecific abnormal finding of lung field: Secondary | ICD-10-CM | POA: Diagnosis not present

## 2024-01-13 DIAGNOSIS — I10 Essential (primary) hypertension: Secondary | ICD-10-CM | POA: Diagnosis not present

## 2024-01-13 DIAGNOSIS — S8012XD Contusion of left lower leg, subsequent encounter: Secondary | ICD-10-CM

## 2024-01-13 DIAGNOSIS — I2510 Atherosclerotic heart disease of native coronary artery without angina pectoris: Secondary | ICD-10-CM

## 2024-01-13 DIAGNOSIS — E119 Type 2 diabetes mellitus without complications: Secondary | ICD-10-CM | POA: Insufficient documentation

## 2024-01-13 DIAGNOSIS — I5022 Chronic systolic (congestive) heart failure: Secondary | ICD-10-CM | POA: Insufficient documentation

## 2024-01-13 DIAGNOSIS — I255 Ischemic cardiomyopathy: Secondary | ICD-10-CM | POA: Diagnosis not present

## 2024-01-13 DIAGNOSIS — E782 Mixed hyperlipidemia: Secondary | ICD-10-CM | POA: Insufficient documentation

## 2024-01-13 DIAGNOSIS — S51012D Laceration without foreign body of left elbow, subsequent encounter: Secondary | ICD-10-CM

## 2024-01-13 DIAGNOSIS — S0081XD Abrasion of other part of head, subsequent encounter: Secondary | ICD-10-CM

## 2024-01-13 DIAGNOSIS — Z7982 Long term (current) use of aspirin: Secondary | ICD-10-CM | POA: Insufficient documentation

## 2024-01-13 MED ORDER — TIZANIDINE HCL 4 MG PO TABS: 2.0000 mg | ORAL_TABLET | Freq: Three times a day (TID) | ORAL | 0 refills | Status: AC | PRN

## 2024-01-13 NOTE — Patient Instructions (Signed)
 Medication Instructions:   Your physician recommends that you continue on your current medications as directed. Please refer to the Current Medication list given to you today.  *If you need a refill on your cardiac medications before your next appointment, please call your pharmacy*  Lab Work:  No labs ordered today   If you have labs (blood work) drawn today and your tests are completely normal, you will receive your results only by: MyChart Message (if you have MyChart) OR A paper copy in the mail If you have any lab test that is abnormal or we need to change your treatment, we will call you to review the results.  Testing/Procedures:  Your physician has requested that you have an echocardiogram. Echocardiography is a painless test that uses sound waves to create images of your heart. It provides your doctor with information about the size and shape of your heart and how well your heart's chambers and valves are working.   You may receive an ultrasound enhancing agent through an IV if needed to better visualize your heart during the echo. This procedure takes approximately one hour.  There are no restrictions for this procedure.  This will take place at [address] [facility] [address]) [address], [place] [address]  Please note: We ask at that you not bring children with you during ultrasound (echo/ vascular) testing. Due to room size and safety concerns, children are not allowed in the ultrasound rooms during exams. Our front office staff cannot provide observation of children in our lobby area while testing is being conducted. An adult accompanying a patient to their appointment will only be allowed in the ultrasound room at the discretion of the ultrasound technician under special circumstances. We apologize for any inconvenience.   Follow-Up: At [facility], you and your health needs are our priority.  As part of our continuing mission to provide you with exceptional  heart care, our providers are all part of one team.  This team includes your primary Cardiologist (physician) and Advanced Practice Providers or APPs (Physician Assistants and Nurse Practitioners) who all work together to provide you with the care you need, when you need it.  Your next appointment:   12 month(s)  Provider:   Antionette Kirks, MD    We recommend signing up for the patient portal called "MyChart".  Sign up information is provided on this After Visit Summary.  MyChart is used to connect with patients for Virtual Visits (Telemedicine).  Patients are able to view lab/test results, encounter notes, upcoming appointments, etc.  Non-urgent messages can be sent to your provider as well.   To learn more about what you can do with MyChart, go to [email or web address].

## 2024-01-13 NOTE — Progress Notes (Signed)
 Subjective:    Patient ID: Janice Liu, female    DOB: November 06, 1940, 83 y.o.   MRN: [id number]  HPI   Discussed the use of AI scribe software for clinical note transcription with the patient, who gave verbal consent to proceed.  Janice Liu is an 83 year old female who presents with left-sided pain following a fall.  She experienced soreness of her left chest wall after slipping on a soapy, wet sidewalk outside of [employer], landing on her left side and impacting her left breast, left elbow, and left side of her face. Initially, she sought care at urgent care on May 26, where skin tears on her left elbow and left eyebrow were noted. These have since been cleaned and are healing.  She continues to experience pain in her chest and under her left arm, with some numbness extending down her left arm. The pain is more pronounced when taking a deep breath, though there is no shortness of breath. The pain is slowly improving with Tylenol , taken at a dose of 500 mg every six hours, but returns about an hour before the next dose is due.  She also has a bruise on her leg from the fall, which is improving, and no difficulty walking. No headaches, confusion, or vision changes, and no issues with neck or shoulder movement.       Review of Systems     Past Medical History:  Diagnosis Date   Allergy    Breast cancer (HCC)    2021   CAD (coronary artery disease)    Chicken pox    Diabetes mellitus (HCC)    Heart murmur    HFrEF (heart failure with reduced ejection fraction) (HCC)    Hypertension    Personal history of radiation therapy     Current Outpatient Medications  Medication Sig Dispense Refill   acetaminophen  (TYLENOL ) 500 MG tablet Take 500-1,000 mg by mouth every 6 (six) hours as needed (for pain.).     alendronate  (FOSAMAX ) 70 MG tablet TAKE 1 TABLET BY MOUTH ONCE A WEEK. TAKE WITH A FULL GLASS OF WATER ON AN EMPTY STOMACH. 12 tablet 3   aspirin  EC 81 MG tablet Take 1 tablet  (81 mg total) by mouth daily. Swallow whole.     carvedilol  (COREG ) 25 MG tablet Take 1 tablet (25 mg total) by mouth 2 (two) times daily. 180 tablet 3   fenofibrate  (TRICOR ) 145 MG tablet TAKE 1 TABLET BY MOUTH EVERY DAY 90 tablet 1   ferrous sulfate  325 (65 FE) MG tablet TAKE 1 TABLET BY MOUTH EVERY DAY 90 tablet 1   letrozole  (FEMARA ) 2.5 MG tablet TAKE 1 TABLET BY MOUTH EVERY DAY 90 tablet 3   losartan  (COZAAR ) 100 MG tablet TAKE 1 TABLET BY MOUTH EVERY DAY 90 tablet 2   mupirocin  ointment (BACTROBAN ) 2 % Apply 1 Application topically 2 (two) times daily. 22 g 0   Omega-3 Fatty Acids (FISH OIL) 500 MG CAPS Take 500 mg by mouth daily.     ONETOUCH ULTRA test strip USE TO CHECK BLOOD SUGAR ONCE A DAY. 100 strip 1   rosuvastatin  (CRESTOR ) 40 MG tablet TAKE 1 TABLET BY MOUTH EVERY DAY 90 tablet 1   spironolactone  (ALDACTONE ) 25 MG tablet TAKE 1 TABLET (25 MG TOTAL) BY MOUTH DAILY. 90 tablet 2   No current facility-administered medications for this visit.    No Known Allergies  Family History  Problem Relation Age of Onset  Heart failure Mother    Diabetes Mother    Hypertension Mother    Congestive Heart Failure Brother    Hypertension Brother    Cerebral palsy Son    Congestive Heart Failure Brother    Breast cancer Sister 45   Thyroid  cancer Sister     Social History   Socioeconomic History   Marital status: Widowed    Spouse name: Not on file   Number of children: 2   Years of education: Not on file   Highest education level: Not on file  Occupational History   Occupation: Retired  Tobacco Use   Smoking status: Never   Smokeless tobacco: Never  Vaping Use   Vaping status: Never Used  Substance and Sexual Activity   Alcohol use: No    Alcohol/week: 0.0 standard drinks of alcohol   Drug use: No   Sexual activity: Never  Other Topics Concern   Not on file  Social History Narrative   Not on file   Social Drivers of Health   Financial Resource Strain: Low Risk   (05/30/2023)   Overall Financial Resource Strain (CARDIA)    Difficulty of Paying Living Expenses: Not hard at all  Food Insecurity: No Food Insecurity (05/30/2023)   Hunger Vital Sign    Worried About Running Out of Food in the Last Year: Never true    Ran Out of Food in the Last Year: Never true  Transportation Needs: No Transportation Needs (05/30/2023)   PRAPARE - Administrator, Civil Service (Medical): No    Lack of Transportation (Non-Medical): No  Physical Activity: Insufficiently Active (05/30/2023)   Exercise Vital Sign    Days of Exercise per Week: 3 days    Minutes of Exercise per Session: 30 min  Stress: No Stress Concern Present (05/30/2023)   [employer] of Occupational Health - Occupational Stress Questionnaire    Feeling of Stress : Not at all  Social Connections: Socially Isolated (05/30/2023)   Social Connection and Isolation Panel [NHANES]    Frequency of Communication with Friends and Family: More than three times a week    Frequency of Social Gatherings with Friends and Family: More than three times a week    Attends Religious Services: Never    Database administrator or Organizations: No    Attends Banker Meetings: Never    Marital Status: Widowed  Intimate Partner Violence: Not At Risk (05/30/2023)   Humiliation, Afraid, Rape, and Kick questionnaire    Fear of Current or Ex-Partner: No    Emotionally Abused: No    Physically Abused: No    Sexually Abused: No     Constitutional: Denies fever, malaise, fatigue, headache or abrupt weight changes.  Respiratory: Denies difficulty breathing, shortness of breath, cough or sputum production.   Cardiovascular: Denies chest pain, chest tightness, palpitations or swelling in the hands or feet.  Musculoskeletal: Patient reports left chest wall pain.  Denies decrease in range of motion, difficulty with gait, muscle pain or joint  swelling.  Skin: Patient reports a skin tear to left  eyebrow, left elbow and bruising to her left lower leg.  Denies redness, rashes, lesions or ulcercations.  Neurological: Patient reports numbness in her left upper extremity.  Denies dizziness, difficulty with memory, difficulty with speech or problems with balance and coordination.    No other specific complaints in a complete review of systems (except as listed in HPI above).  Objective:   Physical Exam BP 118/72 (  BP Location: Left Arm, Patient Position: Sitting, Cuff Size: Large)   Ht 5\' 3"  (1.6 m)   Wt 158 lb 3.2 oz (71.8 kg)   BMI 28.02 kg/m     Wt Readings from Last 3 Encounters:  11/24/23 155 lb 9.6 oz (70.6 kg)  10/10/23 156 lb 9.6 oz (71 kg)  06/25/23 152 lb (68.9 kg)    General: Appears her stated age, overweight, in NAD. Skin: Warm, dry and intact.  Healing wound noted to left elbow.  Scabbed skin tear noted to left elbow.  Contusion overlying the entire left lateral calf noted. HEENT: Head: normal shape and size; Eyes: sclera white, no icterus, conjunctiva pink, PERRLA and EOMs intact;  Cardiovascular: Normal rate and rhythm.  Pulmonary/Chest: Normal effort and positive vesicular breath sounds. No respiratory distress. No wheezes, rales or ronchi noted.  Musculoskeletal: Kyphotic.  Normal flexion, extension rotation of the cervical spine.  No bony tenderness noted over the cervical spine.  Normal internal and external rotation of right shoulder.  Pain with palpation of the left anterior and lateral chest wall although no crepitus or deformity noted. Neurological: Alert and oriented.    BMET    Component Value Date/Time   NA 139 10/10/2023 1408   NA 148 (H) 07/10/2021 0938   K 4.6 10/10/2023 1408   CL 104 10/10/2023 1408   CO2 26 10/10/2023 1408   GLUCOSE 107 (H) 10/10/2023 1408   BUN 25 10/10/2023 1408   BUN 20 07/10/2021 0938   CREATININE 1.13 (H) 10/10/2023 1408   CALCIUM  9.7 10/10/2023 1408   GFRNONAA 49 (L) 12/08/2019 1113   GFRAA 57 (L) 12/08/2019 1113     Lipid Panel     Component Value Date/Time   CHOL 132 10/10/2023 1408   CHOL 274 (H) 08/15/2015 0803   TRIG 176 (H) 10/10/2023 1408   HDL 48 (L) 10/10/2023 1408   HDL 38 (L) 08/15/2015 0803   CHOLHDL 2.8 10/10/2023 1408   VLDL 31.4 03/09/2020 0949   LDLCALC 59 10/10/2023 1408    CBC    Component Value Date/Time   WBC 9.0 10/10/2023 1408   RBC 4.22 10/10/2023 1408   HGB 12.6 10/10/2023 1408   HGB 14.5 10/20/2020 1521   HCT 38.2 10/10/2023 1408   HCT 43.4 10/20/2020 1521   PLT 427 (H) 10/10/2023 1408   PLT 438 10/20/2020 1521   MCV 90.5 10/10/2023 1408   MCV 90 10/20/2020 1521   MCH 29.9 10/10/2023 1408   MCHC 33.0 10/10/2023 1408   RDW 12.4 10/10/2023 1408   RDW 11.9 10/20/2020 1521   LYMPHSABS 2.5 10/20/2020 1521   MONOABS 0.6 12/08/2019 1113   EOSABS 0.2 10/20/2020 1521   BASOSABS 0.0 10/20/2020 1521    Hgb A1C Lab Results  Component Value Date   HGBA1C 7.1 (H) 10/10/2023          Assessment & Plan:   Assessment and Plan    Left-sided chest and rib pain s/p fall Persistent left chest and underarm pain with numbness in the left arm, exacerbated by deep breathing and coughing, suggesting rib involvement. Pain relief with Tylenol  indicates musculoskeletal origin. Possible rib fracture suspected. - Order x-ray of left ribs to assess for fracture. - Prescribe Zanaflex 4 mg, take half tablet every 8 hours or whole tablet at bedtime, may cause drowsiness. - Advise to continue Tylenol , up to 1000 mg every 8 hours, not exceeding 3000 mg per day.  Skin tears and abrasions Skin tears on left elbow  and left eyebrow healing well. No signs of infection or complications.   RTC in 2 months, for your annual exam Helayne Lo, NP

## 2024-01-13 NOTE — Progress Notes (Signed)
 Cardiology Office Note   Date:  01/13/2024   ID:  Janice Liu, DOB 01-Feb-1941, MRN [id number]  PCP:  Carollynn Cirri, NP  Cardiologist:   Antionette Kirks, MD   Chief Complaint  Patient presents with   Follow-up    12 month f/u no complaints today. Meds reviewed verbally with pt.      History of Present Illness: Janice Liu is a 83 y.o. female who presents for a follow-up visit regarding coronary artery disease and chronic systolic heart failure.   She has chronic medical conditions that include essential hypertension, hyperlipidemia and type 2 diabetes. She presented in December 2020 with non-ST elevation myocardial infarction.  Peak troponin was greater than 27,000.  Cardiac catheterization showed severe one-vessel coronary artery disease with 90% ulcerative stenosis in the mid LAD and moderate proximal LAD and distal RCA disease.  Ejection fraction was 40 to 45% with mildly elevated left ventricular end-diastolic pressure.  She underwent successful angioplasty and drug-eluting stent placement to the mid LAD.  She had a follow-up echocardiogram in March, 2021 that showed improvement in ejection fraction to 50%.  She underwent right lumpectomy in April 2021 for breast cancer.  She has been stable from a cardiac standpoint with no chest pain or shortness of breath.  Unfortunately, she fell on [place] and had some lacerations on the left side.  She slipped on a wet surface and did not have syncope.  Past Medical History:  Diagnosis Date   Allergy    Breast cancer (HCC)    2021   CAD (coronary artery disease)    Chicken pox    Diabetes mellitus (HCC)    Heart murmur    HFrEF (heart failure with reduced ejection fraction) (HCC)    Hypertension    Personal history of radiation therapy     Past Surgical History:  Procedure Laterality Date   BREAST BIOPSY Right 11/04/2019   Affirm Bx- positive- Ribbon Clip   BREAST BIOPSY Left 11/04/2019   Affirm Bx- neg- Coil Clip    BREAST BIOPSY Left 11/12/2019   us  bx/ ribbon clip/ path pending   BREAST CYST ASPIRATION Left 11/12/2019   2 areas   BREAST LUMPECTOMY Right 12/10/2019   Procedure: BREAST LUMPECTOMY;  Surgeon: Flynn Hylan, MD;  Location: [facility] ORS;  Service: General;  Laterality: Right;   CARDIAC CATHETERIZATION     CORONARY STENT INTERVENTION N/A 08/09/2019   Procedure: CORONARY STENT INTERVENTION;  Surgeon: Sammy Crisp, MD;  Location: [facility] INVASIVE CV [facility];  Service: Cardiovascular;  Laterality: N/A;   LEFT HEART CATH AND CORONARY ANGIOGRAPHY N/A 08/09/2019   Procedure: LEFT HEART CATH AND CORONARY ANGIOGRAPHY;  Surgeon: Sammy Crisp, MD;  Location: [facility] INVASIVE CV LAB;  Service: Cardiovascular;  Laterality: N/A;     Current Outpatient Medications  Medication Sig Dispense Refill   acetaminophen  (TYLENOL ) 500 MG tablet Take 500-1,000 mg by mouth every 6 (six) hours as needed (for pain.).     alendronate  (FOSAMAX ) 70 MG tablet TAKE 1 TABLET BY MOUTH ONCE A WEEK. TAKE WITH A FULL GLASS OF WATER ON AN EMPTY STOMACH. 12 tablet 3   aspirin  EC 81 MG tablet Take 1 tablet (81 mg total) by mouth daily. Swallow whole.     carvedilol  (COREG ) 25 MG tablet Take 1 tablet (25 mg total) by mouth 2 (two) times daily. 180 tablet 3   fenofibrate  (TRICOR ) 145 MG tablet TAKE 1 TABLET BY MOUTH EVERY DAY 90 tablet 1   ferrous  sulfate 325 (65 FE) MG tablet TAKE 1 TABLET BY MOUTH EVERY DAY 90 tablet 1   letrozole  (FEMARA ) 2.5 MG tablet TAKE 1 TABLET BY MOUTH EVERY DAY 90 tablet 3   losartan  (COZAAR ) 100 MG tablet TAKE 1 TABLET BY MOUTH EVERY DAY 90 tablet 2   mupirocin  ointment (BACTROBAN ) 2 % Apply 1 Application topically 2 (two) times daily. 22 g 0   Omega-3 Fatty Acids (FISH OIL) 500 MG CAPS Take 500 mg by mouth daily.     ONETOUCH ULTRA test strip USE TO CHECK BLOOD SUGAR ONCE A DAY. 100 strip 1   rosuvastatin  (CRESTOR ) 40 MG tablet TAKE 1 TABLET BY MOUTH EVERY DAY 90 tablet 1   spironolactone  (ALDACTONE ) 25  MG tablet TAKE 1 TABLET (25 MG TOTAL) BY MOUTH DAILY. 90 tablet 2   tiZANidine (ZANAFLEX) 4 MG tablet Take 0.5 tablets (2 mg total) by mouth every 8 (eight) hours as needed for muscle spasms. 15 tablet 0   No current facility-administered medications for this visit.    Allergies:   Patient has no known allergies.    Social History:  The patient  reports that she has never smoked. She has never used smokeless tobacco. She reports that she does not drink alcohol and does not use drugs.   Family History:  The patient's family history includes Breast cancer (age of onset: 74) in her sister; Cerebral palsy in her son; Congestive Heart Failure in her brother and brother; Diabetes in her mother; Heart failure in her mother; Hypertension in her brother and mother; Thyroid  cancer in her sister.    ROS:  Please see the history of present illness.   Otherwise, review of systems are positive for none.   All other systems are reviewed and negative.    PHYSICAL EXAM: VS:  BP (!) 140/70 (BP Location: Left Arm, Patient Position: Sitting, Cuff Size: Large)   Pulse 78   Ht 5\' 3"  (1.6 m)   Wt 157 lb 6 oz (71.4 kg)   SpO2 98%   BMI 27.88 kg/m  , BMI Body mass index is 27.88 kg/m. GEN: Well nourished, well developed, in no acute distress  HEENT: normal  Neck: no JVD, carotid bruits, or masses Cardiac: RRR; no  rubs, or gallops, mild bilateral leg edema edema .  2 out of 6 systolic murmur in the aortic area. Respiratory:  clear to auscultation bilaterally, normal work of breathing GI: soft, nontender, nondistended, + BS MS: no deformity or atrophy  Skin: warm and dry, no rash Neuro:  Strength and sensation are intact Psych: euthymic mood, full affect   EKG:  EKG is ordered today. The ekg ordered today demonstrates : Normal sinus rhythm Minimal voltage criteria for LVH, may be normal variant ( R in aVL ) Cannot rule out Anterior infarct (cited on or before 08-Dec-2019)    Recent  Labs: 10/10/2023: ALT 14; BUN 25; Creat 1.13; Hemoglobin 12.6; Platelets 427; Potassium 4.6; Sodium 139    Lipid Panel    Component Value Date/Time   CHOL 132 10/10/2023 1408   CHOL 274 (H) 08/15/2015 0803   TRIG 176 (H) 10/10/2023 1408   HDL 48 (L) 10/10/2023 1408   HDL 38 (L) 08/15/2015 0803   CHOLHDL 2.8 10/10/2023 1408   VLDL 31.4 03/09/2020 0949   LDLCALC 59 10/10/2023 1408   LDLDIRECT 87.0 07/01/2019 0916      Wt Readings from Last 3 Encounters:  01/13/24 157 lb 6 oz (71.4 kg)  01/13/24 158 lb  3.2 oz (71.8 kg)  11/24/23 155 lb 9.6 oz (70.6 kg)       ASSESSMENT AND PLAN:  1.  Coronary artery disease involving native coronary arteries without angina: She is doing extremely well at the present time with no anginal symptoms.  Continue medical therapy and aspirin  81 mg daily.  2.  Chronic systolic heart failure due to ischemic cardiomyopathy: Ejection fraction was 40 to 45% but improved to 50% on most recent echocardiogram.  Continue carvedilol , losartan  and spironolactone .  I reviewed most recent labs done in February which showed a GFR of 49 with normal electrolytes.  3.  Essential hypertension: Blood pressure is well controlled on current medications.  4.  Mixed hyperlipidemia: Currently on rosuvastatin , fenofibrate  and fish oil.  Most recent lipid profile in February showed an LDL of 59 and triglyceride of 176.  5.  Breast cancer: Followed by oncology and currently is on Femara .  6.  Cardiac murmur: Suggestive of aortic stenosis.  I requested an echocardiogram for evaluation.   Disposition:   Follow-up in 12 months.   Signed,  Antionette Kirks, MD  01/13/2024 2:06 PM    [facility]

## 2024-01-13 NOTE — Telephone Encounter (Signed)
 Copied from CRM [id number]. Topic: Clinical - Red Word Triage >> Jan 13, 2024  8:46 AM Janice Liu wrote: Red Word that prompted transfer to Nurse Triage: Patient had a fall last week, went to urgent care, shoulder has been bothering her ever since. OTC meds not working. Worried about blood clots.  Chief Complaint: fell last week and injured shoulder Symptoms: pain Frequency: constant Pertinent Negatives: Patient denies fever, cp, sob Disposition: [] ED /[] Urgent Care (no appt availability in office) / [x] Appointment(In office/virtual)/ []  [facility]/ [] Home Care/ [] Refused Recommended Disposition /[] [facility]/ []  Follow-up with PCP Additional Notes: was seen in UC last week for fall; apt scheduled; care advice given, denies questions; instructed to go to ER if becomes worse.   Reason for Disposition  [1] Shoulder pains with exertion (e.g., walking) AND [2] pain goes away on resting AND [3] not present now  Answer Assessment - Initial Assessment Questions 1. ONSET: "When did the pain start?"     Fell last week 2. LOCATION: "Where is the pain located?"     Fell on left shoulder; was seen in UC 3. PAIN: "How bad is the pain?" (Scale 1-10; or mild, moderate, severe)   - MILD (1-3): doesn't interfere with normal activities   - MODERATE (4-7): interferes with normal activities (e.g., work or school) or awakens from sleep   - SEVERE (8-10): excruciating pain, unable to do any normal activities, unable to move arm at all due to pain     severe 4. WORK OR EXERCISE: "Has there been any recent work or exercise that involved this part of the body?"     na 5. CAUSE: "What do you think is causing the shoulder pain?"     fall 6. OTHER SYMPTOMS: "Do you have any other symptoms?" (e.g., neck pain, swelling, rash, fever, numbness, weakness)     no 7. PREGNANCY: "Is there any chance you are pregnant?" "When was your last menstrual period?"     na  Protocols used: Shoulder  Pain-A-AH

## 2024-01-13 NOTE — Patient Instructions (Signed)
Chest Wall Pain Chest wall pain is pain in or around the bones and muscles of your chest. Chest wall pain may be caused by: An injury. Coughing a lot. Using your chest and arm muscles too much. Sometimes, the cause may not be known. This pain may take a few [place] or longer to get better. Follow these instructions at home: Managing pain, stiffness, and swelling If told, put ice on the painful area: Put ice in a plastic bag. Place a towel between your skin and the bag. Leave the ice on for 20 minutes, 2-3 times a day.  Activity Rest as told by your doctor. Avoid doing things that cause pain. This includes lifting heavy items. Ask your doctor what activities are safe for you. General instructions  Take over-the-counter and prescription medicines only as told by your doctor. Do not use any products that contain nicotine or tobacco, such as cigarettes, e-cigarettes, and chewing tobacco. If you need help quitting, ask your doctor. Keep all follow-up visits as told by your doctor. This is important. Contact a doctor if: You have a fever. Your chest pain gets worse. You have new symptoms. Get help right away if: You feel sick to your stomach (nauseous) or you throw up (vomit). You feel sweaty or light-headed. You have a cough with mucus from your lungs (sputum) or you cough up blood. You are short of breath. These symptoms may be an emergency. Do not wait to see if the symptoms will go away. Get medical help right away. Call your local emergency services (911 in the [place] Do not drive yourself to the hospital. Summary Chest wall pain is pain in or around the bones and muscles of your chest. It may be treated with ice, rest, and medicines. Your condition may also get better if you avoid doing things that cause pain. Contact a doctor if you have a fever, chest pain that gets worse, or new symptoms. Get help right away if you feel light-headed or you get short of breath. These symptoms may  be an emergency. This information is not intended to replace advice given to you by your health care provider. Make sure you discuss any questions you have with your health care provider. Document Revised: 07/22/2022 Document Reviewed: 07/22/2022 Elsevier Patient Education  2024 [employer].

## 2024-02-18 ENCOUNTER — Ambulatory Visit: Attending: Cardiovascular Disease

## 2024-02-18 DIAGNOSIS — R011 Cardiac murmur, unspecified: Secondary | ICD-10-CM | POA: Diagnosis not present

## 2024-02-18 LAB — ECHOCARDIOGRAM COMPLETE
AR max vel: 3.05 cm2
AV Area VTI: 2.95 cm2
AV Area mean vel: 2.88 cm2
AV Mean grad: 4 mmHg
AV Peak grad: 7.4 mmHg
Ao pk vel: 1.36 m/s
Area-P 1/2: 3.65 cm2
Calc EF: 62.9 %
S' Lateral: 2.6 cm
Single Plane A2C EF: 61.8 %
Single Plane A4C EF: 64 %

## 2024-02-19 ENCOUNTER — Ambulatory Visit: Payer: Self-pay | Admitting: Cardiovascular Disease

## 2024-02-24 ENCOUNTER — Other Ambulatory Visit (HOSPITAL_BASED_OUTPATIENT_CLINIC_OR_DEPARTMENT_OTHER): Payer: Self-pay

## 2024-02-24 ENCOUNTER — Ambulatory Visit (HOSPITAL_BASED_OUTPATIENT_CLINIC_OR_DEPARTMENT_OTHER)
Admission: EM | Admit: 2024-02-24 | Discharge: 2024-02-24 | Disposition: A | Discharge status: Home / Self Care | Attending: Family Medicine | Admitting: Family Medicine

## 2024-02-24 ENCOUNTER — Encounter (HOSPITAL_BASED_OUTPATIENT_CLINIC_OR_DEPARTMENT_OTHER): Payer: Self-pay

## 2024-02-24 DIAGNOSIS — R300 Dysuria: Secondary | ICD-10-CM | POA: Diagnosis not present

## 2024-02-24 LAB — POCT URINALYSIS DIP (MANUAL ENTRY)
Bilirubin, UA: NEGATIVE
Glucose, UA: NEGATIVE mg/dL
Ketones, POC UA: NEGATIVE mg/dL
Nitrite, UA: NEGATIVE
Protein Ur, POC: NEGATIVE mg/dL
Spec Grav, UA: 1.01 (ref 1.010–1.025)
Urobilinogen, UA: 0.2 U/dL
pH, UA: 6 (ref 5.0–8.0)

## 2024-02-24 MED ORDER — CEPHALEXIN 500 MG PO CAPS
500.0000 mg | ORAL_CAPSULE | Freq: Two times a day (BID) | ORAL | 0 refills | Status: AC
  Filled 2024-02-24: qty 14, 7d supply, fill #0

## 2024-02-24 NOTE — ED Provider Notes (Signed)
 [facility]    CSN: [id number] Arrival date & time: 02/24/24  1111      History   Chief Complaint Chief Complaint  Patient presents with   Dysuria    HPI Janice Liu is a 83 y.o. female.   Pt is an 83 year old female that presents with  UTI symptoms for the last 4 days. She has been experiencing urinary frequency, dysuria, hesitancy. Denies hematuria. She has been drinking cranberry juice with no relief.    Dysuria   Past Medical History:  Diagnosis Date   Allergy    Breast cancer (HCC)    2021   CAD (coronary artery disease)    Chicken pox    Diabetes mellitus (HCC)    Heart murmur    HFrEF (heart failure with reduced ejection fraction) (HCC)    Hypertension    Personal history of radiation therapy     Patient Active Problem List   Diagnosis Date Noted   CAD (coronary artery disease) 03/18/2021   Aortic atherosclerosis (HCC) 03/18/2021   CKD (chronic kidney disease) stage 3, GFR 30-59 ml/min (HCC) 03/18/2021   Osteoporosis 03/18/2021   Overweight with body mass index (BMI) of 27 to 27.9 in adult 03/18/2021   History of breast cancer 11/06/2019   Iron  deficiency anemia 10/22/2019   DM (diabetes mellitus), type 2 (HCC) 10/14/2019   CHF (congestive heart failure), NYHA class II, chronic, systolic (HCC) 10/14/2019   NSTEMI (non-ST elevated myocardial infarction) (HCC) 08/09/2019   Hyperlipidemia associated with type 2 diabetes mellitus (HCC) 02/28/2017   Essential hypertension 07/15/2014    Past Surgical History:  Procedure Laterality Date   BREAST BIOPSY Right 11/04/2019   Affirm Bx- positive- Ribbon Clip   BREAST BIOPSY Left 11/04/2019   Affirm Bx- neg- Coil Clip   BREAST BIOPSY Left 11/12/2019   us  bx/ ribbon clip/ path pending   BREAST CYST ASPIRATION Left 11/12/2019   2 areas   BREAST LUMPECTOMY Right 12/10/2019   Procedure: BREAST LUMPECTOMY;  Surgeon: Lane Shope, MD;  Location: [facility];  Service: General;  Laterality: Right;    CARDIAC CATHETERIZATION     CORONARY STENT INTERVENTION N/A 08/09/2019   Procedure: CORONARY STENT INTERVENTION;  Surgeon: Mady Bruckner, MD;  Location: [facility];  Service: Cardiovascular;  Laterality: N/A;   LEFT HEART CATH AND CORONARY ANGIOGRAPHY N/A 08/09/2019   Procedure: LEFT HEART CATH AND CORONARY ANGIOGRAPHY;  Surgeon: Mady Bruckner, MD;  Location: [facility];  Service: Cardiovascular;  Laterality: N/A;    OB History     Gravida  2   Para  2   Term      Preterm      AB      Living  2      SAB      IAB      Ectopic      Multiple      Live Births           Obstetric Comments  Menstrual age: 44  Age 1st Pregnancy: 20           Home Medications    Prior to Admission medications   Medication Sig Start Date End Date Taking? Authorizing Provider  cephALEXin  (KEFLEX ) 500 MG capsule Take 1 capsule (500 mg total) by mouth 2 (two) times daily for 7 days. 02/24/24 03/02/24 Yes Delina Kruczek A, FNP  acetaminophen  (TYLENOL ) 500 MG tablet Take 500-1,000 mg by mouth every 6 (six) hours as needed (  for pain.).    [provider]  alendronate  (FOSAMAX ) 70 MG tablet TAKE 1 TABLET BY MOUTH ONCE A WEEK. TAKE WITH A FULL GLASS OF WATER ON AN EMPTY STOMACH. 12/01/23   Jacobo Evalene PARAS, MD  aspirin  EC 81 MG tablet Take 1 tablet (81 mg total) by mouth daily. Swallow whole. 07/11/20   Darron Deatrice LABOR, MD  carvedilol  (COREG ) 25 MG tablet Take 1 tablet (25 mg total) by mouth 2 (two) times daily. 05/26/23   Darron Deatrice LABOR, MD  fenofibrate  (TRICOR ) 145 MG tablet TAKE 1 TABLET BY MOUTH EVERY DAY 12/17/23   Antonette Angeline ORN, NP  ferrous sulfate  325 (65 FE) MG tablet TAKE 1 TABLET BY MOUTH EVERY DAY 11/04/23   Antonette Angeline ORN, NP  letrozole  (FEMARA ) 2.5 MG tablet TAKE 1 TABLET BY MOUTH EVERY DAY 08/25/23   Jacobo Evalene PARAS, MD  losartan  (COZAAR ) 100 MG tablet TAKE 1 TABLET BY MOUTH EVERY DAY 08/25/23   Arida, Muhammad A, MD  mupirocin  ointment  (BACTROBAN ) 2 % Apply 1 Application topically 2 (two) times daily. 01/05/24   Ival Domino, FNP  Omega-3 Fatty Acids (FISH OIL) 500 MG CAPS Take 500 mg by mouth daily.    [provider]  [facility] ULTRA test strip USE TO CHECK BLOOD SUGAR ONCE A DAY. 12/02/23   Antonette Angeline ORN, NP  rosuvastatin  (CRESTOR ) 40 MG tablet TAKE 1 TABLET BY MOUTH EVERY DAY 10/09/23   Antonette Angeline ORN, NP  spironolactone  (ALDACTONE ) 25 MG tablet TAKE 1 TABLET (25 MG TOTAL) BY MOUTH DAILY. 08/25/23   Darron Deatrice LABOR, MD  tiZANidine  (ZANAFLEX ) 4 MG tablet Take 0.5 tablets (2 mg total) by mouth every 8 (eight) hours as needed for muscle spasms. 01/13/24   Antonette Angeline ORN, NP    Family History Family History  Problem Relation Age of Onset   Heart failure Mother    Diabetes Mother    Hypertension Mother    Congestive Heart Failure Brother    Hypertension Brother    Cerebral palsy Son    Congestive Heart Failure Brother    Breast cancer Sister 63   Thyroid  cancer Sister     Social History Social History   Tobacco Use   Smoking status: Never   Smokeless tobacco: Never  Vaping Use   Vaping status: Never Used  Substance Use Topics   Alcohol use: No    Alcohol/week: 0.0 standard drinks of alcohol   Drug use: No     Allergies   Patient has no known allergies.   Review of Systems Review of Systems  Genitourinary:  Positive for dysuria.     Physical Exam Triage Vital Signs ED Triage Vitals  Encounter Vitals Group     BP 02/24/24 1121 (!) 152/83     Girls Systolic BP Percentile --      Girls Diastolic BP Percentile --      Boys Systolic BP Percentile --      Boys Diastolic BP Percentile --      Pulse Rate 02/24/24 1121 78     Resp 02/24/24 1121 20     Temp 02/24/24 1121 97.9 F (36.6 C)     Temp Source 02/24/24 1121 Oral     SpO2 02/24/24 1121 97 %     Weight --      Height --      Head Circumference --      Peak Flow --      Pain Score 02/24/24 1119 8  Pain Loc --      Pain  Education --      Exclude from Growth Chart --    No data found.  Updated Vital Signs BP (!) 152/83 (BP Location: Right Arm)   Pulse 78   Temp 97.9 F (36.6 C) (Oral)   Resp 20   SpO2 97%   Visual Acuity Right Eye Distance:   Left Eye Distance:   Bilateral Distance:    Right Eye Near:   Left Eye Near:    Bilateral Near:     Physical Exam Vitals and nursing note reviewed.  Constitutional:      General: She is not in acute distress.    Appearance: Normal appearance. She is not ill-appearing, toxic-appearing or diaphoretic.  Pulmonary:     Effort: Pulmonary effort is normal.  Neurological:     Mental Status: She is alert.  Psychiatric:        Mood and Affect: Mood normal.      UC Treatments / Results  Labs (all labs ordered are listed, but only abnormal results are displayed) Labs Reviewed  POCT URINALYSIS DIP (MANUAL ENTRY) - Abnormal; Notable for the following components:      Result Value   Clarity, UA hazy (*)    Blood, UA trace-lysed (*)    Leukocytes, UA Small (1+) (*)    All other components within normal limits  URINE CULTURE    EKG   Radiology No results found.  Procedures Procedures (including critical care time)  Medications Ordered in UC Medications - No data to display  Initial Impression / Assessment and Plan / UC Course  I have reviewed the triage vital signs and the nursing notes.  Pertinent labs & imaging results that were available during my care of the patient were reviewed by me and considered in my medical decision making (see chart for details).     Dysuria-urine with small leuks, trace blood and hazy.  Will send for culture.  Will go ahead and treat for urinary tract infection at this time.  Treated with Keflex .  Culture pending.  Recommended drink plenty of fluids and follow-up as needed Final Clinical Impressions(s) / UC Diagnoses   Final diagnoses:  Dysuria     Discharge Instructions      Take the antibiotics for a  urinary tract infection.  Drink plenty of water.  We will call with any changes if needed once the culture comes back.     ED Prescriptions     Medication Sig Dispense Auth. Provider   cephALEXin  (KEFLEX ) 500 MG capsule Take 1 capsule (500 mg total) by mouth 2 (two) times daily for 7 days. 14 capsule Adah Corning A, FNP      PDMP not reviewed this encounter.   Adah Corning LABOR, FNP 02/24/24 1155

## 2024-02-24 NOTE — Discharge Instructions (Signed)
 Take the antibiotics for a urinary tract infection.  Drink plenty of water.  We will call with any changes if needed once the culture comes back.

## 2024-02-24 NOTE — ED Triage Notes (Signed)
 Pt c/o UTI symptoms for the last 4 days. She has been experiencing urinary frequency, dysuria, hesitancy. Denies hematuria. She has been drinking cranberry juice with no relief.

## 2024-02-26 ENCOUNTER — Ambulatory Visit (HOSPITAL_COMMUNITY): Payer: Self-pay

## 2024-02-26 LAB — URINE CULTURE: Culture: >=100000 — AB

## 2024-03-22 DIAGNOSIS — N1831 Chronic kidney disease, stage 3a: Secondary | ICD-10-CM | POA: Diagnosis not present

## 2024-03-26 LAB — COMPREHENSIVE METABOLIC PANEL WITH GFR: eGFR: 47

## 2024-03-26 LAB — CBC AND DIFFERENTIAL
HCT: 38 (ref 36–46)
Hemoglobin: 12.8 (ref 12.0–16.0)

## 2024-03-26 LAB — BASIC METABOLIC PANEL WITH GFR: Creatinine: 1.2 — AB (ref 0.5–1.1)

## 2024-03-30 DIAGNOSIS — N1831 Chronic kidney disease, stage 3a: Secondary | ICD-10-CM | POA: Diagnosis not present

## 2024-03-30 DIAGNOSIS — E559 Vitamin D deficiency, unspecified: Secondary | ICD-10-CM | POA: Diagnosis not present

## 2024-03-30 DIAGNOSIS — N390 Urinary tract infection, site not specified: Secondary | ICD-10-CM | POA: Diagnosis not present

## 2024-03-30 DIAGNOSIS — I129 Hypertensive chronic kidney disease with stage 1 through stage 4 chronic kidney disease, or unspecified chronic kidney disease: Secondary | ICD-10-CM | POA: Diagnosis not present

## 2024-03-30 DIAGNOSIS — I509 Heart failure, unspecified: Secondary | ICD-10-CM | POA: Diagnosis not present

## 2024-03-31 ENCOUNTER — Other Ambulatory Visit: Payer: Self-pay | Admitting: Internal Medicine

## 2024-03-31 ENCOUNTER — Telehealth (HOSPITAL_COMMUNITY): Payer: Self-pay

## 2024-03-31 NOTE — Telephone Encounter (Signed)
 Pt's chart was opened in error. Pt's SIL had called wrong office.

## 2024-04-01 NOTE — Telephone Encounter (Signed)
 Requested Prescriptions  Pending Prescriptions Disp Refills   rosuvastatin  (CRESTOR ) 40 MG tablet [Pharmacy Med Name: ROSUVASTATIN  CALCIUM  40 MG TAB] 90 tablet 1    Sig: TAKE 1 TABLET BY MOUTH EVERY DAY     Cardiovascular:  Antilipid - Statins 2 Failed - 04/01/2024  1:13 PM      Failed - Cr in normal range and within 360 days    Creat  Date Value Ref Range Status  10/10/2023 1.13 (H) 0.60 - 0.95 mg/dL Final   Creatinine, Urine  Date Value Ref Range Status  10/10/2023 84 20 - 275 mg/dL Final         Failed - Lipid Panel in normal range within the last 12 months    Cholesterol, Total  Date Value Ref Range Status  08/15/2015 274 (H) 100 - 199 mg/dL Final   Cholesterol  Date Value Ref Range Status  10/10/2023 132 <200 mg/dL Final   LDL Cholesterol (Calc)  Date Value Ref Range Status  10/10/2023 59 mg/dL (calc) Final    Comment:    Reference range: <100 . Desirable range <100 mg/dL for primary prevention;   <70 mg/dL for patients with CHD or diabetic patients  with > or = 2 CHD risk factors. SABRA LDL-C is now calculated using the Martin-Hopkins  calculation, which is a validated novel method providing  better accuracy than the Friedewald equation in the  estimation of LDL-C.  Gladis APPLETHWAITE et al. SANDREA. 7986;689(80): 2061-2068  (http://education.[facility].com/faq/FAQ164)    Direct LDL  Date Value Ref Range Status  07/01/2019 87.0 mg/dL Final    Comment:    Optimal:  <100 mg/dLNear or Above Optimal:  100-129 mg/dLBorderline High:  130-159 mg/dLHigh:  160-189 mg/dLVery High:  >190 mg/dL   HDL  Date Value Ref Range Status  10/10/2023 48 (L) > OR = 50 mg/dL Final  98/96/7982 38 (L) >39 mg/dL Final   Triglycerides  Date Value Ref Range Status  10/10/2023 176 (H) <150 mg/dL Final         Passed - Patient is not pregnant      Passed - Valid encounter within last 12 months    Recent Outpatient Visits           2 months ago Rib pain on left side   [place] [facility] [place], Angeline ORN, NP   5 months ago Type 2 diabetes mellitus with stage 3a chronic kidney disease, without long-term current use of insulin  [facility])   [place] [facility] [place], Angeline ORN, [place]

## 2024-04-08 ENCOUNTER — Ambulatory Visit: Payer: Medicare HMO | Admitting: Radiation Oncology

## 2024-04-13 ENCOUNTER — Encounter: Payer: Self-pay | Admitting: Internal Medicine

## 2024-04-13 ENCOUNTER — Ambulatory Visit: Payer: Medicare HMO | Admitting: Internal Medicine

## 2024-04-13 VITALS — BP 128/70 | HR 80 | Ht 63.0 in | Wt 156.8 lb

## 2024-04-13 DIAGNOSIS — E1169 Type 2 diabetes mellitus with other specified complication: Secondary | ICD-10-CM

## 2024-04-13 DIAGNOSIS — E663 Overweight: Secondary | ICD-10-CM | POA: Diagnosis not present

## 2024-04-13 DIAGNOSIS — E785 Hyperlipidemia, unspecified: Secondary | ICD-10-CM

## 2024-04-13 DIAGNOSIS — E1122 Type 2 diabetes mellitus with diabetic chronic kidney disease: Secondary | ICD-10-CM | POA: Diagnosis not present

## 2024-04-13 DIAGNOSIS — Z23 Encounter for immunization: Secondary | ICD-10-CM

## 2024-04-13 DIAGNOSIS — Z0001 Encounter for general adult medical examination with abnormal findings: Secondary | ICD-10-CM | POA: Diagnosis not present

## 2024-04-13 DIAGNOSIS — N1831 Chronic kidney disease, stage 3a: Secondary | ICD-10-CM

## 2024-04-13 DIAGNOSIS — Z6827 Body mass index (BMI) 27.0-27.9, adult: Secondary | ICD-10-CM

## 2024-04-13 MED ORDER — VITAMIN D (ERGOCALCIFEROL) 1.25 MG (50000 UNIT) PO CAPS: 50000.0000 [IU] | ORAL_CAPSULE | ORAL | 0 refills | Status: DC

## 2024-04-13 NOTE — Progress Notes (Signed)
 Subjective:    Patient ID: Janice Liu, female    DOB: August 01, 1941, 83 y.o.   MRN: [id number]  HPI  Patient presents to clinic today for her annual exam.  Flu: 03/2023 Tetanus: unsure COVID: X 7 Pneumovax: 03/2022 Prevnar: 03/2021 Shingrix: Never Pap smear: no longer screening Mammogram: 12/2022 Bone density: 04/2022 Colon screening: no longer screening Vision screening: as needed Dentist: as needed, dentures  Diet: She does eat meat. She consumes some fruits and veggies. She does eat fried foods. She drinks mostly coffee, water Exercise: None   Review of Systems     Past Medical History:  Diagnosis Date   Allergy    Breast cancer (HCC)    2021   CAD (coronary artery disease)    Chicken pox    Diabetes mellitus (HCC)    Heart murmur    HFrEF (heart failure with reduced ejection fraction) (HCC)    Hypertension    Personal history of radiation therapy     Current Outpatient Medications  Medication Sig Dispense Refill   acetaminophen  (TYLENOL ) 500 MG tablet Take 500-1,000 mg by mouth every 6 (six) hours as needed (for pain.).     alendronate  (FOSAMAX ) 70 MG tablet TAKE 1 TABLET BY MOUTH ONCE A WEEK. TAKE WITH A FULL GLASS OF WATER ON AN EMPTY STOMACH. 12 tablet 3   aspirin  EC 81 MG tablet Take 1 tablet (81 mg total) by mouth daily. Swallow whole.     carvedilol  (COREG ) 25 MG tablet Take 1 tablet (25 mg total) by mouth 2 (two) times daily. 180 tablet 3   fenofibrate  (TRICOR ) 145 MG tablet TAKE 1 TABLET BY MOUTH EVERY DAY 90 tablet 1   ferrous sulfate  325 (65 FE) MG tablet TAKE 1 TABLET BY MOUTH EVERY DAY 90 tablet 1   letrozole  (FEMARA ) 2.5 MG tablet TAKE 1 TABLET BY MOUTH EVERY DAY 90 tablet 3   losartan  (COZAAR ) 100 MG tablet TAKE 1 TABLET BY MOUTH EVERY DAY 90 tablet 2   mupirocin  ointment (BACTROBAN ) 2 % Apply 1 Application topically 2 (two) times daily. 22 g 0   Omega-3 Fatty Acids (FISH OIL) 500 MG CAPS Take 500 mg by mouth daily.     ONETOUCH ULTRA test strip  USE TO CHECK BLOOD SUGAR ONCE A DAY. 100 strip 1   rosuvastatin  (CRESTOR ) 40 MG tablet TAKE 1 TABLET BY MOUTH EVERY DAY 90 tablet 1   spironolactone  (ALDACTONE ) 25 MG tablet TAKE 1 TABLET (25 MG TOTAL) BY MOUTH DAILY. 90 tablet 2   tiZANidine  (ZANAFLEX ) 4 MG tablet Take 0.5 tablets (2 mg total) by mouth every 8 (eight) hours as needed for muscle spasms. 15 tablet 0   No current facility-administered medications for this visit.    No Known Allergies  Family History  Problem Relation Age of Onset   Heart failure Mother    Diabetes Mother    Hypertension Mother    Congestive Heart Failure Brother    Hypertension Brother    Cerebral palsy Son    Congestive Heart Failure Brother    Breast cancer Sister 53   Thyroid  cancer Sister     Social History   Socioeconomic History   Marital status: Widowed    Spouse name: Not on file   Number of children: 2   Years of education: Not on file   Highest education level: Not on file  Occupational History   Occupation: Retired  Tobacco Use   Smoking status: Never   Smokeless tobacco:  Never  Vaping Use   Vaping status: Never Used  Substance and Sexual Activity   Alcohol use: No    Alcohol/week: 0.0 standard drinks of alcohol   Drug use: No   Sexual activity: Never  Other Topics Concern   Not on file  Social History Narrative   Not on file   Social Drivers of Health   Financial Resource Strain: Low Risk  (05/30/2023)   Overall Financial Resource Strain (CARDIA)    Difficulty of Paying Living Expenses: Not hard at all  Food Insecurity: No Food Insecurity (05/30/2023)   Hunger Vital Sign    Worried About Running Out of Food in the Last Year: Never true    Ran Out of Food in the Last Year: Never true  Transportation Needs: No Transportation Needs (05/30/2023)   PRAPARE - Administrator, Civil Service (Medical): No    Lack of Transportation (Non-Medical): No  Physical Activity: Insufficiently Active (05/30/2023)    Exercise Vital Sign    Days of Exercise per Week: 3 days    Minutes of Exercise per Session: 30 min  Stress: No Stress Concern Present (05/30/2023)   [employer] of Occupational Health - Occupational Stress Questionnaire    Feeling of Stress : Not at all  Social Connections: Socially Isolated (05/30/2023)   Social Connection and Isolation Panel    Frequency of Communication with Friends and Family: More than three times a week    Frequency of Social Gatherings with Friends and Family: More than three times a week    Attends Religious Services: Never    Database administrator or Organizations: No    Attends Banker Meetings: Never    Marital Status: Widowed  Intimate Partner Violence: Not At Risk (05/30/2023)   Humiliation, Afraid, Rape, and Kick questionnaire    Fear of Current or Ex-Partner: No    Emotionally Abused: No    Physically Abused: No    Sexually Abused: No     Constitutional: Denies fever, malaise, fatigue, headache or abrupt weight changes.  HEENT: Denies eye pain, eye redness, ear pain, ringing in the ears, wax buildup, runny nose, nasal congestion, bloody nose, or sore throat. Respiratory: Denies difficulty breathing, shortness of breath, cough or sputum production.   Cardiovascular: Denies chest pain, chest tightness, palpitations or swelling in the hands or feet.  Gastrointestinal: Denies abdominal pain, bloating, constipation, diarrhea or blood in the stool.  GU: Denies urgency, frequency, pain with urination, burning sensation, blood in urine, odor or discharge. Musculoskeletal: Denies decrease in range of motion, difficulty with gait, muscle pain or joint pain and swelling.  Skin: Denies redness, rashes, lesions or ulcercations.  Neurological: Denies dizziness, difficulty with memory, difficulty with speech or problems with balance and coordination.  Psych: Denies anxiety, depression, SI/HI.  No other specific complaints in a complete review of  systems (except as listed in HPI above).  Objective:   Physical Exam BP (!) 144/72 (BP Location: Left Arm, Patient Position: Sitting, Cuff Size: Normal)   Pulse 80   Ht 5' 3 (1.6 m)   Wt 156 lb 12.8 oz (71.1 kg)   SpO2 99%   BMI 27.78 kg/m    Wt Readings from Last 3 Encounters:  01/13/24 157 lb 6 oz (71.4 kg)  01/13/24 158 lb 3.2 oz (71.8 kg)  11/24/23 155 lb 9.6 oz (70.6 kg)    General: Appears her stated age, overweight, in NAD. Skin: Warm, dry and intact. No ulcerations noted. HEENT:  Head: normal shape and size; Eyes: sclera white, no icterus, conjunctiva pink, PERRLA and EOMs intact;  Neck:  Neck supple, trachea midline. No masses, lumps or thyromegaly present.  Cardiovascular: Normal rate and rhythm. S1,S2 noted.  No murmur, rubs or gallops noted. No JVD or BLE edema. No carotid bruits noted. Pulmonary/Chest: Normal effort and positive vesicular breath sounds. No respiratory distress. No wheezes, rales or ronchi noted.  Abdomen:  Normal bowel sounds. Musculoskeletal: Kyphotic.  Strength 5/5 BUE/BLE.  No difficulty with gait.  Neurological: Alert and oriented. Cranial nerves II-XII grossly intact. Coordination normal.  Psychiatric: Mood and affect normal. Behavior is normal. Judgment and thought content normal.   BMET    Component Value Date/Time   NA 139 10/10/2023 1408   NA 148 (H) 07/10/2021 0938   K 4.6 10/10/2023 1408   CL 104 10/10/2023 1408   CO2 26 10/10/2023 1408   GLUCOSE 107 (H) 10/10/2023 1408   BUN 25 10/10/2023 1408   BUN 20 07/10/2021 0938   CREATININE 1.13 (H) 10/10/2023 1408   CALCIUM  9.7 10/10/2023 1408   GFRNONAA 49 (L) 12/08/2019 1113   GFRAA 57 (L) 12/08/2019 1113    Lipid Panel     Component Value Date/Time   CHOL 132 10/10/2023 1408   CHOL 274 (H) 08/15/2015 0803   TRIG 176 (H) 10/10/2023 1408   HDL 48 (L) 10/10/2023 1408   HDL 38 (L) 08/15/2015 0803   CHOLHDL 2.8 10/10/2023 1408   VLDL 31.4 03/09/2020 0949   LDLCALC 59 10/10/2023  1408    CBC    Component Value Date/Time   WBC 9.0 10/10/2023 1408   RBC 4.22 10/10/2023 1408   HGB 12.6 10/10/2023 1408   HGB 14.5 10/20/2020 1521   HCT 38.2 10/10/2023 1408   HCT 43.4 10/20/2020 1521   PLT 427 (H) 10/10/2023 1408   PLT 438 10/20/2020 1521   MCV 90.5 10/10/2023 1408   MCV 90 10/20/2020 1521   MCH 29.9 10/10/2023 1408   MCHC 33.0 10/10/2023 1408   RDW 12.4 10/10/2023 1408   RDW 11.9 10/20/2020 1521   LYMPHSABS 2.5 10/20/2020 1521   MONOABS 0.6 12/08/2019 1113   EOSABS 0.2 10/20/2020 1521   BASOSABS 0.0 10/20/2020 1521    Hgb A1C Lab Results  Component Value Date   HGBA1C 7.1 (H) 10/10/2023            Assessment & Plan:  Preventative health maintenance:  Flu shot today She declines tetanus for financial reasons, advised her if she gets bit or cut to go get this done COVID-vaccine UTD Pneumovax and Prevnar UTD Discussed Shingrix vaccine, she will check coverage with her insurance company and schedule a visit if she would like to have this done She no longer needs to screen for cervical cancer Mammogram ordered-she will call to schedule Bone density oredered- she will call to schedule She no longer needs to screen for colon cancer Encouraged her to consume a balanced diet and exercise regimen Advised her to see an eye doctor and dentist annually Will check HFP, lipid, A1c today  RTC in 6 months, follow-up chronic conditions Angeline Laura, NP

## 2024-04-13 NOTE — Assessment & Plan Note (Signed)
 Encouraged diet and exercise for weight loss ?

## 2024-04-13 NOTE — Patient Instructions (Signed)
 Health Maintenance for Postmenopausal Women Menopause is a normal process in which your ability to get pregnant comes to an end. This process happens slowly over many months or years, usually between the ages of 76 and 38. Menopause is complete when you have missed your menstrual period for 12 months. It is important to talk with your health care provider about some of the most common conditions that affect women after menopause (postmenopausal women). These include heart disease, cancer, and bone loss (osteoporosis). Adopting a healthy lifestyle and getting preventive care can help to promote your health and wellness. The actions you take can also lower your chances of developing some of these common conditions. What are the signs and symptoms of menopause? During menopause, you may have the following symptoms: Hot flashes. These can be moderate or severe. Night sweats. Decrease in sex drive. Mood swings. Headaches. Tiredness (fatigue). Irritability. Memory problems. Problems falling asleep or staying asleep. Talk with your health care provider about treatment options for your symptoms. Do I need hormone replacement therapy? Hormone replacement therapy is effective in treating symptoms that are caused by menopause, such as hot flashes and night sweats. Hormone replacement carries certain risks, especially as you become older. If you are thinking about using estrogen or estrogen with progestin, discuss the benefits and risks with your health care provider. How can I reduce my risk for heart disease and stroke? The risk of heart disease, heart attack, and stroke increases as you age. One of the causes may be a change in the body's hormones during menopause. This can affect how your body uses dietary fats, triglycerides, and cholesterol. Heart attack and stroke are medical emergencies. There are many things that you can do to help prevent heart disease and stroke. Watch your blood pressure High  blood pressure causes heart disease and increases the risk of stroke. This is more likely to develop in people who have high blood pressure readings or are overweight. Have your blood pressure checked: Every 3-5 years if you are 32-23 years of age. Every year if you are 31 years old or older. Eat a healthy diet  Eat a diet that includes plenty of vegetables, fruits, low-fat dairy products, and lean protein. Do not eat a lot of foods that are high in solid fats, added sugars, or sodium. Get regular exercise Get regular exercise. This is one of the most important things you can do for your health. Most adults should: Try to exercise for at least 150 minutes each week. The exercise should increase your heart rate and make you sweat (moderate-intensity exercise). Try to do strengthening exercises at least twice each week. Do these in addition to the moderate-intensity exercise. Spend less time sitting. Even light physical activity can be beneficial. Other tips Work with your health care provider to achieve or maintain a healthy weight. Do not use any products that contain nicotine or tobacco. These products include cigarettes, chewing tobacco, and vaping devices, such as e-cigarettes. If you need help quitting, ask your health care provider. Know your numbers. Ask your health care provider to check your cholesterol and your blood sugar (glucose). Continue to have your blood tested as directed by your health care provider. Do I need screening for cancer? Depending on your health history and family history, you may need to have cancer screenings at different stages of your life. This may include screening for: Breast cancer. Cervical cancer. Lung cancer. Colorectal cancer. What is my risk for osteoporosis? After menopause, you may be  at increased risk for osteoporosis. Osteoporosis is a condition in which bone destruction happens more quickly than new bone creation. To help prevent osteoporosis or  the bone fractures that can happen because of osteoporosis, you may take the following actions: If you are 24-54 years old, get at least 1,000 mg of calcium and at least 600 international units (IU) of vitamin D  per day. If you are older than age 75 but younger than age 30, get at least 1,200 mg of calcium and at least 600 international units (IU) of vitamin D  per day. If you are older than age 8, get at least 1,200 mg of calcium and at least 800 international units (IU) of vitamin D  per day. Smoking and drinking excessive alcohol increase the risk of osteoporosis. Eat foods that are rich in calcium and vitamin D , and do weight-bearing exercises several times each week as directed by your health care provider. How does menopause affect my mental health? Depression may occur at any age, but it is more common as you become older. Common symptoms of depression include: Feeling depressed. Changes in sleep patterns. Changes in appetite or eating patterns. Feeling an overall lack of motivation or enjoyment of activities that you previously enjoyed. Frequent crying spells. Talk with your health care provider if you think that you are experiencing any of these symptoms. General instructions See your health care provider for regular wellness exams and vaccines. This may include: Scheduling regular health, dental, and eye exams. Getting and maintaining your vaccines. These include: Influenza vaccine. Get this vaccine each year before the flu season begins. Pneumonia vaccine. Shingles vaccine. Tetanus, diphtheria, and pertussis (Tdap) booster vaccine. Your health care provider may also recommend other immunizations. Tell your health care provider if you have ever been abused or do not feel safe at home. Summary Menopause is a normal process in which your ability to get pregnant comes to an end. This condition causes hot flashes, night sweats, decreased interest in sex, mood swings, headaches, or lack  of sleep. Treatment for this condition may include hormone replacement therapy. Take actions to keep yourself healthy, including exercising regularly, eating a healthy diet, watching your weight, and checking your blood pressure and blood sugar levels. Get screened for cancer and depression. Make sure that you are up to date with all your vaccines. This information is not intended to replace advice given to you by your health care provider. Make sure you discuss any questions you have with your health care provider. Document Revised: 12/18/2020 Document Reviewed: 12/18/2020 Elsevier Patient Education  2024 [employer].

## 2024-04-14 ENCOUNTER — Ambulatory Visit: Payer: Self-pay | Admitting: Internal Medicine

## 2024-04-14 DIAGNOSIS — N1831 Chronic kidney disease, stage 3a: Secondary | ICD-10-CM

## 2024-04-14 LAB — HEMOGLOBIN A1C
Hgb A1c MFr Bld: 6.9 % — ABNORMAL HIGH (ref <5.7)
Mean Plasma Glucose: 151 mg/dL
eAG (mmol/L): 8.4 mmol/L

## 2024-04-14 LAB — HEPATIC FUNCTION PANEL
AG Ratio: 1.8 (calc) (ref 1.0–2.5)
ALT: 13 U/L (ref 6–29)
AST: 17 U/L (ref 10–35)
Albumin: 4.4 g/dL (ref 3.6–5.1)
Alkaline phosphatase (APISO): 40 U/L (ref 37–153)
Bilirubin, Direct: 0.1 mg/dL (ref 0.0–0.2)
Globulin: 2.4 g/dL (ref 1.9–3.7)
Indirect Bilirubin: 0.2 mg/dL (ref 0.2–1.2)
Total Bilirubin: 0.3 mg/dL (ref 0.2–1.2)
Total Protein: 6.8 g/dL (ref 6.1–8.1)

## 2024-04-14 LAB — LIPID PANEL
Cholesterol: 115 mg/dL (ref <200)
HDL: 46 mg/dL — ABNORMAL LOW (ref >=50)
LDL Cholesterol (Calc): 45 mg/dL
Non-HDL Cholesterol (Calc): 69 mg/dL (ref <130)
Total CHOL/HDL Ratio: 2.5 (calc) (ref <5.0)
Triglycerides: 160 mg/dL — ABNORMAL HIGH (ref <150)

## 2024-04-15 MED ORDER — EMPAGLIFLOZIN 10 MG PO TABS: 10.0000 mg | ORAL_TABLET | Freq: Every day | ORAL | 1 refills | Status: DC

## 2024-04-16 ENCOUNTER — Telehealth: Payer: Self-pay

## 2024-04-16 MED ORDER — SITAGLIPTIN PHOSPHATE 25 MG PO TABS: 25.0000 mg | ORAL_TABLET | Freq: Every day | ORAL | 1 refills | Status: AC

## 2024-04-16 NOTE — Telephone Encounter (Signed)
 Januvia  sent to pharmacy in place of jardience

## 2024-04-16 NOTE — Telephone Encounter (Signed)
 Copied from CRM [id number]. Topic: Clinical - Prescription Issue >> Apr 16, 2024  9:13 AM Leonette SQUIBB wrote: Reason for CRM: patients son called saying the Jardiance  after insurance is still 400.00  She can not afford that and will need a new medication.

## 2024-04-16 NOTE — Addendum Note (Signed)
 Addended by: ANTONETTE ANGELINE ORN on: 04/16/2024 10:26 AM   Modules accepted: Orders

## 2024-04-16 NOTE — Telephone Encounter (Addendum)
 Left message for patient to return call OK  to advise    Spoke to son Charolotte) notified Januvia  prescription has been sent into pharmacy

## 2024-04-19 ENCOUNTER — Encounter: Payer: Self-pay | Admitting: Licensed Clinical Social Worker

## 2024-04-19 ENCOUNTER — Ambulatory Visit
Admission: RE | Admit: 2024-04-19 | Discharge: 2024-04-19 | Disposition: A | Discharge status: Home / Self Care | Source: Ambulatory Visit | Attending: Oncology | Admitting: Oncology

## 2024-04-19 DIAGNOSIS — Z5181 Encounter for therapeutic drug level monitoring: Secondary | ICD-10-CM | POA: Insufficient documentation

## 2024-04-19 DIAGNOSIS — C50511 Malignant neoplasm of lower-outer quadrant of right female breast: Secondary | ICD-10-CM | POA: Insufficient documentation

## 2024-04-19 DIAGNOSIS — Z79811 Long term (current) use of aromatase inhibitors: Secondary | ICD-10-CM | POA: Insufficient documentation

## 2024-04-19 DIAGNOSIS — Z1231 Encounter for screening mammogram for malignant neoplasm of breast: Secondary | ICD-10-CM | POA: Diagnosis not present

## 2024-04-19 DIAGNOSIS — M810 Age-related osteoporosis without current pathological fracture: Secondary | ICD-10-CM | POA: Insufficient documentation

## 2024-04-19 DIAGNOSIS — Z13820 Encounter for screening for osteoporosis: Secondary | ICD-10-CM | POA: Diagnosis not present

## 2024-04-19 DIAGNOSIS — Z853 Personal history of malignant neoplasm of breast: Secondary | ICD-10-CM | POA: Diagnosis not present

## 2024-04-19 NOTE — Patient Outreach (Signed)
 LCSW received a message from [facility] that pt called in office requesting a call back from LCSW. A return call was made and a HIPAA compliant voice message was left, requesting a call back.

## 2024-04-21 ENCOUNTER — Other Ambulatory Visit: Payer: Self-pay | Admitting: Internal Medicine

## 2024-04-22 NOTE — Telephone Encounter (Signed)
 Requested medication (s) are due for refill today: yes  Requested medication (s) are on the active medication list: yes  Last refill:  11/04/23 #90 1 RF  Future visit scheduled: yes  Notes to clinic:  overdue lab work    Requested Prescriptions  Pending Prescriptions Disp Refills   ferrous sulfate  325 (65 FE) MG tablet [Pharmacy Med Name: FERROUS SULFATE  325 MG TABLET] 90 tablet 1    Sig: TAKE 1 TABLET BY MOUTH EVERY DAY     Endocrinology:  Minerals - Iron  Supplementation Failed - 04/22/2024 11:18 AM      Failed - Fe (serum) in normal range and within 360 days    Iron   Date Value Ref Range Status  10/20/2020 74 27 - 139 ug/dL Final   Iron  Saturation  Date Value Ref Range Status  10/20/2020 20 15 - 55 % Final         Failed - Ferritin in normal range and within 360 days    Ferritin  Date Value Ref Range Status  10/20/2020 147 15 - 150 ng/mL Final         Passed - HGB in normal range and within 360 days    Hemoglobin  Date Value Ref Range Status  03/26/2024 12.8 12.0 - 16.0 Final  10/20/2020 14.5 11.1 - 15.9 g/dL Final         Passed - HCT in normal range and within 360 days    HCT  Date Value Ref Range Status  03/26/2024 38 36 - 46 Final   Hematocrit  Date Value Ref Range Status  10/20/2020 43.4 34.0 - 46.6 % Final         Passed - RBC in normal range and within 360 days    RBC  Date Value Ref Range Status  10/10/2023 4.22 3.80 - 5.10 Million/uL Final         Passed - Valid encounter within last 12 months    Recent Outpatient Visits           1 week ago Encounter for general adult medical examination with abnormal findings   [facility] [place], [place] W, NP   3 months ago Rib pain on left side   [facility] [place], [place] W, NP   6 months ago Type 2 diabetes mellitus with stage 3a chronic kidney disease, without long-term current use of insulin  [facility])   [facility] [place],  Angeline ORN, [place]

## 2024-04-26 ENCOUNTER — Encounter: Payer: Self-pay | Admitting: Radiation Oncology

## 2024-04-26 ENCOUNTER — Encounter: Payer: Self-pay | Admitting: Oncology

## 2024-04-26 ENCOUNTER — Ambulatory Visit
Admission: RE | Admit: 2024-04-26 | Discharge: 2024-04-26 | Disposition: A | Discharge status: Home / Self Care | Source: Ambulatory Visit | Attending: Radiation Oncology | Admitting: Radiation Oncology

## 2024-04-26 ENCOUNTER — Inpatient Hospital Stay: Attending: Oncology | Admitting: Oncology

## 2024-04-26 VITALS — BP 132/77 | HR 90 | Temp 97.1°F | Resp 16 | Wt 163.0 lb

## 2024-04-26 VITALS — BP 132/77 | HR 90 | Temp 97.1°F | Resp 16 | Wt 156.0 lb

## 2024-04-26 DIAGNOSIS — Z803 Family history of malignant neoplasm of breast: Secondary | ICD-10-CM | POA: Insufficient documentation

## 2024-04-26 DIAGNOSIS — Z808 Family history of malignant neoplasm of other organs or systems: Secondary | ICD-10-CM | POA: Insufficient documentation

## 2024-04-26 DIAGNOSIS — Z79811 Long term (current) use of aromatase inhibitors: Secondary | ICD-10-CM | POA: Insufficient documentation

## 2024-04-26 DIAGNOSIS — N289 Disorder of kidney and ureter, unspecified: Secondary | ICD-10-CM | POA: Insufficient documentation

## 2024-04-26 DIAGNOSIS — Z170 Estrogen receptor positive status [ER+]: Secondary | ICD-10-CM | POA: Insufficient documentation

## 2024-04-26 DIAGNOSIS — Z923 Personal history of irradiation: Secondary | ICD-10-CM | POA: Insufficient documentation

## 2024-04-26 DIAGNOSIS — Z79899 Other long term (current) drug therapy: Secondary | ICD-10-CM | POA: Insufficient documentation

## 2024-04-26 DIAGNOSIS — C50511 Malignant neoplasm of lower-outer quadrant of right female breast: Secondary | ICD-10-CM | POA: Diagnosis not present

## 2024-04-26 DIAGNOSIS — Z1721 Progesterone receptor positive status: Secondary | ICD-10-CM | POA: Diagnosis not present

## 2024-04-26 DIAGNOSIS — M810 Age-related osteoporosis without current pathological fracture: Secondary | ICD-10-CM | POA: Insufficient documentation

## 2024-04-26 DIAGNOSIS — Z1732 Human epidermal growth factor receptor 2 negative status: Secondary | ICD-10-CM | POA: Insufficient documentation

## 2024-04-26 NOTE — Progress Notes (Signed)
 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  INTERVAL HISTORY: Patient returns to clinic today for routine 31-month evaluation and discussion of her mammogram and bone mineral density results.  She currently feels well and is asymptomatic.  She is tolerating letrozole  and Fosamax  without significant side effects.  She has no neurologic complaints.  She denies any recent fevers or illnesses.  She has a good appetite and denies weight loss. She has no chest pain, shortness of breath, cough, or hemoptysis.  She denies any nausea, vomiting, constipation, or diarrhea.  She has no melena or hematochezia.  She has no urinary complaints.  Patient offers no specific complaints today.  REVIEW OF SYSTEMS:   Review of Systems  Constitutional: Negative.  Negative for fever, malaise/fatigue and weight loss.  Respiratory: Negative.  Negative for cough and shortness of breath.   Cardiovascular: Negative.  Negative for chest pain and leg swelling.  Gastrointestinal: Negative.  Negative for abdominal pain, blood in stool and melena.  Genitourinary: Negative.  Negative for hematuria.  Musculoskeletal: Negative.  Negative for back pain.  Skin: Negative.  Negative for rash.  Neurological: Negative.  Negative for dizziness, focal weakness, weakness and headaches.  Psychiatric/Behavioral: Negative.  The patient is not nervous/anxious.      As per HPI. Otherwise, a complete review of systems is negative.  PAST MEDICAL HISTORY: Past Medical History:  Diagnosis Date   Allergy    Breast cancer (HCC)    2021   CAD (coronary artery disease)    Chicken pox    Diabetes mellitus (HCC)    Heart murmur    HFrEF (heart failure with reduced ejection fraction) (HCC)    Hypertension    Personal history of radiation therapy     PAST SURGICAL HISTORY: Past Surgical History:  Procedure Laterality Date   BREAST BIOPSY Right 11/04/2019   Affirm Bx- positive- Ribbon Clip   BREAST BIOPSY Left 11/04/2019   Affirm Bx- neg- Coil Clip   BREAST BIOPSY Left 11/12/2019   us  bx/ ribbon clip/ path pending   BREAST CYST ASPIRATION Left 11/12/2019   2 areas   BREAST LUMPECTOMY Right 12/10/2019   Procedure: BREAST LUMPECTOMY;  Surgeon: Lane Shope, MD;  Location: [facility] ORS;  Service: General;  Laterality: Right;   CARDIAC CATHETERIZATION     CORONARY STENT INTERVENTION N/A 08/09/2019   Procedure: CORONARY STENT INTERVENTION;  Surgeon: Mady Bruckner, MD;  Location: [facility] INVASIVE CV LAB;  Service: Cardiovascular;  Laterality: N/A;   LEFT HEART CATH AND CORONARY ANGIOGRAPHY N/A 08/09/2019   Procedure: LEFT HEART CATH AND CORONARY ANGIOGRAPHY;  Surgeon: Mady Bruckner, MD;  Location: [facility] INVASIVE CV LAB;  Service: Cardiovascular;  Laterality: N/A;    FAMILY HISTORY: Family History  Problem Relation Age of Onset   Heart failure Mother    Diabetes Mother    Hypertension Mother    Congestive Heart Failure Brother    Hypertension Brother    Cerebral palsy Son    Congestive Heart Failure Brother  Breast cancer Sister 47   Thyroid  cancer Sister     ADVANCED DIRECTIVES (Y/N):  N  HEALTH MAINTENANCE: Social History   Tobacco Use   Smoking status: Never   Smokeless tobacco: Never  Vaping Use   Vaping status: Never Used  Substance Use Topics   Alcohol use: No    Alcohol/week: 0.0 standard drinks of alcohol   Drug use:  No     Colonoscopy:  PAP:  Bone density:  Lipid panel:  No Known Allergies  Current Outpatient Medications  Medication Sig Dispense Refill   acetaminophen  (TYLENOL ) 500 MG tablet Take 500-1,000 mg by mouth every 6 (six) hours as needed (for pain.).     alendronate  (FOSAMAX ) 70 MG tablet TAKE 1 TABLET BY MOUTH ONCE A WEEK. TAKE WITH A FULL GLASS OF WATER ON AN EMPTY STOMACH. 12 tablet 3   aspirin  EC 81 MG tablet Take 1 tablet (81 mg total) by mouth daily. Swallow whole.     carvedilol  (COREG ) 25 MG tablet Take 1 tablet (25 mg total) by mouth 2 (two) times daily. 180 tablet 3   Cephalexin  500 MG tablet Take 500 mg by mouth 2 (two) times daily.     fenofibrate  (TRICOR ) 145 MG tablet TAKE 1 TABLET BY MOUTH EVERY DAY 90 tablet 1   ferrous sulfate  325 (65 FE) MG tablet TAKE 1 TABLET BY MOUTH EVERY DAY 90 tablet 1   letrozole  (FEMARA ) 2.5 MG tablet TAKE 1 TABLET BY MOUTH EVERY DAY 90 tablet 3   losartan  (COZAAR ) 100 MG tablet TAKE 1 TABLET BY MOUTH EVERY DAY 90 tablet 2   mupirocin  ointment (BACTROBAN ) 2 % Apply 1 Application topically 2 (two) times daily. 22 g 0   Omega-3 Fatty Acids (FISH OIL) 500 MG CAPS Take 500 mg by mouth daily.     ONETOUCH ULTRA test strip USE TO CHECK BLOOD SUGAR ONCE A DAY. 100 strip 1   rosuvastatin  (CRESTOR ) 40 MG tablet TAKE 1 TABLET BY MOUTH EVERY DAY 90 tablet 1   sitaGLIPtin  (JANUVIA ) 25 MG tablet Take 1 tablet (25 mg total) by mouth daily. 90 tablet 1   spironolactone  (ALDACTONE ) 25 MG tablet TAKE 1 TABLET (25 MG TOTAL) BY MOUTH DAILY. 90 tablet 2   tiZANidine  (ZANAFLEX ) 4 MG tablet Take 0.5 tablets (2 mg total) by mouth every 8 (eight) hours as needed for muscle spasms. 15 tablet 0   Vitamin D , Ergocalciferol , (DRISDOL ) 1.25 MG (50000 UNIT) CAPS capsule Take 1 capsule (50,000 Units total) by mouth once a week. For 12 weeks. Then start OTC Vitamin D3 2,000 unit daily. 12 capsule 0   No current facility-administered medications for this visit.     OBJECTIVE: Vitals:   04/26/24 1415  BP: 132/77  Pulse: 90  Resp: 16  Temp: (!) 97.1 F (36.2 C)  SpO2: 100%     Body mass index is 27.63 kg/m.    ECOG FS:0 - Asymptomatic  General: Well-developed, well-nourished, no acute distress. Eyes: Pink conjunctiva, anicteric sclera. HEENT: Normocephalic, moist mucous membranes. Lungs: No audible wheezing or coughing. Heart: Regular rate and rhythm. Abdomen: Soft, nontender, no obvious distention. Musculoskeletal: No edema, cyanosis, or clubbing. Neuro: Alert, answering all questions appropriately. Cranial nerves grossly intact. Skin: No rashes or petechiae noted. Psych: Normal affect.  LAB RESULTS:  Lab Results  Component Value Date   NA 139 10/10/2023   K 4.6 10/10/2023   CL 104 10/10/2023   CO2 26 10/10/2023   GLUCOSE 107 (H) 10/10/2023  BUN 25 10/10/2023   CREATININE 1.2 (A) 03/26/2024   CALCIUM  9.7 10/10/2023   PROT 6.8 04/13/2024   ALBUMIN 4.5 03/09/2020   AST 17 04/13/2024   ALT 13 04/13/2024   ALKPHOS 63 03/09/2020   BILITOT 0.3 04/13/2024   GFRNONAA 49 (L) 12/08/2019   GFRAA 57 (L) 12/08/2019    Lab Results  Component Value Date   WBC 9.0 10/10/2023   NEUTROABS 5.8 10/20/2020   HGB 12.8 03/26/2024   HCT 38 03/26/2024   MCV 90.5 10/10/2023   PLT 427 (H) 10/10/2023   Lab Results  Component Value Date   IRON  74 10/20/2020   TIBC 363 10/20/2020   IRONPCTSAT 20 10/20/2020   Lab Results  Component Value Date   FERRITIN 147 10/20/2020     STUDIES: MM 3D SCREENING MAMMOGRAM BILATERAL BREAST Result Date: 04/22/2024 CLINICAL DATA:  Screening. Personal history of malignant RIGHT breast lumpectomy in 2021. EXAM: DIGITAL SCREENING BILATERAL MAMMOGRAM WITH TOMOSYNTHESIS AND CAD TECHNIQUE: Bilateral screening digital craniocaudal and mediolateral oblique mammograms were obtained. Bilateral screening digital breast tomosynthesis was performed. The images were evaluated with computer-aided detection.  COMPARISON:  Previous exam(s). ACR Breast Density Category b: There are scattered areas of fibroglandular density. FINDINGS: There are no findings suspicious for malignancy. Expected post lumpectomy changes involving the RIGHT breast. IMPRESSION: No mammographic evidence of malignancy. A result letter of this screening mammogram will be mailed directly to the patient. RECOMMENDATION: Screening mammogram in one year. (Code:SM-B-01Y) BI-RADS CATEGORY  2: Benign. Electronically Signed   By: Debby Satterfield M.D.   On: 04/22/2024 14:18   DG Bone Density Result Date: 04/19/2024 EXAM: DUAL X-RAY ABSORPTIOMETRY (DXA) FOR BONE MINERAL DENSITY 04/19/2024 1:24 pm CLINICAL DATA:  83 year old Female Aromatase inhibitor use Patient is or has been on bone building therapies. TECHNIQUE: An axial (e.g., hips, spine) and/or appendicular (e.g., radius) exam was performed, as appropriate, using GE Secretary/administrator at [facility]. Images are obtained for bone mineral density measurement and are not obtained for diagnostic purposes. [id number] Exclusions: None. COMPARISON:  04/17/2022. FINDINGS: Scan quality: Good. LUMBAR SPINE (L1-L4): BMD (in g/cm2): 0.919 T-score: -2.2 Z-score: -0.3 Rate of change from previous exam: No significant rate of change from previous exam. LEFT FEMORAL NECK: BMD (in g/cm2): 0.671 T-score: -2.6 Z-score: -0.3 LEFT TOTAL HIP: BMD (in g/cm2): 0.690 T-score: -2.5 Z-score: -0.3 RIGHT FEMORAL NECK: BMD (in g/cm2): 0.612 T-score: -3.1 Z-score: -0.8 RIGHT TOTAL HIP: BMD (in g/cm2): 0.657 T-score: -2.8 Z-score: -0.6 DUAL-FEMUR TOTAL MEAN: Rate of change from previous exam: No significant rate of change from previous exam. FRAX 10-YEAR PROBABILITY OF FRACTURE: FRAX not reported as the lowest BMD is not in the osteopenia range. IMPRESSION: Osteoporosis based on BMD. Fracture risk is unknown due to history of bone building therapy. RECOMMENDATIONS: 1. All patients should optimize calcium  and  vitamin D  intake. 2. Consider FDA-approved medical therapies in postmenopausal women and men aged 53 years and older, based on the following: - A hip or vertebral (clinical or morphometric) fracture - T-score less than or equal to -2.5 and secondary causes have been excluded. - Low bone mass (T-score between -1.0 and -2.5) and a 10-year probability of a hip fracture greater than or equal to 3% or a 10-year probability of a major osteoporosis-related fracture greater than or equal to 20% based on the [place] -adapted WHO algorithm. - Clinician judgment and/or patient preferences may indicate treatment for people with 10-year fracture probabilities above or below these levels 3.  Patients with diagnosis of osteoporosis or at high risk for fracture should have regular bone mineral density tests. For patients eligible for Medicare, routine testing is allowed once every 2 years. The testing frequency can be increased to one year for patients who have rapidly progressing disease, those who are receiving or discontinuing medical therapy to restore bone mass, or have additional risk factors. Electronically Signed   By: Reyes Phi M.D.   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  Given the fact that she is grade 1 and mucinous, Oncotype DX was not necessary.  Patient completed adjuvant XRT in July 2021.  Continue letrozole  for a total of 5 years completing treatment in July 2026.  Her most recent mammogram on April 19, 2024 was reported as BI-RADS 1.  Repeat in September 2026.  Return to clinic in 6 months for routine evaluation.  Osteoporosis: Chronic and unchanged.  Patient's most recent bone  mineral density on April 19, 2024 was reported as unchanged from previous.  Continue Fosamax , calcium , and vitamin D .  Follow-up as above. Renal insufficiency: Continue follow-up with nephrology as scheduled.   Patient expressed understanding and was in agreement with this plan. She also understands that She can call clinic at any time with any questions, concerns, or complaints.    Cancer Staging  History of breast cancer Staging form: Breast, AJCC 8th Edition - Clinical stage from 01/07/2020: Stage IB (cT2, cN0, cM0, G1, ER+, PR+, HER2-) - Signed by Jacobo Evalene PARAS, MD on 01/07/2020 Stage prefix: Initial diagnosis Histologic grading system: 3 grade system   Evalene PARAS Jacobo, MD   04/26/2024 2:24 PM

## 2024-04-26 NOTE — Progress Notes (Signed)
 Radiation Oncology Follow up Note  Name: Janice Liu   Date:   04/26/2024 MRN:  [id number] DOB: October 05, 1940    This 83 y.o. 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  ER positive  REFERRING PROVIDER: Antonette Angeline ORN, NP  HPI: Patient is a 83 year old female now out over 4 years having completed whole breast radiation to her right breast for stage IIa invasive mammary carcinoma.  Seen today in routine follow-up.  She is doing well.  She specifically denies breast tenderness cough or bone pain.  She is currently on letrozole  tolerating that well without side effect.  Her mammograms this month which I have reviewed were BI-RADS 2 benign.  She also had films for rib pain back in June which were negative for fracture.  COMPLICATIONS OF TREATMENT: none  FOLLOW UP COMPLIANCE: keeps appointments   PHYSICAL EXAM:  BP 132/77   Pulse 90   Temp (!) 97.1 F (36.2 C) (Tympanic)   Resp 16   Wt 163 lb (73.9 kg)   BMI 28.87 kg/m  Lungs are clear to A&P cardiac examination essentially unremarkable with regular rate and rhythm. No dominant mass or nodularity is noted in either breast in 2 positions examined. Incision is well-healed. No axillary or supraclavicular adenopathy is appreciated. Cosmetic result is excellent.  Well-developed well-nourished patient in NAD. HEENT reveals PERLA, EOMI, discs not visualized.  Oral cavity is clear. No oral mucosal lesions are identified. Neck is clear without evidence of cervical or supraclavicular adenopathy. Lungs are clear to A&P. Cardiac examination is essentially unremarkable with regular rate and rhythm without murmur rub or thrill. Abdomen is benign with no organomegaly or masses noted. Motor sensory and DTR levels are equal and symmetric in the upper and lower extremities. Cranial nerves II through XII are grossly intact. Proprioception is  intact. No peripheral adenopathy or edema is identified. No motor or sensory levels are noted. Crude visual fields are within normal range.  RADIOLOGY RESULTS: Plain films of the ribs as well as mammogram were reviewed compatible with above-stated findings  PLAN: Present time patient is now out over 4 years with no evidence of disease.  And pleased with her overall progress.  I will turn follow-up care over to medical oncology.  I be happy to reevaluate patient anytime should that be indicated.  Patient knows to call with any concerns.  I would like to take this opportunity to thank you for allowing me to participate in the care of your patient.SABRA Marcey Penton, MD

## 2024-05-14 ENCOUNTER — Other Ambulatory Visit: Payer: Self-pay | Admitting: Cardiovascular Disease

## 2024-05-29 ENCOUNTER — Other Ambulatory Visit: Payer: Self-pay | Admitting: Internal Medicine

## 2024-06-01 ENCOUNTER — Other Ambulatory Visit: Payer: Self-pay

## 2024-06-01 MED ORDER — ACCU-CHEK GUIDE ME W/DEVICE KIT: 1.0000 | PACK | Freq: Two times a day (BID) | 0 refills | Status: DC

## 2024-06-01 NOTE — Telephone Encounter (Signed)
 Requested Prescriptions  Pending Prescriptions Disp Refills   fenofibrate  (TRICOR ) 145 MG tablet [Pharmacy Med Name: FENOFIBRATE  145 MG TABLET] 90 tablet 0    Sig: TAKE 1 TABLET BY MOUTH EVERY DAY     Cardiovascular:  Antilipid - Fibric Acid Derivatives Failed - 06/01/2024  9:37 AM      Failed - Cr in normal range and within 360 days    Creatinine  Date Value Ref Range Status  03/26/2024 1.2 (A) 0.5 - 1.1 Final   Creat  Date Value Ref Range Status  10/10/2023 1.13 (H) 0.60 - 0.95 mg/dL Final   Creatinine, Urine  Date Value Ref Range Status  10/10/2023 84 20 - 275 mg/dL Final         Failed - PLT in normal range and within 360 days    Platelets  Date Value Ref Range Status  10/10/2023 427 (H) 140 - 400 Thousand/uL Final  10/20/2020 438 150 - 450 x10E3/uL Final         Failed - Lipid Panel in normal range within the last 12 months    Cholesterol, Total  Date Value Ref Range Status  08/15/2015 274 (H) 100 - 199 mg/dL Final   Cholesterol  Date Value Ref Range Status  04/13/2024 115 <200 mg/dL Final   LDL Cholesterol (Calc)  Date Value Ref Range Status  04/13/2024 45 mg/dL (calc) Final    Comment:    Reference range: <100 . Desirable range <100 mg/dL for primary prevention;   <70 mg/dL for patients with CHD or diabetic patients  with > or = 2 CHD risk factors. SABRA LDL-C is now calculated using the Martin-Hopkins  calculation, which is a validated novel method providing  better accuracy than the Friedewald equation in the  estimation of LDL-C.  Gladis APPLETHWAITE et al. SANDREA. 7986;689(80): 2061-2068  (http://education.[facility].com/faq/FAQ164)    Direct LDL  Date Value Ref Range Status  07/01/2019 87.0 mg/dL Final    Comment:    Optimal:  <100 mg/dLNear or Above Optimal:  100-129 mg/dLBorderline High:  130-159 mg/dLHigh:  160-189 mg/dLVery High:  >190 mg/dL   HDL  Date Value Ref Range Status  04/13/2024 46 (L) > OR = 50 mg/dL Final  98/96/7982 38 (L) >39 mg/dL  Final   Triglycerides  Date Value Ref Range Status  04/13/2024 160 (H) <150 mg/dL Final         Passed - ALT in normal range and within 360 days    ALT  Date Value Ref Range Status  04/13/2024 13 6 - 29 U/L Final         Passed - AST in normal range and within 360 days    AST  Date Value Ref Range Status  04/13/2024 17 10 - 35 U/L Final         Passed - HGB in normal range and within 360 days    Hemoglobin  Date Value Ref Range Status  03/26/2024 12.8 12.0 - 16.0 Final  10/20/2020 14.5 11.1 - 15.9 g/dL Final         Passed - HCT in normal range and within 360 days    HCT  Date Value Ref Range Status  03/26/2024 38 36 - 46 Final   Hematocrit  Date Value Ref Range Status  10/20/2020 43.4 34.0 - 46.6 % Final         Passed - WBC in normal range and within 360 days    WBC  Date Value Ref Range Status  10/10/2023  9.0 3.8 - 10.8 Thousand/uL Final         Passed - eGFR is 30 or above and within 360 days    GFR calc Af Amer  Date Value Ref Range Status  12/08/2019 57 (L) >60 mL/min Final   GFR calc non Af Amer  Date Value Ref Range Status  12/08/2019 49 (L) >60 mL/min Final   GFR  Date Value Ref Range Status  07/14/2020 49.90 (L) >60.00 mL/min Final    Comment:    Calculated using the CKD-EPI Creatinine Equation (2021)   eGFR  Date Value Ref Range Status  03/26/2024 47  Final  07/10/2021 55 (L) >59 mL/min/1.73 Final         Passed - Valid encounter within last 12 months    Recent Outpatient Visits           1 month ago Encounter for general adult medical examination with abnormal findings   [facility] [place], [place] W, NP   4 months ago Rib pain on left side   [facility] [place], [place] W, NP   7 months ago Type 2 diabetes mellitus with stage 3a chronic kidney disease, without long-term current use of insulin  [facility])   [facility] [place], Angeline ORN, [place]

## 2024-06-02 ENCOUNTER — Telehealth: Payer: Self-pay | Admitting: Internal Medicine

## 2024-06-02 ENCOUNTER — Other Ambulatory Visit: Payer: Self-pay

## 2024-06-02 MED ORDER — ONETOUCH ULTRA VI STRP: ORAL_STRIP | 1 refills | Status: AC

## 2024-06-02 NOTE — Telephone Encounter (Signed)
 Not on current med list, unable to pend      Copied from CRM [id number]. Topic: Clinical - Prescription Issue >> Jun 02, 2024  9:06 AM Larissa RAMAN wrote: Reason for CRM: Patient's son, Alm, states prescription for Accu-check test strips were not sent to pharmacy. Requesting to have test strips sent to:    [id number] - [place], [place] - [address] AT [facility] [address] [place] [address] Phone: [phone] Fax: [phone] Hours: Not open 24 hours

## 2024-06-02 NOTE — Telephone Encounter (Signed)
 Left message for son prescription sent into pharmacy

## 2024-06-04 ENCOUNTER — Ambulatory Visit (INDEPENDENT_AMBULATORY_CARE_PROVIDER_SITE_OTHER): Payer: Medicare HMO

## 2024-06-04 DIAGNOSIS — Z0000 Encounter for general adult medical examination without abnormal findings: Secondary | ICD-10-CM

## 2024-06-04 NOTE — Progress Notes (Signed)
 Subjective:   Janice Liu is a 83 y.o. who presents for a Medicare Wellness preventive visit.  As a reminder, Annual Wellness Visits don't include a physical exam, and some assessments may be limited, especially if this visit is performed virtually. We may recommend an in-person follow-up visit with your provider if needed.  Visit Complete: Virtual I connected with  Janice Liu on 06/04/24 by a audio enabled telemedicine application and verified that I am speaking with the correct person using two identifiers.  Patient Location: Home  Provider Location: Home Office  I discussed the limitations of evaluation and management by telemedicine. The patient expressed understanding and agreed to proceed.  Vital Signs: Because this visit was a virtual/telehealth visit, some criteria may be missing or patient reported. Any vitals not documented were not able to be obtained and vitals that have been documented are patient reported.  VideoDeclined- This patient declined Librarian, academic. Therefore the visit was completed with audio only.  Persons Participating in Visit: Patient.  AWV Questionnaire: No: Patient Medicare AWV questionnaire was not completed prior to this visit.  Cardiac Risk Factors include: advanced age (>32men, >15 women);diabetes mellitus;dyslipidemia;hypertension;sedentary lifestyle     Objective:    There were no vitals filed for this visit. There is no height or weight on file to calculate BMI.     06/04/2024    2:19 PM 04/26/2024    2:14 PM 04/26/2024    1:46 PM 11/24/2023    1:59 PM 06/25/2023   10:39 AM 05/30/2023    1:55 PM 04/02/2023    2:07 PM  Advanced Directives  Does Patient Have a Medical Advance Directive? No Yes Yes Yes Yes No Yes  Type of Best boy of [place];Living will Healthcare Power of [place];Living will Healthcare Power of [place];Living will  Healthcare Power of [place];Living  will;Out of facility DNR (pink MOST or yellow form)  Does patient want to make changes to medical advance directive?   No - Patient declined    No - Patient declined  Copy of Healthcare Power of Attorney in Chart?   No - copy requested    No - copy requested  Would patient like information on creating a medical advance directive? No - Patient declined  No - Patient declined   No - Patient declined No - Patient declined    Current Medications (verified) Outpatient Encounter Medications as of 06/04/2024  Medication Sig   acetaminophen  (TYLENOL ) 500 MG tablet Take 500-1,000 mg by mouth every 6 (six) hours as needed (for pain.).   alendronate  (FOSAMAX ) 70 MG tablet TAKE 1 TABLET BY MOUTH ONCE A WEEK. TAKE WITH A FULL GLASS OF WATER ON AN EMPTY STOMACH.   aspirin  EC 81 MG tablet Take 1 tablet (81 mg total) by mouth daily. Swallow whole.   Blood Glucose Monitoring Suppl (ACCU-CHEK GUIDE ME) w/Device KIT 1 kit by Does not apply route 2 (two) times daily.   carvedilol  (COREG ) 25 MG tablet TAKE 1 TABLET BY MOUTH TWICE A DAY   fenofibrate  (TRICOR ) 145 MG tablet TAKE 1 TABLET BY MOUTH EVERY DAY   ferrous sulfate  325 (65 FE) MG tablet TAKE 1 TABLET BY MOUTH EVERY DAY   glucose blood (ONETOUCH ULTRA) test strip Use as instructed   letrozole  (FEMARA ) 2.5 MG tablet TAKE 1 TABLET BY MOUTH EVERY DAY   losartan  (COZAAR ) 100 MG tablet TAKE 1 TABLET BY MOUTH EVERY DAY   mupirocin  ointment (BACTROBAN ) 2 % Apply  1 Application topically 2 (two) times daily.   Omega-3 Fatty Acids (FISH OIL) 500 MG CAPS Take 500 mg by mouth daily.   rosuvastatin  (CRESTOR ) 40 MG tablet TAKE 1 TABLET BY MOUTH EVERY DAY   sitaGLIPtin  (JANUVIA ) 25 MG tablet Take 1 tablet (25 mg total) by mouth daily.   spironolactone  (ALDACTONE ) 25 MG tablet TAKE 1 TABLET (25 MG TOTAL) BY MOUTH DAILY.   Vitamin D , Ergocalciferol , (DRISDOL ) 1.25 MG (50000 UNIT) CAPS capsule Take 1 capsule (50,000 Units total) by mouth once a week. For 12 weeks. Then start  OTC Vitamin D3 2,000 unit daily.   Cephalexin  500 MG tablet Take 500 mg by mouth 2 (two) times daily. (Patient not taking: Reported on 06/04/2024)   tiZANidine  (ZANAFLEX ) 4 MG tablet Take 0.5 tablets (2 mg total) by mouth every 8 (eight) hours as needed for muscle spasms. (Patient not taking: Reported on 06/04/2024)   No facility-administered encounter medications on file as of 06/04/2024.    Allergies (verified) Patient has no allergy information on record.   History: Past Medical History:  Diagnosis Date   Allergy    Breast cancer (HCC)    2021   CAD (coronary artery disease)    Chicken pox    Diabetes mellitus (HCC)    Heart murmur    HFrEF (heart failure with reduced ejection fraction) (HCC)    Hypertension    Personal history of radiation therapy    Past Surgical History:  Procedure Laterality Date   BREAST BIOPSY Right 11/04/2019   Affirm Bx- positive- Ribbon Clip   BREAST BIOPSY Left 11/04/2019   Affirm Bx- neg- Coil Clip   BREAST BIOPSY Left 11/12/2019   us  bx/ ribbon clip/ path pending   BREAST CYST ASPIRATION Left 11/12/2019   2 areas   BREAST LUMPECTOMY Right 12/10/2019   Procedure: BREAST LUMPECTOMY;  Surgeon: Lane Shope, MD;  Location: [facility] ORS;  Service: General;  Laterality: Right;   CARDIAC CATHETERIZATION     CORONARY STENT INTERVENTION N/A 08/09/2019   Procedure: CORONARY STENT INTERVENTION;  Surgeon: Mady Bruckner, MD;  Location: [facility] INVASIVE CV LAB;  Service: Cardiovascular;  Laterality: N/A;   LEFT HEART CATH AND CORONARY ANGIOGRAPHY N/A 08/09/2019   Procedure: LEFT HEART CATH AND CORONARY ANGIOGRAPHY;  Surgeon: Mady Bruckner, MD;  Location: [facility] INVASIVE CV LAB;  Service: Cardiovascular;  Laterality: N/A;   Family History  Problem Relation Age of Onset   Heart failure Mother    Diabetes Mother    Hypertension Mother    Congestive Heart Failure Brother    Hypertension Brother    Cerebral palsy Son    Congestive Heart Failure Brother     Breast cancer Sister 75   Thyroid  cancer Sister    Social History   Socioeconomic History   Marital status: Widowed    Spouse name: Not on file   Number of children: 2   Years of education: Not on file   Highest education level: Not on file  Occupational History   Occupation: Retired  Tobacco Use   Smoking status: Never   Smokeless tobacco: Never  Vaping Use   Vaping status: Never Used  Substance and Sexual Activity   Alcohol use: No    Alcohol/week: 0.0 standard drinks of alcohol   Drug use: No   Sexual activity: Never  Other Topics Concern   Not on file  Social History Narrative   Not on file   Social Drivers of Health   Financial Resource Strain: Low Risk  (  06/04/2024)   Overall Financial Resource Strain (CARDIA)    Difficulty of Paying Living Expenses: Not hard at all  Food Insecurity: No Food Insecurity (06/04/2024)   Hunger Vital Sign    Worried About Running Out of Food in the Last Year: Never true    Ran Out of Food in the Last Year: Never true  Transportation Needs: No Transportation Needs (06/04/2024)   PRAPARE - Administrator, Civil Service (Medical): No    Lack of Transportation (Non-Medical): No  Physical Activity: Insufficiently Active (06/04/2024)   Exercise Vital Sign    Days of Exercise per Week: 3 days    Minutes of Exercise per Session: 30 min  Stress: No Stress Concern Present (06/04/2024)   [employer] of Occupational Health - Occupational Stress Questionnaire    Feeling of Stress: Not at all  Social Connections: Socially Isolated (06/04/2024)   Social Connection and Isolation Panel    Frequency of Communication with Friends and Family: Not on file    Frequency of Social Gatherings with Friends and Family: More than three times a week    Attends Religious Services: Never    Database administrator or Organizations: No    Attends Banker Meetings: Never    Marital Status: Widowed    Tobacco  Counseling Counseling given: Not Answered    Clinical Intake:  Pre-visit preparation completed: Yes  Pain : No/denies pain     BMI - recorded: 27.6 Nutritional Status: BMI 25 -29 Overweight Nutritional Risks: None Diabetes: Yes CBG done?: No Did pt. bring in CBG monitor from home?: No  Lab Results  Component Value Date   HGBA1C 6.9 (H) 04/13/2024   HGBA1C 7.1 (H) 10/10/2023   HGBA1C 6.8 (H) 04/10/2023     How often do you need to have someone help you when you read instructions, pamphlets, or other written materials from your doctor or pharmacy?: 1 - Never  Interpreter Needed?: No  Information entered by :: Janice DAS, LPN   Activities of Daily Living     06/04/2024    2:21 PM  In your present state of health, do you have any difficulty performing the following activities:  Hearing? 0  Vision? 0  Difficulty concentrating or making decisions? 0  Walking or climbing stairs? 0  Dressing or bathing? 0  Doing errands, shopping? 0  Preparing Food and eating ? N  Using the Toilet? N  In the past six months, have you accidently leaked urine? N  Do you have problems with loss of bowel control? N  Managing your Medications? N  Managing your Finances? N  Housekeeping or managing your Housekeeping? N    Patient Care Team: Antonette Angeline ORN, NP as PCP - General (Internal Medicine) Darron Deatrice LABOR, MD as PCP - Cardiology (Cardiology) Jacobo Evalene PARAS, MD as Consulting Physician (Oncology) Lenn Aran, MD as Referring Physician (Radiation Oncology) Lane Shope, MD as Consulting Physician (General Surgery) Cindie Jesusa HERO, RN as Registered Nurse  I have updated your Care Teams any recent Medical Services you may have received from other providers in the past year.     Assessment:   This is a routine wellness examination for Janice Liu.  Hearing/Vision screen Hearing Screening - Comments:: NO AIDS Vision Screening - Comments:: READERS- NO MD   Goals  Addressed             This Visit's Progress    DIET - INCREASE WATER INTAKE  Depression Screen     06/04/2024    2:17 PM 04/26/2024    1:47 PM 04/13/2024    1:36 PM 01/13/2024   11:18 AM 10/10/2023    2:31 PM 05/30/2023    1:53 PM 04/10/2023    9:21 AM  PHQ 2/9 Scores  PHQ - 2 Score 0 0 0 0 6 0 0  PHQ- 9 Score 0  2 3 11  0     Fall Risk     06/04/2024    2:20 PM 04/13/2024    1:36 PM 01/13/2024   11:18 AM 10/10/2023    2:31 PM 05/30/2023    1:56 PM  Fall Risk   Falls in the past year? 1 1 1  0 0  Number falls in past yr: 0 0 0  0  Injury with Fall? 0 1 1 0 0  Comment BRUISED RIBS      Risk for fall due to : History of fall(s)    No Fall Risks  Follow up Falls evaluation completed;Falls prevention discussed    Falls prevention discussed;Falls evaluation completed    MEDICARE RISK AT HOME:  Medicare Risk at Home Any stairs in or around the home?: Yes If so, are there any without handrails?: No Home free of loose throw rugs in walkways, pet beds, electrical cords, etc?: Yes Adequate lighting in your home to reduce risk of falls?: Yes Life alert?: No Use of a cane, walker or w/c?: No Grab bars in the bathroom?: No Shower chair or bench in shower?: Yes Elevated toilet seat or a handicapped toilet?: No  TIMED UP AND GO:  Was the test performed?  No  Cognitive Function: 6CIT completed        06/04/2024    2:23 PM 05/30/2023    1:57 PM 05/27/2022    1:57 PM  6CIT Screen  What Year? 0 points 0 points 0 points  What month? 0 points 0 points 0 points  What time? 0 points 0 points 0 points  Count back from 20 2 points 2 points 0 points  Months in reverse 2 points 0 points 0 points  Repeat phrase 8 points 2 points 0 points  Total Score 12 points 4 points 0 points    Immunizations Immunization History  Administered Date(s) Administered   Fluad Quad(high Dose 65+) 06/07/2020, 05/03/2022   Fluad Trivalent(High Dose 65+) 04/10/2023   INFLUENZA, HIGH DOSE  SEASONAL PF 05/21/2019, 04/13/2024   Influenza,inj,Quad PF,6+ Mos 07/31/2017, 06/09/2018   Influenza-Unspecified 05/19/2021   PFIZER(Purple Top)SARS-COV-2 Vaccination 10/04/2019, 10/25/2019, 06/10/2020, 01/18/2021, 05/28/2023   Pfizer Covid-19 Vaccine Bivalent Booster 2yrs & up 05/19/2021   Pfizer(Comirnaty)Fall Seasonal Vaccine 12 years and older 05/28/2023   Pneumococcal Conjugate-13 03/15/2021   Pneumococcal Polysaccharide-23 03/21/2022   Unspecified SARS-COV-2 Vaccination 05/03/2022    Screening Tests Health Maintenance  Topic Date Due   DTaP/Tdap/Td (1 - Tdap) Never done   OPHTHALMOLOGY EXAM  03/29/2022   Zoster Vaccines- Shingrix (1 of 2) 07/13/2024 (Originally 11/09/1959)   Diabetic kidney evaluation - Urine ACR  10/09/2024   HEMOGLOBIN A1C  10/11/2024   Diabetic kidney evaluation - eGFR measurement  03/26/2025   FOOT EXAM  04/13/2025   Mammogram  04/19/2025   Medicare Annual Wellness (AWV)  06/04/2025   Pneumococcal Vaccine: 50+ Years  Completed   Influenza Vaccine  Completed   DEXA SCAN  Completed   Meningococcal B Vaccine  Aged Out   COVID-19 Vaccine  Discontinued    Health Maintenance Items Addressed: NEEDS SHINGRIX,  TDAP; UP TO DATE ON MAMMOGRAM & BDS, AGED OUT OF COLONOSCOPY  Additional Screening:  Vision Screening: Recommended annual ophthalmology exams for early detection of glaucoma and other disorders of the eye. Is the patient up to date with their annual eye exam?  No  Who is the provider or what is the name of the office in which the patient attends annual eye exams? NO ONE  Dental Screening: Recommended annual dental exams for proper oral hygiene  Community Resource Referral / Chronic Care Management: CRR required this visit?  No   CCM required this visit?  No   Plan:    I have personally reviewed and noted the following in the patient's chart:   Medical and social history Use of alcohol, tobacco or illicit drugs  Current medications and  supplements including opioid prescriptions. Patient is not currently taking opioid prescriptions. Functional ability and status Nutritional status Physical activity Advanced directives List of other physicians Hospitalizations, surgeries, and ER visits in previous 12 months Vitals Screenings to include cognitive, depression, and falls Referrals and appointments  In addition, I have reviewed and discussed with patient certain preventive protocols, quality metrics, and best practice recommendations. A written personalized care plan for preventive services as well as general preventive health recommendations were provided to patient.   Janice GORMAN Das, LPN   [id number]/75/7974   After Visit Summary: (MyChart) Due to this being a telephonic visit, the after visit summary with patients personalized plan was offered to patient via MyChart   Notes: Nothing significant to report at this time.

## 2024-06-04 NOTE — Patient Instructions (Addendum)
 Janice Liu,  Thank you for taking the time for your Medicare Wellness Visit. I appreciate your continued commitment to your health goals. Please review the care plan we discussed, and feel free to reach out if I can assist you further.  Medicare recommends these wellness visits once per year to help you and your care team stay ahead of potential health issues. These visits are designed to focus on prevention, allowing your provider to concentrate on managing your acute and chronic conditions during your regular appointments.  Please note that Annual Wellness Visits do not include a physical exam. Some assessments may be limited, especially if the visit was conducted virtually. If needed, we may recommend a separate in-person follow-up with your provider.  Ongoing Care Seeing your primary care provider every 3 to 6 months helps us  monitor your health and provide consistent, personalized care.   Referrals If a referral was made during today's visit and you haven't received any updates within two weeks, please contact the referred provider directly to check on the status.  Recommended Screenings:  Health Maintenance  Topic Date Due   DTaP/Tdap/Td vaccine (1 - Tdap) Never done   Eye exam for diabetics  03/29/2022   Zoster (Shingles) Vaccine (1 of 2) 07/13/2024*   Yearly kidney health urinalysis for diabetes  10/09/2024   Hemoglobin A1C  10/11/2024   Yearly kidney function blood test for diabetes  03/26/2025   Complete foot exam   04/13/2025   Breast Cancer Screening  04/19/2025   Medicare Annual Wellness Visit  06/04/2025   Pneumococcal Vaccine for age over 75  Completed   Flu Shot  Completed   DEXA scan (bone density measurement)  Completed   Meningitis B Vaccine  Aged Out   COVID-19 Vaccine  Discontinued  *Topic was postponed. The date shown is not the original due date.     Advance Care Planning is important because it: Ensures you receive medical care that aligns with your  values, goals, and preferences. Provides guidance to your family and loved ones, reducing the emotional burden of decision-making during critical moments.  Vision: Annual vision screenings are recommended for early detection of glaucoma, cataracts, and diabetic retinopathy. These exams can also reveal signs of chronic conditions such as diabetes and high blood pressure.  Dental: Annual dental screenings help detect early signs of oral cancer, gum disease, and other conditions linked to overall health, including heart disease and diabetes.  Please see the attached documents for additional preventive care recommendations.   NEXT AWV 06/10/25 @ 1:20 PM IN PERSON

## 2024-06-05 ENCOUNTER — Other Ambulatory Visit: Payer: Self-pay | Admitting: Cardiovascular Disease

## 2024-06-29 ENCOUNTER — Other Ambulatory Visit: Payer: Self-pay | Admitting: Internal Medicine

## 2024-07-01 NOTE — Telephone Encounter (Signed)
 Requested medications are due for refill today.  Unsure  - pt was to complete this and then start a lower daily dose  Requested medications are on the active medications list.  yes  Last refill. 04/13/2024 #12 0 rf  Future visit scheduled.   yes  Notes to clinic.  Provider to review at this dosage.    Requested Prescriptions  Pending Prescriptions Disp Refills   Vitamin D , Ergocalciferol , (DRISDOL ) 1.25 MG (50000 UNIT) CAPS capsule [Pharmacy Med Name: VITAMIN D2 1.25MG (50,000 UNIT)] 12 capsule 0    Sig: Take 1 capsule (50,000 Units total) by mouth once a week. For 12 weeks. Then start OTC Vitamin D3 2,000 unit daily.     Endocrinology:  Vitamins - Vitamin D  Supplementation 2 Failed - 07/01/2024  5:49 PM      Failed - Manual Review: Route requests for 50,000 IU strength to the provider      Failed - Vitamin D  in normal range and within 360 days    VITD  Date Value Ref Range Status  10/14/2019 55.96 30.00 - 100.00 ng/mL Final         Passed - Ca in normal range and within 360 days    Calcium   Date Value Ref Range Status  10/10/2023 9.7 8.6 - 10.4 mg/dL Final         Passed - Valid encounter within last 12 months    Recent Outpatient Visits           2 months ago Encounter for general adult medical examination with abnormal findings   [place] [facility] [place], [place] W, NP   5 months ago Rib pain on left side   [place] [facility] [place], [place], NP   8 months ago Type 2 diabetes mellitus with stage 3a chronic kidney disease, without long-term current use of insulin  [facility])   [facility] [place], [place]N, [place]

## 2024-08-02 ENCOUNTER — Encounter: Payer: Self-pay | Admitting: Pharmacist

## 2024-08-02 ENCOUNTER — Other Ambulatory Visit: Payer: Self-pay | Admitting: Internal Medicine

## 2024-08-02 NOTE — Progress Notes (Signed)
" ° °  08/02/2024  Patient ID: Ronal FORBES Mckusick, female   DOB: 05-20-1941, 83 y.o.   MRN: [id number]  This patient is appearing on a report for being at risk of failing the adherence measure for diabetes medications this calendar year.   Medication: Januvia  25 mg Last fill date: 07/15/2024 for 90 day supply  Insurance report was not up to date. No action needed at this time.   Sharyle Sia, PharmD, [facility] [phone]   "

## 2024-08-04 NOTE — Telephone Encounter (Signed)
 Requested Prescriptions  Pending Prescriptions Disp Refills   Blood Glucose Monitoring Suppl (ACCU-CHEK GUIDE ME) w/Device KIT [Pharmacy Med Name: ACCU-CHEK GUIDE ME GLUCOSE MTR] 1 kit 0    Sig: 1 KIT BY DOES NOT APPLY ROUTE 2 (TWO) TIMES DAILY.     Endocrinology: Diabetes - Testing Supplies Passed - 08/04/2024  2:05 PM      Passed - Valid encounter within last 12 months    Recent Outpatient Visits           3 months ago Encounter for general adult medical examination with abnormal findings   [place] [facility] [place], [place], NP   6 months ago Rib pain on left side   [place] [facility] [place], [place], NP   9 months ago Type 2 diabetes mellitus with stage 3a chronic kidney disease, without long-term current use of insulin  [facility])   [facility] [place], [place], [place]

## 2024-08-26 ENCOUNTER — Other Ambulatory Visit: Payer: Self-pay | Admitting: Internal Medicine

## 2024-08-27 NOTE — Telephone Encounter (Signed)
 Requested Prescriptions  Pending Prescriptions Disp Refills   fenofibrate  (TRICOR ) 145 MG tablet [Pharmacy Med Name: FENOFIBRATE  145 MG TABLET] 90 tablet 0    Sig: TAKE 1 TABLET BY MOUTH EVERY DAY     Cardiovascular:  Antilipid - Fibric Acid Derivatives Failed - 08/27/2024  7:52 AM      Failed - Cr in normal range and within 360 days    Creatinine  Date Value Ref Range Status  03/26/2024 1.2 (A) 0.5 - 1.1 Final   Creat  Date Value Ref Range Status  10/10/2023 1.13 (H) 0.60 - 0.95 mg/dL Final   Creatinine, Urine  Date Value Ref Range Status  10/10/2023 84 20 - 275 mg/dL Final         Failed - PLT in normal range and within 360 days    Platelets  Date Value Ref Range Status  10/10/2023 427 (H) 140 - 400 Thousand/uL Final  10/20/2020 438 150 - 450 x10E3/uL Final         Failed - Lipid Panel in normal range within the last 12 months    Cholesterol, Total  Date Value Ref Range Status  08/15/2015 274 (H) 100 - 199 mg/dL Final   Cholesterol  Date Value Ref Range Status  04/13/2024 115 <200 mg/dL Final   LDL Cholesterol (Calc)  Date Value Ref Range Status  04/13/2024 45 mg/dL (calc) Final    Comment:    Reference range: <100 . Desirable range <100 mg/dL for primary prevention;   <70 mg/dL for patients with CHD or diabetic patients  with > or = 2 CHD risk factors. SABRA LDL-C is now calculated using the Martin-Hopkins  calculation, which is a validated novel method providing  better accuracy than the Friedewald equation in the  estimation of LDL-C.  Gladis APPLETHWAITE et al. SANDREA. 7986;689(80): 2061-2068  (http://education.[employer].com/faq/FAQ164)    Direct LDL  Date Value Ref Range Status  07/01/2019 87.0 mg/dL Final    Comment:    Optimal:  <100 mg/dLNear or Above Optimal:  100-129 mg/dLBorderline High:  130-159 mg/dLHigh:  160-189 mg/dLVery High:  >190 mg/dL   HDL  Date Value Ref Range Status  04/13/2024 46 (L) > OR = 50 mg/dL Final  98/96/7982 38 (L) >39 mg/dL  Final   Triglycerides  Date Value Ref Range Status  04/13/2024 160 (H) <150 mg/dL Final         Passed - ALT in normal range and within 360 days    ALT  Date Value Ref Range Status  04/13/2024 13 6 - 29 U/L Final         Passed - AST in normal range and within 360 days    AST  Date Value Ref Range Status  04/13/2024 17 10 - 35 U/L Final         Passed - HGB in normal range and within 360 days    Hemoglobin  Date Value Ref Range Status  03/26/2024 12.8 12.0 - 16.0 Final  10/20/2020 14.5 11.1 - 15.9 g/dL Final         Passed - HCT in normal range and within 360 days    HCT  Date Value Ref Range Status  03/26/2024 38 36 - 46 Final   Hematocrit  Date Value Ref Range Status  10/20/2020 43.4 34.0 - 46.6 % Final         Passed - WBC in normal range and within 360 days    WBC  Date Value Ref Range Status  10/10/2023  9.0 3.8 - 10.8 Thousand/uL Final         Passed - eGFR is 30 or above and within 360 days    GFR calc Af Amer  Date Value Ref Range Status  12/08/2019 57 (L) >60 mL/min Final   GFR calc non Af Amer  Date Value Ref Range Status  12/08/2019 49 (L) >60 mL/min Final   GFR  Date Value Ref Range Status  07/14/2020 49.90 (L) >60.00 mL/min Final    Comment:    Calculated using the CKD-EPI Creatinine Equation (2021)   eGFR  Date Value Ref Range Status  03/26/2024 47  Final  07/10/2021 55 (L) >59 mL/min/1.73 Final         Passed - Valid encounter within last 12 months    Recent Outpatient Visits           4 months ago Encounter for general adult medical examination with abnormal findings   [place] [facility] [place], [place], NP   7 months ago Rib pain on left side   [place] [facility] [place], [place], NP   10 months ago Type 2 diabetes mellitus with stage 3a chronic kidney disease, without long-term current use of insulin  [facility])   [place] [facility] [place], [place], [place]

## 2024-10-11 ENCOUNTER — Ambulatory Visit: Admitting: Internal Medicine

## 2024-10-25 ENCOUNTER — Ambulatory Visit: Admitting: Oncology

## 2024-10-27 ENCOUNTER — Inpatient Hospital Stay: Admitting: Oncology

## 2025-06-10 ENCOUNTER — Ambulatory Visit
